# Patient Record
Sex: Female | Born: 1950 | Race: Black or African American | Hispanic: No | Smoking: Former smoker
Health system: Southern US, Community
[De-identification: ages and names within clinical notes are randomized; demographics above are authoritative.]

## PROBLEM LIST (undated history)

## (undated) DIAGNOSIS — Z972 Presence of dental prosthetic device (complete) (partial): Secondary | ICD-10-CM

## (undated) DIAGNOSIS — Z973 Presence of spectacles and contact lenses: Secondary | ICD-10-CM

## (undated) DIAGNOSIS — R911 Solitary pulmonary nodule: Secondary | ICD-10-CM

## (undated) DIAGNOSIS — E785 Hyperlipidemia, unspecified: Secondary | ICD-10-CM

## (undated) DIAGNOSIS — I2510 Atherosclerotic heart disease of native coronary artery without angina pectoris: Secondary | ICD-10-CM

## (undated) DIAGNOSIS — L814 Other melanin hyperpigmentation: Secondary | ICD-10-CM

## (undated) DIAGNOSIS — D649 Anemia, unspecified: Secondary | ICD-10-CM

## (undated) DIAGNOSIS — I10 Essential (primary) hypertension: Secondary | ICD-10-CM

## (undated) HISTORY — DX: Hyperlipidemia, unspecified: E78.5

## (undated) HISTORY — PX: FOOT SURGERY: SHX648

## (undated) HISTORY — PX: ABDOMINAL HYSTERECTOMY: SHX81

## (undated) HISTORY — PX: MULTIPLE TOOTH EXTRACTIONS: SHX2053

## (undated) HISTORY — DX: Essential (primary) hypertension: I10

## (undated) HISTORY — PX: COLONOSCOPY W/ BIOPSIES AND POLYPECTOMY: SHX1376

## (undated) HISTORY — PX: DILATION AND CURETTAGE OF UTERUS: SHX78

## (undated) HISTORY — DX: Atherosclerotic heart disease of native coronary artery without angina pectoris: I25.10

---

## 1972-04-15 HISTORY — PX: ABDOMINAL HYSTERECTOMY: SHX81

## 1999-12-31 ENCOUNTER — Other Ambulatory Visit: Admission: RE | Admit: 1999-12-31 | Discharge: 1999-12-31 | Payer: Self-pay | Admitting: Internal Medicine

## 2000-01-26 ENCOUNTER — Emergency Department (HOSPITAL_COMMUNITY): Admission: EM | Admit: 2000-01-26 | Discharge: 2000-01-27 | Payer: Self-pay | Admitting: Emergency Medicine

## 2000-11-03 ENCOUNTER — Other Ambulatory Visit: Admission: RE | Admit: 2000-11-03 | Discharge: 2000-11-03 | Payer: Self-pay | Admitting: Internal Medicine

## 2004-02-07 ENCOUNTER — Emergency Department (HOSPITAL_COMMUNITY): Admission: EM | Admit: 2004-02-07 | Discharge: 2004-02-07 | Payer: Self-pay | Admitting: Emergency Medicine

## 2005-02-16 ENCOUNTER — Emergency Department (HOSPITAL_COMMUNITY): Admission: EM | Admit: 2005-02-16 | Discharge: 2005-02-16 | Payer: Self-pay | Admitting: Emergency Medicine

## 2007-11-21 ENCOUNTER — Emergency Department (HOSPITAL_COMMUNITY): Admission: EM | Admit: 2007-11-21 | Discharge: 2007-11-21 | Payer: Self-pay | Admitting: Emergency Medicine

## 2008-01-11 ENCOUNTER — Encounter: Admission: RE | Admit: 2008-01-11 | Discharge: 2008-01-11 | Payer: Self-pay | Admitting: Internal Medicine

## 2009-07-14 ENCOUNTER — Ambulatory Visit (HOSPITAL_COMMUNITY): Admission: RE | Admit: 2009-07-14 | Discharge: 2009-07-14 | Payer: Self-pay | Admitting: Internal Medicine

## 2010-06-14 ENCOUNTER — Inpatient Hospital Stay (INDEPENDENT_AMBULATORY_CARE_PROVIDER_SITE_OTHER)
Admission: RE | Admit: 2010-06-14 | Discharge: 2010-06-14 | Disposition: A | Discharge status: Home / Self Care | Payer: Self-pay | Source: Ambulatory Visit | Attending: Emergency Medicine | Admitting: Emergency Medicine

## 2010-06-14 DIAGNOSIS — N3000 Acute cystitis without hematuria: Secondary | ICD-10-CM

## 2010-06-14 LAB — POCT URINALYSIS DIPSTICK
Ketones, ur: NEGATIVE mg/dL
Nitrite: NEGATIVE
Protein, ur: NEGATIVE mg/dL
Specific Gravity, Urine: <=1.005 (ref 1.005–1.030)
Urine Glucose, Fasting: NEGATIVE mg/dL
Urobilinogen, UA: 0.2 mg/dL (ref 0.0–1.0)
pH: 5.5 (ref 5.0–8.0)

## 2011-09-11 ENCOUNTER — Ambulatory Visit (INDEPENDENT_AMBULATORY_CARE_PROVIDER_SITE_OTHER): Payer: Self-pay | Admitting: Family Medicine

## 2011-09-11 ENCOUNTER — Encounter: Payer: Self-pay | Admitting: Family Medicine

## 2011-09-11 VITALS — BP 159/87 | HR 67 | Temp 98.1°F | Ht 65.75 in | Wt 173.9 lb

## 2011-09-11 DIAGNOSIS — I10 Essential (primary) hypertension: Secondary | ICD-10-CM | POA: Insufficient documentation

## 2011-09-11 DIAGNOSIS — H9190 Unspecified hearing loss, unspecified ear: Secondary | ICD-10-CM | POA: Insufficient documentation

## 2011-09-11 DIAGNOSIS — F17200 Nicotine dependence, unspecified, uncomplicated: Secondary | ICD-10-CM

## 2011-09-11 DIAGNOSIS — Z5111 Encounter for antineoplastic chemotherapy: Secondary | ICD-10-CM | POA: Insufficient documentation

## 2011-09-11 DIAGNOSIS — Z720 Tobacco use: Secondary | ICD-10-CM

## 2011-09-11 DIAGNOSIS — Z0000 Encounter for general adult medical examination without abnormal findings: Secondary | ICD-10-CM | POA: Insufficient documentation

## 2011-09-11 MED ORDER — LISINOPRIL 10 MG PO TABS: 5.0000 mg | ORAL_TABLET | Freq: Every day | ORAL | Status: DC

## 2011-09-11 NOTE — Assessment & Plan Note (Signed)
Advised ent/audiology referral.  No evidence for intracranial etiology.  Patient declines as is uninsured.

## 2011-09-11 NOTE — Assessment & Plan Note (Signed)
Not interested in quitting at this time

## 2011-09-11 NOTE — Assessment & Plan Note (Signed)
Manual recheck at goal.  Will  Continue lisinopril 5 mg as she has been taking. Will check labs as soon as debra hill approved.

## 2011-09-11 NOTE — Patient Instructions (Addendum)
See info for getting mammogram  Consider no more than 2 alcoholic drinks in a sitting  [phone] QUIT-NOW or schedule appt with Dr. Raymondo Band for smoking cessation  Make appointment for fasting bloodwork so we can check your cholesterol, test for diabetes  Follow-up every 6 months

## 2011-09-11 NOTE — Progress Notes (Signed)
  Subjective:    Patient ID: Brandi Holmes, female    DOB: October 27, 1950, 61 y.o.   MRN: [id number]  HPInew patient, here to establish care of hypertension.  Appt with Jaynee Eagles pending  Hearing loss:  Several months of decreased hearing unilaterally.  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  Sounds muffled- no tinnitus.  No ear drainage. No history of ear surgery.  No loud noise jobs or exposure.  HYPERTENSION  BP Readings from Last 3 Encounters:  09/11/11 159/87    Hypertension ROS: taking medications as instructed, no medication side effects noted, no TIA's, no chest pain on exertion, no dyspnea on exertion, no swelling of ankles and no intermittent claudication symptoms.     Review of Systems See HPI   Patient Information Form: Screening and ROS  AUDIT-C Score: 3, counseled on healthy levels of alcohol use. Do you feel safe in relationships? yes PHQ-2:negative  Review of Symptoms  General:  Negative for nexplained weight loss, fever Skin: Negative for new or changing mole, sore that won't heal HEENT: Negative for trouble hearing, trouble seeing, ringing in ears, mouth sores, hoarseness, change in voice, dysphagia. CV:  Negative for chest pain, dyspnea, edema, palpitations Resp: Negative for cough, dyspnea, hemoptysis GI: Negative for nausea, vomiting, diarrhea, constipation, abdominal pain, melena, hematochezia. GU: Negative for dysuria, incontinence, urinary hesitance, hematuria, vaginal or penile discharge, polyuria, sexual difficulty, lumps in testicle or breasts MSK: Negative for muscle cramps or aches, joint pain or swelling Neuro: Negative for headaches, weakness, numbness, dizziness, passing out/fainting Psych: Negative for depression, anxiety, memory problems  Positive for polyuria, decreased hearing Objective:   Physical Exam GEN: Alert & Oriented, No acute distress HEENT: [place]/AT.  EOMI, PERRLA, no conjunctival injection or scleral icterus.  Bilateral tympanic membranes intact without erythema or effusion.  .  Nares without edema or rhinorrhea.  Oropharynx is without erythema or exudates.  No anterior or posterior cervical lymphadenopathy. CV:  Regular Rate & Rhythm, no murmur Respiratory:  Normal work of breathing, CTAB Abd:  + BS, soft, no tenderness to palpation Ext: no pre-tibial edema  unabel to hear rubbing fingers behind her head.  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       Assessment & Plan:

## 2011-09-16 ENCOUNTER — Other Ambulatory Visit: Payer: Self-pay

## 2011-09-16 DIAGNOSIS — I10 Essential (primary) hypertension: Secondary | ICD-10-CM

## 2011-09-16 DIAGNOSIS — D649 Anemia, unspecified: Secondary | ICD-10-CM

## 2011-09-16 LAB — COMPREHENSIVE METABOLIC PANEL
ALT: 9 U/L (ref 0–35)
AST: 13 U/L (ref 0–37)
Albumin: 4 g/dL (ref 3.5–5.2)
Alkaline Phosphatase: 50 U/L (ref 39–117)
BUN: 11 mg/dL (ref 6–23)
CO2: 27 mEq/L (ref 19–32)
Calcium: 9.3 mg/dL (ref 8.4–10.5)
Creat: 0.91 mg/dL (ref 0.50–1.10)
Glucose, Bld: 95 mg/dL (ref 70–99)
Potassium: 4.4 mEq/L (ref 3.5–5.3)
Sodium: 142 mEq/L (ref 135–145)
Total Bilirubin: 0.8 mg/dL (ref 0.3–1.2)

## 2011-09-16 LAB — CBC
HCT: 39.2 % (ref 36.0–46.0)
Hemoglobin: 13.1 g/dL (ref 12.0–15.0)
MCH: 28.9 pg (ref 26.0–34.0)
MCHC: 33.4 g/dL (ref 30.0–36.0)
MCV: 86.3 fL (ref 78.0–100.0)
Platelets: 309 K/uL (ref 150–400)
RBC: 4.54 MIL/uL (ref 3.87–5.11)
RDW: 13.5 % (ref 11.5–15.5)
WBC: 11 K/uL — ABNORMAL HIGH (ref 4.0–10.5)

## 2011-09-16 LAB — LIPID PANEL
Cholesterol: 165 mg/dL (ref 0–200)
HDL: 45 mg/dL
LDL Cholesterol: 103 mg/dL — ABNORMAL HIGH (ref 0–99)
Total CHOL/HDL Ratio: 3.7 ratio
Triglycerides: 84 mg/dL
VLDL: 17 mg/dL (ref 0–40)

## 2011-09-16 NOTE — Progress Notes (Signed)
CBC,CMP AND FLP DONE TODAY Brandi Holmes 

## 2011-09-23 ENCOUNTER — Encounter: Payer: Self-pay | Admitting: Family Medicine

## 2011-09-23 DIAGNOSIS — D649 Anemia, unspecified: Secondary | ICD-10-CM | POA: Insufficient documentation

## 2011-09-23 NOTE — Assessment & Plan Note (Signed)
Per patient recent normal colonoscopy,  Mammogram pending.  Will continue to follow

## 2013-06-30 ENCOUNTER — Other Ambulatory Visit (HOSPITAL_COMMUNITY): Payer: Self-pay | Admitting: Internal Medicine

## 2013-06-30 DIAGNOSIS — Z1231 Encounter for screening mammogram for malignant neoplasm of breast: Secondary | ICD-10-CM

## 2013-07-02 ENCOUNTER — Ambulatory Visit (HOSPITAL_COMMUNITY): Payer: Medicare HMO

## 2013-08-12 ENCOUNTER — Encounter (INDEPENDENT_AMBULATORY_CARE_PROVIDER_SITE_OTHER): Payer: Self-pay

## 2013-08-12 ENCOUNTER — Ambulatory Visit (HOSPITAL_COMMUNITY)
Admission: RE | Admit: 2013-08-12 | Discharge: 2013-08-12 | Disposition: A | Discharge status: Home / Self Care | Payer: Medicare HMO | Source: Ambulatory Visit | Attending: Internal Medicine | Admitting: Internal Medicine

## 2013-08-12 DIAGNOSIS — Z1231 Encounter for screening mammogram for malignant neoplasm of breast: Secondary | ICD-10-CM

## 2014-06-27 ENCOUNTER — Emergency Department (INDEPENDENT_AMBULATORY_CARE_PROVIDER_SITE_OTHER)
Admission: EM | Admit: 2014-06-27 | Discharge: 2014-06-27 | Disposition: A | Discharge status: Home / Self Care | Payer: Commercial Managed Care - HMO | Source: Home / Self Care | Attending: Family Medicine | Admitting: Family Medicine

## 2014-06-27 ENCOUNTER — Encounter (HOSPITAL_COMMUNITY): Payer: Self-pay | Admitting: Emergency Medicine

## 2014-06-27 DIAGNOSIS — W57XXXA Bitten or stung by nonvenomous insect and other nonvenomous arthropods, initial encounter: Secondary | ICD-10-CM

## 2014-06-27 DIAGNOSIS — T148 Other injury of unspecified body region: Secondary | ICD-10-CM

## 2014-06-27 MED ORDER — DOXYCYCLINE HYCLATE 100 MG PO CAPS: 100.0000 mg | ORAL_CAPSULE | Freq: Two times a day (BID) | ORAL | Status: DC

## 2014-06-27 NOTE — Discharge Instructions (Signed)
Thank you for coming in today.  Tick Bite Information Ticks are insects that attach themselves to the skin and draw blood for food. There are various types of ticks. Common types include wood ticks and deer ticks. Most ticks live in shrubs and grassy areas. Ticks can climb onto your body when you make contact with leaves or grass where the tick is waiting. The most common places on the body for ticks to attach themselves are the scalp, neck, armpits, waist, and groin. Most tick bites are harmless, but sometimes ticks carry germs that cause diseases. These germs can be spread to a person during the tick's feeding process. The chance of a disease spreading through a tick bite depends on:   The type of tick.  Time of year.   How long the tick is attached.   Geographic location.  HOW CAN YOU PREVENT TICK BITES? Take these steps to help prevent tick bites when you are outdoors:  Wear protective clothing. Long sleeves and long pants are best.   Wear white clothes so you can see ticks more easily.  Tuck your pant legs into your socks.   If walking on a trail, stay in the middle of the trail to avoid brushing against bushes.  Avoid walking through areas with long grass.  Put insect repellent on all exposed skin and along boot tops, pant legs, and sleeve cuffs.   Check clothing, hair, and skin repeatedly and before going inside.   Brush off any ticks that are not attached.  Take a shower or bath as soon as possible after being outdoors.  WHAT IS THE PROPER WAY TO REMOVE A TICK? Ticks should be removed as soon as possible to help prevent diseases caused by tick bites. 1. If latex gloves are available, put them on before trying to remove a tick.  2. Using fine-point tweezers, grasp the tick as close to the skin as possible. You may also use curved forceps or a tick removal tool. Grasp the tick as close to its head as possible. Avoid grasping the tick on its body. 3. Pull gently  with steady upward pressure until the tick lets go. Do not twist the tick or jerk it suddenly. This may break off the tick's head or mouth parts. 4. Do not squeeze or crush the tick's body. This could force disease-carrying fluids from the tick into your body.  5. After the tick is removed, wash the bite area and your hands with soap and water or other disinfectant such as alcohol. 6. Apply a small amount of antiseptic cream or ointment to the bite site.  7. Wash and disinfect any instruments that were used.  Do not try to remove a tick by applying a hot match, petroleum jelly, or fingernail polish to the tick. These methods do not work and may increase the chances of disease being spread from the tick bite.  WHEN SHOULD YOU SEEK MEDICAL CARE? Contact your health care provider if you are unable to remove a tick from your skin or if a part of the tick breaks off and is stuck in the skin.  After a tick bite, you need to be aware of signs and symptoms that could be related to diseases spread by ticks. Contact your health care provider if you develop any of the following in the days or weeks after the tick bite:  Unexplained fever.  Rash. A circular rash that appears days or weeks after the tick bite may indicate the possibility  of Lyme disease. The rash may resemble a target with a bull's-eye and may occur at a different part of your body than the tick bite.  Redness and swelling in the area of the tick bite.   Tender, swollen lymph glands.   Diarrhea.   Weight loss.   Cough.   Fatigue.   Muscle, joint, or bone pain.   Abdominal pain.   Headache.   Lethargy or a change in your level of consciousness.  Difficulty walking or moving your legs.   Numbness in the legs.   Paralysis.  Shortness of breath.   Confusion.   Repeated vomiting.  Document Released: 03/29/2000 Document Revised: 01/20/2013 Document Reviewed: 09/09/2012 [facility] Patient Information 2015  [place], [place]. This information is not intended to replace advice given to you by your health care provider. Make sure you discuss any questions you have with your health care provider.

## 2014-06-27 NOTE — ED Notes (Signed)
Pt has a tick on her mid back that needs to be removed.  Pt denies any concerning symptoms.

## 2014-06-27 NOTE — ED Provider Notes (Signed)
Brandi Holmes is a 64 y.o. female who presents to Urgent Care today for tick bite. Patient discovered a tick on her back today. She thinks she was exposed Friday when she was tending to some [place]. She denies any fevers or chills vomiting or diarrhea. She's tried putting rubbing alcohol on the tach which did not help.   Past Medical History  Diagnosis Date  . Hypertension    Past Surgical History  Procedure Laterality Date  . Abdominal hysterectomy     History  Substance Use Topics  . Smoking status: Light Tobacco Smoker  . Smokeless tobacco: Never Used  . Alcohol Use: Yes     Comment: occasional   ROS as above Medications: No current facility-administered medications for this encounter.   Current Outpatient Prescriptions  Medication Sig Dispense Refill  . lisinopril (PRINIVIL,ZESTRIL) 10 MG tablet Take 10 mg by mouth daily.    Marland Kitchen doxycycline (VIBRAMYCIN) 100 MG capsule Take 1 capsule (100 mg total) by mouth 2 (two) times daily. 14 capsule 0   No Known Allergies   Exam:  BP 156/91 mmHg  Pulse 62  Temp(Src) 98.4 F (36.9 C) (Oral)  Resp 16  SpO2 100% Gen: Well NAD Skin: Firmly attached Dermacentor tick.   The tick was removed however a small amount of the mouthpieces remained embedded in the skin and were unable to be removed.  No results found for this or any previous visit (from the past 24 hour(s)). No results found.  Assessment and Plan: 64 y.o. female with tic status post removal. Treat with doxycycline. Return as needed.  Discussed warning signs or symptoms. Please see discharge instructions. Patient expresses understanding.     Gregor Hams, MD 06/27/14 [phone]

## 2014-09-20 DIAGNOSIS — E2839 Other primary ovarian failure: Secondary | ICD-10-CM | POA: Diagnosis not present

## 2014-09-20 DIAGNOSIS — I10 Essential (primary) hypertension: Secondary | ICD-10-CM | POA: Diagnosis not present

## 2014-09-20 DIAGNOSIS — Z0000 Encounter for general adult medical examination without abnormal findings: Secondary | ICD-10-CM | POA: Diagnosis not present

## 2014-09-27 ENCOUNTER — Other Ambulatory Visit (HOSPITAL_COMMUNITY): Payer: Self-pay | Admitting: Family Medicine

## 2014-09-27 DIAGNOSIS — Z1231 Encounter for screening mammogram for malignant neoplasm of breast: Secondary | ICD-10-CM

## 2014-10-03 ENCOUNTER — Other Ambulatory Visit (HOSPITAL_COMMUNITY): Payer: Self-pay | Admitting: Family Medicine

## 2014-10-05 ENCOUNTER — Ambulatory Visit (HOSPITAL_COMMUNITY)
Admission: RE | Admit: 2014-10-05 | Discharge: 2014-10-05 | Disposition: A | Discharge status: Home / Self Care | Payer: Commercial Managed Care - HMO | Source: Ambulatory Visit | Attending: Family Medicine | Admitting: Family Medicine

## 2014-10-05 DIAGNOSIS — M810 Age-related osteoporosis without current pathological fracture: Secondary | ICD-10-CM | POA: Diagnosis not present

## 2014-10-05 DIAGNOSIS — Z1231 Encounter for screening mammogram for malignant neoplasm of breast: Secondary | ICD-10-CM | POA: Diagnosis not present

## 2014-11-02 ENCOUNTER — Encounter: Payer: Self-pay | Admitting: Family Medicine

## 2015-01-31 ENCOUNTER — Other Ambulatory Visit: Payer: Self-pay | Admitting: Family Medicine

## 2015-01-31 ENCOUNTER — Other Ambulatory Visit (HOSPITAL_COMMUNITY)
Admission: RE | Admit: 2015-01-31 | Discharge: 2015-01-31 | Disposition: A | Discharge status: Home / Self Care | Payer: Commercial Managed Care - HMO | Source: Ambulatory Visit | Attending: Family Medicine | Admitting: Family Medicine

## 2015-01-31 DIAGNOSIS — Z124 Encounter for screening for malignant neoplasm of cervix: Secondary | ICD-10-CM | POA: Insufficient documentation

## 2015-01-31 DIAGNOSIS — N898 Other specified noninflammatory disorders of vagina: Secondary | ICD-10-CM | POA: Diagnosis not present

## 2015-02-02 LAB — CYTOLOGY - PAP

## 2015-03-20 ENCOUNTER — Ambulatory Visit (INDEPENDENT_AMBULATORY_CARE_PROVIDER_SITE_OTHER): Payer: Commercial Managed Care - HMO

## 2015-03-20 ENCOUNTER — Ambulatory Visit (INDEPENDENT_AMBULATORY_CARE_PROVIDER_SITE_OTHER): Payer: Commercial Managed Care - HMO | Admitting: Podiatry

## 2015-03-20 DIAGNOSIS — R6889 Other general symptoms and signs: Secondary | ICD-10-CM | POA: Diagnosis not present

## 2015-03-20 DIAGNOSIS — M21619 Bunion of unspecified foot: Secondary | ICD-10-CM | POA: Diagnosis not present

## 2015-03-20 DIAGNOSIS — L03031 Cellulitis of right toe: Secondary | ICD-10-CM | POA: Diagnosis not present

## 2015-03-20 DIAGNOSIS — L600 Ingrowing nail: Secondary | ICD-10-CM

## 2015-03-20 DIAGNOSIS — L03011 Cellulitis of right finger: Secondary | ICD-10-CM

## 2015-03-20 NOTE — Patient Instructions (Signed)

## 2015-03-20 NOTE — Progress Notes (Signed)
   Subjective:    Patient ID: Brandi Holmes, female    DOB: Aug 24, 1950, 64 y.o.   MRN: [id number]  HPI Pt presents with painful right hallux nail medial border. Nail was damaged/injured when pallate fell on it, severe pain since   Review of Systems  All other systems reviewed and are negative.      Objective:   Physical Exam        Assessment & Plan:

## 2015-03-21 NOTE — Progress Notes (Signed)
Subjective:     Patient ID: Brandi Holmes, female   DOB: 1950-06-05, 64 y.o.   MRN: [id number]  HPI patient presents with painful nail right hallux on the inside stating that she dropped something on it and it's been sore with mild drainage. Also complains of bunion deformity right and had had the left one fixed   Review of Systems  All other systems reviewed and are negative.      Objective:   Physical Exam  Constitutional: She is oriented to person, place, and time.  Cardiovascular: Intact distal pulses.   Musculoskeletal: Normal range of motion.  Neurological: She is oriented to person, place, and time.  Skin: Skin is warm.  Nursing note and vitals reviewed.  neurovascular status found to be intact with muscle strength adequate range of motion within normal limits with patient noted to have incurvated right hallux medial border that's painful with distal redness and is found to have hyperostosis medial aspect first metatarsal head right that's very painful when pressed with redness and states that she's tried wider shoes she's tried padding this and soaking it without relief. Have the left one fixed which is doing well     Assessment:     Paronychia infection right and structural bunion deformity right    Plan:     H&P conditions reviewed with patient and we will work on the nail today. I infiltrated 60 Milligan times like Marcaine mixture removed the medial border proud flesh abscess tissue and allowed channel for drainage and instructed on soaks. Discussed bunion it would be best corrected but she needs to wait until May when she returns from her trip  X-ray report indicated elevation of the intermetatarsal angle between the first and second metatarsals of 16 with deviation the hallux against second toe and tibial sesamoidal shift.

## 2015-03-27 ENCOUNTER — Telehealth: Payer: Self-pay | Admitting: *Deleted

## 2015-03-27 NOTE — Telephone Encounter (Signed)
Left message for patient at [phone] (Cell #) to check to see how they were doing from their ingrown toenail procedure that was performed on Monday, March 20, 2015. Waiting for a response.

## 2015-05-09 DIAGNOSIS — H6690 Otitis media, unspecified, unspecified ear: Secondary | ICD-10-CM | POA: Diagnosis not present

## 2015-05-09 DIAGNOSIS — J029 Acute pharyngitis, unspecified: Secondary | ICD-10-CM | POA: Diagnosis not present

## 2015-05-09 DIAGNOSIS — E559 Vitamin D deficiency, unspecified: Secondary | ICD-10-CM | POA: Diagnosis not present

## 2015-05-09 DIAGNOSIS — Z1159 Encounter for screening for other viral diseases: Secondary | ICD-10-CM | POA: Diagnosis not present

## 2015-08-18 ENCOUNTER — Telehealth: Payer: Self-pay | Admitting: *Deleted

## 2015-08-18 NOTE — Telephone Encounter (Signed)
"  I'm scheduled for surgery on Monday with Dr. Paulla Dolly for a bunionectomy.  I am going to need to cancel that I'm headed out of town again.  I would like to get my cost for surgery.  I have [employer] and I don't see it on my list that Bunionectomy is covered.  I need to know how much I will have to pay."  You're not scheduled for surgery on Monday.  It's for a consultation with Dr. Paulla Dolly.  Bunionectomy surgery is done at [facility].  I will see if Jocelyn Lamer in insurance can give you a call back regarding this with an estimate.

## 2015-08-21 ENCOUNTER — Ambulatory Visit: Payer: Commercial Managed Care - HMO | Admitting: Podiatry

## 2016-07-29 ENCOUNTER — Other Ambulatory Visit: Payer: Self-pay | Admitting: Family Medicine

## 2016-07-29 ENCOUNTER — Ambulatory Visit
Admission: RE | Admit: 2016-07-29 | Discharge: 2016-07-29 | Disposition: A | Discharge status: Home / Self Care | Payer: Medicare HMO | Source: Ambulatory Visit | Attending: Family Medicine | Admitting: Family Medicine

## 2016-07-29 DIAGNOSIS — F17200 Nicotine dependence, unspecified, uncomplicated: Secondary | ICD-10-CM

## 2016-07-29 DIAGNOSIS — R0602 Shortness of breath: Secondary | ICD-10-CM

## 2018-11-10 ENCOUNTER — Other Ambulatory Visit: Payer: Self-pay | Admitting: Family Medicine

## 2018-11-10 DIAGNOSIS — R102 Pelvic and perineal pain: Secondary | ICD-10-CM

## 2018-11-16 ENCOUNTER — Ambulatory Visit
Admission: RE | Admit: 2018-11-16 | Discharge: 2018-11-16 | Disposition: A | Discharge status: Home / Self Care | Payer: Medicare HMO | Source: Ambulatory Visit | Attending: Family Medicine | Admitting: Family Medicine

## 2018-11-16 DIAGNOSIS — R102 Pelvic and perineal pain: Secondary | ICD-10-CM

## 2019-06-23 ENCOUNTER — Other Ambulatory Visit: Payer: Self-pay | Admitting: Family Medicine

## 2019-06-23 ENCOUNTER — Ambulatory Visit
Admission: RE | Admit: 2019-06-23 | Discharge: 2019-06-23 | Disposition: A | Discharge status: Home / Self Care | Payer: Medicare HMO | Source: Ambulatory Visit | Attending: Family Medicine | Admitting: Family Medicine

## 2019-06-23 DIAGNOSIS — M25512 Pain in left shoulder: Secondary | ICD-10-CM

## 2019-06-23 DIAGNOSIS — M25531 Pain in right wrist: Secondary | ICD-10-CM

## 2019-06-25 ENCOUNTER — Other Ambulatory Visit: Payer: Self-pay | Admitting: Specialist

## 2019-06-25 ENCOUNTER — Encounter: Payer: Self-pay | Admitting: Family Medicine

## 2019-06-25 DIAGNOSIS — M542 Cervicalgia: Secondary | ICD-10-CM

## 2019-07-05 ENCOUNTER — Other Ambulatory Visit: Payer: Self-pay | Admitting: Specialist

## 2019-07-05 DIAGNOSIS — M25512 Pain in left shoulder: Secondary | ICD-10-CM

## 2019-07-07 NOTE — Progress Notes (Signed)
Erroneous

## 2019-07-08 ENCOUNTER — Encounter: Payer: Medicare HMO | Admitting: Cardiology

## 2019-07-09 ENCOUNTER — Other Ambulatory Visit: Payer: Medicare HMO

## 2019-07-13 ENCOUNTER — Encounter: Payer: Self-pay | Admitting: Cardiology

## 2019-07-15 ENCOUNTER — Encounter: Payer: Self-pay | Admitting: Cardiology

## 2019-07-15 ENCOUNTER — Other Ambulatory Visit: Payer: Self-pay

## 2019-07-15 ENCOUNTER — Ambulatory Visit: Payer: Medicare HMO | Admitting: Cardiology

## 2019-07-15 VITALS — BP 178/69 | HR 74 | Temp 97.7°F | Ht 65.75 in | Wt 198.0 lb

## 2019-07-15 DIAGNOSIS — Z87891 Personal history of nicotine dependence: Secondary | ICD-10-CM

## 2019-07-15 DIAGNOSIS — E6609 Other obesity due to excess calories: Secondary | ICD-10-CM

## 2019-07-15 DIAGNOSIS — I10 Essential (primary) hypertension: Secondary | ICD-10-CM

## 2019-07-15 MED ORDER — AMLODIPINE BESYLATE 5 MG PO TABS: 5.0000 mg | ORAL_TABLET | Freq: Every evening | ORAL | 0 refills | Status: DC

## 2019-07-15 NOTE — Patient Instructions (Addendum)
Please keep a blood pressure log and bring that in at  your next office visit. Call the office if the top number is consistently greater than 119mmHg.   Please remember to bring in your medication bottles in at the next visit.   Increase lisinopril to 20 mg in the morning.  Add Norvasc 5 mg po every p.m.  Office will call you to have the following tests scheduled:  Echo  Please get labs done in about 1 week after increasing lisinopril at the nearest [place].  Recommend follow up with your PCP as scheduled.

## 2019-07-15 NOTE — Progress Notes (Addendum)
REASON FOR CONSULT: Hypertension  Chief Complaint  Patient presents with  . Hypertension    New Patient     REQUESTING PHYSICIAN:  Antony Blackbird, MD [place],  [place] [address]  HPI  Brandi Holmes is a 69 y.o. female who presents to the office with a chief complaint of " high blood pressure." Patient's past medical history and cardiac risk factors include: Hypertension, former smoker, postmenopausal female, advanced age, obesity.  Patient is referred to the office at the request of her primary care provider for management of hypertension.  Hypertension: Patient states that she was diagnosed with high blood pressure approximately 15 years ago.  She is currently on lisinopril.  Patient does check her blood pressures at home.  Patient states that she tries to follow a low-salt diet.  She does not participate in any structured exercise program or daily routine.   Currently patient denies chest pain, shortness of breath at rest or effort related symptoms, lightheadedness, dizziness, palpitations, orthopnea, paroxysmal nocturnal dyspnea, lower extremity swelling, near syncope, syncopal events, hematochezia, hemoptysis, hematemesis, melanotic stools, no symptoms of amaurosis fugax, motor or sensory symptoms or dysphasia in the last 6 months.   No premature coronary artery disease in the family.  Mom had a myocardial infarction at the age of 38 and subsequently underwent CABG.  Her daughter passed away at the age of 28 after undergoing gastric bypass surgery and on autopsy it noted the reason of death to be acute arrhythmia.  She does not know any additional details in regards to what type of arrhythmia or she had any predisposing cardiac conditions.  No recent hospitalization.   No prior cardiac work-up in the recent past.  Denies prior history of coronary artery disease, myocardial infarction, congestive heart failure, deep venous thrombosis, pulmonary embolism,  stroke, transient ischemic attack.  FUNCTIONAL STATUS: No structured exercise program or daily routine.    ALLERGIES: No Known Allergies   MEDICATION LIST PRIOR TO VISIT: Current Outpatient Medications on File Prior to Visit  Medication Sig Dispense Refill  . acetaminophen (TYLENOL) 500 MG tablet Take 500 mg by mouth every 6 (six) hours as needed.    Marland Kitchen lisinopril (PRINIVIL,ZESTRIL) 10 MG tablet Take 20 mg by mouth in the morning.    . magnesium 30 MG tablet Take 30 mg by mouth 2 (two) times daily.    . Multiple Vitamins-Minerals (MULTIVITAMIN WITH MINERALS) tablet Take 1 tablet by mouth daily.    . pseudoephedrine-acetaminophen (TYLENOL SINUS) 30-500 MG TABS tablet Take 1 tablet by mouth every 6 (six) hours as needed.     No current facility-administered medications on file prior to visit.    PAST MEDICAL HISTORY: Past Medical History:  Diagnosis Date  . Hypertension     PAST SURGICAL HISTORY: Past Surgical History:  Procedure Laterality Date  . ABDOMINAL HYSTERECTOMY  1974   heavy bleeding  . ABDOMINAL HYSTERECTOMY    . FOOT SURGERY     bilateral bunions and hammer toes    FAMILY HISTORY: The patient family history includes Cancer in her maternal aunt and paternal aunt; Heart disease (age of onset: 74) in her mother; Hypertension in her mother.   SOCIAL HISTORY:  The patient  reports that she has quit smoking. She has never used smokeless tobacco. She reports current alcohol use. She reports that she does not use drugs.  14 ORGAN REVIEW OF SYSTEMS: CONSTITUTIONAL: No fever or significant weight loss EYES: No recent significant visual change EARS, NOSE,  MOUTH, THROAT: No recent significant change in hearing CARDIOVASCULAR: See discussion in subjective/HPI RESPIRATORY: See discussion in subjective/HPI GASTROINTESTINAL: No recent complaints of abdominal pain GENITOURINARY: No recent significant change in genitourinary status MUSCULOSKELETAL: No recent significant  change in musculoskeletal status INTEGUMENTARY: No recent rash NEUROLOGIC: No recent significant change in motor function PSYCHIATRIC: No recent significant change in mood ENDOCRINOLOGIC: No recent significant change in endocrine status HEMATOLOGIC/LYMPHATIC: No recent significant unexpected bruising ALLERGIC/IMMUNOLOGIC: No recent unexplained allergic reaction  PHYSICAL EXAM: Vitals with BMI 07/15/2019 06/27/2014 09/11/2011  Height 5' 5.75" - 5' 5.75"  Weight 198 lbs - 173 lbs 14 oz  BMI 16.1 - 09.6  Systolic 045 409 811  Diastolic 69 91 87  Pulse 74 62 67   CONSTITUTIONAL: Well-developed and well-nourished. No acute distress.  SKIN: Skin is warm and dry. No rash noted. No cyanosis. No pallor. No jaundice HEAD: Normocephalic and atraumatic.  EYES: No scleral icterus MOUTH/THROAT: Moist oral membranes.  NECK: No JVD present. No thyromegaly noted. No carotid bruits  LYMPHATIC: No visible cervical adenopathy.  CHEST Normal respiratory effort. No intercostal retractions  LUNGS: Clear to auscultation bilaterally.  No stridor. No wheezes. No rales.  CARDIOVASCULAR: Regular rate and rhythm, positive S1-S2, no murmurs rubs or gallops appreciated ABDOMINAL: No apparent ascites.  EXTREMITIES: No peripheral edema  HEMATOLOGIC: No significant bruising NEUROLOGIC: Oriented to person, place, and time. Nonfocal. Normal muscle tone.  PSYCHIATRIC: Normal mood and affect. Normal behavior. Cooperative  CARDIAC DATABASE: EKG: 07/15/2019: Normal sinus rhythm with a ventricular rate of 64 bpm, normal axis deviation, no underlying ischemia or injury pattern.  Echocardiogram: None  Stress Testing:  None  Heart Catheterization: None  LABORATORY DATA: CBC Latest Ref Rng & Units 09/16/2011  WBC 4.0 - 10.5 K/uL 11.0(H)  Hemoglobin 12.0 - 15.0 g/dL 13.1  Hematocrit 36.0 - 46.0 % 39.2  Platelets 150 - 400 K/uL 309    CMP Latest Ref Rng & Units 09/16/2011  Glucose 70 - 99 mg/dL 95  BUN 6 - 23 mg/dL  11  Creatinine 0.50 - 1.10 mg/dL 0.91  Sodium 135 - 145 mEq/L 142  Potassium 3.5 - 5.3 mEq/L 4.4  Chloride 96 - 112 mEq/L 108  CO2 19 - 32 mEq/L 27  Calcium 8.4 - 10.5 mg/dL 9.3  Total Protein 6.0 - 8.3 g/dL 6.7  Total Bilirubin 0.3 - 1.2 mg/dL 0.8  Alkaline Phos 39 - 117 U/L 50  AST 0 - 37 U/L 13  ALT 0 - 35 U/L 9    Lipid Panel     Component Value Date/Time   CHOL 165 09/16/2011 1000   TRIG 84 09/16/2011 1000   HDL 45 09/16/2011 1000   CHOLHDL 3.7 09/16/2011 1000   VLDL 17 09/16/2011 1000   LDLCALC 103 (H) 09/16/2011 1000    No results found for: HGBA1C No components found for: NTPROBNP No results found for: TSH  FINAL MEDICATION LIST END OF ENCOUNTER: Meds ordered this encounter  Medications  . amLODipine (NORVASC) 5 MG tablet    Sig: Take 1 tablet (5 mg total) by mouth every evening.    Dispense:  90 tablet    Refill:  0    Medications Discontinued During This Encounter  Medication Reason  . lisinopril (PRINIVIL,ZESTRIL) 10 MG tablet Duplicate     Current Outpatient Medications:  .  acetaminophen (TYLENOL) 500 MG tablet, Take 500 mg by mouth every 6 (six) hours as needed., Disp: , Rfl:  .  lisinopril (PRINIVIL,ZESTRIL) 10 MG tablet, Take  20 mg by mouth in the morning., Disp: , Rfl:  .  magnesium 30 MG tablet, Take 30 mg by mouth 2 (two) times daily., Disp: , Rfl:  .  Multiple Vitamins-Minerals (MULTIVITAMIN WITH MINERALS) tablet, Take 1 tablet by mouth daily., Disp: , Rfl:  .  pseudoephedrine-acetaminophen (TYLENOL SINUS) 30-500 MG TABS tablet, Take 1 tablet by mouth every 6 (six) hours as needed., Disp: , Rfl:  .  amLODipine (NORVASC) 5 MG tablet, Take 1 tablet (5 mg total) by mouth every evening., Disp: 90 tablet, Rfl: 0  IMPRESSION:    ICD-10-CM   1. Essential hypertension  I10 EKG 12-Lead    amLODipine (NORVASC) 5 MG tablet    Basic metabolic panel    PCV ECHOCARDIOGRAM COMPLETE  2. Former smoker  Z87.891   3. Class 1 obesity due to excess calories  without serious comorbidity with body mass index (BMI) of 32.0 to 32.9 in adult  E66.09    Z68.32      RECOMMENDATIONS: Akiya Morr is a 69 y.o. female whose past medical history and cardiac risk factors include: Hypertension, former smoker, postmenopausal female, advanced age, obesity.  Benign essential hypertension:  EKG illustrates normal sinus rhythm without underlying ischemia or injury pattern.  Patient is on one antihypertensive medication.  I have asked her to take her blood pressures at home and to bring the log in at the next office visit.  However, in the interim if she notices that her systolic blood pressures at home are consistently greater than 140 mmHg I have asked her to call the office sooner for further medication titration.  Echocardiogram will be ordered to evaluate for structural heart disease and left ventricular systolic function.  Add amlodipine 5 mg p.o. every afternoon  Low-salt diet.  Check BMP in 1 week to evaluate kidney function.  Former smoker: Educated on importance of continued smoking cessation.  Obesity, due to excess calories: Body mass index is 32.2 kg/m. . I reviewed with the patient the importance of diet, regular physical activity/exercise, weight loss.   . Patient is educated on increasing physical activity gradually as tolerated.  With the goal of moderate intensity exercise for 30 minutes a day 5 days a week.  Orders Placed This Encounter  Procedures  . Basic metabolic panel  . EKG 12-Lead  . PCV ECHOCARDIOGRAM COMPLETE   --Continue cardiac medications as reconciled in final medication list. --Return in about 4 weeks (around 08/12/2019) for echo, BP follow up . Or sooner if needed. --Continue follow-up with your primary care physician regarding the management of your other chronic comorbid conditions.  Patient's questions and concerns were addressed to her satisfaction. She voices understanding of the instructions provided  during this encounter.   This note was created using a voice recognition software as a result there may be grammatical errors inadvertently enclosed that do not reflect the nature of this encounter. Every attempt is made to correct such errors.  Rex Kras, DO, [facility]. [place] Office: [phone]

## 2019-07-20 ENCOUNTER — Ambulatory Visit
Admission: RE | Admit: 2019-07-20 | Discharge: 2019-07-20 | Disposition: A | Discharge status: Home / Self Care | Payer: Medicare HMO | Source: Ambulatory Visit | Attending: Specialist | Admitting: Specialist

## 2019-07-20 ENCOUNTER — Other Ambulatory Visit: Payer: Self-pay

## 2019-07-20 ENCOUNTER — Other Ambulatory Visit: Payer: Medicare HMO

## 2019-07-20 ENCOUNTER — Inpatient Hospital Stay: Admission: RE | Admit: 2019-07-20 | Payer: Medicare HMO | Source: Ambulatory Visit

## 2019-07-20 DIAGNOSIS — M542 Cervicalgia: Secondary | ICD-10-CM

## 2019-07-20 DIAGNOSIS — M25512 Pain in left shoulder: Secondary | ICD-10-CM

## 2019-07-20 MED ORDER — IOPAMIDOL (ISOVUE-300) INJECTION 61%
80.0000 mL | Freq: Once | INTRAVENOUS | Status: AC | PRN
  Administered 2019-07-20: 80 mL via INTRAVENOUS

## 2019-07-22 ENCOUNTER — Other Ambulatory Visit: Payer: Self-pay | Admitting: Cardiology

## 2019-07-23 LAB — BASIC METABOLIC PANEL
BUN/Creatinine Ratio: 15 (ref 12–28)
BUN: 13 mg/dL (ref 8–27)
CO2: 22 mmol/L (ref 20–29)
Calcium: 10.2 mg/dL (ref 8.7–10.3)
Chloride: 104 mmol/L (ref 96–106)
Creatinine, Ser: 0.87 mg/dL (ref 0.57–1.00)
GFR calc Af Amer: 78 mL/min/{1.73_m2} (ref >59)
GFR calc non Af Amer: 67 mL/min/{1.73_m2} (ref >59)
Glucose: 96 mg/dL (ref 65–99)
Potassium: 4.9 mmol/L (ref 3.5–5.2)
Sodium: 142 mmol/L (ref 134–144)

## 2019-07-25 ENCOUNTER — Emergency Department (HOSPITAL_COMMUNITY)
Admission: EM | Admit: 2019-07-25 | Discharge: 2019-07-26 | Disposition: A | Discharge status: Home / Self Care | Payer: Medicare HMO | Attending: Emergency Medicine | Admitting: Emergency Medicine

## 2019-07-25 ENCOUNTER — Encounter (HOSPITAL_COMMUNITY): Payer: Self-pay | Admitting: Emergency Medicine

## 2019-07-25 ENCOUNTER — Other Ambulatory Visit: Payer: Self-pay

## 2019-07-25 DIAGNOSIS — Z79899 Other long term (current) drug therapy: Secondary | ICD-10-CM | POA: Insufficient documentation

## 2019-07-25 DIAGNOSIS — M542 Cervicalgia: Secondary | ICD-10-CM | POA: Diagnosis not present

## 2019-07-25 DIAGNOSIS — I10 Essential (primary) hypertension: Secondary | ICD-10-CM | POA: Diagnosis not present

## 2019-07-25 DIAGNOSIS — R110 Nausea: Secondary | ICD-10-CM | POA: Diagnosis not present

## 2019-07-25 DIAGNOSIS — R1031 Right lower quadrant pain: Secondary | ICD-10-CM | POA: Diagnosis present

## 2019-07-25 LAB — URINALYSIS, ROUTINE W REFLEX MICROSCOPIC
Bacteria, UA: NONE SEEN
Bilirubin Urine: NEGATIVE
Glucose, UA: NEGATIVE mg/dL
Hgb urine dipstick: NEGATIVE
Ketones, ur: 5 mg/dL — AB
Leukocytes,Ua: NEGATIVE
Nitrite: NEGATIVE
Protein, ur: 30 mg/dL — AB
Specific Gravity, Urine: 1.018 (ref 1.005–1.030)
pH: 5 (ref 5.0–8.0)

## 2019-07-25 LAB — COMPREHENSIVE METABOLIC PANEL
ALT: 14 U/L (ref 0–44)
AST: 21 U/L (ref 15–41)
Albumin: 4.1 g/dL (ref 3.5–5.0)
Alkaline Phosphatase: 53 U/L (ref 38–126)
Anion gap: 12 (ref 5–15)
BUN: 10 mg/dL (ref 8–23)
CO2: 20 mmol/L — ABNORMAL LOW (ref 22–32)
Calcium: 9.6 mg/dL (ref 8.9–10.3)
Chloride: 102 mmol/L (ref 98–111)
Creatinine, Ser: 0.88 mg/dL (ref 0.44–1.00)
GFR calc Af Amer: >60 mL/min (ref >60)
GFR calc non Af Amer: >60 mL/min (ref >60)
Glucose, Bld: 118 mg/dL — ABNORMAL HIGH (ref 70–99)
Potassium: 4.3 mmol/L (ref 3.5–5.1)
Sodium: 134 mmol/L — ABNORMAL LOW (ref 135–145)
Total Bilirubin: 0.7 mg/dL (ref 0.3–1.2)
Total Protein: 8 g/dL (ref 6.5–8.1)

## 2019-07-25 LAB — CBC
HCT: 47.8 % — ABNORMAL HIGH (ref 36.0–46.0)
Hemoglobin: 14.9 g/dL (ref 12.0–15.0)
MCH: 28.8 pg (ref 26.0–34.0)
MCHC: 31.2 g/dL (ref 30.0–36.0)
MCV: 92.3 fL (ref 80.0–100.0)
Platelets: 352 10*3/uL (ref 150–400)
RBC: 5.18 MIL/uL — ABNORMAL HIGH (ref 3.87–5.11)
RDW: 12.4 % (ref 11.5–15.5)
WBC: 13.5 10*3/uL — ABNORMAL HIGH (ref 4.0–10.5)
nRBC: 0 % (ref 0.0–0.2)

## 2019-07-25 LAB — LIPASE, BLOOD: Lipase: 56 U/L — ABNORMAL HIGH (ref 11–51)

## 2019-07-25 MED ORDER — SODIUM CHLORIDE 0.9% FLUSH: 3.0000 mL | Freq: Once | INTRAVENOUS | Status: DC

## 2019-07-25 NOTE — ED Triage Notes (Signed)
Pt reports a "knot" that has been enlarging on the left side of her neck for the past 3 months - had a CT done but unsure diagnosis with primary doctor. Pt reports if she moves her left arm it causes pain. Pt concerned that this pain/knot is causing her high blood pressure. Pt went to cardiologist and her Lisinopril was doubled from 10 to 20mg  plus added Amlodipine before bed and is still having high BP. Pt reports she is feeling worse. Visible knot the size of a tangerine noted to pt's left lower neck. Pt also reports intermittent RLQ abd pain that started about 5 days ago when she was walking, went to the restroom and felt like something ruptured - the pain has gotten worse since with nausea.

## 2019-07-25 NOTE — ED Notes (Signed)
[phone] Call visitor when roomed.

## 2019-07-26 ENCOUNTER — Emergency Department (HOSPITAL_COMMUNITY): Payer: Medicare HMO

## 2019-07-26 DIAGNOSIS — R1031 Right lower quadrant pain: Secondary | ICD-10-CM | POA: Diagnosis not present

## 2019-07-26 MED ORDER — ONDANSETRON 4 MG PO TBDP
8.0000 mg | ORAL_TABLET | Freq: Once | ORAL | Status: AC
  Administered 2019-07-26: 03:00:00 8 mg via ORAL
  Filled 2019-07-26: qty 2

## 2019-07-26 MED ORDER — HYDROCODONE-ACETAMINOPHEN 5-325 MG PO TABS
1.0000 | ORAL_TABLET | Freq: Once | ORAL | Status: AC
  Administered 2019-07-26: 1 via ORAL
  Filled 2019-07-26: qty 1

## 2019-07-26 NOTE — Discharge Instructions (Addendum)

## 2019-07-26 NOTE — ED Provider Notes (Signed)
[facility] EMERGENCY DEPARTMENT Provider Note   CSN: [id number] Arrival date & time: 07/25/19  2013     History Chief Complaint  Patient presents with  . Multiple Medical Complaints  . Neck Pain  . Hypertension  . Abdominal Pain    Brandi Holmes is a 69 y.o. female.  The history is provided by the patient.  Hypertension Associated symptoms include abdominal pain. Pertinent negatives include no chest pain and no shortness of breath.  Abdominal Pain Pain location:  RLQ Pain quality: not aching   Pain radiates to:  Back Pain severity:  Moderate Onset quality:  Gradual Duration:  4 days Timing:  Constant Progression:  Worsening Relieved by:  Nothing Worsened by:  Palpation and movement Associated symptoms: nausea   Associated symptoms: no chest pain, no dysuria, no fever, no hematochezia and no shortness of breath   Patient with history of hypertension presents with multiple complaints.  She reports right lower quadrant abdominal pain for the past several days.  She reports nausea.  No dysuria.  She reports the pain will radiate into her back.  Patient also reports recent issues with her blood pressure has been elevated more than normal She denies any active chest pain or shortness of breath  Patient also reports a knot on the left side of her neck that is been ongoing for quite some time.  She reports recent CT imaging of chest and neck without a formal diagnosis.     Past Medical History:  Diagnosis Date  . Hypertension     Patient Active Problem List   Diagnosis Date Noted  . Anemia 09/23/2011  . Hypertension 09/11/2011  . Hearing loss 09/11/2011  . Preventative health care 09/11/2011  . Tobacco use 09/11/2011    Past Surgical History:  Procedure Laterality Date  . ABDOMINAL HYSTERECTOMY  1974   heavy bleeding  . ABDOMINAL HYSTERECTOMY    . FOOT SURGERY     bilateral bunions and hammer toes     OB History    Gravida  0   Para  0   Term  0   Preterm  0   AB  0   Living        SAB  0   TAB  0   Ectopic  0   Multiple      Live Births              Family History  Problem Relation Age of Onset  . Heart disease Mother 95       bypass at age   . Hypertension Mother   . Cancer Maternal Aunt        breast  . Cancer Paternal Aunt   . Diabetes Neg Hx   . Stroke Neg Hx     Social History   Tobacco Use  . Smoking status: Former Smoker    Types: Cigarettes    Quit date: 2006    Years since quitting: 15.2  . Smokeless tobacco: Never Used  Substance Use Topics  . Alcohol use: Yes    Comment: occasional  . Drug use: No    Home Medications Prior to Admission medications   Medication Sig Start Date End Date Taking? Authorizing Provider  acetaminophen (TYLENOL) 500 MG tablet Take 500 mg by mouth every 6 (six) hours as needed.    [provider]  amLODipine (NORVASC) 5 MG tablet Take 1 tablet (5 mg total) by mouth every evening. 07/15/19 10/13/19  Terri Skains,  Sunit, DO  ascorbic acid (VITAMIN C) 500 MG tablet Take 500 mg by mouth 3 (three) times a week.    [provider]  aspirin EC 81 MG tablet Take 81 mg by mouth 3 (three) times a week.    [provider]  calcium carbonate (OSCAL) 1500 (600 Ca) MG TABS tablet Take 600 mg of elemental calcium by mouth 3 (three) times a week.    [provider]  lisinopril (PRINIVIL,ZESTRIL) 10 MG tablet Take 20 mg by mouth in the morning.    [provider]  magnesium 30 MG tablet Take 30 mg by mouth 2 (two) times daily.    [provider]  Multiple Vitamins-Minerals (MULTIVITAMIN WITH MINERALS) tablet Take 1 tablet by mouth daily.    [provider]    Allergies    Patient has no known allergies.  Review of Systems   Review of Systems  Constitutional: Negative for fever.  Respiratory: Negative for shortness of breath.   Cardiovascular: Negative for chest pain.  Gastrointestinal: Positive  for abdominal pain and nausea. Negative for hematochezia.  Genitourinary: Negative for dysuria.  All other systems reviewed and are negative.   Physical Exam Updated Vital Signs BP (!) 181/88   Pulse 79   Temp 98.7 F (37.1 C) (Oral)   Resp 14   SpO2 97%   Physical Exam CONSTITUTIONAL: Well developed/well nourished, appears younger than her stated age HEAD: Normocephalic/atraumatic EYES: EOMI/PERRL ENMT: Mucous membranes moist NECK: supple no meningeal signs, no lymphadenopathy, there is no anterior neck mass noted, no erythema, no abscess SPINE/BACK:entire spine nontender CV: S1/S2 noted, no murmurs/rubs/gallops noted LUNGS: Lungs are clear to auscultation bilaterally, no apparent distress ABDOMEN: soft, moderate LUQ tenderness, no rebound or guarding, bowel sounds noted throughout abdomen, lower abdominal surgical scar from previous hysterectomy GU:no cva tenderness NEURO: Pt is awake/alert/appropriate, moves all extremitiesx4.  No facial droop.  EXTREMITIES: pulses normal/equal, full ROM SKIN: warm, color normal PSYCH: no abnormalities of mood noted, alert and oriented to situation ED Results / Procedures / Treatments   Labs (all labs ordered are listed, but only abnormal results are displayed) Labs Reviewed  LIPASE, BLOOD - Abnormal; Notable for the following components:      Result Value   Lipase 56 (*)    All other components within normal limits  COMPREHENSIVE METABOLIC PANEL - Abnormal; Notable for the following components:   Sodium 134 (*)    CO2 20 (*)    Glucose, Bld 118 (*)    All other components within normal limits  CBC - Abnormal; Notable for the following components:   WBC 13.5 (*)    RBC 5.18 (*)    HCT 47.8 (*)    All other components within normal limits  URINALYSIS, ROUTINE W REFLEX MICROSCOPIC - Abnormal; Notable for the following components:   Ketones, ur 5 (*)    Protein, ur 30 (*)    All other components within normal limits    EKG EKG  Interpretation  Date/Time:  Sunday July 25 2019 21:03:39 EDT Ventricular Rate:  87 PR Interval:  124 QRS Duration: 84 QT Interval:  366 QTC Calculation: 440 R Axis:   82 Text Interpretation: Normal sinus rhythm Nonspecific ST abnormality Abnormal ECG No previous ECGs available Confirmed by Ripley Fraise ([id number]) on 07/26/2019 2:33:05 AM   Radiology CT Renal Stone Study  Result Date: 07/26/2019 CLINICAL DATA:  Right flank pain. EXAM: CT ABDOMEN AND PELVIS WITHOUT CONTRAST TECHNIQUE: Multidetector CT imaging of the abdomen  and pelvis was performed following the standard protocol without IV contrast. COMPARISON:  None. FINDINGS: Lower chest: No acute abnormality. Hepatobiliary: Several ill-defined subcentimeter foci of parenchymal low attenuation are seen scattered throughout the liver. No gallstones, gallbladder wall thickening, or biliary dilatation. Pancreas: Unremarkable. No pancreatic ductal dilatation or surrounding inflammatory changes. Spleen: Normal in size without focal abnormality. Adrenals/Urinary Tract: Adrenal glands are unremarkable. Kidneys are normal, without renal calculi, focal lesion, or hydronephrosis. Bladder is unremarkable. Stomach/Bowel: Stomach is within normal limits. Appendix appears normal. No evidence of bowel wall thickening, distention, or inflammatory changes. Vascular/Lymphatic: There is moderate severity aortic calcification. No enlarged abdominal or pelvic lymph nodes. Reproductive: Status post hysterectomy. No adnexal masses. Other: No abdominal wall hernia or abnormality. No abdominopelvic ascites. Musculoskeletal: No acute or significant osseous findings. IMPRESSION: 1. Several ill-defined subcentimeter foci of parenchymal low attenuation scattered throughout the liver. These may represent small hepatic cysts or hemangiomas. Correlation with hepatic ultrasound is recommended. 2. Moderate severity aortic calcification. Aortic Atherosclerosis (ICD10-I70.0).  Electronically Signed   By: Virgina Norfolk M.D.   On: 07/26/2019 03:54    Procedures Procedures (including critical care time)  Medications Ordered in ED Medications  sodium chloride flush (NS) 0.9 % injection 3 mL (0 mLs Intravenous Hold 07/26/19 0311)  ondansetron (ZOFRAN-ODT) disintegrating tablet 8 mg (8 mg Oral Given 07/26/19 0314)  HYDROcodone-acetaminophen (NORCO/VICODIN) 5-325 MG per tablet 1 tablet (1 tablet Oral Given 07/26/19 4967)    ED Course  I have reviewed the triage vital signs and the nursing notes.  Pertinent labs & imaging results that were available during my care of the patient were reviewed by me and considered in my medical decision making (see chart for details).    MDM Rules/Calculators/A&P                      3:43 AM Patient presents for multiple complaints.  She reports a knot on left side of her neck for quite some time with outpatient work-up.  She has had recent CT imaging and was found to have no obvious findings in her neck.  However CT chest did reveal lung mass and liver nodules.  Patient reports she is aware of these findings and her outpatient providers are referring her to pulmonologist. CT abdomen pelvis ordered for abdominal pain 4:46 AM CT abdomen pelvis did not reveal any acute findings, hepatic cysts noted that were noted before.  Patient was informed of these findings and has already had this worked up as an outpatient Patient is resting comfortably and blood pressure appears improved BP 139/70   Pulse 63   Temp 98.7 F (37.1 C) (Oral)   Resp 18   SpO2 98%  At this point I feel she is appropriate for discharge home.  She has good follow-up arrangement for her blood pressure management as well as the abnormal CT findings Patient agreeable with plan Patient declines further pain meds    This patient presents to the ED for concern of abdominal pain, this involves an extensive number of treatment options, and is a complaint that carries  with it a high risk of complications and morbidity.  The differential diagnosis includes kidney stone, appendicitis, perforation   Lab Tests:   I Ordered, reviewed, and interpreted labs, which included urinalysis, electrolytes, CBC  Medicines ordered:   I ordered medication Vicodin for pain  Imaging Studies ordered:   I ordered imaging studies which included CT renal   I independently visualized and interpreted imaging  which showed no acute findings  Additional history obtained:     Previous records obtained and reviewed     Reevaluation:  After the interventions stated above, I reevaluated the patient and found patient is improved    Final Clinical Impression(s) / ED Diagnoses Final diagnoses:  Neck pain  Essential hypertension  Right lower quadrant abdominal pain    Rx / DC Orders ED Discharge Orders    None       Ripley Fraise, MD 07/26/19 0448

## 2019-07-26 NOTE — ED Notes (Signed)
Patient verbalizes understanding of discharge instructions. Opportunity for questioning and answers were provided. Armband removed by staff, pt discharged from ED. Pt. ambulatory and discharged home.  

## 2019-07-27 ENCOUNTER — Ambulatory Visit: Payer: Medicare HMO

## 2019-07-27 ENCOUNTER — Other Ambulatory Visit: Payer: Self-pay

## 2019-07-27 DIAGNOSIS — I10 Essential (primary) hypertension: Secondary | ICD-10-CM

## 2019-07-29 ENCOUNTER — Telehealth: Payer: Self-pay

## 2019-07-29 ENCOUNTER — Other Ambulatory Visit: Payer: Self-pay | Admitting: *Deleted

## 2019-07-29 NOTE — Progress Notes (Signed)
The proposed treatment discussed in cancer conference 07/29/19 is for discussion purpose only an dis not a binding recommendation.  The patietn was not physically examined nor present for their treatment options.  Therefore, final treatment plans cannot be decided.

## 2019-07-29 NOTE — Telephone Encounter (Signed)
Patient called and is anxious to receive ECHO results. Please advise.

## 2019-07-31 ENCOUNTER — Emergency Department (HOSPITAL_COMMUNITY): Payer: Medicare HMO

## 2019-07-31 ENCOUNTER — Encounter (HOSPITAL_COMMUNITY): Payer: Self-pay

## 2019-07-31 ENCOUNTER — Emergency Department (HOSPITAL_COMMUNITY)
Admission: EM | Admit: 2019-07-31 | Discharge: 2019-07-31 | Disposition: A | Discharge status: Home / Self Care | Payer: Medicare HMO | Attending: Emergency Medicine | Admitting: Emergency Medicine

## 2019-07-31 ENCOUNTER — Other Ambulatory Visit: Payer: Self-pay

## 2019-07-31 DIAGNOSIS — Z79899 Other long term (current) drug therapy: Secondary | ICD-10-CM | POA: Diagnosis not present

## 2019-07-31 DIAGNOSIS — R1031 Right lower quadrant pain: Secondary | ICD-10-CM

## 2019-07-31 DIAGNOSIS — I10 Essential (primary) hypertension: Secondary | ICD-10-CM | POA: Insufficient documentation

## 2019-07-31 DIAGNOSIS — Z7982 Long term (current) use of aspirin: Secondary | ICD-10-CM | POA: Insufficient documentation

## 2019-07-31 DIAGNOSIS — Z87891 Personal history of nicotine dependence: Secondary | ICD-10-CM | POA: Insufficient documentation

## 2019-07-31 LAB — URINALYSIS, ROUTINE W REFLEX MICROSCOPIC
Bilirubin Urine: NEGATIVE
Glucose, UA: NEGATIVE mg/dL
Hgb urine dipstick: NEGATIVE
Ketones, ur: 20 mg/dL — AB
Leukocytes,Ua: NEGATIVE
Nitrite: NEGATIVE
Protein, ur: NEGATIVE mg/dL
Specific Gravity, Urine: 1.019 (ref 1.005–1.030)
pH: 8 (ref 5.0–8.0)

## 2019-07-31 LAB — CBC WITH DIFFERENTIAL/PLATELET
Abs Immature Granulocytes: 0.07 10*3/uL (ref 0.00–0.07)
Basophils Absolute: 0.1 10*3/uL (ref 0.0–0.1)
Basophils Relative: 0 %
Eosinophils Absolute: 0 10*3/uL (ref 0.0–0.5)
Eosinophils Relative: 0 %
HCT: 44.5 % (ref 36.0–46.0)
Hemoglobin: 14.1 g/dL (ref 12.0–15.0)
Immature Granulocytes: 0 %
Lymphocytes Relative: 11 %
Lymphs Abs: 1.7 10*3/uL (ref 0.7–4.0)
MCH: 29 pg (ref 26.0–34.0)
MCHC: 31.7 g/dL (ref 30.0–36.0)
MCV: 91.4 fL (ref 80.0–100.0)
Monocytes Absolute: 0.6 10*3/uL (ref 0.1–1.0)
Monocytes Relative: 4 %
Neutro Abs: 13.3 10*3/uL — ABNORMAL HIGH (ref 1.7–7.7)
Neutrophils Relative %: 85 %
Platelets: 341 10*3/uL (ref 150–400)
RBC: 4.87 MIL/uL (ref 3.87–5.11)
RDW: 12.2 % (ref 11.5–15.5)
WBC: 15.8 10*3/uL — ABNORMAL HIGH (ref 4.0–10.5)
nRBC: 0 % (ref 0.0–0.2)

## 2019-07-31 LAB — COMPREHENSIVE METABOLIC PANEL
ALT: 14 U/L (ref 0–44)
AST: 20 U/L (ref 15–41)
Albumin: 4.4 g/dL (ref 3.5–5.0)
Alkaline Phosphatase: 48 U/L (ref 38–126)
Anion gap: 10 (ref 5–15)
BUN: 13 mg/dL (ref 8–23)
CO2: 22 mmol/L (ref 22–32)
Calcium: 9.3 mg/dL (ref 8.9–10.3)
Chloride: 104 mmol/L (ref 98–111)
Creatinine, Ser: 0.97 mg/dL (ref 0.44–1.00)
GFR calc Af Amer: >60 mL/min (ref >60)
GFR calc non Af Amer: 59 mL/min — ABNORMAL LOW (ref >60)
Glucose, Bld: 153 mg/dL — ABNORMAL HIGH (ref 70–99)
Potassium: 3.6 mmol/L (ref 3.5–5.1)
Sodium: 136 mmol/L (ref 135–145)
Total Bilirubin: 1 mg/dL (ref 0.3–1.2)
Total Protein: 8 g/dL (ref 6.5–8.1)

## 2019-07-31 LAB — LIPASE, BLOOD: Lipase: 27 U/L (ref 11–51)

## 2019-07-31 MED ORDER — IOHEXOL 300 MG/ML  SOLN
100.0000 mL | Freq: Once | INTRAMUSCULAR | Status: AC | PRN
  Administered 2019-07-31: 100 mL via INTRAVENOUS

## 2019-07-31 MED ORDER — MORPHINE SULFATE (PF) 4 MG/ML IV SOLN
4.0000 mg | Freq: Once | INTRAVENOUS | Status: AC
  Administered 2019-07-31: 4 mg via INTRAVENOUS
  Filled 2019-07-31: qty 1

## 2019-07-31 MED ORDER — METOCLOPRAMIDE HCL 5 MG/ML IJ SOLN
10.0000 mg | Freq: Once | INTRAMUSCULAR | Status: AC
  Administered 2019-07-31: 10 mg via INTRAVENOUS
  Filled 2019-07-31: qty 2

## 2019-07-31 MED ORDER — ONDANSETRON HCL 4 MG/2ML IJ SOLN
4.0000 mg | Freq: Once | INTRAMUSCULAR | Status: AC
  Administered 2019-07-31: 4 mg via INTRAVENOUS
  Filled 2019-07-31: qty 2

## 2019-07-31 MED ORDER — ONDANSETRON HCL 4 MG PO TABS: 4.0000 mg | ORAL_TABLET | Freq: Three times a day (TID) | ORAL | 0 refills | Status: DC | PRN

## 2019-07-31 MED ORDER — SODIUM CHLORIDE (PF) 0.9 % IJ SOLN
INTRAMUSCULAR | Status: AC
  Filled 2019-07-31: qty 50

## 2019-07-31 MED ORDER — HYDROCODONE-ACETAMINOPHEN 5-325 MG PO TABS: 2.0000 | ORAL_TABLET | ORAL | 0 refills | Status: DC | PRN

## 2019-07-31 MED ORDER — METOCLOPRAMIDE HCL 10 MG PO TABS: 10.0000 mg | ORAL_TABLET | Freq: Three times a day (TID) | ORAL | 0 refills | Status: DC | PRN

## 2019-07-31 MED ORDER — DIBUCAINE (PERIANAL) 1 % EX OINT: 1.0000 "application " | TOPICAL_OINTMENT | CUTANEOUS | 0 refills | Status: DC | PRN

## 2019-07-31 MED ORDER — FENTANYL CITRATE (PF) 100 MCG/2ML IJ SOLN
100.0000 ug | Freq: Once | INTRAMUSCULAR | Status: DC
  Filled 2019-07-31: qty 2

## 2019-07-31 NOTE — Discharge Instructions (Signed)

## 2019-07-31 NOTE — ED Triage Notes (Addendum)
Patient reports right (catching) groin pain x1 week. Patient states she was seen last Saturday for groin pain and a "knot or bubble that pops up on left side of neck". Patient also reports nausea from large air bubbles and pain, and belching. Patient also reports she lost 6 pounds since last Saturday.

## 2019-07-31 NOTE — ED Provider Notes (Signed)
[facility] DEPT Provider Note   CSN: [id number] Arrival date & time: 07/31/19  1525     History Chief Complaint  Patient presents with  . Groin Pain    right groin    Brandi Holmes is a 69 y.o. female who presents emergency department chief complaint of abdominal pain.  The patient is a difficult historian due to her Animated behavior and her obvious discomfort.  Essentially she is complaining of pain in her right lower quadrant.  She states that this is been going on and brought her to the emergency department 5 days ago.  She had a CT renal stone study that was negative.  Patient states that today while she was sitting in the bath and had sudden onset of pain right lower quadrant abdominal pain.  She attributes this to a surgical scar she had from a hysterectomy 20 years ago.  She is complaining of palpable "gristle" in the right side of her wound.  She rates her pain as severe, constantly achy and at times severe and sharp.  She denies urinary symptoms, flank pain, vaginal pain.  She denies chest pain or shortness of breath but has been vomiting which she describes as "frothy pink slime."  She feels like there is swelling and heaviness in her right lower quadrant.  HPI     Past Medical History:  Diagnosis Date  . Hypertension     Patient Active Problem List   Diagnosis Date Noted  . Anemia 09/23/2011  . Hypertension 09/11/2011  . Hearing loss 09/11/2011  . Preventative health care 09/11/2011  . Tobacco use 09/11/2011    Past Surgical History:  Procedure Laterality Date  . ABDOMINAL HYSTERECTOMY  1974   heavy bleeding  . ABDOMINAL HYSTERECTOMY    . FOOT SURGERY     bilateral bunions and hammer toes     OB History    Gravida  0   Para  0   Term  0   Preterm  0   AB  0   Living        SAB  0   TAB  0   Ectopic  0   Multiple      Live Births              Family History  Problem Relation Age of Onset  .  Heart disease Mother 47       bypass at age   . Hypertension Mother   . Cancer Maternal Aunt        breast  . Cancer Paternal Aunt   . Diabetes Neg Hx   . Stroke Neg Hx     Social History   Tobacco Use  . Smoking status: Former Smoker    Types: Cigarettes    Quit date: 2006    Years since quitting: 15.3  . Smokeless tobacco: Never Used  Substance Use Topics  . Alcohol use: Yes    Comment: occasional  . Drug use: No    Home Medications Prior to Admission medications   Medication Sig Start Date End Date Taking? Authorizing Provider  acetaminophen (TYLENOL) 500 MG tablet Take 500 mg by mouth every 6 (six) hours as needed.    [provider]  amLODipine (NORVASC) 5 MG tablet Take 1 tablet (5 mg total) by mouth every evening. 07/15/19 10/13/19  Tolia, Sunit, DO  ascorbic acid (VITAMIN C) 500 MG tablet Take 500 mg by mouth 3 (three) times a week.  [provider]  aspirin EC 81 MG tablet Take 81 mg by mouth 3 (three) times a week.    [provider]  calcium carbonate (OSCAL) 1500 (600 Ca) MG TABS tablet Take 600 mg of elemental calcium by mouth 3 (three) times a week.    [provider]  lisinopril (PRINIVIL,ZESTRIL) 10 MG tablet Take 20 mg by mouth in the morning.    [provider]  magnesium 30 MG tablet Take 30 mg by mouth 2 (two) times daily.    [provider]  Multiple Vitamins-Minerals (MULTIVITAMIN WITH MINERALS) tablet Take 1 tablet by mouth daily.    [provider]    Allergies    Patient has no known allergies.  Review of Systems   Review of Systems Ten systems reviewed and are negative for acute change, except as noted in the HPI.   Physical Exam Updated Vital Signs BP (!) 165/97 (BP Location: Right Arm)   Pulse 85   Temp 98.4 F (36.9 C) (Oral)   Resp 18   Ht 5\' 5"  (1.651 m)   Wt 86.6 kg   SpO2 100%   BMI 31.78 kg/m   Physical Exam Vitals and nursing note reviewed.  Constitutional:       General: She is not in acute distress.    Appearance: She is well-developed. She is not diaphoretic.  HENT:     Head: Normocephalic and atraumatic.  Eyes:     General: No scleral icterus.    Conjunctiva/sclera: Conjunctivae normal.  Cardiovascular:     Rate and Rhythm: Normal rate and regular rhythm.     Heart sounds: Normal heart sounds. No murmur. No friction rub. No gallop.   Pulmonary:     Effort: Pulmonary effort is normal. No respiratory distress.     Breath sounds: Normal breath sounds.  Abdominal:     General: Bowel sounds are normal. There is no distension.     Palpations: Abdomen is soft. There is no mass.     Tenderness: There is abdominal tenderness. There is guarding.     Comments: Well-healed surgical scar under the pannus of her abdomen.  There is mild erythema in the middle part of the scar and laterally without any evidence evidence of infection, ulceration or tissue breakdown.  No surrounding erythema. Patient is guarding making abdominal examination difficult.  Musculoskeletal:     Cervical back: Normal range of motion.  Skin:    General: Skin is warm and dry.  Neurological:     Mental Status: She is alert and oriented to person, place, and time.  Psychiatric:        Behavior: Behavior normal.     ED Results / Procedures / Treatments   Labs (all labs ordered are listed, but only abnormal results are displayed) Labs Reviewed - No data to display  EKG None  Radiology No results found.  Procedures Procedures (including critical care time)  Medications Ordered in ED Medications - No data to display  ED Course  I have reviewed the triage vital signs and the nursing notes.  Pertinent labs & imaging results that were available during my care of the patient were reviewed by me and considered in my medical decision making (see chart for details).    MDM Rules/Calculators/A&P                      This patient complains of RLQ abdominal pain, this  involves an extensive number of treatment options,  and is a complaint that carries with it a high risk of complications and morbidity.  The differential diagnosis includes Differential diagnosis of her lower abdominal considerations include pelvic inflammatory disease, , appendicitis, urinary calculi, ruptured ovarian cyst or tumor,  tubo-ovarian abscess,  diverticulitis, cystitis, hernia, abdominal wall abscess.   I Ordered, reviewed, and interpreted labs, which included urine which is without infection, white blood cell count elevated.  CMP shows shows elevated blood glucose.  All of this may be secondary to acute phase reaction. I ordered medication morphine, Zofran and Reglan for pain and nausea I ordered imaging studies which included CT abdomen pelvis with contrast and I independently visualized and interpreted imaging which showed no acute abnormalities.  The patient does have liver cyst seen on previous imaging and area of hypodensity within the kidney which will need outpatient renal ultrasound. Previous records obtained and reviewed  I consulted Dr. Tomi Bamberger and discussed lab and imaging findings  Critical interventions: Pain meds and antiemetic  After the interventions stated above, I reevaluated the patient and found improved  Patient with recurrent abdominal pain, multiple complaints, behavior is somewhat histrionic although she did appear quite uncomfortable.  Patient has pain out of proportion in her very old surgical scar.  It does appear she has a little bit of candidal infection in the creases.  Have ordered dibucaine.  She is also on nystatin.  I reviewed the PDMP and she will go home with Vicodin and Reglan.  She needs to follow closely with her primary care physician for further follow-up.  There is no evidence of acute cause of her infection including abdominal wall infection.  Discussed return precautions  Final Clinical Impression(s) / ED Diagnoses Final diagnoses:  None     Rx / DC Orders ED Discharge Orders    None       Margarita Mail, PA-C 07/31/19 2349    Dorie Rank, MD 08/01/19 [phone]

## 2019-08-02 NOTE — Telephone Encounter (Signed)
Pls call her back I sent her a mychart message

## 2019-08-04 NOTE — Telephone Encounter (Signed)
Spoke with patient. Patient voiced understanding.

## 2019-08-05 ENCOUNTER — Encounter: Payer: Self-pay | Admitting: Pulmonary Disease

## 2019-08-05 ENCOUNTER — Other Ambulatory Visit: Payer: Self-pay

## 2019-08-05 ENCOUNTER — Ambulatory Visit (INDEPENDENT_AMBULATORY_CARE_PROVIDER_SITE_OTHER): Payer: Medicare HMO | Admitting: Pulmonary Disease

## 2019-08-05 VITALS — BP 128/82 | HR 81 | Ht 65.0 in | Wt 184.4 lb

## 2019-08-05 DIAGNOSIS — R590 Localized enlarged lymph nodes: Secondary | ICD-10-CM | POA: Diagnosis not present

## 2019-08-05 DIAGNOSIS — R911 Solitary pulmonary nodule: Secondary | ICD-10-CM

## 2019-08-05 DIAGNOSIS — Z87891 Personal history of nicotine dependence: Secondary | ICD-10-CM | POA: Diagnosis not present

## 2019-08-05 DIAGNOSIS — J432 Centrilobular emphysema: Secondary | ICD-10-CM | POA: Diagnosis not present

## 2019-08-05 NOTE — Progress Notes (Signed)
Synopsis: Referred in April 2021 for abnormal CT, lung nodule by Lin Landsman, MD  Subjective:   PATIENT ID: Brandi Holmes GENDER: female DOB: 1950-07-28, MRN: [id number]  Chief Complaint  Patient presents with  . Consult    Pt being referred due to nodules seen on CT.  Pt denies any current complaints of cough or SOB.  Pt states she has been having a pain on right side in kidney area. Due to this, pt states she has been having problems with nausea and vomiting.    This is a 69 year old female past medical history of hypertension, former smoker quit in 2006.  Patient initially presented to the emergency department on 07/31/2019 with right lower quadrant abdominal pain during the patient's evaluation she had CT imaging of the abdomen pelvis.  Prior to the ED visit with neck pain had a CT soft tissue of the neck and chest.  On 07/20/2019 patient CT chest revealed a 2.1 cm spiculated right upper lobe mass concerning for a primary lung cancer.  Patient was referred here for evaluation of lung mass.  OV for 08/05/2019: Patient seen today in the office.  Has lots of questions regarding her CT scan.  A little bit anxious today.  Denies hemoptysis.  Denies weight loss she does complain of a leg pain that has been going on for some time radiates into her groin.  Denies fevers chills.    Past Medical History:  Diagnosis Date  . Hypertension      Family History  Problem Relation Age of Onset  . Heart disease Mother 63       bypass at age   . Hypertension Mother   . Cancer Maternal Aunt        breast  . Cancer Paternal Aunt   . Diabetes Neg Hx   . Stroke Neg Hx      Past Surgical History:  Procedure Laterality Date  . ABDOMINAL HYSTERECTOMY  1974   heavy bleeding  . ABDOMINAL HYSTERECTOMY    . FOOT SURGERY     bilateral bunions and hammer toes    Social History   Socioeconomic History  . Marital status: Divorced    Spouse name: Not on file  . Number of children: 2  .  Years of education: Not on file  . Highest education level: Not on file  Occupational History  . Not on file  Tobacco Use  . Smoking status: Former Smoker    Packs/day: 0.30    Years: 50.00    Pack years: 15.00    Types: Cigarettes    Quit date: 2016    Years since quitting: 5.3  . Smokeless tobacco: Never Used  Substance and Sexual Activity  . Alcohol use: Yes    Comment: occasional  . Drug use: No  . Sexual activity: Not Currently  Other Topics Concern  . Not on file  Social History Narrative   ** Merged History Encounter **       Lives alone.  Divoced.  Adult children- 1 living, 1 deceased.   Social Determinants of Health   Financial Resource Strain:   . Difficulty of Paying Living Expenses:   Food Insecurity:   . Worried About Charity fundraiser in the Last Year:   . Arboriculturist in the Last Year:   Transportation Needs:   . Film/video editor (Medical):   Marland Kitchen Lack of Transportation (Non-Medical):   Physical Activity:   . Days of Exercise per  Week:   . Minutes of Exercise per Session:   Stress:   . Feeling of Stress :   Social Connections:   . Frequency of Communication with Friends and Family:   . Frequency of Social Gatherings with Friends and Family:   . Attends Religious Services:   . Active Member of Clubs or Organizations:   . Attends Archivist Meetings:   Marland Kitchen Marital Status:   Intimate Partner Violence:   . Fear of Current or Ex-Partner:   . Emotionally Abused:   Marland Kitchen Physically Abused:   . Sexually Abused:      Allergies  Allergen Reactions  . Crab [Shellfish Allergy] Swelling    Swelling of feet     Outpatient Medications Prior to Visit  Medication Sig Dispense Refill  . acetaminophen (TYLENOL) 500 MG tablet Take 500 mg by mouth every 6 (six) hours as needed for moderate pain or headache.     Marland Kitchen amLODipine (NORVASC) 5 MG tablet Take 1 tablet (5 mg total) by mouth every evening. 90 tablet 0  . ascorbic acid (VITAMIN C) 500 MG  tablet Take 500 mg by mouth 3 (three) times a week.    Marland Kitchen aspirin 325 MG tablet Take 325 mg by mouth 3 (three) times a week.    . calcium carbonate (OSCAL) 1500 (600 Ca) MG TABS tablet Take 600 mg of elemental calcium by mouth 3 (three) times a week.    Marland Kitchen lisinopril (PRINIVIL,ZESTRIL) 10 MG tablet Take 20 mg by mouth in the morning.    . magnesium 30 MG tablet Take 30 mg by mouth 2 (two) times daily.    . metoCLOPramide (REGLAN) 10 MG tablet Take 1 tablet (10 mg total) by mouth 3 (three) times daily as needed for nausea (headache / nausea). 20 tablet 0  . Multiple Vitamins-Minerals (MULTIVITAMIN WITH MINERALS) tablet Take 1 tablet by mouth daily.    Marland Kitchen nystatin-triamcinolone ointment (MYCOLOG) Apply 1 application topically in the morning and at bedtime.    Marland Kitchen oxyCODONE-acetaminophen (PERCOCET) 7.5-325 MG tablet Take 1 tablet by mouth in the morning, at noon, and at bedtime.    Marland Kitchen POTASSIUM PO Take 1 tablet by mouth daily.    Marland Kitchen aspirin EC 81 MG tablet Take 81 mg by mouth 3 (three) times a week.    . dibucaine (NUPERCAINAL) 1 % OINT Place 1 application rectally as needed (apply to the scar for pain relief). 28 g 0  . HYDROcodone-acetaminophen (NORCO/VICODIN) 5-325 MG tablet Take 2 tablets by mouth every 4 (four) hours as needed. 10 tablet 0  . ondansetron (ZOFRAN) 4 MG tablet Take 1 tablet (4 mg total) by mouth every 8 (eight) hours as needed for nausea or vomiting. 10 tablet 0   No facility-administered medications prior to visit.    Review of Systems  Constitutional: Negative for chills, fever, malaise/fatigue and weight loss.  HENT: Negative for hearing loss, sore throat and tinnitus.   Eyes: Negative for blurred vision and double vision.  Respiratory: Positive for cough and shortness of breath. Negative for hemoptysis, sputum production, wheezing and stridor.   Cardiovascular: Negative for chest pain, palpitations, orthopnea, leg swelling and PND.  Gastrointestinal: Negative for abdominal pain,  constipation, diarrhea, heartburn, nausea and vomiting.  Genitourinary: Negative for dysuria, hematuria and urgency.  Musculoskeletal: Negative for joint pain and myalgias.  Skin: Negative for itching and rash.  Neurological: Negative for dizziness, tingling, weakness and headaches.  Endo/Heme/Allergies: Negative for environmental allergies. Does not bruise/bleed easily.  Psychiatric/Behavioral: Negative  for depression. The patient is not nervous/anxious and does not have insomnia.   All other systems reviewed and are negative.    Objective:  Physical Exam Vitals reviewed.  Constitutional:      General: She is not in acute distress.    Appearance: She is well-developed.  HENT:     Head: Normocephalic and atraumatic.  Eyes:     General: No scleral icterus.    Conjunctiva/sclera: Conjunctivae normal.     Pupils: Pupils are equal, round, and reactive to light.  Neck:     Vascular: No JVD.     Trachea: No tracheal deviation.  Cardiovascular:     Rate and Rhythm: Normal rate and regular rhythm.     Heart sounds: Normal heart sounds. No murmur.  Pulmonary:     Effort: Pulmonary effort is normal. No tachypnea, accessory muscle usage or respiratory distress.     Breath sounds: Normal breath sounds. No stridor. No wheezing, rhonchi or rales.  Musculoskeletal:        General: No tenderness.     Cervical back: Neck supple.  Lymphadenopathy:     Cervical: No cervical adenopathy.  Skin:    General: Skin is warm and dry.     Capillary Refill: Capillary refill takes less than 2 seconds.     Findings: No rash.  Neurological:     Mental Status: She is alert and oriented to person, place, and time.  Psychiatric:        Behavior: Behavior normal.      Vitals:   08/05/19 1030  BP: 128/82  Pulse: 81  SpO2: 96%  Weight: 184 lb 6.4 oz (83.6 kg)  Height: 5\' 5"  (1.651 m)   96% on RA BMI Readings from Last 3 Encounters:  08/05/19 30.69 kg/m  07/31/19 31.78 kg/m  07/15/19 32.20  kg/m   Wt Readings from Last 3 Encounters:  08/05/19 184 lb 6.4 oz (83.6 kg)  07/31/19 191 lb (86.6 kg)  07/15/19 198 lb (89.8 kg)     CBC    Component Value Date/Time   WBC 15.8 (H) 07/31/2019 1721   RBC 4.87 07/31/2019 1721   HGB 14.1 07/31/2019 1721   HCT 44.5 07/31/2019 1721   PLT 341 07/31/2019 1721   MCV 91.4 07/31/2019 1721   MCH 29.0 07/31/2019 1721   MCHC 31.7 07/31/2019 1721   RDW 12.2 07/31/2019 1721   LYMPHSABS 1.7 07/31/2019 1721   MONOABS 0.6 07/31/2019 1721   EOSABS 0.0 07/31/2019 1721   BASOSABS 0.1 07/31/2019 1721    Chest Imaging: 07/20/2019 CT chest 2.1 cm spiculated right upper lobe mass. The patient's images have been independently reviewed by me.    Pulmonary Functions Testing Results: No flowsheet data found.  FeNO: none  Pathology: none  Echocardiogram: none  Heart Catheterization: none     Assessment & Plan:     ICD-10-CM   1. Right upper lobe pulmonary nodule  R91.1 NM PET Image Initial (PI) Skull Base To Thigh    Pulmonary Function Test  2. Former smoker  Z87.891   3. Centrilobular emphysema ([place])  J43.2   4. Mediastinal adenopathy  R59.0 NM PET Image Initial (PI) Skull Base To Thigh    Pulmonary Function Test    Discussion:  Assessment:   This is a new diagnosis of a 2.1 cm spiculated right upper lobe pulmonary nodule concerning for a primary bronchogenic carcinoma.  CT imaging is likely consistent with a new lung cancer in a former smoker with evidence  of centrilobular emphysema.  She does not have pet imaging at this time.  There is no obvious mediastinal nodal structures involved on CT imaging.  It does appear that there is early blockage or occlusion of right upper lobe posterior segment of the airway.  Plan Following Extensive Data Review & Interpretation:  . I reviewed prior external note(s) from 07/25/2019 ED visit, 07/31/2019 ED visit Dr. Tomi Bamberger . I reviewed the result(s) of 07/26/2019 CT renal stone study, 07/31/2019 CT  abdomen pelvis, 07/20/2019 CT soft tissue neck small adenopathy noted . I have ordered nuclear medicine PET scan  Independent interpretation of tests . Review of patient's CT chest 07/20/2019 images revealed 2.1 cm right upper lobe spiculated lung mass. The patient's images have been independently reviewed by me.    Case discussed with Dr. Earlie Server and cardiothoracic surgery during [facility] conference.   We will plan for video bronchoscopy with endobronchial ultrasound of the mediastinal adenopathy.  We will also plan for electromagnetic navigational bronchoscopy to the upper lobe mass.  There is a good chance they could be a small endobronchial component to this and we may be able to see this without navigation.  If not we need to go ahead and prepared for the utilization of navigation.  We may end of using a radial endobronchial ultrasound to be able to see exact distances from the most proximal airway. We discussed all of the risk benefits and alternatives of proceeding with invasive tissue diagnosis with the patient today in the office to include bleeding, pneumothorax and damage to the mediastinal structures.  Patient is agreeable to proceed.  She has stopped taking her daily aspirin already as she has been taking more pain medications as the issue in her leg.  We will plan for the patient's procedure on 08/13/2019.  We appreciate the consultation.   Current Outpatient Medications:  .  acetaminophen (TYLENOL) 500 MG tablet, Take 500 mg by mouth every 6 (six) hours as needed for moderate pain or headache. , Disp: , Rfl:  .  amLODipine (NORVASC) 5 MG tablet, Take 1 tablet (5 mg total) by mouth every evening., Disp: 90 tablet, Rfl: 0 .  ascorbic acid (VITAMIN C) 500 MG tablet, Take 500 mg by mouth 3 (three) times a week., Disp: , Rfl:  .  aspirin 325 MG tablet, Take 325 mg by mouth 3 (three) times a week., Disp: , Rfl:  .  calcium carbonate (OSCAL) 1500 (600 Ca) MG TABS tablet, Take 600 mg of  elemental calcium by mouth 3 (three) times a week., Disp: , Rfl:  .  lisinopril (PRINIVIL,ZESTRIL) 10 MG tablet, Take 20 mg by mouth in the morning., Disp: , Rfl:  .  magnesium 30 MG tablet, Take 30 mg by mouth 2 (two) times daily., Disp: , Rfl:  .  metoCLOPramide (REGLAN) 10 MG tablet, Take 1 tablet (10 mg total) by mouth 3 (three) times daily as needed for nausea (headache / nausea)., Disp: 20 tablet, Rfl: 0 .  Multiple Vitamins-Minerals (MULTIVITAMIN WITH MINERALS) tablet, Take 1 tablet by mouth daily., Disp: , Rfl:  .  nystatin-triamcinolone ointment (MYCOLOG), Apply 1 application topically in the morning and at bedtime., Disp: , Rfl:  .  oxyCODONE-acetaminophen (PERCOCET) 7.5-325 MG tablet, Take 1 tablet by mouth in the morning, at noon, and at bedtime., Disp: , Rfl:  .  POTASSIUM PO, Take 1 tablet by mouth daily., Disp: , Rfl:    Garner Nash, DO [facility] 08/05/2019 10:53 AM

## 2019-08-05 NOTE — H&P (View-Only) (Signed)
Synopsis: Referred in April 2021 for abnormal CT, lung nodule by Lin Landsman, MD  Subjective:   PATIENT ID: Brandi Holmes GENDER: female DOB: 05-Jun-1950, MRN: [id number]  Chief Complaint  Patient presents with  . Consult    Pt being referred due to nodules seen on CT.  Pt denies any current complaints of cough or SOB.  Pt states she has been having a pain on right side in kidney area. Due to this, pt states she has been having problems with nausea and vomiting.    This is a 69 year old female past medical history of hypertension, former smoker quit in 2006.  Patient initially presented to the emergency department on 07/31/2019 with right lower quadrant abdominal pain during the patient's evaluation she had CT imaging of the abdomen pelvis.  Prior to the ED visit with neck pain had a CT soft tissue of the neck and chest.  On 07/20/2019 patient CT chest revealed a 2.1 cm spiculated right upper lobe mass concerning for a primary lung cancer.  Patient was referred here for evaluation of lung mass.  OV for 08/05/2019: Patient seen today in the office.  Has lots of questions regarding her CT scan.  A little bit anxious today.  Denies hemoptysis.  Denies weight loss she does complain of a leg pain that has been going on for some time radiates into her groin.  Denies fevers chills.    Past Medical History:  Diagnosis Date  . Hypertension      Family History  Problem Relation Age of Onset  . Heart disease Mother 85       bypass at age   . Hypertension Mother   . Cancer Maternal Aunt        breast  . Cancer Paternal Aunt   . Diabetes Neg Hx   . Stroke Neg Hx      Past Surgical History:  Procedure Laterality Date  . ABDOMINAL HYSTERECTOMY  1974   heavy bleeding  . ABDOMINAL HYSTERECTOMY    . FOOT SURGERY     bilateral bunions and hammer toes    Social History   Socioeconomic History  . Marital status: Divorced    Spouse name: Not on file  . Number of children: 2  .  Years of education: Not on file  . Highest education level: Not on file  Occupational History  . Not on file  Tobacco Use  . Smoking status: Former Smoker    Packs/day: 0.30    Years: 50.00    Pack years: 15.00    Types: Cigarettes    Quit date: 2016    Years since quitting: 5.3  . Smokeless tobacco: Never Used  Substance and Sexual Activity  . Alcohol use: Yes    Comment: occasional  . Drug use: No  . Sexual activity: Not Currently  Other Topics Concern  . Not on file  Social History Narrative   ** Merged History Encounter **       Lives alone.  Divoced.  Adult children- 1 living, 1 deceased.   Social Determinants of Health   Financial Resource Strain:   . Difficulty of Paying Living Expenses:   Food Insecurity:   . Worried About Charity fundraiser in the Last Year:   . Arboriculturist in the Last Year:   Transportation Needs:   . Film/video editor (Medical):   Marland Kitchen Lack of Transportation (Non-Medical):   Physical Activity:   . Days of Exercise per  Week:   . Minutes of Exercise per Session:   Stress:   . Feeling of Stress :   Social Connections:   . Frequency of Communication with Friends and Family:   . Frequency of Social Gatherings with Friends and Family:   . Attends Religious Services:   . Active Member of Clubs or Organizations:   . Attends Archivist Meetings:   Marland Kitchen Marital Status:   Intimate Partner Violence:   . Fear of Current or Ex-Partner:   . Emotionally Abused:   Marland Kitchen Physically Abused:   . Sexually Abused:      Allergies  Allergen Reactions  . Crab [Shellfish Allergy] Swelling    Swelling of feet     Outpatient Medications Prior to Visit  Medication Sig Dispense Refill  . acetaminophen (TYLENOL) 500 MG tablet Take 500 mg by mouth every 6 (six) hours as needed for moderate pain or headache.     Marland Kitchen amLODipine (NORVASC) 5 MG tablet Take 1 tablet (5 mg total) by mouth every evening. 90 tablet 0  . ascorbic acid (VITAMIN C) 500 MG  tablet Take 500 mg by mouth 3 (three) times a week.    Marland Kitchen aspirin 325 MG tablet Take 325 mg by mouth 3 (three) times a week.    . calcium carbonate (OSCAL) 1500 (600 Ca) MG TABS tablet Take 600 mg of elemental calcium by mouth 3 (three) times a week.    Marland Kitchen lisinopril (PRINIVIL,ZESTRIL) 10 MG tablet Take 20 mg by mouth in the morning.    . magnesium 30 MG tablet Take 30 mg by mouth 2 (two) times daily.    . metoCLOPramide (REGLAN) 10 MG tablet Take 1 tablet (10 mg total) by mouth 3 (three) times daily as needed for nausea (headache / nausea). 20 tablet 0  . Multiple Vitamins-Minerals (MULTIVITAMIN WITH MINERALS) tablet Take 1 tablet by mouth daily.    Marland Kitchen nystatin-triamcinolone ointment (MYCOLOG) Apply 1 application topically in the morning and at bedtime.    Marland Kitchen oxyCODONE-acetaminophen (PERCOCET) 7.5-325 MG tablet Take 1 tablet by mouth in the morning, at noon, and at bedtime.    Marland Kitchen POTASSIUM PO Take 1 tablet by mouth daily.    Marland Kitchen aspirin EC 81 MG tablet Take 81 mg by mouth 3 (three) times a week.    . dibucaine (NUPERCAINAL) 1 % OINT Place 1 application rectally as needed (apply to the scar for pain relief). 28 g 0  . HYDROcodone-acetaminophen (NORCO/VICODIN) 5-325 MG tablet Take 2 tablets by mouth every 4 (four) hours as needed. 10 tablet 0  . ondansetron (ZOFRAN) 4 MG tablet Take 1 tablet (4 mg total) by mouth every 8 (eight) hours as needed for nausea or vomiting. 10 tablet 0   No facility-administered medications prior to visit.    Review of Systems  Constitutional: Negative for chills, fever, malaise/fatigue and weight loss.  HENT: Negative for hearing loss, sore throat and tinnitus.   Eyes: Negative for blurred vision and double vision.  Respiratory: Positive for cough and shortness of breath. Negative for hemoptysis, sputum production, wheezing and stridor.   Cardiovascular: Negative for chest pain, palpitations, orthopnea, leg swelling and PND.  Gastrointestinal: Negative for abdominal pain,  constipation, diarrhea, heartburn, nausea and vomiting.  Genitourinary: Negative for dysuria, hematuria and urgency.  Musculoskeletal: Negative for joint pain and myalgias.  Skin: Negative for itching and rash.  Neurological: Negative for dizziness, tingling, weakness and headaches.  Endo/Heme/Allergies: Negative for environmental allergies. Does not bruise/bleed easily.  Psychiatric/Behavioral: Negative  for depression. The patient is not nervous/anxious and does not have insomnia.   All other systems reviewed and are negative.    Objective:  Physical Exam Vitals reviewed.  Constitutional:      General: She is not in acute distress.    Appearance: She is well-developed.  HENT:     Head: Normocephalic and atraumatic.  Eyes:     General: No scleral icterus.    Conjunctiva/sclera: Conjunctivae normal.     Pupils: Pupils are equal, round, and reactive to light.  Neck:     Vascular: No JVD.     Trachea: No tracheal deviation.  Cardiovascular:     Rate and Rhythm: Normal rate and regular rhythm.     Heart sounds: Normal heart sounds. No murmur.  Pulmonary:     Effort: Pulmonary effort is normal. No tachypnea, accessory muscle usage or respiratory distress.     Breath sounds: Normal breath sounds. No stridor. No wheezing, rhonchi or rales.  Musculoskeletal:        General: No tenderness.     Cervical back: Neck supple.  Lymphadenopathy:     Cervical: No cervical adenopathy.  Skin:    General: Skin is warm and dry.     Capillary Refill: Capillary refill takes less than 2 seconds.     Findings: No rash.  Neurological:     Mental Status: She is alert and oriented to person, place, and time.  Psychiatric:        Behavior: Behavior normal.      Vitals:   08/05/19 1030  BP: 128/82  Pulse: 81  SpO2: 96%  Weight: 184 lb 6.4 oz (83.6 kg)  Height: 5\' 5"  (1.651 m)   96% on RA BMI Readings from Last 3 Encounters:  08/05/19 30.69 kg/m  07/31/19 31.78 kg/m  07/15/19 32.20  kg/m   Wt Readings from Last 3 Encounters:  08/05/19 184 lb 6.4 oz (83.6 kg)  07/31/19 191 lb (86.6 kg)  07/15/19 198 lb (89.8 kg)     CBC    Component Value Date/Time   WBC 15.8 (H) 07/31/2019 1721   RBC 4.87 07/31/2019 1721   HGB 14.1 07/31/2019 1721   HCT 44.5 07/31/2019 1721   PLT 341 07/31/2019 1721   MCV 91.4 07/31/2019 1721   MCH 29.0 07/31/2019 1721   MCHC 31.7 07/31/2019 1721   RDW 12.2 07/31/2019 1721   LYMPHSABS 1.7 07/31/2019 1721   MONOABS 0.6 07/31/2019 1721   EOSABS 0.0 07/31/2019 1721   BASOSABS 0.1 07/31/2019 1721    Chest Imaging: 07/20/2019 CT chest 2.1 cm spiculated right upper lobe mass. The patient's images have been independently reviewed by me.    Pulmonary Functions Testing Results: No flowsheet data found.  FeNO: none  Pathology: none  Echocardiogram: none  Heart Catheterization: none     Assessment & Plan:     ICD-10-CM   1. Right upper lobe pulmonary nodule  R91.1 NM PET Image Initial (PI) Skull Base To Thigh    Pulmonary Function Test  2. Former smoker  Z87.891   3. Centrilobular emphysema ([place])  J43.2   4. Mediastinal adenopathy  R59.0 NM PET Image Initial (PI) Skull Base To Thigh    Pulmonary Function Test    Discussion:  Assessment:   This is a new diagnosis of a 2.1 cm spiculated right upper lobe pulmonary nodule concerning for a primary bronchogenic carcinoma.  CT imaging is likely consistent with a new lung cancer in a former smoker with evidence  of centrilobular emphysema.  She does not have pet imaging at this time.  There is no obvious mediastinal nodal structures involved on CT imaging.  It does appear that there is early blockage or occlusion of right upper lobe posterior segment of the airway.  Plan Following Extensive Data Review & Interpretation:  . I reviewed prior external note(s) from 07/25/2019 ED visit, 07/31/2019 ED visit Dr. Tomi Bamberger . I reviewed the result(s) of 07/26/2019 CT renal stone study, 07/31/2019 CT  abdomen pelvis, 07/20/2019 CT soft tissue neck small adenopathy noted . I have ordered nuclear medicine PET scan  Independent interpretation of tests . Review of patient's CT chest 07/20/2019 images revealed 2.1 cm right upper lobe spiculated lung mass. The patient's images have been independently reviewed by me.    Case discussed with Dr. Earlie Server and cardiothoracic surgery during [facility] conference.   We will plan for video bronchoscopy with endobronchial ultrasound of the mediastinal adenopathy.  We will also plan for electromagnetic navigational bronchoscopy to the upper lobe mass.  There is a good chance they could be a small endobronchial component to this and we may be able to see this without navigation.  If not we need to go ahead and prepared for the utilization of navigation.  We may end of using a radial endobronchial ultrasound to be able to see exact distances from the most proximal airway. We discussed all of the risk benefits and alternatives of proceeding with invasive tissue diagnosis with the patient today in the office to include bleeding, pneumothorax and damage to the mediastinal structures.  Patient is agreeable to proceed.  She has stopped taking her daily aspirin already as she has been taking more pain medications as the issue in her leg.  We will plan for the patient's procedure on 08/13/2019.  We appreciate the consultation.   Current Outpatient Medications:  .  acetaminophen (TYLENOL) 500 MG tablet, Take 500 mg by mouth every 6 (six) hours as needed for moderate pain or headache. , Disp: , Rfl:  .  amLODipine (NORVASC) 5 MG tablet, Take 1 tablet (5 mg total) by mouth every evening., Disp: 90 tablet, Rfl: 0 .  ascorbic acid (VITAMIN C) 500 MG tablet, Take 500 mg by mouth 3 (three) times a week., Disp: , Rfl:  .  aspirin 325 MG tablet, Take 325 mg by mouth 3 (three) times a week., Disp: , Rfl:  .  calcium carbonate (OSCAL) 1500 (600 Ca) MG TABS tablet, Take 600 mg of  elemental calcium by mouth 3 (three) times a week., Disp: , Rfl:  .  lisinopril (PRINIVIL,ZESTRIL) 10 MG tablet, Take 20 mg by mouth in the morning., Disp: , Rfl:  .  magnesium 30 MG tablet, Take 30 mg by mouth 2 (two) times daily., Disp: , Rfl:  .  metoCLOPramide (REGLAN) 10 MG tablet, Take 1 tablet (10 mg total) by mouth 3 (three) times daily as needed for nausea (headache / nausea)., Disp: 20 tablet, Rfl: 0 .  Multiple Vitamins-Minerals (MULTIVITAMIN WITH MINERALS) tablet, Take 1 tablet by mouth daily., Disp: , Rfl:  .  nystatin-triamcinolone ointment (MYCOLOG), Apply 1 application topically in the morning and at bedtime., Disp: , Rfl:  .  oxyCODONE-acetaminophen (PERCOCET) 7.5-325 MG tablet, Take 1 tablet by mouth in the morning, at noon, and at bedtime., Disp: , Rfl:  .  POTASSIUM PO, Take 1 tablet by mouth daily., Disp: , Rfl:    Garner Nash, DO [facility] 08/05/2019 10:53 AM

## 2019-08-05 NOTE — Patient Instructions (Addendum)
Thank you for visiting Dr. Valeta Harms at [facility]. Today we recommend the following:  Orders Placed This Encounter  Procedures  . NM PET Image Initial (PI) Skull Base To Thigh  . Ambulatory referral to Pulmonology  . Pulmonary Function Test   We will be scheduling your case on 4/30 You will be receiving phone calls from pre-op. We will set this up for you.   Return in about 4 weeks (around 09/02/2019) for with APP or Dr. Valeta Harms.    Please do your part to reduce the spread of COVID-19.

## 2019-08-06 ENCOUNTER — Other Ambulatory Visit: Payer: Self-pay

## 2019-08-06 ENCOUNTER — Ambulatory Visit: Payer: Medicare HMO | Admitting: Cardiology

## 2019-08-06 ENCOUNTER — Encounter: Payer: Self-pay | Admitting: Cardiology

## 2019-08-06 VITALS — BP 139/74 | HR 77 | Temp 98.2°F | Resp 15 | Ht 65.0 in | Wt 184.0 lb

## 2019-08-06 DIAGNOSIS — I10 Essential (primary) hypertension: Secondary | ICD-10-CM

## 2019-08-06 DIAGNOSIS — Z6830 Body mass index (BMI) 30.0-30.9, adult: Secondary | ICD-10-CM

## 2019-08-06 DIAGNOSIS — E6609 Other obesity due to excess calories: Secondary | ICD-10-CM

## 2019-08-06 DIAGNOSIS — Z712 Person consulting for explanation of examination or test findings: Secondary | ICD-10-CM

## 2019-08-06 DIAGNOSIS — Z87891 Personal history of nicotine dependence: Secondary | ICD-10-CM

## 2019-08-06 NOTE — Progress Notes (Signed)
Brandi Holmes Date of Birth: February 07, 1951 MRN: [id number] Primary Care Provider:Reese, Zadie Cleverly, MD Primary Cardiologist: Rex Kras, DO (established care 07/15/2019)  Date: 08/06/19 Last Office Visit: 07/15/2019  Chief Complaint  Patient presents with  . Follow-up  . Results  . Hypertension    HPI  Brandi Holmes is a 69 y.o. female who presents to the office with a chief complaint of " follow-up on blood pressures and review test results." Patient's past medical history and cardiac risk factors include: Hypertension, former smoker, postmenopausal female, advanced age, obesity.  Patient was recently seen in April 2021 at request of her primary care physician for management of hypertension.    Hypertension: Patient states that she was diagnosed with high blood pressure approximately 15 years ago.  At the last visit amlodipine was added to her medical regiment and her blood pressures have improved since last office visit.  She brings her blood pressure log for review and her systolic blood pressures range between 283-151 mmHg and diastolic blood pressures range between 67-77 mmHg.  And her heart rate is consistently greater than 60 bpm.    Since last office visit patient also had echocardiogram and she was informed of the preserved left ventricular systolic function, grade 1 diastolic impairment, and mild valvular heart disease.  Patient states that she is currently following up with pulmonary given her abnormal CT findings and plans to undergo biopsy and PET scan in the near future.  No premature coronary artery disease in the family.    Denies prior history of coronary artery disease, myocardial infarction, congestive heart failure, deep venous thrombosis, pulmonary embolism, stroke, transient ischemic attack.  FUNCTIONAL STATUS: No structured exercise program or daily routine.    ALLERGIES: Allergies  Allergen Reactions  . Crab [Shellfish Allergy] Swelling     Swelling of feet     MEDICATION LIST PRIOR TO VISIT: Current Outpatient Medications on File Prior to Visit  Medication Sig Dispense Refill  . acetaminophen (TYLENOL) 500 MG tablet Take 500 mg by mouth every 6 (six) hours as needed for moderate pain or headache.     Marland Kitchen amLODipine (NORVASC) 5 MG tablet Take 1 tablet (5 mg total) by mouth every evening. 90 tablet 0  . ascorbic acid (VITAMIN C) 500 MG tablet Take 500 mg by mouth 3 (three) times a week.    Marland Kitchen aspirin 325 MG tablet Take 325 mg by mouth 3 (three) times a week.    . calcium carbonate (OSCAL) 1500 (600 Ca) MG TABS tablet Take 600 mg of elemental calcium by mouth 3 (three) times a week.    Marland Kitchen lisinopril (PRINIVIL,ZESTRIL) 10 MG tablet Take 20 mg by mouth in the morning.    . magnesium 30 MG tablet Take 30 mg by mouth 2 (two) times daily.    . metoCLOPramide (REGLAN) 10 MG tablet Take 1 tablet (10 mg total) by mouth 3 (three) times daily as needed for nausea (headache / nausea). 20 tablet 0  . Multiple Vitamins-Minerals (MULTIVITAMIN WITH MINERALS) tablet Take 1 tablet by mouth daily.    Marland Kitchen nystatin-triamcinolone ointment (MYCOLOG) Apply 1 application topically in the morning and at bedtime.    Marland Kitchen oxyCODONE-acetaminophen (PERCOCET) 7.5-325 MG tablet Take 1 tablet by mouth in the morning, at noon, and at bedtime.    Marland Kitchen POTASSIUM PO Take 1 tablet by mouth daily.     No current facility-administered medications on file prior to visit.    PAST MEDICAL HISTORY: Past Medical History:  Diagnosis Date  .  Hypertension     PAST SURGICAL HISTORY: Past Surgical History:  Procedure Laterality Date  . ABDOMINAL HYSTERECTOMY  1974   heavy bleeding  . ABDOMINAL HYSTERECTOMY    . FOOT SURGERY     bilateral bunions and hammer toes    FAMILY HISTORY: The patient family history includes Cancer in her maternal aunt and paternal aunt; Heart disease (age of onset: 9) in her mother; Hypertension in her mother.   SOCIAL HISTORY:  The patient   reports that she quit smoking about 5 years ago. Her smoking use included cigarettes. She has a 15.00 pack-year smoking history. She has never used smokeless tobacco. She reports current alcohol use. She reports that she does not use drugs.  14 ORGAN REVIEW OF SYSTEMS: CONSTITUTIONAL: No fever or significant weight loss EYES: No recent significant visual change EARS, NOSE, MOUTH, THROAT: No recent significant change in hearing CARDIOVASCULAR: See discussion in subjective/HPI RESPIRATORY: See discussion in subjective/HPI GASTROINTESTINAL: No recent complaints of abdominal pain GENITOURINARY: No recent significant change in genitourinary status MUSCULOSKELETAL: No recent significant change in musculoskeletal status INTEGUMENTARY: No recent rash NEUROLOGIC: No recent significant change in motor function PSYCHIATRIC: No recent significant change in mood ENDOCRINOLOGIC: No recent significant change in endocrine status HEMATOLOGIC/LYMPHATIC: No recent significant unexpected bruising ALLERGIC/IMMUNOLOGIC: No recent unexplained allergic reaction  PHYSICAL EXAM: Vitals with BMI 08/06/2019 08/05/2019 07/31/2019  Height - 5\' 5"  -  Weight 184 lbs 184 lbs 6 oz -  BMI 78.46 96.29 -  Systolic 528 413 244  Diastolic 74 82 89  Pulse 77 81 86   CONSTITUTIONAL: Well-developed and well-nourished. No acute distress.  SKIN: Skin is warm and dry. No rash noted. No cyanosis. No pallor. No jaundice HEAD: Normocephalic and atraumatic.  EYES: No scleral icterus MOUTH/THROAT: Moist oral membranes.  NECK: No JVD present. No thyromegaly noted. No carotid bruits  LYMPHATIC: No visible cervical adenopathy.  CHEST Normal respiratory effort. No intercostal retractions  LUNGS: Clear to auscultation bilaterally.  No stridor. No wheezes. No rales.  CARDIOVASCULAR: Regular rate and rhythm, positive S1-S2, no murmurs rubs or gallops appreciated ABDOMINAL: No apparent ascites.  EXTREMITIES: No peripheral edema    HEMATOLOGIC: No significant bruising NEUROLOGIC: Oriented to person, place, and time. Nonfocal. Normal muscle tone.  PSYCHIATRIC: Normal mood and affect. Normal behavior. Cooperative  CARDIAC DATABASE: EKG: 07/15/2019: Normal sinus rhythm with a ventricular rate of 64 bpm, normal axis deviation, no underlying ischemia or injury pattern.  Echocardiogram: 07/27/2019: LVEF 01%, grade 1 diastolic impairment, mild LVH, mild TR, RVSP 26 mmHg.  Stress Testing:  None  Heart Catheterization: None  LABORATORY DATA: CBC Latest Ref Rng & Units 07/31/2019 07/25/2019 09/16/2011  WBC 4.0 - 10.5 K/uL 15.8(H) 13.5(H) 11.0(H)  Hemoglobin 12.0 - 15.0 g/dL 14.1 14.9 13.1  Hematocrit 36.0 - 46.0 % 44.5 47.8(H) 39.2  Platelets 150 - 400 K/uL 341 352 309    CMP Latest Ref Rng & Units 07/31/2019 07/25/2019 07/22/2019  Glucose 70 - 99 mg/dL 153(H) 118(H) 96  BUN 8 - 23 mg/dL 13 10 13   Creatinine 0.44 - 1.00 mg/dL 0.97 0.88 0.87  Sodium 135 - 145 mmol/L 136 134(L) 142  Potassium 3.5 - 5.1 mmol/L 3.6 4.3 4.9  Chloride 98 - 111 mmol/L 104 102 104  CO2 22 - 32 mmol/L 22 20(L) 22  Calcium 8.9 - 10.3 mg/dL 9.3 9.6 10.2  Total Protein 6.5 - 8.1 g/dL 8.0 8.0 -  Total Bilirubin 0.3 - 1.2 mg/dL 1.0 0.7 -  Alkaline Phos 38 -  126 U/L 48 53 -  AST 15 - 41 U/L 20 21 -  ALT 0 - 44 U/L 14 14 -    Lipid Panel     Component Value Date/Time   CHOL 165 09/16/2011 1000   TRIG 84 09/16/2011 1000   HDL 45 09/16/2011 1000   CHOLHDL 3.7 09/16/2011 1000   VLDL 17 09/16/2011 1000   LDLCALC 103 (H) 09/16/2011 1000    No results found for: HGBA1C No components found for: NTPROBNP No results found for: TSH  FINAL MEDICATION LIST END OF ENCOUNTER: No orders of the defined types were placed in this encounter.   There are no discontinued medications.   Current Outpatient Medications:  .  acetaminophen (TYLENOL) 500 MG tablet, Take 500 mg by mouth every 6 (six) hours as needed for moderate pain or headache. , Disp: , Rfl:   .  amLODipine (NORVASC) 5 MG tablet, Take 1 tablet (5 mg total) by mouth every evening., Disp: 90 tablet, Rfl: 0 .  ascorbic acid (VITAMIN C) 500 MG tablet, Take 500 mg by mouth 3 (three) times a week., Disp: , Rfl:  .  aspirin 325 MG tablet, Take 325 mg by mouth 3 (three) times a week., Disp: , Rfl:  .  calcium carbonate (OSCAL) 1500 (600 Ca) MG TABS tablet, Take 600 mg of elemental calcium by mouth 3 (three) times a week., Disp: , Rfl:  .  lisinopril (PRINIVIL,ZESTRIL) 10 MG tablet, Take 20 mg by mouth in the morning., Disp: , Rfl:  .  magnesium 30 MG tablet, Take 30 mg by mouth 2 (two) times daily., Disp: , Rfl:  .  metoCLOPramide (REGLAN) 10 MG tablet, Take 1 tablet (10 mg total) by mouth 3 (three) times daily as needed for nausea (headache / nausea)., Disp: 20 tablet, Rfl: 0 .  Multiple Vitamins-Minerals (MULTIVITAMIN WITH MINERALS) tablet, Take 1 tablet by mouth daily., Disp: , Rfl:  .  nystatin-triamcinolone ointment (MYCOLOG), Apply 1 application topically in the morning and at bedtime., Disp: , Rfl:  .  oxyCODONE-acetaminophen (PERCOCET) 7.5-325 MG tablet, Take 1 tablet by mouth in the morning, at noon, and at bedtime., Disp: , Rfl:  .  POTASSIUM PO, Take 1 tablet by mouth daily., Disp: , Rfl:   IMPRESSION:    ICD-10-CM   1. Encounter to discuss test results  Z71.2   2. Essential hypertension  I10   3. Former smoker  Z87.891   4. Class 1 obesity due to excess calories without serious comorbidity with body mass index (BMI) of 30.0 to 30.9 in adult  E66.09    Z68.30      RECOMMENDATIONS: Alanea Woolridge is a 69 y.o. female whose past medical history and cardiac risk factors include: Hypertension, former smoker, postmenopausal female, advanced age, obesity.  Benign essential hypertension:  EKG illustrates normal sinus rhythm without underlying ischemia or injury pattern.  Continue current antihypertensive medications.  Echocardiogram results were discussed with the  patient and reviewed.  Low-salt diet.  Patient advised to call the office if her systolic blood pressure are consistently greater than 140 mmHg.  Former smoker: Educated on importance of continued smoking cessation.  Obesity, due to excess calories: Body mass index is 30.62 kg/m. . I reviewed with the patient the importance of diet, regular physical activity/exercise, weight loss.   . Patient is educated on increasing physical activity gradually as tolerated.  With the goal of moderate intensity exercise for 30 minutes a day 5 days a week.  No  orders of the defined types were placed in this encounter.  --Continue cardiac medications as reconciled in final medication list. --Return in about 6 months (around 02/05/2020). Or sooner if needed. --Continue follow-up with your primary care physician regarding the management of your other chronic comorbid conditions.  Patient's questions and concerns were addressed to her satisfaction. She voices understanding of the instructions provided during this encounter.   This note was created using a voice recognition software as a result there may be grammatical errors inadvertently enclosed that do not reflect the nature of this encounter. Every attempt is made to correct such errors.  Rex Kras, DO, [facility]. [place] Office: [phone]

## 2019-08-10 ENCOUNTER — Other Ambulatory Visit (HOSPITAL_COMMUNITY)
Admission: RE | Admit: 2019-08-10 | Discharge: 2019-08-10 | Disposition: A | Discharge status: Home / Self Care | Payer: Medicare HMO | Source: Ambulatory Visit | Attending: Pulmonary Disease | Admitting: Pulmonary Disease

## 2019-08-10 DIAGNOSIS — Z20822 Contact with and (suspected) exposure to covid-19: Secondary | ICD-10-CM | POA: Insufficient documentation

## 2019-08-10 DIAGNOSIS — Z01812 Encounter for preprocedural laboratory examination: Secondary | ICD-10-CM | POA: Diagnosis present

## 2019-08-10 LAB — SARS CORONAVIRUS 2 (TAT 6-24 HRS): SARS Coronavirus 2: NEGATIVE

## 2019-08-11 ENCOUNTER — Other Ambulatory Visit: Payer: Self-pay

## 2019-08-11 ENCOUNTER — Encounter (HOSPITAL_COMMUNITY): Payer: Self-pay | Admitting: Pulmonary Disease

## 2019-08-11 NOTE — Progress Notes (Signed)
Pt denies SOB and chest pain. Pt stated that she is under the care of Dr. Rex Kras, Cardiology and  Phineas Inches, PCP. Pt denies having a stress test and cardiac cath. Pt made aware to stop taking  vitamins, fish oil and herbal medications. Do not take any NSAIDs ie: Ibuprofen, Advil, Naproxen (Aleve), Motrin, BC and Goody Powder. Pt reminded to quarantine. Pt verbalized understanding of all pre-op instructions.

## 2019-08-12 ENCOUNTER — Encounter (HOSPITAL_COMMUNITY)
Admission: RE | Admit: 2019-08-12 | Discharge: 2019-08-12 | Disposition: A | Discharge status: Home / Self Care | Payer: Medicare HMO | Source: Ambulatory Visit | Attending: Pulmonary Disease | Admitting: Pulmonary Disease

## 2019-08-12 ENCOUNTER — Institutional Professional Consult (permissible substitution): Payer: Medicare HMO | Admitting: Pulmonary Disease

## 2019-08-12 DIAGNOSIS — Z87891 Personal history of nicotine dependence: Secondary | ICD-10-CM | POA: Diagnosis not present

## 2019-08-12 DIAGNOSIS — R590 Localized enlarged lymph nodes: Secondary | ICD-10-CM

## 2019-08-12 DIAGNOSIS — Z7982 Long term (current) use of aspirin: Secondary | ICD-10-CM | POA: Insufficient documentation

## 2019-08-12 DIAGNOSIS — Z79899 Other long term (current) drug therapy: Secondary | ICD-10-CM | POA: Diagnosis not present

## 2019-08-12 DIAGNOSIS — R911 Solitary pulmonary nodule: Secondary | ICD-10-CM | POA: Diagnosis present

## 2019-08-12 DIAGNOSIS — I10 Essential (primary) hypertension: Secondary | ICD-10-CM | POA: Diagnosis not present

## 2019-08-12 LAB — GLUCOSE, CAPILLARY: Glucose-Capillary: 103 mg/dL — ABNORMAL HIGH (ref 70–99)

## 2019-08-12 MED ORDER — FLUDEOXYGLUCOSE F - 18 (FDG) INJECTION
9.3000 | Freq: Once | INTRAVENOUS | Status: AC | PRN
  Administered 2019-08-12: 14:00:00 9.3 via INTRAVENOUS

## 2019-08-13 ENCOUNTER — Ambulatory Visit (HOSPITAL_COMMUNITY): Payer: Medicare HMO | Admitting: Anesthesiology

## 2019-08-13 ENCOUNTER — Encounter (HOSPITAL_COMMUNITY)
Admission: RE | Disposition: A | Discharge status: Home / Self Care | Payer: Self-pay | Source: Home / Self Care | Attending: Pulmonary Disease

## 2019-08-13 ENCOUNTER — Ambulatory Visit (HOSPITAL_COMMUNITY): Payer: Medicare HMO

## 2019-08-13 ENCOUNTER — Encounter (HOSPITAL_COMMUNITY): Payer: Self-pay | Admitting: Pulmonary Disease

## 2019-08-13 ENCOUNTER — Other Ambulatory Visit: Payer: Self-pay

## 2019-08-13 ENCOUNTER — Ambulatory Visit (HOSPITAL_COMMUNITY)
Admission: RE | Admit: 2019-08-13 | Discharge: 2019-08-13 | Disposition: A | Discharge status: Home / Self Care | Payer: Medicare HMO | Attending: Pulmonary Disease | Admitting: Pulmonary Disease

## 2019-08-13 DIAGNOSIS — R918 Other nonspecific abnormal finding of lung field: Secondary | ICD-10-CM | POA: Diagnosis not present

## 2019-08-13 DIAGNOSIS — E669 Obesity, unspecified: Secondary | ICD-10-CM | POA: Diagnosis not present

## 2019-08-13 DIAGNOSIS — Z6830 Body mass index (BMI) 30.0-30.9, adult: Secondary | ICD-10-CM | POA: Diagnosis not present

## 2019-08-13 DIAGNOSIS — Z87891 Personal history of nicotine dependence: Secondary | ICD-10-CM | POA: Diagnosis not present

## 2019-08-13 DIAGNOSIS — Z7982 Long term (current) use of aspirin: Secondary | ICD-10-CM | POA: Diagnosis not present

## 2019-08-13 DIAGNOSIS — R911 Solitary pulmonary nodule: Secondary | ICD-10-CM | POA: Diagnosis present

## 2019-08-13 DIAGNOSIS — I10 Essential (primary) hypertension: Secondary | ICD-10-CM | POA: Insufficient documentation

## 2019-08-13 DIAGNOSIS — R112 Nausea with vomiting, unspecified: Secondary | ICD-10-CM | POA: Insufficient documentation

## 2019-08-13 DIAGNOSIS — R590 Localized enlarged lymph nodes: Secondary | ICD-10-CM

## 2019-08-13 DIAGNOSIS — J432 Centrilobular emphysema: Secondary | ICD-10-CM | POA: Diagnosis not present

## 2019-08-13 DIAGNOSIS — Z79899 Other long term (current) drug therapy: Secondary | ICD-10-CM | POA: Insufficient documentation

## 2019-08-13 DIAGNOSIS — R1031 Right lower quadrant pain: Secondary | ICD-10-CM | POA: Insufficient documentation

## 2019-08-13 DIAGNOSIS — Z9889 Other specified postprocedural states: Secondary | ICD-10-CM

## 2019-08-13 HISTORY — DX: Other melanin hyperpigmentation: L81.4

## 2019-08-13 HISTORY — PX: BRONCHIAL BRUSHINGS: SHX5108

## 2019-08-13 HISTORY — PX: LUNG BIOPSY: SHX5088

## 2019-08-13 HISTORY — PX: VIDEO BRONCHOSCOPY WITH ENDOBRONCHIAL NAVIGATION: SHX6175

## 2019-08-13 HISTORY — PX: FINE NEEDLE ASPIRATION: SHX5430

## 2019-08-13 HISTORY — DX: Anemia, unspecified: D64.9

## 2019-08-13 HISTORY — DX: Presence of spectacles and contact lenses: Z97.3

## 2019-08-13 HISTORY — PX: VIDEO BRONCHOSCOPY WITH ENDOBRONCHIAL ULTRASOUND: SHX6177

## 2019-08-13 HISTORY — PX: BRONCHIAL WASHINGS: SHX5105

## 2019-08-13 HISTORY — DX: Presence of dental prosthetic device (complete) (partial): Z97.2

## 2019-08-13 HISTORY — DX: Solitary pulmonary nodule: R91.1

## 2019-08-13 LAB — CBC
HCT: 41.2 % (ref 36.0–46.0)
Hemoglobin: 12.7 g/dL (ref 12.0–15.0)
MCH: 28.8 pg (ref 26.0–34.0)
MCHC: 30.8 g/dL (ref 30.0–36.0)
MCV: 93.4 fL (ref 80.0–100.0)
Platelets: 309 10*3/uL (ref 150–400)
RBC: 4.41 MIL/uL (ref 3.87–5.11)
RDW: 12.2 % (ref 11.5–15.5)
WBC: 6.9 10*3/uL (ref 4.0–10.5)
nRBC: 0 % (ref 0.0–0.2)

## 2019-08-13 LAB — COMPREHENSIVE METABOLIC PANEL
ALT: 14 U/L (ref 0–44)
AST: 18 U/L (ref 15–41)
Albumin: 3.6 g/dL (ref 3.5–5.0)
Alkaline Phosphatase: 43 U/L (ref 38–126)
Anion gap: 7 (ref 5–15)
BUN: 9 mg/dL (ref 8–23)
CO2: 24 mmol/L (ref 22–32)
Calcium: 9.6 mg/dL (ref 8.9–10.3)
Chloride: 110 mmol/L (ref 98–111)
Creatinine, Ser: 1.03 mg/dL — ABNORMAL HIGH (ref 0.44–1.00)
GFR calc Af Amer: >60 mL/min (ref >60)
GFR calc non Af Amer: 55 mL/min — ABNORMAL LOW (ref >60)
Glucose, Bld: 121 mg/dL — ABNORMAL HIGH (ref 70–99)
Potassium: 4.1 mmol/L (ref 3.5–5.1)
Sodium: 141 mmol/L (ref 135–145)
Total Bilirubin: 0.6 mg/dL (ref 0.3–1.2)
Total Protein: 6.9 g/dL (ref 6.5–8.1)

## 2019-08-13 LAB — PROTIME-INR
INR: 0.9 (ref 0.8–1.2)
Prothrombin Time: 11.6 seconds (ref 11.4–15.2)

## 2019-08-13 LAB — APTT: aPTT: 33 seconds (ref 24–36)

## 2019-08-13 SURGERY — BRONCHOSCOPY, WITH EBUS
Anesthesia: General | Laterality: Right

## 2019-08-13 MED ORDER — ROCURONIUM BROMIDE 10 MG/ML (PF) SYRINGE
PREFILLED_SYRINGE | INTRAVENOUS | Status: DC | PRN
Start: 2019-08-13 — End: 2019-08-13
  Administered 2019-08-13: 50 mg via INTRAVENOUS
  Administered 2019-08-13 (×2): 20 mg via INTRAVENOUS

## 2019-08-13 MED ORDER — FENTANYL CITRATE (PF) 100 MCG/2ML IJ SOLN: 25.0000 ug | INTRAMUSCULAR | Status: DC | PRN

## 2019-08-13 MED ORDER — GLYCOPYRROLATE 0.2 MG/ML IJ SOLN
INTRAMUSCULAR | Status: DC | PRN
  Administered 2019-08-13 (×2): .1 mg via INTRAVENOUS
  Administered 2019-08-13: .2 mg via INTRAVENOUS

## 2019-08-13 MED ORDER — PHENYLEPHRINE 40 MCG/ML (10ML) SYRINGE FOR IV PUSH (FOR BLOOD PRESSURE SUPPORT)
PREFILLED_SYRINGE | INTRAVENOUS | Status: DC | PRN
  Administered 2019-08-13: 80 ug via INTRAVENOUS

## 2019-08-13 MED ORDER — PHENYLEPHRINE HCL-NACL 10-0.9 MG/250ML-% IV SOLN
INTRAVENOUS | Status: DC | PRN
  Administered 2019-08-13: 50 ug/min via INTRAVENOUS

## 2019-08-13 MED ORDER — SUGAMMADEX SODIUM 200 MG/2ML IV SOLN
INTRAVENOUS | Status: DC | PRN
Start: 2019-08-13 — End: 2019-08-13
  Administered 2019-08-13: 200 mg via INTRAVENOUS

## 2019-08-13 MED ORDER — OXYCODONE HCL 5 MG/5ML PO SOLN: 5.0000 mg | Freq: Once | ORAL | Status: DC | PRN

## 2019-08-13 MED ORDER — PROPOFOL 500 MG/50ML IV EMUL
INTRAVENOUS | Status: DC | PRN
Start: 2019-08-13 — End: 2019-08-13
  Administered 2019-08-13: 125 ug/kg/min via INTRAVENOUS
  Administered 2019-08-13: 100 ug/kg/min via INTRAVENOUS

## 2019-08-13 MED ORDER — PROPOFOL 10 MG/ML IV BOLUS
INTRAVENOUS | Status: DC | PRN
  Administered 2019-08-13: 150 mg via INTRAVENOUS

## 2019-08-13 MED ORDER — ONDANSETRON HCL 4 MG/2ML IJ SOLN
INTRAMUSCULAR | Status: DC | PRN
  Administered 2019-08-13: 4 mg via INTRAVENOUS

## 2019-08-13 MED ORDER — DEXAMETHASONE SODIUM PHOSPHATE 4 MG/ML IJ SOLN
INTRAMUSCULAR | Status: DC | PRN
  Administered 2019-08-13: 10 mg via INTRAVENOUS

## 2019-08-13 MED ORDER — OXYCODONE HCL 5 MG PO TABS: 5.0000 mg | ORAL_TABLET | Freq: Once | ORAL | Status: DC | PRN

## 2019-08-13 MED ORDER — LACTATED RINGERS IV SOLN
INTRAVENOUS | Status: DC | PRN
Start: 2019-08-13 — End: 2019-08-13

## 2019-08-13 MED ORDER — LIDOCAINE 2% (20 MG/ML) 5 ML SYRINGE
INTRAMUSCULAR | Status: DC | PRN
  Administered 2019-08-13: 60 mg via INTRAVENOUS

## 2019-08-13 MED ORDER — FENTANYL CITRATE (PF) 100 MCG/2ML IJ SOLN
INTRAMUSCULAR | Status: DC | PRN
  Administered 2019-08-13 (×2): 50 ug via INTRAVENOUS

## 2019-08-13 MED ORDER — ONDANSETRON HCL 4 MG/2ML IJ SOLN: 4.0000 mg | Freq: Once | INTRAMUSCULAR | Status: DC | PRN

## 2019-08-13 SURGICAL SUPPLY — 50 items
ADAPTER BRONCH F/PENTAX (ADAPTER) ×3 IMPLANT
ADAPTER VALVE BIOPSY EBUS (MISCELLANEOUS) IMPLANT
ADPTR VALVE BIOPSY EBUS (MISCELLANEOUS)
BRUSH CYTOL CELLEBRITY 1.5X140 (MISCELLANEOUS) ×3 IMPLANT
BRUSH SUPERTRAX BIOPSY (INSTRUMENTS) IMPLANT
BRUSH SUPERTRAX NDL-TIP CYTO (INSTRUMENTS) ×3 IMPLANT
CANISTER SUCT 3000ML PPV (MISCELLANEOUS) ×3 IMPLANT
CHANNEL WORK EXTEND EDGE 180 (KITS) IMPLANT
CHANNEL WORK EXTEND EDGE 45 (KITS) IMPLANT
CHANNEL WORK EXTEND EDGE 90 (KITS) IMPLANT
CONT SPEC 4OZ CLIKSEAL STRL BL (MISCELLANEOUS) ×3 IMPLANT
COVER BACK TABLE 60X90IN (DRAPES) ×3 IMPLANT
COVER DOME SNAP 22 D (MISCELLANEOUS) ×3 IMPLANT
FILTER STRAW FLUID ASPIR (MISCELLANEOUS) IMPLANT
FORCEPS BIOP RJ4 1.8 (CUTTING FORCEPS) IMPLANT
FORCEPS BIOP SUPERTRX PREMAR (INSTRUMENTS) ×3 IMPLANT
GAUZE SPONGE 4X4 12PLY STRL (GAUZE/BANDAGES/DRESSINGS) ×3 IMPLANT
GLOVE BIO SURGEON STRL SZ7.5 (GLOVE) ×3 IMPLANT
GLOVE SURG SS PI 7.5 STRL IVOR (GLOVE) ×6 IMPLANT
GOWN STRL REUS W/ TWL LRG LVL3 (GOWN DISPOSABLE) ×4 IMPLANT
GOWN STRL REUS W/TWL LRG LVL3 (GOWN DISPOSABLE) ×6
KIT CLEAN ENDO COMPLIANCE (KITS) ×6 IMPLANT
KIT LOCATABLE GUIDE (CANNULA) IMPLANT
KIT MARKER FIDUCIAL DELIVERY (KITS) IMPLANT
KIT PROCEDURE EDGE 180 (KITS) IMPLANT
KIT PROCEDURE EDGE 45 (KITS) IMPLANT
KIT PROCEDURE EDGE 90 (KITS) IMPLANT
KIT TURNOVER KIT B (KITS) ×3 IMPLANT
MARKER SKIN DUAL TIP RULER LAB (MISCELLANEOUS) ×3 IMPLANT
NEEDLE EBUS SONO TIP PENTAX (NEEDLE) ×3 IMPLANT
NEEDLE SUPERTRX PREMARK BIOPSY (NEEDLE) ×3 IMPLANT
NS IRRIG 1000ML POUR BTL (IV SOLUTION) ×3 IMPLANT
OIL SILICONE PENTAX (PARTS (SERVICE/REPAIRS)) ×3 IMPLANT
PAD ARMBOARD 7.5X6 YLW CONV (MISCELLANEOUS) ×6 IMPLANT
PATCHES PATIENT (LABEL) ×9 IMPLANT
SOL ANTI FOG 6CC (MISCELLANEOUS) ×2 IMPLANT
SOLUTION ANTI FOG 6CC (MISCELLANEOUS) ×1
SYR 20CC LL (SYRINGE) ×6 IMPLANT
SYR 20ML ECCENTRIC (SYRINGE) ×6 IMPLANT
SYR 50ML SLIP (SYRINGE) ×3 IMPLANT
SYR 5ML LUER SLIP (SYRINGE) ×3 IMPLANT
TOWEL OR 17X24 6PK STRL BLUE (TOWEL DISPOSABLE) ×3 IMPLANT
TRAP SPECIMEN MUCOUS 40CC (MISCELLANEOUS) IMPLANT
TUBE CONNECTING 20X1/4 (TUBING) ×6 IMPLANT
UNDERPAD 30X30 (UNDERPADS AND DIAPERS) ×3 IMPLANT
VALVE BIOPSY  SINGLE USE (MISCELLANEOUS) ×3
VALVE BIOPSY SINGLE USE (MISCELLANEOUS) ×2 IMPLANT
VALVE DISPOSABLE (MISCELLANEOUS) ×3 IMPLANT
VALVE SUCTION BRONCHIO DISP (MISCELLANEOUS) ×3 IMPLANT
WATER STERILE IRR 1000ML POUR (IV SOLUTION) ×3 IMPLANT

## 2019-08-13 NOTE — Anesthesia Preprocedure Evaluation (Addendum)
Anesthesia Evaluation  Patient identified by MRN, date of birth, ID band Patient awake    Reviewed: Allergy & Precautions, NPO status , Patient's Chart, lab work & pertinent test results  History of Anesthesia Complications Negative for: history of anesthetic complications  Airway Mallampati: II  TM Distance: >3 FB Neck ROM: Full    Dental  (+) Dental Advisory Given, Partial Upper, Partial Lower   Pulmonary former smoker,   Lung mass    Pulmonary exam normal        Cardiovascular hypertension, Pt. on medications Normal cardiovascular exam   '21 TTE - Mild concentric hypertrophy of the left ventricle.  EF 55%. Doppler evidence of grade I (impaired) diastolic dysfunction, normal LAP. Mild tricuspid regurgitation. Estimated pulmonary artery systolic pressure is 56YBWL.    Neuro/Psych negative neurological ROS  negative psych ROS   GI/Hepatic negative GI ROS, Neg liver ROS,   Endo/Other   Obesity   Renal/GU negative Renal ROS     Musculoskeletal negative musculoskeletal ROS (+)   Abdominal   Peds  Hematology negative hematology ROS (+)   Anesthesia Other Findings Covid neg 4/27   Reproductive/Obstetrics                           Anesthesia Physical Anesthesia Plan  ASA: III  Anesthesia Plan: General   Post-op Pain Management:    Induction: Intravenous  PONV Risk Score and Plan: 3 and Treatment may vary due to age or medical condition, Ondansetron and Dexamethasone  Airway Management Planned: LMA  Additional Equipment: None  Intra-op Plan:   Post-operative Plan: Extubation in OR  Informed Consent: I have reviewed the patients History and Physical, chart, labs and discussed the procedure including the risks, benefits and alternatives for the proposed anesthesia with the patient or authorized representative who has indicated his/her understanding and acceptance.     Dental  advisory given  Plan Discussed with: CRNA and Anesthesiologist  Anesthesia Plan Comments:        Anesthesia Quick Evaluation

## 2019-08-13 NOTE — Interval H&P Note (Signed)
History and Physical Interval Note:  08/13/2019 7:26 AM  Brandi Holmes  has presented today for surgery, with the diagnosis of LUNG MASS.  The various methods of treatment have been discussed with the patient and family. After consideration of risks, benefits and other options for treatment, the patient has consented to  Procedure(s): VIDEO BRONCHOSCOPY WITH ENDOBRONCHIAL ULTRASOUND (Right) VIDEO BRONCHOSCOPY WITH ENDOBRONCHIAL NAVIGATION (Right) as a surgical intervention.  The patient's history has been reviewed, patient examined, no change in status, stable for surgery.  I have reviewed the patient's chart and labs.  Questions were answered to the patient's satisfaction.    Patient seen in pre-op. We discussed risks, benefits, alternatives to including bleeding and pneumothorax. The patient is agreeable to proceed.   [place]

## 2019-08-13 NOTE — Anesthesia Postprocedure Evaluation (Signed)
Anesthesia Post Note  Patient: Brandi Holmes  Procedure(s) Performed: VIDEO BRONCHOSCOPY WITH ENDOBRONCHIAL ULTRASOUND (Right ) VIDEO BRONCHOSCOPY WITH ENDOBRONCHIAL NAVIGATION (Right ) BRONCHIAL BRUSHINGS FINE NEEDLE ASPIRATION (FNA) LINEAR BRONCHIAL WASHINGS LUNG BIOPSY     Patient location during evaluation: PACU Anesthesia Type: General Level of consciousness: awake and alert Pain management: pain level controlled Vital Signs Assessment: post-procedure vital signs reviewed and stable Respiratory status: spontaneous breathing, nonlabored ventilation and respiratory function stable Cardiovascular status: blood pressure returned to baseline and stable Postop Assessment: no apparent nausea or vomiting Anesthetic complications: no    Last Vitals:  Vitals:   08/13/19 1115 08/13/19 1130  BP: (!) 116/58 108/66  Pulse: 62 63  Resp: 20 15  Temp:  (!) 36.2 C  SpO2: 96% 94%    Last Pain:  Vitals:   08/13/19 1130  TempSrc:   PainSc: 0-No pain                 Audry Pili

## 2019-08-13 NOTE — Discharge Instructions (Signed)
Flexible Bronchoscopy, Care After This sheet gives you information about how to care for yourself after your test. Your doctor may also give you more specific instructions. If you have problems or questions, contact your doctor. Follow these instructions at home: Eating and drinking  The day after the test, go back to your normal diet. Driving  Do not drive for 24 hours if you were given a medicine to help you relax (sedative).  Do not drive or use heavy machinery while taking prescription pain medicine. General instructions   Take over-the-counter and prescription medicines only as told by your doctor.  Return to your normal activities as told. Ask what activities are safe for you.  Do not use any products that have nicotine or tobacco in them. This includes cigarettes and e-cigarettes. If you need help quitting, ask your doctor.  Keep all follow-up visits as told by your doctor. This is important. It is very important if you had a tissue sample (biopsy) taken. Get help right away if:  You have shortness of breath that gets worse.  You get light-headed.  You feel like you are going to pass out (faint).  You have chest pain.  You cough up: ? More than a little blood. ? More blood than before. Summary  Do not eat or drink anything (not even water) for 2 hours after your test, or until your numbing medicine wears off.  Do not use cigarettes. Do not use e-cigarettes.  Get help right away if you have chest pain. This information is not intended to replace advice given to you by your health care provider. Make sure you discuss any questions you have with your health care provider. Document Revised: 03/14/2017 Document Reviewed: 04/19/2016 Elsevier Patient Education  2020 [employer].

## 2019-08-13 NOTE — Anesthesia Procedure Notes (Signed)
Procedure Name: Intubation Date/Time: 08/13/2019 7:38 AM Performed by: Lieutenant Diego, CRNA Pre-anesthesia Checklist: Patient identified, Emergency Drugs available, Suction available and Patient being monitored Patient Re-evaluated:Patient Re-evaluated prior to induction Oxygen Delivery Method: Circle system utilized Preoxygenation: Pre-oxygenation with 100% oxygen Induction Type: IV induction Ventilation: Mask ventilation without difficulty Laryngoscope Size: Miller and 2 Grade View: Grade I Tube type: Oral Tube size: 8.5 mm Number of attempts: 1 Airway Equipment and Method: Stylet Placement Confirmation: ETT inserted through vocal cords under direct vision,  positive ETCO2 and breath sounds checked- equal and bilateral Secured at: 24 cm Tube secured with: Tape Dental Injury: Teeth and Oropharynx as per pre-operative assessment

## 2019-08-13 NOTE — Progress Notes (Signed)
Patient is doing well in recovery.  Vital signs are stable She is dressed and waiting for her ride.

## 2019-08-13 NOTE — Transfer of Care (Signed)
Immediate Anesthesia Transfer of Care Note  Patient: Brandi Holmes  Procedure(s) Performed: VIDEO BRONCHOSCOPY WITH ENDOBRONCHIAL ULTRASOUND (Right ) VIDEO BRONCHOSCOPY WITH ENDOBRONCHIAL NAVIGATION (Right ) BRONCHIAL BRUSHINGS FINE NEEDLE ASPIRATION (FNA) LINEAR  Patient Location: PACU  Anesthesia Type:General  Level of Consciousness: awake  Airway & Oxygen Therapy: Patient Spontanous Breathing and Patient connected to face mask oxygen  Post-op Assessment: Report given to RN and Post -op Vital signs reviewed and stable  Post vital signs: Reviewed and stable  Last Vitals:  Vitals Value Taken Time  BP 83/35 08/13/19 1031  Temp    Pulse 67 08/13/19 1031  Resp 20 08/13/19 1031  SpO2 100 % 08/13/19 1031  Vitals shown include unvalidated device data.  Last Pain:  Vitals:   08/13/19 0649  TempSrc:   PainSc: 0-No pain      Patients Stated Pain Goal: 1 (07/57/32 2567)  Complications: No apparent anesthesia complications

## 2019-08-13 NOTE — Op Note (Signed)
Video Bronchoscopy with Electromagnetic Navigation Procedure Note Video Bronchoscopy with Endobronchial Ultrasound Procedure Note  Date of Operation: 08/13/2019  Pre-op Diagnosis: Lung nodule, mediastinal adenopathy  Post-op Diagnosis: Lung nodule, mediastinal adenopathy  Surgeon: Garner Nash, DO   Assistants: None  Anesthesia: General endotracheal anesthesia  Operation: Flexible video fiberoptic bronchoscopy with electromagnetic navigation and biopsies.  Estimated Blood Loss: Minimal, <1IW  Complications: None Immediate   Indications and History: Brandi Holmes is a 69 y.o. female with right upper lobe lung nodule with mediastinal adenopathy.  The risks, benefits, complications, treatment options and expected outcomes were discussed with the patient.  The possibilities of pneumothorax, pneumonia, reaction to medication, pulmonary aspiration, perforation of a viscus, bleeding, failure to diagnose a condition and creating a complication requiring transfusion or operation were discussed with the patient who freely signed the consent.    Description of Procedure: The patient was seen in the Preoperative Area, was examined and was deemed appropriate to proceed.  The patient was taken to [facility] endoscopy room 2, identified as [name] and the procedure verified as Flexible Video Fiberoptic Bronchoscopy.  A Time Out was held and the above information confirmed.   Prior to the date of the procedure a high-resolution CT scan of the chest was performed. Utilizing [place] a virtual tracheobronchial tree was generated to allow the creation of distinct navigation pathways to the patient's parenchymal abnormalities. After being taken to the operating room general anesthesia was initiated and the patient  was orally intubated. The video fiberoptic bronchoscope was introduced via the endotracheal tube and a general inspection was performed which showed normal  right and left lung anatomy no evidence of endobronchial lesion.. The extendable working channel and locator guide were introduced into the bronchoscope. The distinct navigation pathways prepared prior to this procedure were then utilized to navigate to within 1.5 cm of patient's lesion(s) identified on CT scan.  A full fluoroscopic sweep was used for local registration. The extendable working channel was secured into place and the locator guide was withdrawn. Under fluoroscopic guidance transbronchial needle brushings, transbronchial Wang needle biopsies, and transbronchial forceps biopsies were performed to be sent for cytology and pathology.  There was difficulty navigating within the posterior right upper lobe subsegment.  We can get within the vicinity of the target however was likely missing the center of the target laterally.  We switched from a standard bronchoscope to a ultraslim Olympus bronchoscope.  We were able to pass the ultraslim scope into the posterior segment and angle towards the lesion identified in approximate planes on fluoroscopy.  Using the ultraslim scope we utilized Olympus tools for additional minimally invasive sampling of the posterior segment under fluoroscopy.  We obtained an additional set of needles, brushings and transbronchial biopsies.  After each of these were still considered an adequate on [place] the decision was to switch to 180 degree navigational catheter.  We completed an additional registration and fluoroscopic sweep with an inspiratory breath-hold APL 25.  We then again completed sampling with brushings and needles from the posterior segment of the right upper lobe with an attempt at a better angle using 180 degree catheter..  A bronchioalveolar lavage was performed in the right upper lobe posterior segment and sent for cytology   We then moved on to the portion of the endobronchial ultrasound procedure. The standard scope was then withdrawn and the endobronchial  ultrasound was used to identify and characterize the peritracheal, hilar and bronchial lymph nodes. Inspection showed a  small 8 mm cross-section paratracheal node identified under ultrasound within the 4R position.  We were also able to identify what appears to be the primary 2 cm lung mass within the posterior segment of the right upper lobe.  We were able to enter the posterior segment retroflexed and then rotated with the ultrasound facing posteriorly into the right upper lobe.  There was no obvious vasculature in the way.  We looked at the lesion under Doppler ultrasound to ensure clearance of proximal pulmonary artery distribution.  Using real-time ultrasound guided transbronchial needle aspirations we biopsied both the 4R position first followed by biopsies of the right upper lobe posterior segment mass.  All samples sent for cytologic evaluation.   At the end of the procedure a general airway inspection was performed and there was no evidence of active bleeding.  We switched back from the endobronchial ultrasound scope to a standard therapeutic bronchoscope.  The scope was used for therapeutic suctioning and clearance of the bilateral mainstem's of blood clots.  There was 2 large blood clots at this point occluding the right lower lobe that had to be extracted en bloc with suctioning.  At the termination of the procedure all airways were patent with no evidence of active bleeding.  There was a small adherent clot within the posterior segment of the right upper lobe.  This was left in place.  The bronchoscope was removed.  The patient tolerated the procedure well. There was no significant blood loss and there were no obvious complications. A post-procedural chest x-ray is pending.  Samples: 1. Transbronchial needle brushings from right upper lobe posterior segment 2. Transbronchial Wang needle biopsies from right upper lobe posterior segment 3. Transbronchial forceps biopsies from right upper lobe  posterior segment 4. Bronchoalveolar lavage from right upper lobe posterior segment 5. Wang needle biopsies from station 4R node 6. Wang needle biopsies from right upper lobe mass node  Plans:  The patient will be discharged from the PACU to home when recovered from anesthesia and after chest x-ray is reviewed. We will review the cytology, pathology results with the patient when they become available. Outpatient followup will be with Garner Nash, DO.   Garner Nash, DO [facility] 08/13/2019 10:21 AM

## 2019-08-16 LAB — CYTOLOGY - NON PAP

## 2019-08-16 LAB — SURGICAL PATHOLOGY

## 2019-08-21 ENCOUNTER — Telehealth: Payer: Self-pay | Admitting: Pulmonary Disease

## 2019-08-21 NOTE — Telephone Encounter (Signed)
PCCM:  Attempted to call the patient regarding bronchoscopy results last week and again today. I left a VM for her to return call to our office.   Her biopsy was positive for malignancy. I did explain this to her in person immediately after the bronchoscopy. She did state at that time she was planning to travel to [place] to see family and that she would address the lung abnormalities once she returned from [place].   We will try again next week.   CC: triage pool to attempt to reach to patient again next week.   Garner Nash, DO [facility] 08/21/2019 3:38 PM

## 2019-08-24 NOTE — Telephone Encounter (Signed)
She is in [place] until she has her covid and pft test at end of June. Let her know the results she verbalized understanding. Nothing further needed at this time.

## 2019-08-24 NOTE — Telephone Encounter (Signed)
Called and spoke with patient She is in [place] until her

## 2019-08-25 ENCOUNTER — Telehealth: Payer: Self-pay | Admitting: Pulmonary Disease

## 2019-08-25 DIAGNOSIS — C3492 Malignant neoplasm of unspecified part of left bronchus or lung: Secondary | ICD-10-CM

## 2019-08-25 NOTE — Telephone Encounter (Signed)
PCCM:  I called spoke with the patient regarding her pathology results.  She is out in [place] visiting family.  She returns to [facility] on 16th of June.  She did agree for referral for evaluation to see cardiothoracic surgery.  I have placed this in the system hopefully she could see Dr. Kipp Brood for RATS evaluation.  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 4R lymph node negative on cytology.  Garner Nash, DO [facility] 08/25/2019 12:32 PM

## 2019-08-26 ENCOUNTER — Other Ambulatory Visit: Payer: Self-pay | Admitting: *Deleted

## 2019-08-26 LAB — CYTOLOGY - NON PAP

## 2019-08-26 NOTE — Progress Notes (Signed)
The proposed treatment discussed in cancer conference 08/27/19 is for discussion purpose only and is not a binding recommendation.  The patient was not physically examined nor present for their treatment options.  Therefore, final treatment plans cannot be decided.

## 2019-09-21 ENCOUNTER — Telehealth: Payer: Self-pay | Admitting: *Deleted

## 2019-09-21 NOTE — Telephone Encounter (Signed)
6/8 in [place] will stay at daughters and have surgery there, will not be back until after surgery.

## 2019-10-01 ENCOUNTER — Other Ambulatory Visit (HOSPITAL_COMMUNITY): Payer: Medicare HMO

## 2019-10-07 ENCOUNTER — Encounter: Payer: Medicare HMO | Admitting: Thoracic Surgery (Cardiothoracic Vascular Surgery)

## 2019-10-08 ENCOUNTER — Encounter: Payer: Medicare HMO | Admitting: Thoracic Surgery (Cardiothoracic Vascular Surgery)

## 2020-02-09 ENCOUNTER — Ambulatory Visit: Payer: Medicare HMO | Admitting: Cardiology

## 2020-02-14 ENCOUNTER — Encounter: Payer: Self-pay | Admitting: Pulmonary Disease

## 2020-02-14 ENCOUNTER — Ambulatory Visit (INDEPENDENT_AMBULATORY_CARE_PROVIDER_SITE_OTHER): Payer: Medicare HMO | Admitting: Pulmonary Disease

## 2020-02-14 ENCOUNTER — Other Ambulatory Visit: Payer: Self-pay

## 2020-02-14 VITALS — BP 128/62 | HR 82 | Temp 98.0°F | Ht 66.0 in | Wt 175.4 lb

## 2020-02-14 DIAGNOSIS — C3491 Malignant neoplasm of unspecified part of right bronchus or lung: Secondary | ICD-10-CM | POA: Diagnosis not present

## 2020-02-14 DIAGNOSIS — Z902 Acquired absence of lung [part of]: Secondary | ICD-10-CM | POA: Diagnosis not present

## 2020-02-14 NOTE — Progress Notes (Signed)
Synopsis: Referred in April 2021 for abnormal CT, lung nodule by Lin Landsman, MD  Subjective:   PATIENT ID: Brandi Holmes GENDER: female DOB: 1950/07/27, MRN: [id number]  Chief Complaint  Patient presents with  . Follow-up    Patient had surgery on June 17 and they removed top lobe of right lung, rib fractures in the process. Decided not to do chemo and was told she needs CT scan every 3 months. Was in hospital for a month     This is a 69 year old female past medical history of hypertension, former smoker quit in 2006.  Patient initially presented to the emergency department on 07/31/2019 with right lower quadrant abdominal pain during the patient's evaluation she had CT imaging of the abdomen pelvis.  Prior to the ED visit with neck pain had a CT soft tissue of the neck and chest.  On 07/20/2019 patient CT chest revealed a 2.1 cm spiculated right upper lobe mass concerning for a primary lung cancer.  Patient was referred here for evaluation of lung mass.  OV for 08/05/2019: Patient seen today in the office.  Has lots of questions regarding her CT scan.  A little bit anxious today.  Denies hemoptysis.  Denies weight loss she does complain of a leg pain that has been going on for some time radiates into her groin.  Denies fevers chills.  OV 02/14/2020: She was initially taken for navigational bronchoscopy and tissue diagnosis of the upper lobe mass.  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  She traveled to [facility] to visit with family and establish care with a thoracic surgeon there.  She was subsequently taken to the operating room for robotic right upper lobe lobectomy at [facility] by Dr. Jearld Lesch.  Notes and documentation regarding hospitalization and surgery were reviewed in epic care everywhere.  She was diagnosed with a T1b, N1M0 malignancy with 3+ nodes.  She was referred to medical oncology met with Dr. Cephus Shelling which recommended adjuvant chemotherapy.  She  declined as she was making plans for return to [place].  She is here now in [place].  It took some time for her to recover.  She does have postop pain at the incision sites.  Her breathing is much improved compared to previous.  Denies hemoptysis.    Past Medical History:  Diagnosis Date  . Anemia    as a child only  . Hypertension   . Liver spots    " per pt"  . Lung nodule   . Wears glasses   . Wears partial dentures      Family History  Problem Relation Age of Onset  . Heart disease Mother 25       bypass at age   . Hypertension Mother   . Cancer Maternal Aunt        breast  . Cancer Paternal Aunt   . Diabetes Neg Hx   . Stroke Neg Hx      Past Surgical History:  Procedure Laterality Date  . ABDOMINAL HYSTERECTOMY  1974   heavy bleeding  . ABDOMINAL HYSTERECTOMY    . BRONCHIAL BRUSHINGS  08/13/2019   Procedure: BRONCHIAL BRUSHINGS;  Surgeon: Garner Nash, DO;  Location: [facility];  Service: Pulmonary;;  . BRONCHIAL WASHINGS  08/13/2019   Procedure: BRONCHIAL WASHINGS;  Surgeon: Garner Nash, DO;  Location: [facility];  Service: Pulmonary;;  . COLONOSCOPY W/ BIOPSIES AND POLYPECTOMY    . DILATION AND CURETTAGE OF UTERUS    .  FINE NEEDLE ASPIRATION  08/13/2019   Procedure: FINE NEEDLE ASPIRATION (FNA) LINEAR;  Surgeon: Garner Nash, DO;  Location: [facility];  Service: Pulmonary;;  . FOOT SURGERY     bilateral bunions and hammer toes  . LUNG BIOPSY  08/13/2019   Procedure: LUNG BIOPSY;  Surgeon: Garner Nash, DO;  Location: [facility];  Service: Pulmonary;;  . MULTIPLE TOOTH EXTRACTIONS    . VIDEO BRONCHOSCOPY WITH ENDOBRONCHIAL NAVIGATION Right 08/13/2019   Procedure: VIDEO BRONCHOSCOPY WITH ENDOBRONCHIAL NAVIGATION;  Surgeon: Garner Nash, DO;  Location: [facility];  Service: Pulmonary;  Laterality: Right;  Marland Kitchen VIDEO BRONCHOSCOPY WITH ENDOBRONCHIAL ULTRASOUND Right 08/13/2019   Procedure: VIDEO BRONCHOSCOPY WITH ENDOBRONCHIAL  ULTRASOUND;  Surgeon: Garner Nash, DO;  Location: [facility];  Service: Pulmonary;  Laterality: Right;    Social History   Socioeconomic History  . Marital status: Divorced    Spouse name: Not on file  . Number of children: 2  . Years of education: Not on file  . Highest education level: Not on file  Occupational History  . Not on file  Tobacco Use  . Smoking status: Former Smoker    Packs/day: 0.30    Years: 50.00    Pack years: 15.00    Types: Cigarettes    Quit date: 2016    Years since quitting: 5.8  . Smokeless tobacco: Never Used  Vaping Use  . Vaping Use: Never used  Substance and Sexual Activity  . Alcohol use: Not Currently    Comment: occasional  . Drug use: Yes    Types: Oxycodone  . Sexual activity: Not Currently  Other Topics Concern  . Not on file  Social History Narrative   ** Merged History Encounter **       Lives alone.  Divoced.  Adult children- 1 living, 1 deceased.   Social Determinants of Health   Financial Resource Strain:   . Difficulty of Paying Living Expenses: Not on file  Food Insecurity:   . Worried About Charity fundraiser in the Last Year: Not on file  . Ran Out of Food in the Last Year: Not on file  Transportation Needs:   . Lack of Transportation (Medical): Not on file  . Lack of Transportation (Non-Medical): Not on file  Physical Activity:   . Days of Exercise per Week: Not on file  . Minutes of Exercise per Session: Not on file  Stress:   . Feeling of Stress : Not on file  Social Connections:   . Frequency of Communication with Friends and Family: Not on file  . Frequency of Social Gatherings with Friends and Family: Not on file  . Attends Religious Services: Not on file  . Active Member of Clubs or Organizations: Not on file  . Attends Archivist Meetings: Not on file  . Marital Status: Not on file  Intimate Partner Violence:   . Fear of Current or Ex-Partner: Not on file  . Emotionally Abused: Not on  file  . Physically Abused: Not on file  . Sexually Abused: Not on file     Allergies  Allergen Reactions  . Crab [Shellfish Allergy] Swelling    Swelling of feet  . Morphine And Related     Bad dreams     Outpatient Medications Prior to Visit  Medication Sig Dispense Refill  . acetaminophen (TYLENOL) 500 MG tablet Take 500 mg by mouth every 6 (six) hours as needed for moderate pain or headache.     Marland Kitchen  amLODipine (NORVASC) 5 MG tablet Take 1 tablet (5 mg total) by mouth every evening. 90 tablet 0  . ascorbic acid (VITAMIN C) 500 MG tablet Take 500 mg by mouth 3 (three) times a week.    Marland Kitchen aspirin 325 MG tablet Take 325 mg by mouth 3 (three) times a week.    . calcium carbonate (OSCAL) 1500 (600 Ca) MG TABS tablet Take 600 mg of elemental calcium by mouth 3 (three) times a week.    . gabapentin (NEURONTIN) 300 MG capsule Take 1 capsule by mouth as needed. Every 8 hours    . lisinopril (PRINIVIL,ZESTRIL) 10 MG tablet Take 20 mg by mouth in the morning.    . magnesium 30 MG tablet Take 30 mg by mouth 2 (two) times daily.    . Multiple Vitamins-Minerals (MULTIVITAMIN WITH MINERALS) tablet Take 1 tablet by mouth daily.    Marland Kitchen nystatin-triamcinolone ointment (MYCOLOG) Apply 1 application topically in the morning and at bedtime.    Marland Kitchen POTASSIUM PO Take 1 tablet by mouth daily.    . traMADol (ULTRAM) 50 MG tablet Take 1 tablet by mouth as needed. Every 8 hours    . metoCLOPramide (REGLAN) 10 MG tablet Take 1 tablet (10 mg total) by mouth 3 (three) times daily as needed for nausea (headache / nausea). 20 tablet 0  . oxyCODONE-acetaminophen (PERCOCET) 7.5-325 MG tablet Take 1 tablet by mouth in the morning, at noon, and at bedtime.     No facility-administered medications prior to visit.    Review of Systems  Constitutional: Negative for chills, fever, malaise/fatigue and weight loss.  HENT: Negative for hearing loss, sore throat and tinnitus.   Eyes: Negative for blurred vision and double  vision.  Respiratory: Positive for shortness of breath. Negative for cough, hemoptysis, sputum production, wheezing and stridor.   Cardiovascular: Negative for chest pain, palpitations, orthopnea, leg swelling and PND.  Gastrointestinal: Negative for abdominal pain, constipation, diarrhea, heartburn, nausea and vomiting.  Genitourinary: Negative for dysuria, hematuria and urgency.  Musculoskeletal: Negative for joint pain and myalgias.  Skin: Negative for itching and rash.  Neurological: Negative for dizziness, tingling, weakness and headaches.  Endo/Heme/Allergies: Negative for environmental allergies. Does not bruise/bleed easily.  Psychiatric/Behavioral: Negative for depression. The patient is not nervous/anxious and does not have insomnia.   All other systems reviewed and are negative.    Objective:  Physical Exam Vitals reviewed.  Constitutional:      General: She is not in acute distress.    Appearance: She is well-developed. She is obese.  HENT:     Head: Normocephalic and atraumatic.  Eyes:     General: No scleral icterus.    Conjunctiva/sclera: Conjunctivae normal.     Pupils: Pupils are equal, round, and reactive to light.  Neck:     Vascular: No JVD.     Trachea: No tracheal deviation.  Cardiovascular:     Rate and Rhythm: Normal rate and regular rhythm.     Heart sounds: Normal heart sounds. No murmur heard.   Pulmonary:     Effort: Pulmonary effort is normal. No tachypnea, accessory muscle usage or respiratory distress.     Breath sounds: No stridor. No wheezing, rhonchi or rales.  Abdominal:     General: Bowel sounds are normal. There is no distension.     Palpations: Abdomen is soft.     Tenderness: There is no abdominal tenderness.  Musculoskeletal:        General: No tenderness.  Cervical back: Neck supple.  Lymphadenopathy:     Cervical: No cervical adenopathy.  Skin:    General: Skin is warm and dry.     Capillary Refill: Capillary refill takes  less than 2 seconds.     Findings: No rash.  Neurological:     Mental Status: She is alert and oriented to person, place, and time.  Psychiatric:        Behavior: Behavior normal.      Vitals:   02/14/20 1428  BP: 128/62  Pulse: 82  Temp: 98 F (36.7 C)  TempSrc: Temporal  SpO2: 97%  Weight: 175 lb 6.4 oz (79.6 kg)  Height: _0  (1.676 m)   97% on RA BMI Readings from Last 3 Encounters:  02/14/20 28.31 kg/m  08/13/19 30.18 kg/m  08/06/19 30.62 kg/m   Wt Readings from Last 3 Encounters:  02/14/20 175 lb 6.4 oz (79.6 kg)  08/13/19 187 lb (84.8 kg)  08/06/19 184 lb (83.5 kg)     CBC    Component Value Date/Time   WBC 6.9 08/13/2019 0605   RBC 4.41 08/13/2019 0605   HGB 12.7 08/13/2019 0605   HCT 41.2 08/13/2019 0605   PLT 309 08/13/2019 0605   MCV 93.4 08/13/2019 0605   MCH 28.8 08/13/2019 0605   MCHC 30.8 08/13/2019 0605   RDW 12.2 08/13/2019 0605   LYMPHSABS 1.7 07/31/2019 1721   MONOABS 0.6 07/31/2019 1721   EOSABS 0.0 07/31/2019 1721   BASOSABS 0.1 07/31/2019 1721    Chest Imaging: 07/20/2019 CT chest 2.1 cm spiculated right upper lobe mass. The patient's images have been independently reviewed by me.    CT scan of the chest October 28, 2019 completed at [facility] results reviewed in epic care everywhere Postoperative changes documented.  Atherosclerotic coronary disease, small right effusion minimal clustered nodularity within the left upper lobe unchanged from previous.  Pulmonary Functions Testing Results: No flowsheet data found.  FeNO: none  Pathology:   June 17th Path report from Surgery Histologic Type: Invasive adenocarcinoma, acinar and solid patterns Grade (G1-G4): G2 Spread Through Air Spaces (STAS): Present Visceral Pleural Invasion: Not identified Lymphovascular Invasion: Present Direct Invasion of Adjacent Structures: No adjacent structures present Margin Status: Bronchial Margin: Uninvolved by adenocarcinoma Vascular  Margin: Uninvolved by adenocarcinoma Parenchymal Margin: Uninvolved by adenocarcinoma Other Attached Tissue Margin: Uninvolved by adenocarcinoma Closest resection margin (specify which, in cm):1.5 cm from parenchymal margin Treatment Effect: No known presurgical therapy Primary Tumor (pT): pT1b Regional Lymph Node (pN): pN1 # of LNs Examined: 9 Specify nodal station(s) examined: 4R, 7R, 9R, 10R, 11R # of Positive LNs: 3 Specify nodal station(s) involved: 10R, 11R, 12R   Echocardiogram: none  Heart Catheterization: none     Assessment & Plan:     ICD-10-CM   1. Adenocarcinoma of right lung [facility])  C34.91 CT Chest Wo Contrast    Ambulatory referral to Oncology  2. S/P RUL lobectomy of lung  Z90.2 CT Chest Wo Contrast    Ambulatory referral to Oncology   Dr. Satira Mccallum, [facility] June 17th 2021    Assessment:   This is a 69 year old female who underwent navigational bronchoscopy in the spring for a 2.1 cm spiculated right upper lobe pulmonary nodule consistent with a diagnosis of adenocarcinoma.  Ultimately the patient traveled to [facility] to visit family right after this procedure.  And she was directed to establish care with a thoracic surgeon.  Out there she met with Dr. Ivory Broad who took  the patient for the OR for a robotic lobectomy.  She was diagnosed with a T1BN1, M0, adenocarcinoma of the lung.  There was 3+ lymph nodes, 10 R 11 R 12 R.  She was referred for adjuvant chemotherapy and met with medical oncology there.  Records reviewed.  An extensive amount of time was spent today during this office visit reviewing records from [facility] [place] [facility].  Plan: We appreciate the excellent care the patient received at [facility]. Overall she is doing well recovering from her robotic lobectomy. Due to the positivity of nodes I believe she needs to meet and reevaluate the decision on adjuvant chemotherapy. We will refer her  here to medical oncology. Continue current inhaler regimen. I have ordered a repeat noncontrast CT of the chest as it has been 3 months since surgery.  Patient can return to see me in 3 months.  I spent 45 minutes dedicated to the care of this patient on the date of this encounter to include pre-visit review of records, face-to-face time with the patient discussing conditions above, post visit ordering of testing, clinical documentation with the electronic health record, making appropriate referrals as documented, and communicating necessary findings to members of the patients care team.     Current Outpatient Medications:  .  acetaminophen (TYLENOL) 500 MG tablet, Take 500 mg by mouth every 6 (six) hours as needed for moderate pain or headache. , Disp: , Rfl:  .  amLODipine (NORVASC) 5 MG tablet, Take 1 tablet (5 mg total) by mouth every evening., Disp: 90 tablet, Rfl: 0 .  ascorbic acid (VITAMIN C) 500 MG tablet, Take 500 mg by mouth 3 (three) times a week., Disp: , Rfl:  .  aspirin 325 MG tablet, Take 325 mg by mouth 3 (three) times a week., Disp: , Rfl:  .  calcium carbonate (OSCAL) 1500 (600 Ca) MG TABS tablet, Take 600 mg of elemental calcium by mouth 3 (three) times a week., Disp: , Rfl:  .  gabapentin (NEURONTIN) 300 MG capsule, Take 1 capsule by mouth as needed. Every 8 hours, Disp: , Rfl:  .  lisinopril (PRINIVIL,ZESTRIL) 10 MG tablet, Take 20 mg by mouth in the morning., Disp: , Rfl:  .  magnesium 30 MG tablet, Take 30 mg by mouth 2 (two) times daily., Disp: , Rfl:  .  Multiple Vitamins-Minerals (MULTIVITAMIN WITH MINERALS) tablet, Take 1 tablet by mouth daily., Disp: , Rfl:  .  nystatin-triamcinolone ointment (MYCOLOG), Apply 1 application topically in the morning and at bedtime., Disp: , Rfl:  .  POTASSIUM PO, Take 1 tablet by mouth daily., Disp: , Rfl:  .  traMADol (ULTRAM) 50 MG tablet, Take 1 tablet by mouth as needed. Every 8 hours, Disp: , Rfl:    Garner Nash,  DO [facility] 02/14/2020 2:35 PM

## 2020-02-14 NOTE — Patient Instructions (Signed)
Thank you for visiting Dr. Valeta Harms at [facility]. Today we recommend the following:  Orders Placed This Encounter  Procedures  . CT Chest Wo Contrast  . Ambulatory referral to Oncology   Ct scan pending, to be scheduled soon.   Return in about 6 months (around 08/13/2020) for Dr. Valeta Harms .    Please do your part to reduce the spread of COVID-19.

## 2020-02-15 ENCOUNTER — Ambulatory Visit: Payer: Medicare HMO | Admitting: Cardiology

## 2020-02-15 ENCOUNTER — Encounter: Payer: Self-pay | Admitting: *Deleted

## 2020-02-15 DIAGNOSIS — R590 Localized enlarged lymph nodes: Secondary | ICD-10-CM

## 2020-02-15 NOTE — Progress Notes (Signed)
I received referral on Brandi Holmes today.  I updated new patient coordinator to call her and schedule her to be seen on 02/28/20 with labs.

## 2020-02-16 ENCOUNTER — Telehealth: Payer: Self-pay | Admitting: Internal Medicine

## 2020-02-16 NOTE — Telephone Encounter (Signed)
Received a new pt referral from Dr. Valeta Harms for adenocarcinoma of the lung. Brandi Holmes has been cld and scheduled to see Dr. Julien Nordmann on 11/15 at 2:15pm w/labs at 1:45pm.

## 2020-02-22 ENCOUNTER — Ambulatory Visit: Payer: Medicare HMO | Admitting: Cardiology

## 2020-02-22 ENCOUNTER — Other Ambulatory Visit: Payer: Self-pay

## 2020-02-22 ENCOUNTER — Encounter: Payer: Self-pay | Admitting: Cardiology

## 2020-02-22 VITALS — BP 130/72 | HR 62 | Resp 16 | Ht 66.0 in | Wt 176.0 lb

## 2020-02-22 DIAGNOSIS — I10 Essential (primary) hypertension: Secondary | ICD-10-CM

## 2020-02-22 DIAGNOSIS — Z87891 Personal history of nicotine dependence: Secondary | ICD-10-CM

## 2020-02-22 NOTE — Progress Notes (Signed)
Brandi Holmes Date of Birth: [birth year] MRN: [id number] Primary Care Provider:Reese, Zadie Cleverly, MD Primary Cardiologist: Rex Kras, DO (established care 07/15/2019)  Date: 02/22/20 Last Office Visit: 08/06/2019  Chief Complaint  Patient presents with  . Hypertension  . Follow-up    6 month    HPI  Brandi Holmes is a 69 y.o. female who presents to the office with a chief complaint of " blood pressure management." Patient's past medical history and cardiac risk factors include: Hypertension, former smoker, postmenopausal female, advanced age, obesity.  Patient was referred to the office for management of blood pressure at the request of her PCP.  Hypertension: Patient states that she was diagnosed with high blood pressure approximately 15 years ago.  Since last office visit patient states her blood pressure is very well controlled.  She is currently on amlodipine and hydrochlorothiazide.patient states that she is adamant on following a low-salt diet.  No recent hospitalization for cardiovascular symptoms.  Since last office visit patient was diagnosed with right-sided lung cancer and underwent lobectomy with lymph node excision and surgery took place at [facility] in [place].  Now she follows up with her pulmonologist in town.    No premature coronary artery disease in the family.     FUNCTIONAL STATUS: No structured exercise program or daily routine.    ALLERGIES: Allergies  Allergen Reactions  . Crab [Shellfish Allergy] Swelling    Swelling of feet  . Morphine And Related     Bad dreams     MEDICATION LIST PRIOR TO VISIT: Current Outpatient Medications on File Prior to Visit  Medication Sig Dispense Refill  . acetaminophen (TYLENOL) 500 MG tablet Take 500 mg by mouth every 6 (six) hours as needed for moderate pain or headache.     Marland Kitchen amLODipine (NORVASC) 5 MG tablet Take 1 tablet (5 mg total) by mouth every evening. 90 tablet 0  . aspirin  325 MG tablet Take 325 mg by mouth as needed.     . calcium carbonate (OSCAL) 1500 (600 Ca) MG TABS tablet Take 600 mg of elemental calcium by mouth 3 (three) times a week.    . gabapentin (NEURONTIN) 300 MG capsule Take 1 capsule by mouth as needed. Every 8 hours    . lisinopril (PRINIVIL,ZESTRIL) 10 MG tablet Take 20 mg by mouth in the morning.    . Multiple Vitamins-Minerals (MULTIVITAMIN WITH MINERALS) tablet Take 1 tablet by mouth daily.    Marland Kitchen nystatin-triamcinolone ointment (MYCOLOG) Apply 1 application topically in the morning and at bedtime.    Marland Kitchen oxybutynin (DITROPAN) 5 MG tablet Take 5 mg by mouth as needed for bladder spasms.    . tizanidine (ZANAFLEX) 2 MG capsule Take 2 mg by mouth 3 (three) times daily.    . traMADol (ULTRAM) 50 MG tablet Take 1 tablet by mouth as needed. Every 8 hours     No current facility-administered medications on file prior to visit.    PAST MEDICAL HISTORY: Past Medical History:  Diagnosis Date  . Anemia    as a child only  . Hypertension   . Liver spots    " per pt"  . Lung nodule   . Wears glasses   . Wears partial dentures     PAST SURGICAL HISTORY: Past Surgical History:  Procedure Laterality Date  . ABDOMINAL HYSTERECTOMY  1974   heavy bleeding  . ABDOMINAL HYSTERECTOMY    . BRONCHIAL BRUSHINGS  08/13/2019   Procedure: BRONCHIAL BRUSHINGS;  Surgeon:  Garner Nash, DO;  Location: [place] [facility];  Service: Pulmonary;;  . BRONCHIAL WASHINGS  08/13/2019   Procedure: BRONCHIAL WASHINGS;  Surgeon: Garner Nash, DO;  Location: [place] [facility];  Service: Pulmonary;;  . COLONOSCOPY W/ BIOPSIES AND POLYPECTOMY    . DILATION AND CURETTAGE OF UTERUS    . FINE NEEDLE ASPIRATION  08/13/2019   Procedure: FINE NEEDLE ASPIRATION (FNA) LINEAR;  Surgeon: Garner Nash, DO;  Location: [place] [facility];  Service: Pulmonary;;  . FOOT SURGERY     bilateral bunions and hammer toes  . LUNG BIOPSY  08/13/2019   Procedure: LUNG BIOPSY;  Surgeon: Garner Nash, DO;  Location: [place] [facility];  Service: Pulmonary;;  . MULTIPLE TOOTH EXTRACTIONS    . VIDEO BRONCHOSCOPY WITH ENDOBRONCHIAL NAVIGATION Right 08/13/2019   Procedure: VIDEO BRONCHOSCOPY WITH ENDOBRONCHIAL NAVIGATION;  Surgeon: Garner Nash, DO;  Location: [place];  Service: Pulmonary;  Laterality: Right;  Marland Kitchen VIDEO BRONCHOSCOPY WITH ENDOBRONCHIAL ULTRASOUND Right 08/13/2019   Procedure: VIDEO BRONCHOSCOPY WITH ENDOBRONCHIAL ULTRASOUND;  Surgeon: Garner Nash, DO;  Location: [place];  Service: Pulmonary;  Laterality: Right;    FAMILY HISTORY: The patient family history includes Cancer in her maternal aunt and paternal aunt; Heart disease (age of onset: 46) in her mother; Hypertension in her mother.   SOCIAL HISTORY:  The patient  reports that she quit smoking about 5 years ago. Her smoking use included cigarettes. She has a 15.00 pack-year smoking history. She has never used smokeless tobacco. She reports previous alcohol use. She reports current drug use. Drug: Oxycodone.  Review of Systems  Constitutional: Negative for chills and fever.  HENT: Negative for hoarse voice and nosebleeds.   Eyes: Negative for discharge, double vision and pain.  Cardiovascular: Negative for chest pain, claudication, dyspnea on exertion, leg swelling, near-syncope, orthopnea, palpitations, paroxysmal nocturnal dyspnea and syncope.  Respiratory: Negative for hemoptysis and shortness of breath.   Musculoskeletal: Negative for muscle cramps and myalgias.  Gastrointestinal: Negative for abdominal pain, constipation, diarrhea, hematemesis, hematochezia, melena, nausea and vomiting.  Neurological: Negative for dizziness and light-headedness.   PHYSICAL EXAM: Vitals with BMI 02/22/2020 02/14/2020 08/13/2019  Height 5\' 6"  5\' 6"  -  Weight 176 lbs 175 lbs 6 oz -  BMI 84.13 24.40 -  Systolic 102 725 366  Diastolic 72 62 66  Pulse 62 82 63   CONSTITUTIONAL: Well-developed and well-nourished. No acute  distress.  SKIN: Skin is warm and dry. No rash noted. No cyanosis. No pallor. No jaundice HEAD: Normocephalic and atraumatic.  EYES: No scleral icterus MOUTH/THROAT: Moist oral membranes.  NECK: No JVD present. No thyromegaly noted. No carotid bruits  LYMPHATIC: No visible cervical adenopathy.  CHEST Normal respiratory effort. No intercostal retractions  LUNGS: Clear to auscultation bilaterally.  No stridor. No wheezes. No rales.  CARDIOVASCULAR: Regular rate and rhythm, positive S1-S2, no murmurs rubs or gallops appreciated ABDOMINAL: No apparent ascites.  EXTREMITIES: No peripheral edema  HEMATOLOGIC: No significant bruising NEUROLOGIC: Oriented to person, place, and time. Nonfocal. Normal muscle tone.  PSYCHIATRIC: Normal mood and affect. Normal behavior. Cooperative  CARDIAC DATABASE: EKG: 02/22/2020: Normal sinus rhythm, normal axis, without underlying ischemia or injury pattern.  Echocardiogram: 07/27/2019: LVEF 44%, grade 1 diastolic impairment, mild LVH, mild TR, RVSP 26 mmHg.  Stress Testing:  None  Heart Catheterization: None  LABORATORY DATA: CBC Latest Ref Rng & Units 08/13/2019 07/31/2019 07/25/2019  WBC 4.0 - 10.5 K/uL 6.9 15.8(H) 13.5(H)  Hemoglobin 12.0 - 15.0 g/dL 12.7 14.1 14.9  Hematocrit 36 - 46 % 41.2 44.5 47.8(H)  Platelets 150 - 400 K/uL 309 341 352    CMP Latest Ref Rng & Units 08/13/2019 07/31/2019 07/25/2019  Glucose 70 - 99 mg/dL 121(H) 153(H) 118(H)  BUN 8 - 23 mg/dL 9 13 10   Creatinine 0.44 - 1.00 mg/dL 1.03(H) 0.97 0.88  Sodium 135 - 145 mmol/L 141 136 134(L)  Potassium 3.5 - 5.1 mmol/L 4.1 3.6 4.3  Chloride 98 - 111 mmol/L 110 104 102  CO2 22 - 32 mmol/L 24 22 20(L)  Calcium 8.9 - 10.3 mg/dL 9.6 9.3 9.6  Total Protein 6.5 - 8.1 g/dL 6.9 8.0 8.0  Total Bilirubin 0.3 - 1.2 mg/dL 0.6 1.0 0.7  Alkaline Phos 38 - 126 U/L 43 48 53  AST 15 - 41 U/L 18 20 21   ALT 0 - 44 U/L 14 14 14     Lipid Panel     Component Value Date/Time   CHOL 165  09/16/2011 1000   TRIG 84 09/16/2011 1000   HDL 45 09/16/2011 1000   CHOLHDL 3.7 09/16/2011 1000   VLDL 17 09/16/2011 1000   LDLCALC 103 (H) 09/16/2011 1000    No results found for: HGBA1C No components found for: NTPROBNP No results found for: TSH  FINAL MEDICATION LIST END OF ENCOUNTER: No orders of the defined types were placed in this encounter.   Medications Discontinued During This Encounter  Medication Reason  . amLODipine-benazepril (LOTREL) 10-20 MG capsule Change in therapy  . magnesium 30 MG tablet Patient Preference  . POTASSIUM PO Discontinued by provider  . ascorbic acid (VITAMIN C) 500 MG tablet Patient Preference     Current Outpatient Medications:  .  acetaminophen (TYLENOL) 500 MG tablet, Take 500 mg by mouth every 6 (six) hours as needed for moderate pain or headache. , Disp: , Rfl:  .  amLODipine (NORVASC) 5 MG tablet, Take 1 tablet (5 mg total) by mouth every evening., Disp: 90 tablet, Rfl: 0 .  aspirin 325 MG tablet, Take 325 mg by mouth as needed. , Disp: , Rfl:  .  calcium carbonate (OSCAL) 1500 (600 Ca) MG TABS tablet, Take 600 mg of elemental calcium by mouth 3 (three) times a week., Disp: , Rfl:  .  gabapentin (NEURONTIN) 300 MG capsule, Take 1 capsule by mouth as needed. Every 8 hours, Disp: , Rfl:  .  lisinopril (PRINIVIL,ZESTRIL) 10 MG tablet, Take 20 mg by mouth in the morning., Disp: , Rfl:  .  Multiple Vitamins-Minerals (MULTIVITAMIN WITH MINERALS) tablet, Take 1 tablet by mouth daily., Disp: , Rfl:  .  nystatin-triamcinolone ointment (MYCOLOG), Apply 1 application topically in the morning and at bedtime., Disp: , Rfl:  .  oxybutynin (DITROPAN) 5 MG tablet, Take 5 mg by mouth as needed for bladder spasms., Disp: , Rfl:  .  tizanidine (ZANAFLEX) 2 MG capsule, Take 2 mg by mouth 3 (three) times daily., Disp: , Rfl:  .  traMADol (ULTRAM) 50 MG tablet, Take 1 tablet by mouth as needed. Every 8 hours, Disp: , Rfl:   IMPRESSION:    ICD-10-CM   1.  Essential hypertension  I10 EKG 12-Lead  2. Former smoker  Z87.891      RECOMMENDATIONS: Emoni Whitworth is a 69 y.o. female whose past medical history and cardiac risk factors include: Hypertension, former smoker, postmenopausal female, advanced age, obesity.  Benign essential hypertension:  EKG illustrates normal sinus rhythm without underlying ischemia or injury pattern.  Continue current antihypertensive medications.  Office  blood pressures within acceptable range.  Educated on the importance of low-salt diet.  She is asked to call the office if her blood pressures are consistently greater than 140 mmHg.  Former smoker: Educated on importance of continued smoking cessation.  Orders Placed This Encounter  Procedures  . EKG 12-Lead   --Continue cardiac medications as reconciled in final medication list. --Return in about 6 months (around 08/21/2020) for BP. Or sooner if needed. --Continue follow-up with your primary care physician regarding the management of your other chronic comorbid conditions.  Patient's questions and concerns were addressed to her satisfaction. She voices understanding of the instructions provided during this encounter.   This note was created using a voice recognition software as a result there may be grammatical errors inadvertently enclosed that do not reflect the nature of this encounter. Every attempt is made to correct such errors.  Rex Kras, [place], [facility]  Pager: [phone] Office: [phone]

## 2020-02-24 ENCOUNTER — Ambulatory Visit (HOSPITAL_COMMUNITY)
Admission: RE | Admit: 2020-02-24 | Discharge: 2020-02-24 | Disposition: A | Discharge status: Home / Self Care | Payer: Medicare HMO | Source: Ambulatory Visit | Attending: Pulmonary Disease | Admitting: Pulmonary Disease

## 2020-02-24 ENCOUNTER — Other Ambulatory Visit: Payer: Self-pay

## 2020-02-24 DIAGNOSIS — Z902 Acquired absence of lung [part of]: Secondary | ICD-10-CM | POA: Insufficient documentation

## 2020-02-24 DIAGNOSIS — C3491 Malignant neoplasm of unspecified part of right bronchus or lung: Secondary | ICD-10-CM | POA: Diagnosis present

## 2020-02-28 ENCOUNTER — Other Ambulatory Visit: Payer: Self-pay

## 2020-02-28 ENCOUNTER — Encounter: Payer: Self-pay | Admitting: Internal Medicine

## 2020-02-28 ENCOUNTER — Inpatient Hospital Stay (HOSPITAL_BASED_OUTPATIENT_CLINIC_OR_DEPARTMENT_OTHER): Payer: Medicare HMO | Admitting: Internal Medicine

## 2020-02-28 ENCOUNTER — Inpatient Hospital Stay: Payer: Medicare HMO | Attending: Internal Medicine

## 2020-02-28 DIAGNOSIS — Z87891 Personal history of nicotine dependence: Secondary | ICD-10-CM | POA: Insufficient documentation

## 2020-02-28 DIAGNOSIS — I712 Thoracic aortic aneurysm, without rupture: Secondary | ICD-10-CM | POA: Insufficient documentation

## 2020-02-28 DIAGNOSIS — I10 Essential (primary) hypertension: Secondary | ICD-10-CM | POA: Insufficient documentation

## 2020-02-28 DIAGNOSIS — Z803 Family history of malignant neoplasm of breast: Secondary | ICD-10-CM | POA: Diagnosis not present

## 2020-02-28 DIAGNOSIS — J439 Emphysema, unspecified: Secondary | ICD-10-CM | POA: Insufficient documentation

## 2020-02-28 DIAGNOSIS — C3490 Malignant neoplasm of unspecified part of unspecified bronchus or lung: Secondary | ICD-10-CM

## 2020-02-28 DIAGNOSIS — Z7982 Long term (current) use of aspirin: Secondary | ICD-10-CM | POA: Diagnosis not present

## 2020-02-28 DIAGNOSIS — C3411 Malignant neoplasm of upper lobe, right bronchus or lung: Secondary | ICD-10-CM | POA: Insufficient documentation

## 2020-02-28 DIAGNOSIS — Z90710 Acquired absence of both cervix and uterus: Secondary | ICD-10-CM | POA: Diagnosis not present

## 2020-02-28 DIAGNOSIS — Z79899 Other long term (current) drug therapy: Secondary | ICD-10-CM | POA: Diagnosis not present

## 2020-02-28 DIAGNOSIS — Z8249 Family history of ischemic heart disease and other diseases of the circulatory system: Secondary | ICD-10-CM | POA: Diagnosis not present

## 2020-02-28 DIAGNOSIS — C3491 Malignant neoplasm of unspecified part of right bronchus or lung: Secondary | ICD-10-CM | POA: Diagnosis not present

## 2020-02-28 DIAGNOSIS — R590 Localized enlarged lymph nodes: Secondary | ICD-10-CM

## 2020-02-28 LAB — CMP (CANCER CENTER ONLY)
ALT: 8 U/L (ref 0–44)
AST: 15 U/L (ref 15–41)
Albumin: 3.6 g/dL (ref 3.5–5.0)
Alkaline Phosphatase: 70 U/L (ref 38–126)
Anion gap: 7 (ref 5–15)
BUN: 10 mg/dL (ref 8–23)
CO2: 27 mmol/L (ref 22–32)
Calcium: 9.4 mg/dL (ref 8.9–10.3)
Chloride: 106 mmol/L (ref 98–111)
Creatinine: 0.96 mg/dL (ref 0.44–1.00)
GFR, Estimated: >60 mL/min (ref >60)
Glucose, Bld: 87 mg/dL (ref 70–99)
Potassium: 4.4 mmol/L (ref 3.5–5.1)
Sodium: 140 mmol/L (ref 135–145)
Total Bilirubin: 0.7 mg/dL (ref 0.3–1.2)
Total Protein: 7.4 g/dL (ref 6.5–8.1)

## 2020-02-28 LAB — CBC WITH DIFFERENTIAL (CANCER CENTER ONLY)
Abs Immature Granulocytes: 0.02 10*3/uL (ref 0.00–0.07)
Basophils Absolute: 0.1 10*3/uL (ref 0.0–0.1)
Basophils Relative: 1 %
Eosinophils Absolute: 0.3 10*3/uL (ref 0.0–0.5)
Eosinophils Relative: 3 %
HCT: 42.7 % (ref 36.0–46.0)
Hemoglobin: 12.9 g/dL (ref 12.0–15.0)
Immature Granulocytes: 0 %
Lymphocytes Relative: 33 %
Lymphs Abs: 2.7 10*3/uL (ref 0.7–4.0)
MCH: 27 pg (ref 26.0–34.0)
MCHC: 30.2 g/dL (ref 30.0–36.0)
MCV: 89.5 fL (ref 80.0–100.0)
Monocytes Absolute: 0.7 10*3/uL (ref 0.1–1.0)
Monocytes Relative: 9 %
Neutro Abs: 4.6 10*3/uL (ref 1.7–7.7)
Neutrophils Relative %: 54 %
Platelet Count: 193 10*3/uL (ref 150–400)
RBC: 4.77 MIL/uL (ref 3.87–5.11)
RDW: 13.7 % (ref 11.5–15.5)
WBC Count: 8.4 10*3/uL (ref 4.0–10.5)
nRBC: 0 % (ref 0.0–0.2)

## 2020-02-28 NOTE — Progress Notes (Signed)
[place] [phone]   Fax:(336) [phone]  CONSULT NOTE  REFERRING PHYSICIAN: Dr. Leory Plowman Icard  REASON FOR CONSULTATION:  69 years old African-American female recently diagnosed with lung cancer.  HPI Brandi Holmes is a 69 y.o. female with past medical history significant for hypertension, anemia as well as long history of smoking but quit in 2016.  The patient mentions that in March 2021 she felt a lump in the left side of the neck.  She was seen by her primary care physician who referred her to orthopedic surgery for evaluation.  CT scan of the neck was performed on 07/20/2019 and it was unremarkable.  The patient also had CT scan of the chest on the same day and it showed a 2.1 cm spiculated mass in the right upper lobe.  This is highly concerning for bronchogenic carcinoma.  There was indeterminate 0.9 cm left-sided breast nodule and follow-up with outpatient mammography was recommended.  There was mildly prominent right hilar and mediastinal lymph nodes.  A PET scan was performed on 08/12/2019 and it showed markedly hypermetabolic nodule in the right upper lobe just above the hilum measured 1.6 x 1.6 cm.  There was right paratracheal lymph node measuring 0.8 cm with SUV max of 3.2.  The left level 2 lymph node with moderate uptake are of uncertain significance but unlikely to be related to the pulmonary findings.  On March 14, 2020 the patient underwent video bronchoscopy with electromagnetic navigation procedure under the care of Dr. Valeta Harms and the final pathology [id number]) was consistent with adenocarcinoma. For some reason the patient traveled to [facility] to be close to her daughter and during that time she underwent robotic right upper lobectomy with lymph node dissection on September 30, 2019.  The final pathology revealed a (T1b, N1, M0) non-small cell lung cancer, adenocarcinoma.  Unfortunately I do not have any records of the procedure or  pathology report but these are from the history with the patient as well as Dr. Juline Patch notes. The patient was seen by a medical oncologist in [place] who recommended for her adjuvant systemic chemotherapy but she declined. She returned back home recently and she is feeling fine except for the pain on the right side of the chest from the site of the surgical scar. She denied having any other concerning complaints. She had repeat CT scan of the chest on February 25, 2020 that showed the surgical changes from a right upper lobe resection and no findings suspicious for recurrent disease.  There was right paramediastinal and subpleural atelectasis and mild bronchiectasis.  There was a slight interval enlargement of precarinal lymph nodes. Dr. Valeta Harms referred the patient to me today for evaluation and recommendation regarding her condition. When seen today she is feeling fine today with no concerning complaints except for the pain from the right side surgical scar.  She denied having any nausea, vomiting, diarrhea or constipation.  She continues to have mild shortness of breath with exertion but no cough or hemoptysis.  She has no weight loss or night sweats.  She has no headache or visual changes. Family history significant for mother with heart disease and hypertension.  2 paternal aunts and 1 maternal aunt with breast cancer. The patient is a widow after 2 marriages.  She has 2 children including her daughter in [place]. She is currently retired and used to work for [employer].  She has a history of smoking 1 pack/day for around 50 years and  quit April 02, 2015. She also has a history of alcohol drinking and quit in April 2021.  No history of drug abuse. HPI  Past Medical History:  Diagnosis Date  . Anemia    as a child only  . Hypertension   . Liver spots    " per pt"  . Lung nodule   . Wears glasses   . Wears partial dentures     Past Surgical History:  Procedure Laterality Date  .  ABDOMINAL HYSTERECTOMY  1974   heavy bleeding  . ABDOMINAL HYSTERECTOMY    . BRONCHIAL BRUSHINGS  08/13/2019   Procedure: BRONCHIAL BRUSHINGS;  Surgeon: Garner Nash, DO;  Location: [place] [facility];  Service: Pulmonary;;  . BRONCHIAL WASHINGS  08/13/2019   Procedure: BRONCHIAL WASHINGS;  Surgeon: Garner Nash, DO;  Location: [facility];  Service: Pulmonary;;  . COLONOSCOPY W/ BIOPSIES AND POLYPECTOMY    . DILATION AND CURETTAGE OF UTERUS    . FINE NEEDLE ASPIRATION  08/13/2019   Procedure: FINE NEEDLE ASPIRATION (FNA) LINEAR;  Surgeon: Garner Nash, DO;  Location: [facility];  Service: Pulmonary;;  . FOOT SURGERY     bilateral bunions and hammer toes  . LUNG BIOPSY  08/13/2019   Procedure: LUNG BIOPSY;  Surgeon: Garner Nash, DO;  Location: [facility];  Service: Pulmonary;;  . MULTIPLE TOOTH EXTRACTIONS    . VIDEO BRONCHOSCOPY WITH ENDOBRONCHIAL NAVIGATION Right 08/13/2019   Procedure: VIDEO BRONCHOSCOPY WITH ENDOBRONCHIAL NAVIGATION;  Surgeon: Garner Nash, DO;  Location: [place] [facility];  Service: Pulmonary;  Laterality: Right;  Marland Kitchen VIDEO BRONCHOSCOPY WITH ENDOBRONCHIAL ULTRASOUND Right 08/13/2019   Procedure: VIDEO BRONCHOSCOPY WITH ENDOBRONCHIAL ULTRASOUND;  Surgeon: Garner Nash, DO;  Location: [facility];  Service: Pulmonary;  Laterality: Right;    Family History  Problem Relation Age of Onset  . Heart disease Mother 43       bypass at age   . Hypertension Mother   . Cancer Maternal Aunt        breast  . Cancer Paternal Aunt   . Diabetes Neg Hx   . Stroke Neg Hx     Social History Social History   Tobacco Use  . Smoking status: Former Smoker    Packs/day: 0.30    Years: 50.00    Pack years: 15.00    Types: Cigarettes    Quit date: 2016    Years since quitting: 5.8  . Smokeless tobacco: Never Used  Vaping Use  . Vaping Use: Never used  Substance Use Topics  . Alcohol use: Not Currently    Comment: occasional  . Drug use: Yes    Types:  Oxycodone    Allergies  Allergen Reactions  . Crab [Shellfish Allergy] Swelling    Swelling of feet  . Morphine And Related     Bad dreams    Current Outpatient Medications  Medication Sig Dispense Refill  . acetaminophen (TYLENOL) 500 MG tablet Take 500 mg by mouth every 6 (six) hours as needed for moderate pain or headache.     Marland Kitchen amLODipine (NORVASC) 5 MG tablet Take 1 tablet (5 mg total) by mouth every evening. 90 tablet 0  . aspirin 325 MG tablet Take 325 mg by mouth as needed.     . calcium carbonate (OSCAL) 1500 (600 Ca) MG TABS tablet Take 600 mg of elemental calcium by mouth 3 (three) times a week.    . gabapentin (NEURONTIN) 300 MG capsule Take 1 capsule by mouth as needed.  Every 8 hours    . lisinopril (PRINIVIL,ZESTRIL) 10 MG tablet Take 20 mg by mouth in the morning.    . Multiple Vitamins-Minerals (MULTIVITAMIN WITH MINERALS) tablet Take 1 tablet by mouth daily.    Marland Kitchen nystatin-triamcinolone ointment (MYCOLOG) Apply 1 application topically in the morning and at bedtime.    Marland Kitchen oxybutynin (DITROPAN) 5 MG tablet Take 5 mg by mouth as needed for bladder spasms.    . tizanidine (ZANAFLEX) 2 MG capsule Take 2 mg by mouth 3 (three) times daily.    . traMADol (ULTRAM) 50 MG tablet Take 1 tablet by mouth as needed. Every 8 hours     No current facility-administered medications for this visit.    Review of Systems  Constitutional: positive for fatigue Eyes: negative Ears, nose, mouth, throat, and face: negative Respiratory: positive for dyspnea on exertion Cardiovascular: negative Gastrointestinal: negative Genitourinary:negative Integument/breast: negative Hematologic/lymphatic: negative Musculoskeletal:negative Neurological: negative Behavioral/Psych: negative Endocrine: negative Allergic/Immunologic: negative  Physical Exam  SAY:TKZSW, healthy, no distress, well nourished, well developed and anxious SKIN: skin color, texture, turgor are normal, no rashes or  significant lesions HEAD: Normocephalic, No masses, lesions, tenderness or abnormalities EYES: normal, PERRLA, Conjunctiva are pink and non-injected EARS: External ears normal, Canals clear OROPHARYNX:no exudate, no erythema and lips, buccal mucosa, and tongue normal  NECK: supple, no adenopathy, no JVD LYMPH:  no palpable lymphadenopathy, no hepatosplenomegaly BREAST:not examined LUNGS: clear to auscultation , and palpation HEART: regular rate & rhythm, no murmurs and no gallops ABDOMEN:abdomen soft, non-tender, normal bowel sounds and no masses or organomegaly BACK: Back symmetric, no curvature., No CVA tenderness EXTREMITIES:no joint deformities, effusion, or inflammation, no edema  NEURO: alert & oriented x 3 with fluent speech, no focal motor/sensory deficits  PERFORMANCE STATUS: ECOG 1  LABORATORY DATA: Lab Results  Component Value Date   WBC 8.4 02/28/2020   HGB 12.9 02/28/2020   HCT 42.7 02/28/2020   MCV 89.5 02/28/2020   PLT 193 02/28/2020      Chemistry      Component Value Date/Time   NA 140 02/28/2020 1401   NA 142 07/22/2019 1522   K 4.4 02/28/2020 1401   CL 106 02/28/2020 1401   CO2 27 02/28/2020 1401   BUN 10 02/28/2020 1401   BUN 13 07/22/2019 1522   CREATININE 0.96 02/28/2020 1401   CREATININE 0.91 09/16/2011 1000      Component Value Date/Time   CALCIUM 9.4 02/28/2020 1401   ALKPHOS 70 02/28/2020 1401   AST 15 02/28/2020 1401   ALT 8 02/28/2020 1401   BILITOT 0.7 02/28/2020 1401       RADIOGRAPHIC STUDIES: CT Chest Wo Contrast  Result Date: 02/25/2020 CLINICAL DATA:  Restaging non-small cell lung cancer. EXAM: CT CHEST WITHOUT CONTRAST TECHNIQUE: Multidetector CT imaging of the chest was performed following the standard protocol without IV contrast. COMPARISON:  Chest CT 07/20/2019 and PET-CT 08/12/2019 FINDINGS: Cardiovascular: The heart is normal in size. No pericardial effusion. Stable tortuosity, ectasia and calcification of the thoracic  aorta. Fusiform aneurysmal dilatation of the ascending thoracic aorta with maximum measurement of 3.8 cm. Stable calcifications around the aortic valve and stable three-vessel coronary artery calcifications. Mediastinum/Nodes: Slight interval enlargement precarinal lymph nodes. 10 mm node on image 57/2 previously measured 6 mm. The adjacent node on image 53 measures 10 mm and previously measured 9 mm. No obvious hilar adenopathy on this noncontrast examination. The esophagus is grossly normal. Lungs/Pleura: Surgical changes from a right upper lobe wedge resection. Right paramediastinal subpleural  atelectasis and mild bronchiectasis but I do not see any findings suspicious for recurrent tumor. This is a baseline postsurgical examination. Stable underlying emphysematous changes. No new pulmonary nodules to suggest pulmonary metastatic disease. Very small right pleural effusion. Upper Abdomen: No significant upper abdominal findings. Scattered small hepatic cysts are again noted. No adrenal gland lesions. Stable aortic and branch vessel calcifications. Musculoskeletal: No breast masses, supraclavicular or axillary adenopathy. Small scattered axillary nodes are noted bilaterally. No significant bony findings. IMPRESSION: 1. Surgical changes from a right upper lobe wedge resection. No findings suspicious for recurrent tumor. 2. Right paramediastinal subpleural atelectasis and mild bronchiectasis. 3. Slight interval enlargement of precarinal lymph nodes. Recommend continued surveillance. 4. Stable emphysematous changes. 5. 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 6. Stable three-vessel coronary artery calcifications. 7. Emphysema and aortic atherosclerosis. Aortic Atherosclerosis (ICD10-I70.0) and Emphysema (ICD10-J43.9). Electronically Signed   By: Marijo Sanes M.D.   On: 02/25/2020 10:10    ASSESSMENT: This is a very pleasant 69 years old  African-American female recently diagnosed with a stage IIb (T1b, N1, M0) non-small cell lung cancer, adenocarcinoma presented with right upper lobe lung nodule status post right upper lobectomy with lymph node dissection on September 30, 2019 in [place].   PLAN: I had a lengthy discussion with the patient today about her current condition and treatment options. Unfortunately the patient declined systemic chemotherapy that was offered to her after her surgical resection in [place].  She already passed the 12 weeks that would have been the reasonable time for her to receive adjuvant systemic chemotherapy. I recommended for the patient to continue on observation for now with repeat CT scan of the chest in around 5 months for restaging of her disease.  Her most recent CT scan of the chest was negative for disease recurrence. If the patient has any evidence for recurrence and will discuss with her other treatment options but for now adjuvant systemic chemotherapy is not an option because of the significant lag of time after her surgical resection. The patient was advised to call immediately if she has any other concerning symptoms in the interval. The patient voices understanding of current disease status and treatment options and is in agreement with the current care plan.  All questions were answered. The patient knows to call the clinic with any problems, questions or concerns. We can certainly see the patient much sooner if necessary.  Thank you so much for allowing me to participate in the care of [facility]. I will continue to follow up the patient with you and assist in her care.  The total time spent in the appointment was 70 minutes.  Disclaimer: This note was dictated with voice recognition software. Similar sounding words can inadvertently be transcribed and may not be corrected upon review.   Brandi Holmes February 28, 2020, 2:44 PM

## 2020-03-02 ENCOUNTER — Telehealth: Payer: Self-pay | Admitting: Internal Medicine

## 2020-03-02 NOTE — Telephone Encounter (Signed)
Scheduled per los. Called and left msg. Mailed printout  °

## 2020-06-12 ENCOUNTER — Other Ambulatory Visit: Payer: Self-pay | Admitting: Family Medicine

## 2020-06-12 ENCOUNTER — Ambulatory Visit
Admission: RE | Admit: 2020-06-12 | Discharge: 2020-06-12 | Disposition: A | Discharge status: Home / Self Care | Payer: Medicare HMO | Source: Ambulatory Visit | Attending: Family Medicine | Admitting: Family Medicine

## 2020-06-12 DIAGNOSIS — Z8781 Personal history of (healed) traumatic fracture: Secondary | ICD-10-CM

## 2020-07-04 ENCOUNTER — Encounter: Payer: Medicare HMO | Admitting: Thoracic Surgery (Cardiothoracic Vascular Surgery)

## 2020-07-18 ENCOUNTER — Telehealth: Payer: Self-pay | Admitting: Internal Medicine

## 2020-07-18 NOTE — Telephone Encounter (Signed)
Rescheduled upcoming appointments per 4/5 schedule message. Patient is aware of changes.

## 2020-07-28 ENCOUNTER — Inpatient Hospital Stay: Payer: Medicare HMO

## 2020-07-31 ENCOUNTER — Inpatient Hospital Stay: Payer: Medicare HMO | Admitting: Internal Medicine

## 2020-08-10 ENCOUNTER — Inpatient Hospital Stay: Payer: Medicare HMO | Attending: Internal Medicine

## 2020-08-10 ENCOUNTER — Other Ambulatory Visit: Payer: Self-pay

## 2020-08-10 ENCOUNTER — Ambulatory Visit (HOSPITAL_COMMUNITY)
Admission: RE | Admit: 2020-08-10 | Discharge: 2020-08-10 | Disposition: A | Discharge status: Home / Self Care | Payer: Medicare HMO | Source: Ambulatory Visit | Attending: Internal Medicine | Admitting: Internal Medicine

## 2020-08-10 DIAGNOSIS — C3491 Malignant neoplasm of unspecified part of right bronchus or lung: Secondary | ICD-10-CM | POA: Insufficient documentation

## 2020-08-10 DIAGNOSIS — C3490 Malignant neoplasm of unspecified part of unspecified bronchus or lung: Secondary | ICD-10-CM

## 2020-08-10 DIAGNOSIS — Z902 Acquired absence of lung [part of]: Secondary | ICD-10-CM | POA: Insufficient documentation

## 2020-08-10 LAB — CMP (CANCER CENTER ONLY)
ALT: 11 U/L (ref 0–44)
AST: 17 U/L (ref 15–41)
Albumin: 4 g/dL (ref 3.5–5.0)
Alkaline Phosphatase: 71 U/L (ref 38–126)
Anion gap: 9 (ref 5–15)
BUN: 21 mg/dL (ref 8–23)
CO2: 25 mmol/L (ref 22–32)
Calcium: 9.5 mg/dL (ref 8.9–10.3)
Chloride: 106 mmol/L (ref 98–111)
Creatinine: 0.97 mg/dL (ref 0.44–1.00)
GFR, Estimated: >60 mL/min (ref >60)
Glucose, Bld: 90 mg/dL (ref 70–99)
Potassium: 4.4 mmol/L (ref 3.5–5.1)
Sodium: 140 mmol/L (ref 135–145)
Total Bilirubin: 0.5 mg/dL (ref 0.3–1.2)
Total Protein: 7.9 g/dL (ref 6.5–8.1)

## 2020-08-10 LAB — CBC WITH DIFFERENTIAL (CANCER CENTER ONLY)
Abs Immature Granulocytes: 0.01 10*3/uL (ref 0.00–0.07)
Basophils Absolute: 0 10*3/uL (ref 0.0–0.1)
Basophils Relative: 1 %
Eosinophils Absolute: 0.3 10*3/uL (ref 0.0–0.5)
Eosinophils Relative: 3 %
HCT: 43.2 % (ref 36.0–46.0)
Hemoglobin: 13.7 g/dL (ref 12.0–15.0)
Immature Granulocytes: 0 %
Lymphocytes Relative: 33 %
Lymphs Abs: 2.8 10*3/uL (ref 0.7–4.0)
MCH: 28.5 pg (ref 26.0–34.0)
MCHC: 31.7 g/dL (ref 30.0–36.0)
MCV: 90 fL (ref 80.0–100.0)
Monocytes Absolute: 0.6 10*3/uL (ref 0.1–1.0)
Monocytes Relative: 7 %
Neutro Abs: 4.9 10*3/uL (ref 1.7–7.7)
Neutrophils Relative %: 56 %
Platelet Count: 323 10*3/uL (ref 150–400)
RBC: 4.8 MIL/uL (ref 3.87–5.11)
RDW: 12.4 % (ref 11.5–15.5)
WBC Count: 8.6 10*3/uL (ref 4.0–10.5)
nRBC: 0 % (ref 0.0–0.2)

## 2020-08-10 MED ORDER — IOHEXOL 300 MG/ML  SOLN
75.0000 mL | Freq: Once | INTRAMUSCULAR | Status: AC | PRN
  Administered 2020-08-10: 75 mL via INTRAVENOUS

## 2020-08-11 ENCOUNTER — Telehealth: Payer: Self-pay | Admitting: Pulmonary Disease

## 2020-08-11 NOTE — Telephone Encounter (Signed)
Ok thanks Garner Nash, DO [facility] 08/11/2020 4:28 PM

## 2020-08-11 NOTE — Telephone Encounter (Signed)
pt is calling because hosptial in [place] sent the wrong  disk of surgery and  ct scans.. pt states that the copy that has her name  printed on it is her CT scans  but the hand written copy is not her .  pt believes Dr. Valeta Harms might've received his copy around 07/30/20.. states the right one was sent & has pt name hand written on it.  pt recieved their copy today so Dr. Valeta Harms should receive the right one soon.. please advise once confirmed that it has arrived  - [phone]

## 2020-08-11 NOTE — Telephone Encounter (Signed)
Called and spoke with patient. She stated that the hospital in [place] has accidentally sent a disk with her information combined with another patient's information. She discovered this because she had requested a copy of the information as well and she viewed the CD at home and discovered the mistake. They have issued a new disk.   I found the new disk up front in Icard's inbox. I have placed it  in his cubby in [place] for review.

## 2020-08-15 ENCOUNTER — Encounter: Payer: Self-pay | Admitting: Internal Medicine

## 2020-08-15 ENCOUNTER — Inpatient Hospital Stay: Payer: Medicare HMO | Attending: Internal Medicine | Admitting: Internal Medicine

## 2020-08-15 ENCOUNTER — Other Ambulatory Visit: Payer: Self-pay

## 2020-08-15 ENCOUNTER — Encounter: Payer: Self-pay | Admitting: *Deleted

## 2020-08-15 DIAGNOSIS — Z79899 Other long term (current) drug therapy: Secondary | ICD-10-CM | POA: Diagnosis not present

## 2020-08-15 DIAGNOSIS — C3490 Malignant neoplasm of unspecified part of unspecified bronchus or lung: Secondary | ICD-10-CM | POA: Diagnosis not present

## 2020-08-15 DIAGNOSIS — I2510 Atherosclerotic heart disease of native coronary artery without angina pectoris: Secondary | ICD-10-CM | POA: Insufficient documentation

## 2020-08-15 DIAGNOSIS — J984 Other disorders of lung: Secondary | ICD-10-CM | POA: Diagnosis not present

## 2020-08-15 DIAGNOSIS — R079 Chest pain, unspecified: Secondary | ICD-10-CM | POA: Insufficient documentation

## 2020-08-15 DIAGNOSIS — I700 Atherosclerosis of aorta: Secondary | ICD-10-CM | POA: Insufficient documentation

## 2020-08-15 DIAGNOSIS — C3411 Malignant neoplasm of upper lobe, right bronchus or lung: Secondary | ICD-10-CM | POA: Insufficient documentation

## 2020-08-15 DIAGNOSIS — Z885 Allergy status to narcotic agent status: Secondary | ICD-10-CM | POA: Diagnosis not present

## 2020-08-15 NOTE — Progress Notes (Signed)
[place] Telephone:([phone]   Fax:(336) [place], [place], [place] [address]  DIAGNOSIS: Stage IIb (T1b, N1, M0) non-small cell lung cancer, adenocarcinoma presented with right upper lobe lung nodule  PRIOR THERAPY:  Status post right upper lobectomy with lymph node dissection on September 30, 2019 in [place].  She declines adjuvant systemic chemotherapy.  CURRENT THERAPY: Observation.  INTERVAL HISTORY: Brandi Holmes 70 y.o. female returns to the clinic today for follow-up visit.  The patient is feeling fine today with no concerning complaints except for the intermittent pain on the right side of the chest from the previous surgical scar.  She denied having any shortness of breath, cough or hemoptysis.  She denied having any fever or chills.  She has no nausea, vomiting, diarrhea or constipation.  She denied having any headache or visual changes.  She is here today for evaluation with repeat CT scan of the chest for restaging of her disease.  MEDICAL HISTORY: Past Medical History:  Diagnosis Date  . Anemia    as a child only  . Hypertension   . Liver spots    " per pt"  . Lung nodule   . Wears glasses   . Wears partial dentures     ALLERGIES:  is allergic to crab extract allergy skin test, crab [shellfish allergy], and morphine and related.  MEDICATIONS:  Current Outpatient Medications  Medication Sig Dispense Refill  . acetaminophen (TYLENOL) 500 MG tablet Take 500 mg by mouth every 6 (six) hours as needed for moderate pain or headache.     Marland Kitchen amLODipine (NORVASC) 5 MG tablet Take 1 tablet (5 mg total) by mouth every evening. 90 tablet 0  . aspirin 325 MG tablet Take 325 mg by mouth as needed.     . calcium carbonate (OSCAL) 1500 (600 Ca) MG TABS tablet Take 600 mg of elemental calcium by mouth 3 (three) times a week.    . gabapentin (NEURONTIN) 300 MG capsule Take 1 capsule by mouth as  needed. Every 8 hours    . lisinopril (PRINIVIL,ZESTRIL) 10 MG tablet Take 20 mg by mouth in the morning.    . Multiple Vitamins-Minerals (MULTIVITAMIN WITH MINERALS) tablet Take 1 tablet by mouth daily.    Marland Kitchen nystatin-triamcinolone ointment (MYCOLOG) Apply 1 application topically in the morning and at bedtime.    Marland Kitchen oxybutynin (DITROPAN) 5 MG tablet Take 5 mg by mouth as needed for bladder spasms.    . tizanidine (ZANAFLEX) 2 MG capsule Take 2 mg by mouth at bedtime.     . traMADol (ULTRAM) 50 MG tablet Take 1 tablet by mouth as needed. Every 8 hours     No current facility-administered medications for this visit.    SURGICAL HISTORY:  Past Surgical History:  Procedure Laterality Date  . ABDOMINAL HYSTERECTOMY  1974   heavy bleeding  . ABDOMINAL HYSTERECTOMY    . BRONCHIAL BRUSHINGS  08/13/2019   Procedure: BRONCHIAL BRUSHINGS;  Surgeon: Garner Nash, DO;  Location: [facility];  Service: Pulmonary;;  . BRONCHIAL WASHINGS  08/13/2019   Procedure: BRONCHIAL WASHINGS;  Surgeon: Garner Nash, DO;  Location: [facility];  Service: Pulmonary;;  . COLONOSCOPY W/ BIOPSIES AND POLYPECTOMY    . DILATION AND CURETTAGE OF UTERUS    . FINE NEEDLE ASPIRATION  08/13/2019   Procedure: FINE NEEDLE ASPIRATION (FNA) LINEAR;  Surgeon: Garner Nash, DO;  Location: [facility];  Service: Pulmonary;;  . FOOT SURGERY     bilateral bunions and hammer toes  . LUNG BIOPSY  08/13/2019   Procedure: LUNG BIOPSY;  Surgeon: Garner Nash, DO;  Location: [facility];  Service: Pulmonary;;  . MULTIPLE TOOTH EXTRACTIONS    . VIDEO BRONCHOSCOPY WITH ENDOBRONCHIAL NAVIGATION Right 08/13/2019   Procedure: VIDEO BRONCHOSCOPY WITH ENDOBRONCHIAL NAVIGATION;  Surgeon: Garner Nash, DO;  Location: [facility];  Service: Pulmonary;  Laterality: Right;  Marland Kitchen VIDEO BRONCHOSCOPY WITH ENDOBRONCHIAL ULTRASOUND Right 08/13/2019   Procedure: VIDEO BRONCHOSCOPY WITH ENDOBRONCHIAL ULTRASOUND;  Surgeon: Garner Nash,  DO;  Location: [facility];  Service: Pulmonary;  Laterality: Right;    REVIEW OF SYSTEMS:  A comprehensive review of systems was negative except for: Respiratory: positive for pleurisy/chest pain   PHYSICAL EXAMINATION: General appearance: alert, cooperative and no distress Head: Normocephalic, without obvious abnormality, atraumatic Neck: no adenopathy, no JVD, supple, symmetrical, trachea midline and thyroid not enlarged, symmetric, no tenderness/mass/nodules Lymph nodes: Cervical, supraclavicular, and axillary nodes normal. Resp: clear to auscultation bilaterally Back: symmetric, no curvature. ROM normal. No CVA tenderness. Cardio: regular rate and rhythm, S1, S2 normal, no murmur, click, rub or gallop GI: soft, non-tender; bowel sounds normal; no masses,  no organomegaly Extremities: extremities normal, atraumatic, no cyanosis or edema  ECOG PERFORMANCE STATUS: 1 - Symptomatic but completely ambulatory  Blood pressure (!) 144/60, pulse 65, temperature 98.5 F (36.9 C), temperature source Tympanic, resp. rate 20, height 5\' 6"  (1.676 m), weight 179 lb 6.4 oz (81.4 kg), SpO2 100 %.  LABORATORY DATA: Lab Results  Component Value Date   WBC 8.6 08/10/2020   HGB 13.7 08/10/2020   HCT 43.2 08/10/2020   MCV 90.0 08/10/2020   PLT 323 08/10/2020      Chemistry      Component Value Date/Time   NA 140 08/10/2020 1045   NA 142 07/22/2019 1522   K 4.4 08/10/2020 1045   CL 106 08/10/2020 1045   CO2 25 08/10/2020 1045   BUN 21 08/10/2020 1045   BUN 13 07/22/2019 1522   CREATININE 0.97 08/10/2020 1045   CREATININE 0.91 09/16/2011 1000      Component Value Date/Time   CALCIUM 9.5 08/10/2020 1045   ALKPHOS 71 08/10/2020 1045   AST 17 08/10/2020 1045   ALT 11 08/10/2020 1045   BILITOT 0.5 08/10/2020 1045       RADIOGRAPHIC STUDIES: CT Chest W Contrast  Result Date: 08/11/2020 CLINICAL DATA:  RIGHT lung cancer diagnosed in April of 2021. EXAM: CT CHEST WITH CONTRAST  TECHNIQUE: Multidetector CT imaging of the chest was performed during intravenous contrast administration. CONTRAST:  65mL OMNIPAQUE IOHEXOL 300 MG/ML  SOLN COMPARISON:  February 24, 2020 FINDINGS: Cardiovascular: Calcified and noncalcified atheromatous plaque of the thoracic aorta. No aneurysmal dilation. Normal heart size without pericardial effusion. Three-vessel coronary artery disease. Central pulmonary vasculature remarkable for distortion at the RIGHT hilum secondary to partial lung resection. Undo that Mediastinum/Nodes: 1 cm precarinal lymph node unchanged image 59/2 Mild fullness of tissue about the RIGHT hilum grossly stable, not as well seen on previous studies. Largest lymph node image 68/2, 6 mm. Paramediastinal post treatment changes. No axillary lymphadenopathy. No thoracic inlet lymphadenopathy. Esophagus mildly patulous otherwise unremarkable. Lungs/Pleura: Pleural and parenchymal scarring along resection changes tracking along the RIGHT mediastinal border. Mild septal thickening at the RIGHT lung base. No new or suspicious pulmonary nodule. Upper Abdomen: Scattered low-density foci in the liver unchanged, dominant area 14 mm in the  posterior RIGHT hepatic lobe, compatible with cysts in the liver. Liver incompletely imaged. No acute upper abdominal process to the extent evaluated. Musculoskeletal: Spinal degenerative changes. Rib defects and healed fractures related to prior thoracotomy in the posterior RIGHT chest with similar appearance. IMPRESSION: 1. Stable post treatment changes about the RIGHT hilum following RIGHT upper lobectomy. 2. No findings to suggest recurrent or metastatic disease with stable top-normal size of lymph nodes in the mediastinum. 3. Three-vessel coronary artery disease. 4. Aortic atherosclerosis. Electronically Signed   By: Zetta Bills M.D.   On: 08/11/2020 08:06    ASSESSMENT AND PLAN: This is a pleasant 70 years old African-American female diagnosed with a stage  IIb (T1b, N1, M0) non-small cell lung cancer, adenocarcinoma status post a right upper lobectomy with lymph node dissection on September 30, 2019 in [place].  She declined adjuvant systemic chemotherapy at that time. The patient is currently on observation and she is feeling fine today with no concerning complaints except for the persistent pain on the right side of the chest from the surgical scar. She had repeat CT scan of the chest performed recently.  I personally and independently reviewed the scans and discussed the results with the patient today. Her scan showed no concerning findings for disease recurrence or metastasis. I recommended for her to continue on observation with repeat CT scan of the chest in 6 months. The patient was advised to call immediately if she has any concerning symptoms in the interval. The patient voices understanding of current disease status and treatment options and is in agreement with the current care plan.  All questions were answered. The patient knows to call the clinic with any problems, questions or concerns. We can certainly see the patient much sooner if necessary.  The total time spent in the appointment was 20 minutes.  Disclaimer: This note was dictated with voice recognition software. Similar sounding words can inadvertently be transcribed and may not be corrected upon review.

## 2020-08-15 NOTE — Progress Notes (Signed)
Per Dr. Julien Nordmann, I took Brandi Holmes's CD to radiology to upload into PACS system.

## 2020-08-16 ENCOUNTER — Ambulatory Visit
Admission: RE | Admit: 2020-08-16 | Discharge: 2020-08-16 | Disposition: A | Discharge status: Home / Self Care | Payer: Self-pay | Source: Ambulatory Visit | Attending: Internal Medicine | Admitting: Internal Medicine

## 2020-08-16 ENCOUNTER — Inpatient Hospital Stay
Admission: RE | Admit: 2020-08-16 | Discharge: 2020-08-16 | Disposition: A | Discharge status: Home / Self Care | Payer: Self-pay | Source: Ambulatory Visit | Attending: Internal Medicine | Admitting: Internal Medicine

## 2020-08-16 ENCOUNTER — Other Ambulatory Visit (HOSPITAL_COMMUNITY): Payer: Self-pay | Admitting: Internal Medicine

## 2020-08-16 DIAGNOSIS — C801 Malignant (primary) neoplasm, unspecified: Secondary | ICD-10-CM

## 2020-08-22 ENCOUNTER — Encounter: Payer: Self-pay | Admitting: Thoracic Surgery (Cardiothoracic Vascular Surgery)

## 2020-08-22 ENCOUNTER — Institutional Professional Consult (permissible substitution) (INDEPENDENT_AMBULATORY_CARE_PROVIDER_SITE_OTHER): Payer: Medicare HMO | Admitting: Thoracic Surgery (Cardiothoracic Vascular Surgery)

## 2020-08-22 ENCOUNTER — Ambulatory Visit: Payer: Medicare HMO | Admitting: Cardiology

## 2020-08-22 ENCOUNTER — Encounter: Payer: Self-pay | Admitting: Cardiology

## 2020-08-22 ENCOUNTER — Other Ambulatory Visit: Payer: Self-pay

## 2020-08-22 VITALS — BP 143/76 | HR 62 | Temp 97.9°F | Resp 16 | Ht 66.0 in | Wt 178.2 lb

## 2020-08-22 DIAGNOSIS — R0781 Pleurodynia: Secondary | ICD-10-CM | POA: Diagnosis not present

## 2020-08-22 DIAGNOSIS — Z85118 Personal history of other malignant neoplasm of bronchus and lung: Secondary | ICD-10-CM

## 2020-08-22 DIAGNOSIS — I2584 Coronary atherosclerosis due to calcified coronary lesion: Secondary | ICD-10-CM | POA: Insufficient documentation

## 2020-08-22 DIAGNOSIS — Z87891 Personal history of nicotine dependence: Secondary | ICD-10-CM

## 2020-08-22 DIAGNOSIS — I2510 Atherosclerotic heart disease of native coronary artery without angina pectoris: Secondary | ICD-10-CM | POA: Insufficient documentation

## 2020-08-22 DIAGNOSIS — I10 Essential (primary) hypertension: Secondary | ICD-10-CM

## 2020-08-22 MED ORDER — AMLODIPINE BESYLATE 10 MG PO TABS: 10.0000 mg | ORAL_TABLET | Freq: Every day | ORAL | 0 refills | Status: DC

## 2020-08-22 MED ORDER — ATORVASTATIN CALCIUM 20 MG PO TABS: 20.0000 mg | ORAL_TABLET | Freq: Every day | ORAL | 0 refills | Status: DC

## 2020-08-22 NOTE — Progress Notes (Signed)
Brandi Holmes Date of Birth: [birth year] MRN: [id number] Primary Care Provider:Reese, Zadie Cleverly, MD Primary Cardiologist: Rex Kras, DO (established care 07/15/2019)  Date: 08/22/20 Last Office Visit: 02/22/2020  Chief Complaint  Patient presents with  . Hypertension  . Follow-up    Coronary calcification    HPI  Brandi Holmes is a 70 y.o. female who presents to the office with a chief complaint of " blood pressure management and coronary artery calcification." Patient's past medical history and cardiac risk factors include: Hypertension, former smoker, postmenopausal female, advanced age, obesity.  Patient was referred to the office for management of blood pressure at the request of her PCP.  Hypertension: Patient states that she was diagnosed with high blood pressure approximately 15 years ago.  Since last office visit patient states that her home blood pressures are ranging between 130-140 mmHg.  She is currently on pharmacological therapy.  Medications reconciled.  She tries to follow a low-salt diet.  Since last office visit she had a CT of the chest which noted multivessel coronary artery calcification.  She denies any chest pain at rest or with effort related activities.  She was currently taking aspirin 325 mg p.o. daily.  Is currently not on statin therapy.  Last lipid profile was checked during her stay at [facility] last year results reviewed from care everywhere and noted below for further reference.    History of right-sided lung cancer and underwent lobectomy with lymph node excision and surgery took place at [facility] in [place].  Now she follows up with her pulmonologist in town.    No premature coronary artery disease in the family.    FUNCTIONAL STATUS: No structured exercise program or daily routine.    ALLERGIES: Allergies  Allergen Reactions  . Crab Extract Allergy Skin Test Swelling    Swelling of feet  . Crab  [Shellfish Allergy] Swelling    Swelling of feet  . Morphine And Related     Bad dreams     MEDICATION LIST PRIOR TO VISIT: Current Outpatient Medications on File Prior to Visit  Medication Sig Dispense Refill  . acetaminophen (TYLENOL) 500 MG tablet Take 500 mg by mouth every 6 (six) hours as needed for moderate pain or headache.     Marland Kitchen aspirin EC 81 MG tablet Take 81 mg by mouth daily. Swallow whole.    . calcium carbonate (OSCAL) 1500 (600 Ca) MG TABS tablet Take 600 mg of elemental calcium by mouth 3 (three) times a week.    . gabapentin (NEURONTIN) 300 MG capsule Take 1 capsule by mouth as needed. Every 8 hours    . lisinopril (ZESTRIL) 20 MG tablet Take 20 mg by mouth in the morning.    . Multiple Vitamins-Minerals (MULTIVITAMIN WITH MINERALS) tablet Take 1 tablet by mouth daily.    Marland Kitchen nystatin-triamcinolone ointment (MYCOLOG) Apply 1 application topically in the morning and at bedtime.    Marland Kitchen oxybutynin (DITROPAN) 5 MG tablet Take 5 mg by mouth as needed for bladder spasms.    . tizanidine (ZANAFLEX) 2 MG capsule Take 2 mg by mouth at bedtime.     . traMADol (ULTRAM) 50 MG tablet Take 1 tablet by mouth as needed. Every 8 hours     No current facility-administered medications on file prior to visit.    PAST MEDICAL HISTORY: Past Medical History:  Diagnosis Date  . Anemia    as a child only  . Coronary artery calcification of native artery   .  Hypertension   . Liver spots    " per pt"  . Lung nodule   . Wears glasses   . Wears partial dentures     PAST SURGICAL HISTORY: Past Surgical History:  Procedure Laterality Date  . ABDOMINAL HYSTERECTOMY  1974   heavy bleeding  . ABDOMINAL HYSTERECTOMY    . BRONCHIAL BRUSHINGS  08/13/2019   Procedure: BRONCHIAL BRUSHINGS;  Surgeon: Garner Nash, DO;  Location: [place] [facility];  Service: Pulmonary;;  . BRONCHIAL WASHINGS  08/13/2019   Procedure: BRONCHIAL WASHINGS;  Surgeon: Garner Nash, DO;  Location: [place] [facility];  Service:  Pulmonary;;  . COLONOSCOPY W/ BIOPSIES AND POLYPECTOMY    . DILATION AND CURETTAGE OF UTERUS    . FINE NEEDLE ASPIRATION  08/13/2019   Procedure: FINE NEEDLE ASPIRATION (FNA) LINEAR;  Surgeon: Garner Nash, DO;  Location: [place] [facility];  Service: Pulmonary;;  . FOOT SURGERY     bilateral bunions and hammer toes  . LUNG BIOPSY  08/13/2019   Procedure: LUNG BIOPSY;  Surgeon: Garner Nash, DO;  Location: [place] [facility];  Service: Pulmonary;;  . MULTIPLE TOOTH EXTRACTIONS    . VIDEO BRONCHOSCOPY WITH ENDOBRONCHIAL NAVIGATION Right 08/13/2019   Procedure: VIDEO BRONCHOSCOPY WITH ENDOBRONCHIAL NAVIGATION;  Surgeon: Garner Nash, DO;  Location: [place];  Service: Pulmonary;  Laterality: Right;  Marland Kitchen VIDEO BRONCHOSCOPY WITH ENDOBRONCHIAL ULTRASOUND Right 08/13/2019   Procedure: VIDEO BRONCHOSCOPY WITH ENDOBRONCHIAL ULTRASOUND;  Surgeon: Garner Nash, DO;  Location: [place];  Service: Pulmonary;  Laterality: Right;    FAMILY HISTORY: The patient family history includes Cancer in her maternal aunt and paternal aunt; Heart disease (age of onset: 40) in her mother; Hypertension in her mother.   SOCIAL HISTORY:  The patient  reports that she quit smoking about 6 years ago. Her smoking use included cigarettes. She has a 15.00 pack-year smoking history. She has never used smokeless tobacco. She reports previous alcohol use. She reports current drug use. Drug: Oxycodone.  Review of Systems  Constitutional: Negative for chills and fever.  HENT: Negative for hoarse voice and nosebleeds.   Eyes: Negative for discharge, double vision and pain.  Cardiovascular: Negative for chest pain, claudication, dyspnea on exertion, leg swelling, near-syncope, orthopnea, palpitations, paroxysmal nocturnal dyspnea and syncope.  Respiratory: Negative for hemoptysis and shortness of breath.   Musculoskeletal: Negative for muscle cramps and myalgias.  Gastrointestinal: Negative for abdominal pain,  constipation, diarrhea, hematemesis, hematochezia, melena, nausea and vomiting.  Neurological: Negative for dizziness and light-headedness.   PHYSICAL EXAM: Vitals with BMI 08/22/2020 08/22/2020 08/15/2020  Height 5\' 6"  5\' 6"  5\' 6"   Weight 178 lbs 3 oz 177 lbs 10 oz 179 lbs 6 oz  BMI 28.78 10.62 69.48  Systolic 546 270 350  Diastolic 76 77 60  Pulse 62 72 65   CONSTITUTIONAL: Well-developed and well-nourished. No acute distress.  SKIN: Skin is warm and dry. No rash noted. No cyanosis. No pallor. No jaundice HEAD: Normocephalic and atraumatic.  EYES: No scleral icterus MOUTH/THROAT: Moist oral membranes.  NECK: No JVD present. No thyromegaly noted. No carotid bruits  LYMPHATIC: No visible cervical adenopathy.  CHEST Normal respiratory effort. No intercostal retractions  LUNGS: Clear to auscultation bilaterally.  No stridor. No wheezes. No rales.  CARDIOVASCULAR: Regular rate and rhythm, positive S1-S2, no murmurs rubs or gallops appreciated ABDOMINAL: No apparent ascites.  EXTREMITIES: No peripheral edema  HEMATOLOGIC: No significant bruising NEUROLOGIC: Oriented to person, place, and time. Nonfocal. Normal muscle tone.  PSYCHIATRIC: Normal mood  and affect. Normal behavior. Cooperative  RADIOLOGY: CT chest without contrast 02/24/2020: 1. Surgical changes from a right upper lobe wedge resection. No findings suspicious for recurrent tumor. 2. Right paramediastinal subpleural atelectasis and mild bronchiectasis. 3. Slight interval enlargement of precarinal lymph nodes. Recommend continued surveillance. 4. Stable emphysematous changes. 5. 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 6. Stable three-vessel coronary artery calcifications. 7. Emphysema and aortic atherosclerosis. Aortic Atherosclerosis (ICD10-I70.0) and Emphysema (ICD10-J43.9).  CARDIAC DATABASE: EKG: 08/22/2020: Sinus bradycardia, 55 bpm, normal axis, without  underlying injury pattern.  Echocardiogram: 07/27/2019: LVEF 67%, grade 1 diastolic impairment, mild LVH, mild TR, RVSP 26 mmHg.  Stress Testing:  None  Heart Catheterization: None  LABORATORY DATA: CBC Latest Ref Rng & Units 08/10/2020 02/28/2020 08/13/2019  WBC 4.0 - 10.5 K/uL 8.6 8.4 6.9  Hemoglobin 12.0 - 15.0 g/dL 13.7 12.9 12.7  Hematocrit 36.0 - 46.0 % 43.2 42.7 41.2  Platelets 150 - 400 K/uL 323 193 309    CMP Latest Ref Rng & Units 08/10/2020 02/28/2020 08/13/2019  Glucose 70 - 99 mg/dL 90 87 121(H)  BUN 8 - 23 mg/dL 21 10 9   Creatinine 0.44 - 1.00 mg/dL 0.97 0.96 1.03(H)  Sodium 135 - 145 mmol/L 140 140 141  Potassium 3.5 - 5.1 mmol/L 4.4 4.4 4.1  Chloride 98 - 111 mmol/L 106 106 110  CO2 22 - 32 mmol/L 25 27 24   Calcium 8.9 - 10.3 mg/dL 9.5 9.4 9.6  Total Protein 6.5 - 8.1 g/dL 7.9 7.4 6.9  Total Bilirubin 0.3 - 1.2 mg/dL 0.5 0.7 0.6  Alkaline Phos 38 - 126 U/L 71 70 43  AST 15 - 41 U/L 17 15 18   ALT 0 - 44 U/L 11 8 14     Lipid Panel     Component Value Date/Time   CHOL 165 09/16/2011 1000   TRIG 84 09/16/2011 1000   HDL 45 09/16/2011 1000   CHOLHDL 3.7 09/16/2011 1000   VLDL 17 09/16/2011 1000   LDLCALC 103 (H) 09/16/2011 1000    No results found for: HGBA1C No components found for: NTPROBNP No results found for: TSH   Lipid profile: Collected: 10/02/2019 at [facility] Total cholesterol 135, triglycerides 178, HDL 41, LDL 70.   FINAL MEDICATION LIST END OF ENCOUNTER: Meds ordered this encounter  Medications  . amLODipine (NORVASC) 10 MG tablet    Sig: Take 1 tablet (10 mg total) by mouth daily.    Dispense:  90 tablet    Refill:  0  . atorvastatin (LIPITOR) 20 MG tablet    Sig: Take 1 tablet (20 mg total) by mouth at bedtime.    Dispense:  90 tablet    Refill:  0    Medications Discontinued During This Encounter  Medication Reason  . aspirin 325 MG tablet Change in therapy  . amLODipine (NORVASC) 5 MG tablet Change in therapy      Current Outpatient Medications:  .  acetaminophen (TYLENOL) 500 MG tablet, Take 500 mg by mouth every 6 (six) hours as needed for moderate pain or headache. , Disp: , Rfl:  .  amLODipine (NORVASC) 10 MG tablet, Take 1 tablet (10 mg total) by mouth daily., Disp: 90 tablet, Rfl: 0 .  aspirin EC 81 MG tablet, Take 81 mg by mouth daily. Swallow whole., Disp: , Rfl:  .  atorvastatin (LIPITOR) 20 MG tablet, Take 1 tablet (20 mg total) by mouth at bedtime., Disp: 90 tablet, Rfl: 0 .  calcium carbonate (OSCAL) 1500 (600 Ca) MG TABS tablet, Take 600 mg of elemental calcium by mouth 3 (three) times a week., Disp: , Rfl:  .  gabapentin (NEURONTIN) 300 MG capsule, Take 1 capsule by mouth as needed. Every 8 hours, Disp: , Rfl:  .  lisinopril (ZESTRIL) 20 MG tablet, Take 20 mg by mouth in the morning., Disp: , Rfl:  .  Multiple Vitamins-Minerals (MULTIVITAMIN WITH MINERALS) tablet, Take 1 tablet by mouth daily., Disp: , Rfl:  .  nystatin-triamcinolone ointment (MYCOLOG), Apply 1 application topically in the morning and at bedtime., Disp: , Rfl:  .  oxybutynin (DITROPAN) 5 MG tablet, Take 5 mg by mouth as needed for bladder spasms., Disp: , Rfl:  .  tizanidine (ZANAFLEX) 2 MG capsule, Take 2 mg by mouth at bedtime. , Disp: , Rfl:  .  traMADol (ULTRAM) 50 MG tablet, Take 1 tablet by mouth as needed. Every 8 hours, Disp: , Rfl:   IMPRESSION:    ICD-10-CM   1. Essential hypertension  I10 EKG 12-Lead    amLODipine (NORVASC) 10 MG tablet  2. Coronary artery calcification of native artery  I25.10 PCV MYOCARDIAL PERFUSION WO LEXISCAN   I25.84 atorvastatin (LIPITOR) 20 MG tablet    Lipid Panel With LDL/HDL Ratio    LDL cholesterol, direct    CANCELED: CT CARDIAC SCORING (SELF PAY ONLY)  3. Former smoker  Z87.891   4. Right sided lung cancer s/p lymph node excision and RUL lobectomy.   Z85.118      RECOMMENDATIONS: Brandi Holmes is a 70 y.o. female whose past medical history and cardiac risk  factors include: Hypertension, coronary artery calcification, former smoker, postmenopausal female, advanced age, obesity.  Benign essential hypertension:  Home blood pressures within acceptable range but not at goal.  Will increase her amlodipine to 10 mg p.o. every afternoon.  Patient is asked to keep a log of her blood pressures and to bring it in at the next office visit.  Low-salt diet recommended.  Educated on the importance of increasing physical activity as tolerated with a goal of 30 minutes a day 5 days a week.  Coronary artery calcification:  Patient is noted to have multivessel coronary artery calcification on a nongated CT study.  Initially patient was inclined to have a coronary artery calcium scoring test performed.  However, when she noted it is a self-pay test that she refuses.  Check fasting lipid profile  Discontinue aspirin 325 mg p.o. daily transition to 81 mg p.o. daily  Start Lipitor 20 mg p.o. nightly after the fasting lipid profile is complete  Plan nuclear stress test to evaluate for exercise-induced ischemia and functional status.  COVID screen prior to her exercise nuclear stress test.  Former smoker: Educated on importance of continued smoking cessation.  Orders Placed This Encounter  Procedures  . Lipid Panel With LDL/HDL Ratio  . LDL cholesterol, direct  . PCV MYOCARDIAL PERFUSION WO LEXISCAN  . EKG 12-Lead   --Continue cardiac medications as reconciled in final medication list. --Return in about 6 weeks (around 10/03/2020) for Follow up, Coronary artery calcification, Lipid. Or sooner if needed. --Continue follow-up with your primary care physician regarding the management of your other chronic comorbid conditions.  Patient's questions and concerns were addressed to her satisfaction. She voices understanding of the instructions provided during this encounter.   This note was created using a voice recognition software as a result there may be  grammatical errors inadvertently enclosed that do not reflect the  nature of this encounter. Every attempt is made to correct such errors.  Rex Kras, [place], [facility]  Pager: [phone] Office: [phone]

## 2020-08-22 NOTE — Progress Notes (Signed)
PCP is Lin Landsman, MD Referring Provider is Icard, Octavio Graves, DO  Chief Complaint  Patient presents with  . Chest Pain    New patient consultation, Rib Pain, Xray 06/12/20, Chest CT 02/24/20    HPI: 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  She had a right thoracotomy for right upper lobectomy in [place] in June 2021.  Her surgery started as a robotic procedure but was converted to a thoracotomy due to adhesions and adenopathy around the takeoff of the upper lobe bronchus.  Since surgery she has had problems with right-sided chest pain.  She complains of numbness and bulging along the right costal margin and in the right upper quadrant and also pain posteriorly around the incisions.  The areas that are numb at baseline are hypersensitive to touch.  She is currently on gabapentin 400 mg 3 times daily and tramadol 50 mg 3 times daily  She recently saw Dr. Julien Nordmann.  There was no evidence of recurrent disease.  Past Medical History:  Diagnosis Date  . Anemia    as a child only  . Hypertension   . Liver spots    " per pt"  . Lung nodule   . Wears glasses   . Wears partial dentures     Past Surgical History:  Procedure Laterality Date  . ABDOMINAL HYSTERECTOMY  1974   heavy bleeding  . ABDOMINAL HYSTERECTOMY    . BRONCHIAL BRUSHINGS  08/13/2019   Procedure: BRONCHIAL BRUSHINGS;  Surgeon: Garner Nash, DO;  Location: [place] [facility];  Service: Pulmonary;;  . BRONCHIAL WASHINGS  08/13/2019   Procedure: BRONCHIAL WASHINGS;  Surgeon: Garner Nash, DO;  Location: [place] [facility];  Service: Pulmonary;;  . COLONOSCOPY W/ BIOPSIES AND POLYPECTOMY    . DILATION AND CURETTAGE OF UTERUS    . FINE NEEDLE ASPIRATION  08/13/2019   Procedure: FINE NEEDLE ASPIRATION (FNA) LINEAR;  Surgeon: Garner Nash, DO;  Location: [place]  [facility];  Service: Pulmonary;;  . FOOT SURGERY     bilateral bunions and hammer toes  . LUNG BIOPSY  08/13/2019   Procedure: LUNG BIOPSY;  Surgeon: Garner Nash, DO;  Location: [place] [facility];  Service: Pulmonary;;  . MULTIPLE TOOTH EXTRACTIONS    . VIDEO BRONCHOSCOPY WITH ENDOBRONCHIAL NAVIGATION Right 08/13/2019   Procedure: VIDEO BRONCHOSCOPY WITH ENDOBRONCHIAL NAVIGATION;  Surgeon: Garner Nash, DO;  Location: [place];  Service: Pulmonary;  Laterality: Right;  Marland Kitchen VIDEO BRONCHOSCOPY WITH ENDOBRONCHIAL ULTRASOUND Right 08/13/2019   Procedure: VIDEO BRONCHOSCOPY WITH ENDOBRONCHIAL ULTRASOUND;  Surgeon: Garner Nash, DO;  Location: [place];  Service: Pulmonary;  Laterality: Right;    Family History  Problem Relation Age of Onset  . Heart disease Mother 18       bypass at age   . Hypertension Mother   . Cancer Maternal Aunt        breast  . Cancer Paternal Aunt   . Diabetes Neg Hx   . Stroke Neg Hx     Social History Social History   Tobacco Use  . Smoking status: Former Smoker    Packs/day: 0.30    Years: 50.00    Pack years: 15.00    Types: Cigarettes    Quit date: 2016    Years since quitting: 6.3  . Smokeless tobacco: Never Used  Vaping Use  . Vaping  Use: Never used  Substance Use Topics  . Alcohol use: Not Currently    Comment: occasional  . Drug use: Yes    Types: Oxycodone    Current Outpatient Medications  Medication Sig Dispense Refill  . acetaminophen (TYLENOL) 500 MG tablet Take 500 mg by mouth every 6 (six) hours as needed for moderate pain or headache.     Marland Kitchen aspirin 325 MG tablet Take 325 mg by mouth as needed.     . calcium carbonate (OSCAL) 1500 (600 Ca) MG TABS tablet Take 600 mg of elemental calcium by mouth 3 (three) times a week.    . gabapentin (NEURONTIN) 300 MG capsule Take 1 capsule by mouth as needed. Every 8 hours    . lisinopril (PRINIVIL,ZESTRIL) 10 MG tablet Take 20 mg by mouth in the morning.    . Multiple  Vitamins-Minerals (MULTIVITAMIN WITH MINERALS) tablet Take 1 tablet by mouth daily.    Marland Kitchen nystatin-triamcinolone ointment (MYCOLOG) Apply 1 application topically in the morning and at bedtime.    Marland Kitchen oxybutynin (DITROPAN) 5 MG tablet Take 5 mg by mouth as needed for bladder spasms.    . tizanidine (ZANAFLEX) 2 MG capsule Take 2 mg by mouth at bedtime.     . traMADol (ULTRAM) 50 MG tablet Take 1 tablet by mouth as needed. Every 8 hours    . amLODipine (NORVASC) 5 MG tablet Take 1 tablet (5 mg total) by mouth every evening. 90 tablet 0   No current facility-administered medications for this visit.    Allergies  Allergen Reactions  . Crab Extract Allergy Skin Test Swelling    Swelling of feet  . Crab [Shellfish Allergy] Swelling    Swelling of feet  . Morphine And Related     Bad dreams    Review of Systems  Constitutional: Positive for activity change. Negative for fever and unexpected weight change.  Respiratory: Negative for shortness of breath.        Chest wall pain and numbness  Cardiovascular: Negative for chest pain and leg swelling.    BP (!) 161/77 (BP Location: Left Arm, Patient Position: Sitting, Cuff Size: Normal)   Pulse 72   Temp 99 F (37.2 C) (Skin)   Resp 20   Ht 5\' 6"  (1.676 m)   Wt 177 lb 9.6 oz (80.6 kg)   SpO2 97% Comment: RA  BMI 28.67 kg/m  Physical Exam Constitutional:      General: She is not in acute distress.    Appearance: She is well-developed.  HENT:     Head: Normocephalic and atraumatic.  Eyes:     General: No scleral icterus.    Extraocular Movements: Extraocular movements intact.  Cardiovascular:     Rate and Rhythm: Normal rate and regular rhythm.     Heart sounds: Normal heart sounds. No murmur heard.   Pulmonary:     Effort: Pulmonary effort is normal. No respiratory distress.     Breath sounds: No wheezing.     Comments: Well-healed surgical scars right chest wall.  Tenderness to palpation along right chest wall and costal  margin. Diminished breath sounds right base Abdominal:     General: There is no distension.     Palpations: Abdomen is soft.     Comments: Muscle laxity right upper quadrant  Neurological:     General: No focal deficit present.     Mental Status: She is alert and oriented to person, place, and time.     Cranial Nerves: No  cranial nerve deficit.    Diagnostic Tests: I reviewed her CT images from April 2022.  Rib fractures (8 and 9) mostly healed with callus formation, possible small area of nonunion.  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  She had a right thoracotomy for right upper lobectomy in [place] in June 2021.  She is now almost a year out from surgery.  She still has some postthoracotomy pain.  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  It is not at all uncommon to have postthoracotomy pain.  Rib fractures appear healed on CT.  There is some callus formation which may be contributing to her intercostal neuralgia.  I do not see any role for rib plating or other surgical intervention.  She is on a good medical regimen with gabapentin and tramadol.  She does say that she can live with the pain at this point.  She is on 400 mg of gabapentin 3 times daily, so that dose could be pushed further, but I will defer that to Dr. Ayesha Rumpf, her primary.  Consultation with a pain specialist might be a good option for her.  Plan: Continue gabapentin and tramadol. Follow-up with Dr. Ayesha Rumpf.  Melrose Nakayama, MD [facility] [phone]

## 2020-08-23 LAB — LDL CHOLESTEROL, DIRECT: LDL Direct: 99 mg/dL (ref 0–99)

## 2020-08-23 LAB — LIPID PANEL WITH LDL/HDL RATIO
Cholesterol, Total: 175 mg/dL (ref 100–199)
HDL: 55 mg/dL (ref >39)
LDL Chol Calc (NIH): 106 mg/dL — ABNORMAL HIGH (ref 0–99)
LDL/HDL Ratio: 1.9 ratio (ref 0.0–3.2)
Triglycerides: 74 mg/dL (ref 0–149)
VLDL Cholesterol Cal: 14 mg/dL (ref 5–40)

## 2020-10-02 ENCOUNTER — Ambulatory Visit: Payer: Medicare HMO

## 2020-10-02 ENCOUNTER — Other Ambulatory Visit: Payer: Self-pay

## 2020-10-02 DIAGNOSIS — I2510 Atherosclerotic heart disease of native coronary artery without angina pectoris: Secondary | ICD-10-CM

## 2020-10-09 ENCOUNTER — Ambulatory Visit: Payer: Medicare HMO | Admitting: Cardiology

## 2020-10-09 ENCOUNTER — Other Ambulatory Visit: Payer: Self-pay

## 2020-10-09 ENCOUNTER — Encounter: Payer: Self-pay | Admitting: Cardiology

## 2020-10-09 VITALS — BP 136/73 | HR 73 | Temp 97.7°F | Resp 16 | Ht 66.0 in | Wt 179.0 lb

## 2020-10-09 DIAGNOSIS — I2510 Atherosclerotic heart disease of native coronary artery without angina pectoris: Secondary | ICD-10-CM

## 2020-10-09 DIAGNOSIS — Z87891 Personal history of nicotine dependence: Secondary | ICD-10-CM

## 2020-10-09 DIAGNOSIS — Z85118 Personal history of other malignant neoplasm of bronchus and lung: Secondary | ICD-10-CM

## 2020-10-09 DIAGNOSIS — I10 Essential (primary) hypertension: Secondary | ICD-10-CM

## 2020-10-09 DIAGNOSIS — Z712 Person consulting for explanation of examination or test findings: Secondary | ICD-10-CM

## 2020-10-09 MED ORDER — AMLODIPINE BESYLATE 5 MG PO TABS
5.0000 mg | ORAL_TABLET | Freq: Every evening | ORAL | 0 refills | Status: DC
Start: 2020-10-09 — End: 2022-06-19

## 2020-10-09 NOTE — Progress Notes (Signed)
Brandi Holmes Date of Birth: January 03, 1951 MRN: [id number] Primary Care Provider:Reese, Zadie Cleverly, MD Primary Cardiologist: Rex Kras, DO (established care 07/15/2019)  Date: 10/09/20 Last Office Visit: 08/22/2020  Chief Complaint  Patient presents with   Coronary artery calcification   Follow-up   Results    HPI  Brandi Holmes is a 70 y.o. female who presents to the office with a chief complaint of " follow-up for management of coronary artery calcification and review test results." Patient's past medical history and cardiac risk factors include: Hypertension, former smoker, postmenopausal female, advanced age, obesity.  Patient was referred to the office for management of blood pressure at the request of her PCP.  Hypertension: Initially patient's blood pressures at home are ranging between 130-140 mmHg and at the last office visit we uptitrated amlodipine to 10 mg p.o. evening.  Patient states that since up titration of amlodipine she been experiencing lower extremity swelling and would like to down titrate her antihypertensive medications.  Patient states that her home systolic blood pressures usually range between 110-125 mmHg on the current medical therapy.  She denies any lightheadedness, dizziness, near-syncope or syncope.  She tries to consume a low-salt diet.  Coronary artery calcification: In the recent past patient had a not gated CT study which noted multivessel coronary calcification.  Clinically does not illustrate symptoms of angina pectoris or heart failure.  She was taking aspirin 325 mg p.o. daily was recommended to the down transition to 81 mg p.o. daily and is tolerating it well.  And he has tolerated statin therapy well without any side effects or intolerances.  At the last office visit we discussed undergoing a formal coronary calcium scoring to evaluate disease burden.  However, it was cost prohibitive.  Patient did undergo exercise nuclear stress test  which was reported to be overall a low risk study.  Results reviewed and noted below for further reference.  History of right-sided lung cancer and underwent lobectomy with lymph node excision and surgery took place at [facility] in [place].  Now she follows up with her pulmonologist in town.    No premature coronary artery disease in the family.    FUNCTIONAL STATUS: No structured exercise program or daily routine.    ALLERGIES: Allergies  Allergen Reactions   Crab Extract Allergy Skin Test Swelling    Swelling of feet   Crab [Shellfish Allergy] Swelling    Swelling of feet   Morphine And Related     Bad dreams     MEDICATION LIST PRIOR TO VISIT: Current Outpatient Medications on File Prior to Visit  Medication Sig Dispense Refill   acetaminophen (TYLENOL) 500 MG tablet Take 500 mg by mouth every 6 (six) hours as needed for moderate pain or headache.      aspirin EC 81 MG tablet Take 81 mg by mouth daily. Swallow whole.     atorvastatin (LIPITOR) 20 MG tablet Take 1 tablet (20 mg total) by mouth at bedtime. 90 tablet 0   calcium carbonate (OSCAL) 1500 (600 Ca) MG TABS tablet Take 600 mg of elemental calcium by mouth 3 (three) times a week.     gabapentin (NEURONTIN) 300 MG capsule Take 1 capsule by mouth as needed. Every 8 hours     lisinopril (ZESTRIL) 20 MG tablet Take 20 mg by mouth in the morning.     Multiple Vitamins-Minerals (MULTIVITAMIN WITH MINERALS) tablet Take 1 tablet by mouth daily.     nystatin-triamcinolone ointment (MYCOLOG) Apply 1 application  topically in the morning and at bedtime.     oxybutynin (DITROPAN) 5 MG tablet Take 5 mg by mouth as needed for bladder spasms.     tizanidine (ZANAFLEX) 2 MG capsule Take 2 mg by mouth at bedtime.      traMADol (ULTRAM) 50 MG tablet Take 1 tablet by mouth as needed. Every 8 hours     No current facility-administered medications on file prior to visit.    PAST MEDICAL HISTORY: Past Medical History:   Diagnosis Date   Anemia    as a child only   Coronary artery calcification of native artery    Hypertension    Liver spots    " per pt"   Lung nodule    Wears glasses    Wears partial dentures     PAST SURGICAL HISTORY: Past Surgical History:  Procedure Laterality Date   ABDOMINAL HYSTERECTOMY  1974   heavy bleeding   ABDOMINAL HYSTERECTOMY     BRONCHIAL BRUSHINGS  08/13/2019   Procedure: BRONCHIAL BRUSHINGS;  Surgeon: Garner Nash, DO;  Location: [facility];  Service: Pulmonary;;   BRONCHIAL WASHINGS  08/13/2019   Procedure: BRONCHIAL WASHINGS;  Surgeon: Garner Nash, DO;  Location: [facility];  Service: Pulmonary;;   COLONOSCOPY W/ BIOPSIES AND POLYPECTOMY     DILATION AND CURETTAGE OF UTERUS     FINE NEEDLE ASPIRATION  08/13/2019   Procedure: FINE NEEDLE ASPIRATION (FNA) LINEAR;  Surgeon: Garner Nash, DO;  Location: [facility];  Service: Pulmonary;;   FOOT SURGERY     bilateral bunions and hammer toes   LUNG BIOPSY  08/13/2019   Procedure: LUNG BIOPSY;  Surgeon: Garner Nash, DO;  Location: [facility];  Service: Pulmonary;;   MULTIPLE TOOTH EXTRACTIONS     VIDEO BRONCHOSCOPY WITH ENDOBRONCHIAL NAVIGATION Right 08/13/2019   Procedure: VIDEO BRONCHOSCOPY WITH ENDOBRONCHIAL NAVIGATION;  Surgeon: Garner Nash, DO;  Location: [facility];  Service: Pulmonary;  Laterality: Right;   VIDEO BRONCHOSCOPY WITH ENDOBRONCHIAL ULTRASOUND Right 08/13/2019   Procedure: VIDEO BRONCHOSCOPY WITH ENDOBRONCHIAL ULTRASOUND;  Surgeon: Garner Nash, DO;  Location: [facility];  Service: Pulmonary;  Laterality: Right;    FAMILY HISTORY: The patient family history includes Cancer in her maternal aunt and paternal aunt; Heart disease (age of onset: 53) in her mother; Hypertension in her mother.   SOCIAL HISTORY:  The patient  reports that she quit smoking about 6 years ago. Her smoking use included cigarettes. She has a 15.00 pack-year smoking history. She has never  used smokeless tobacco. She reports previous alcohol use. She reports current drug use. Drug: Oxycodone.  Review of Systems  Constitutional: Negative for chills and fever.  HENT:  Negative for hoarse voice and nosebleeds.   Eyes:  Negative for discharge, double vision and pain.  Cardiovascular:  Negative for chest pain, claudication, dyspnea on exertion, leg swelling, near-syncope, orthopnea, palpitations, paroxysmal nocturnal dyspnea and syncope.  Respiratory:  Negative for hemoptysis and shortness of breath.   Musculoskeletal:  Negative for muscle cramps and myalgias.  Gastrointestinal:  Negative for abdominal pain, constipation, diarrhea, hematemesis, hematochezia, melena, nausea and vomiting.  Neurological:  Negative for dizziness and light-headedness.  PHYSICAL EXAM: Vitals with BMI 10/09/2020 08/22/2020 08/22/2020  Height 5\' 6"  5\' 6"  5\' 6"   Weight 179 lbs 178 lbs 3 oz 177 lbs 10 oz  BMI 28.91 55.73 22.02  Systolic 542 706 237  Diastolic 73 76 77  Pulse 73 62 72   CONSTITUTIONAL: Well-developed and well-nourished. No  acute distress.  SKIN: Skin is warm and dry. No rash noted. No cyanosis. No pallor. No jaundice HEAD: Normocephalic and atraumatic.  EYES: No scleral icterus MOUTH/THROAT: Moist oral membranes.  NECK: No JVD present. No thyromegaly noted. No carotid bruits  LYMPHATIC: No visible cervical adenopathy.  CHEST Normal respiratory effort. No intercostal retractions  LUNGS: Clear to auscultation bilaterally.  No stridor. No wheezes. No rales.  CARDIOVASCULAR: Regular rate and rhythm, positive S1-S2, no murmurs rubs or gallops appreciated ABDOMINAL: No apparent ascites.  EXTREMITIES: No peripheral edema  HEMATOLOGIC: No significant bruising NEUROLOGIC: Oriented to person, place, and time. Nonfocal. Normal muscle tone.  PSYCHIATRIC: Normal mood and affect. Normal behavior. Cooperative  RADIOLOGY: CT chest without contrast 02/24/2020: 1. Surgical changes from a right upper  lobe wedge resection. No findings suspicious for recurrent tumor. 2. Right paramediastinal subpleural atelectasis and mild bronchiectasis. 3. Slight interval enlargement of precarinal lymph nodes. Recommend continued surveillance. 4. Stable emphysematous changes. 5. 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 6. Stable three-vessel coronary artery calcifications. 7. Emphysema and aortic atherosclerosis. Aortic Atherosclerosis (ICD10-I70.0) and Emphysema (ICD10-J43.9).  CARDIAC DATABASE: EKG: 08/22/2020: Sinus bradycardia, 55 bpm, normal axis, without underlying injury pattern.  Echocardiogram: 07/27/2019: LVEF 70%, grade 1 diastolic impairment, mild LVH, mild TR, RVSP 26 mmHg.  Stress Testing:  Lexiscan Tetrofosmin stress test 10/02/2020: Exercise nuclear stress test was performed using Bruce protocol. Patient reached 6.2 METS, and 82% of age predicted maximum heart rate. Exercise capacity was low. No chest pain reported. Heart rate and hemodynamic response were normal. Peak EKG demonstrated sinus tachycardia, <80mm upsloping ST depressions in leads II, III, aVF, which is negative for ischemia. Normal wall motion and thickening. Stress LVEF 78%. SPECT images showed small sized, mild intensity, fixed defect in apical anterolateral myocardium. In absence of significant wall motion abnormality, this likely represents tissue attenuation artifact. Low risk study.  Heart Catheterization: None  LABORATORY DATA: CBC Latest Ref Rng & Units 08/10/2020 02/28/2020 08/13/2019  WBC 4.0 - 10.5 K/uL 8.6 8.4 6.9  Hemoglobin 12.0 - 15.0 g/dL 13.7 12.9 12.7  Hematocrit 36.0 - 46.0 % 43.2 42.7 41.2  Platelets 150 - 400 K/uL 323 193 309    CMP Latest Ref Rng & Units 08/10/2020 02/28/2020 08/13/2019  Glucose 70 - 99 mg/dL 90 87 121(H)  BUN 8 - 23 mg/dL 21 10 9   Creatinine 0.44 - 1.00 mg/dL 0.97 0.96 1.03(H)  Sodium 135 - 145 mmol/L 140 140 141   Potassium 3.5 - 5.1 mmol/L 4.4 4.4 4.1  Chloride 98 - 111 mmol/L 106 106 110  CO2 22 - 32 mmol/L 25 27 24   Calcium 8.9 - 10.3 mg/dL 9.5 9.4 9.6  Total Protein 6.5 - 8.1 g/dL 7.9 7.4 6.9  Total Bilirubin 0.3 - 1.2 mg/dL 0.5 0.7 0.6  Alkaline Phos 38 - 126 U/L 71 70 43  AST 15 - 41 U/L 17 15 18   ALT 0 - 44 U/L 11 8 14     Lipid Panel     Component Value Date/Time   CHOL 175 08/22/2020 1557   TRIG 74 08/22/2020 1557   HDL 55 08/22/2020 1557   CHOLHDL 3.7 09/16/2011 1000   VLDL 17 09/16/2011 1000   LDLCALC 106 (H) 08/22/2020 1557   LDLDIRECT 99 08/22/2020 1557   LABVLDL 14 08/22/2020 1557    No results found for: HGBA1C No components found for: NTPROBNP No results found for: TSH   Lipid profile: Collected: 10/02/2019 at  [facility] Total cholesterol 135, triglycerides 178, HDL 41, LDL 70.   FINAL MEDICATION LIST END OF ENCOUNTER: Meds ordered this encounter  Medications   amLODipine (NORVASC) 5 MG tablet    Sig: Take 1 tablet (5 mg total) by mouth every evening.    Dispense:  90 tablet    Refill:  0    Medications Discontinued During This Encounter  Medication Reason   amLODipine (NORVASC) 10 MG tablet      Current Outpatient Medications:    acetaminophen (TYLENOL) 500 MG tablet, Take 500 mg by mouth every 6 (six) hours as needed for moderate pain or headache. , Disp: , Rfl:    aspirin EC 81 MG tablet, Take 81 mg by mouth daily. Swallow whole., Disp: , Rfl:    atorvastatin (LIPITOR) 20 MG tablet, Take 1 tablet (20 mg total) by mouth at bedtime., Disp: 90 tablet, Rfl: 0   calcium carbonate (OSCAL) 1500 (600 Ca) MG TABS tablet, Take 600 mg of elemental calcium by mouth 3 (three) times a week., Disp: , Rfl:    gabapentin (NEURONTIN) 300 MG capsule, Take 1 capsule by mouth as needed. Every 8 hours, Disp: , Rfl:    lisinopril (ZESTRIL) 20 MG tablet, Take 20 mg by mouth in the morning., Disp: , Rfl:    Multiple Vitamins-Minerals (MULTIVITAMIN WITH MINERALS) tablet,  Take 1 tablet by mouth daily., Disp: , Rfl:    nystatin-triamcinolone ointment (MYCOLOG), Apply 1 application topically in the morning and at bedtime., Disp: , Rfl:    oxybutynin (DITROPAN) 5 MG tablet, Take 5 mg by mouth as needed for bladder spasms., Disp: , Rfl:    tizanidine (ZANAFLEX) 2 MG capsule, Take 2 mg by mouth at bedtime. , Disp: , Rfl:    traMADol (ULTRAM) 50 MG tablet, Take 1 tablet by mouth as needed. Every 8 hours, Disp: , Rfl:    amLODipine (NORVASC) 5 MG tablet, Take 1 tablet (5 mg total) by mouth every evening., Disp: 90 tablet, Rfl: 0  IMPRESSION:    ICD-10-CM   1. Coronary artery calcification of native artery  I25.10    I25.84     2. Essential hypertension  I10 amLODipine (NORVASC) 5 MG tablet    3. Former smoker  Z87.891     4. Right sided lung cancer s/p lymph node excision and RUL lobectomy.   Z85.118     5. Encounter to discuss test results  Z71.2        RECOMMENDATIONS: Brandi Holmes is a 70 y.o. female whose past medical history and cardiac risk factors include: Hypertension, coronary artery calcification, former smoker, postmenopausal female, advanced age, obesity.  Benign essential hypertension: Home blood pressures within acceptable range. Will down titrate amlodipine to 5 mg p.o. q. evening due to lower extremity swelling. Encouraged going up on lisinopril to compensate for this change.  However, patient would like to continue the current dose and will follow-up if additional medication is warranted. Low-salt diet recommended. Encouraged 30 minutes of moderate intensity exercise 5 days a week.  Coronary artery calcification: Noted on not gated CT study. Initially planned for coronary calcium score; however, it was cost prohibitive to the patient. Continue aspirin and statin therapy.   Nuclear stress test results reviewed with the patient at today's visit and noted above for further reference. Patient remains asymptomatic. No additional  diagnostic testing warranted at this time. Educated on the importance of secondary prevention.    Former smoker: Educated on importance of continued smoking cessation.  No orders of the defined types were placed in this encounter.  --Continue cardiac medications as reconciled in final medication list. --Return in about 1 year (around 10/09/2021) for Follow up, Coronary artery calcification, secondary prevention . Or sooner if needed. --Continue follow-up with your primary care physician regarding the management of your other chronic comorbid conditions.  Patient's questions and concerns were addressed to her satisfaction. She voices understanding of the instructions provided during this encounter.   This note was created using a voice recognition software as a result there may be grammatical errors inadvertently enclosed that do not reflect the nature of this encounter. Every attempt is made to correct such errors.  Rex Kras, [place], [facility]  Pager: [phone] Office: [phone]

## 2020-10-12 ENCOUNTER — Ambulatory Visit: Payer: Medicare HMO | Admitting: Cardiology

## 2020-10-17 ENCOUNTER — Ambulatory Visit: Payer: Medicare HMO | Admitting: Cardiology

## 2021-02-13 ENCOUNTER — Inpatient Hospital Stay: Payer: Medicare HMO

## 2021-02-14 ENCOUNTER — Other Ambulatory Visit: Payer: Self-pay

## 2021-02-14 ENCOUNTER — Inpatient Hospital Stay: Payer: Medicare HMO | Attending: Internal Medicine

## 2021-02-14 ENCOUNTER — Ambulatory Visit (HOSPITAL_COMMUNITY)
Admission: RE | Admit: 2021-02-14 | Discharge: 2021-02-14 | Disposition: A | Discharge status: Home / Self Care | Payer: Medicare HMO | Source: Ambulatory Visit | Attending: Internal Medicine | Admitting: Internal Medicine

## 2021-02-14 DIAGNOSIS — C3490 Malignant neoplasm of unspecified part of unspecified bronchus or lung: Secondary | ICD-10-CM

## 2021-02-14 DIAGNOSIS — I10 Essential (primary) hypertension: Secondary | ICD-10-CM | POA: Insufficient documentation

## 2021-02-14 DIAGNOSIS — C3411 Malignant neoplasm of upper lobe, right bronchus or lung: Secondary | ICD-10-CM | POA: Insufficient documentation

## 2021-02-14 DIAGNOSIS — Z90710 Acquired absence of both cervix and uterus: Secondary | ICD-10-CM | POA: Insufficient documentation

## 2021-02-14 LAB — CMP (CANCER CENTER ONLY)
ALT: 16 U/L (ref 0–44)
AST: 21 U/L (ref 15–41)
Albumin: 3.9 g/dL (ref 3.5–5.0)
Alkaline Phosphatase: 81 U/L (ref 38–126)
Anion gap: 9 (ref 5–15)
BUN: 16 mg/dL (ref 8–23)
CO2: 23 mmol/L (ref 22–32)
Calcium: 9.4 mg/dL (ref 8.9–10.3)
Chloride: 109 mmol/L (ref 98–111)
Creatinine: 0.96 mg/dL (ref 0.44–1.00)
GFR, Estimated: >60 mL/min (ref >60)
Glucose, Bld: 91 mg/dL (ref 70–99)
Potassium: 4.2 mmol/L (ref 3.5–5.1)
Sodium: 141 mmol/L (ref 135–145)
Total Bilirubin: 0.7 mg/dL (ref 0.3–1.2)
Total Protein: 7.7 g/dL (ref 6.5–8.1)

## 2021-02-14 LAB — CBC WITH DIFFERENTIAL (CANCER CENTER ONLY)
Abs Immature Granulocytes: 0.01 10*3/uL (ref 0.00–0.07)
Basophils Absolute: 0.1 10*3/uL (ref 0.0–0.1)
Basophils Relative: 1 %
Eosinophils Absolute: 0.3 10*3/uL (ref 0.0–0.5)
Eosinophils Relative: 3 %
HCT: 42 % (ref 36.0–46.0)
Hemoglobin: 13.4 g/dL (ref 12.0–15.0)
Immature Granulocytes: 0 %
Lymphocytes Relative: 38 %
Lymphs Abs: 3.4 10*3/uL (ref 0.7–4.0)
MCH: 28.8 pg (ref 26.0–34.0)
MCHC: 31.9 g/dL (ref 30.0–36.0)
MCV: 90.1 fL (ref 80.0–100.0)
Monocytes Absolute: 0.7 10*3/uL (ref 0.1–1.0)
Monocytes Relative: 8 %
Neutro Abs: 4.6 10*3/uL (ref 1.7–7.7)
Neutrophils Relative %: 50 %
Platelet Count: 310 10*3/uL (ref 150–400)
RBC: 4.66 MIL/uL (ref 3.87–5.11)
RDW: 12.1 % (ref 11.5–15.5)
WBC Count: 9 10*3/uL (ref 4.0–10.5)
nRBC: 0 % (ref 0.0–0.2)

## 2021-02-14 MED ORDER — IOHEXOL 350 MG/ML SOLN
60.0000 mL | Freq: Once | INTRAVENOUS | Status: AC | PRN
  Administered 2021-02-14: 60 mL via INTRAVENOUS

## 2021-02-15 ENCOUNTER — Inpatient Hospital Stay (HOSPITAL_BASED_OUTPATIENT_CLINIC_OR_DEPARTMENT_OTHER): Payer: Medicare HMO | Admitting: Internal Medicine

## 2021-02-15 VITALS — BP 122/85 | HR 61 | Temp 97.5°F | Resp 20 | Ht 66.0 in | Wt 179.9 lb

## 2021-02-15 DIAGNOSIS — C3490 Malignant neoplasm of unspecified part of unspecified bronchus or lung: Secondary | ICD-10-CM

## 2021-02-15 DIAGNOSIS — I10 Essential (primary) hypertension: Secondary | ICD-10-CM | POA: Diagnosis not present

## 2021-02-15 DIAGNOSIS — C3411 Malignant neoplasm of upper lobe, right bronchus or lung: Secondary | ICD-10-CM | POA: Diagnosis present

## 2021-02-15 DIAGNOSIS — Z90710 Acquired absence of both cervix and uterus: Secondary | ICD-10-CM | POA: Diagnosis not present

## 2021-02-15 NOTE — Progress Notes (Signed)
[place] [phone]   Fax:(336) [place], [place], [place] [address]  DIAGNOSIS: Stage IIb (T1b, N1, M0) non-small cell lung cancer, adenocarcinoma presented with right upper lobe lung nodule  PRIOR THERAPY:  Status post right upper lobectomy with lymph node dissection on September 30, 2019 in [place].  She declines adjuvant systemic chemotherapy.  CURRENT THERAPY: Observation.  INTERVAL HISTORY: Brandi Holmes 70 y.o. female returns to the clinic today for follow-up visit.  The patient continues to have pain on the right side of the chest when she was trying to grab few things from her closet.  She denied having any current shortness of breath, cough or hemoptysis.  She has no nausea, vomiting, diarrhea or constipation.  She has no headache or visual changes.  She has no recent weight loss or night sweats.  She had repeat CT scan of the chest performed yesterday and she is here for evaluation and discussion of her risk her results.  MEDICAL HISTORY: Past Medical History:  Diagnosis Date   Anemia    as a child only   Coronary artery calcification of native artery    Hypertension    Liver spots    " per pt"   Lung nodule    Wears glasses    Wears partial dentures     ALLERGIES:  is allergic to crab extract allergy skin test, crab [shellfish allergy], and morphine and related.  MEDICATIONS:  Current Outpatient Medications  Medication Sig Dispense Refill   acetaminophen (TYLENOL) 500 MG tablet Take 500 mg by mouth every 6 (six) hours as needed for moderate pain or headache.      amLODipine (NORVASC) 5 MG tablet Take 1 tablet (5 mg total) by mouth every evening. 90 tablet 0   aspirin EC 81 MG tablet Take 81 mg by mouth daily. Swallow whole.     atorvastatin (LIPITOR) 20 MG tablet Take 1 tablet (20 mg total) by mouth at bedtime. 90 tablet 0   calcium carbonate (OSCAL) 1500 (600 Ca) MG  TABS tablet Take 600 mg of elemental calcium by mouth 3 (three) times a week.     gabapentin (NEURONTIN) 300 MG capsule Take 1 capsule by mouth as needed. Every 8 hours     lisinopril (ZESTRIL) 20 MG tablet Take 20 mg by mouth in the morning.     Multiple Vitamins-Minerals (MULTIVITAMIN WITH MINERALS) tablet Take 1 tablet by mouth daily.     nystatin-triamcinolone ointment (MYCOLOG) Apply 1 application topically in the morning and at bedtime.     oxybutynin (DITROPAN) 5 MG tablet Take 5 mg by mouth as needed for bladder spasms.     tizanidine (ZANAFLEX) 2 MG capsule Take 2 mg by mouth at bedtime.      traMADol (ULTRAM) 50 MG tablet Take 1 tablet by mouth as needed. Every 8 hours     No current facility-administered medications for this visit.    SURGICAL HISTORY:  Past Surgical History:  Procedure Laterality Date   ABDOMINAL HYSTERECTOMY  1974   heavy bleeding   ABDOMINAL HYSTERECTOMY     BRONCHIAL BRUSHINGS  08/13/2019   Procedure: BRONCHIAL BRUSHINGS;  Surgeon: Garner Nash, DO;  Location: [facility];  Service: Pulmonary;;   BRONCHIAL WASHINGS  08/13/2019   Procedure: BRONCHIAL WASHINGS;  Surgeon: Garner Nash, DO;  Location: [facility];  Service: Pulmonary;;   COLONOSCOPY W/ BIOPSIES AND POLYPECTOMY  DILATION AND CURETTAGE OF UTERUS     FINE NEEDLE ASPIRATION  08/13/2019   Procedure: FINE NEEDLE ASPIRATION (FNA) LINEAR;  Surgeon: Garner Nash, DO;  Location: [facility];  Service: Pulmonary;;   FOOT SURGERY     bilateral bunions and hammer toes   LUNG BIOPSY  08/13/2019   Procedure: LUNG BIOPSY;  Surgeon: Garner Nash, DO;  Location: [facility];  Service: Pulmonary;;   MULTIPLE TOOTH EXTRACTIONS     VIDEO BRONCHOSCOPY WITH ENDOBRONCHIAL NAVIGATION Right 08/13/2019   Procedure: VIDEO BRONCHOSCOPY WITH ENDOBRONCHIAL NAVIGATION;  Surgeon: Garner Nash, DO;  Location: [facility];  Service: Pulmonary;  Laterality: Right;   VIDEO BRONCHOSCOPY WITH  ENDOBRONCHIAL ULTRASOUND Right 08/13/2019   Procedure: VIDEO BRONCHOSCOPY WITH ENDOBRONCHIAL ULTRASOUND;  Surgeon: Garner Nash, DO;  Location: [facility];  Service: Pulmonary;  Laterality: Right;    REVIEW OF SYSTEMS:  A comprehensive review of systems was negative except for: Respiratory: positive for pleurisy/chest pain   PHYSICAL EXAMINATION: General appearance: alert, cooperative, and no distress Head: Normocephalic, without obvious abnormality, atraumatic Neck: no adenopathy, no JVD, supple, symmetrical, trachea midline, and thyroid not enlarged, symmetric, no tenderness/mass/nodules Lymph nodes: Cervical, supraclavicular, and axillary nodes normal. Resp: clear to auscultation bilaterally Back: symmetric, no curvature. ROM normal. No CVA tenderness. Cardio: regular rate and rhythm, S1, S2 normal, no murmur, click, rub or gallop GI: soft, non-tender; bowel sounds normal; no masses,  no organomegaly Extremities: extremities normal, atraumatic, no cyanosis or edema  ECOG PERFORMANCE STATUS: 1 - Symptomatic but completely ambulatory  Blood pressure 122/85, pulse 61, temperature (!) 97.5 F (36.4 C), temperature source Temporal, resp. rate 20, height 5\' 6"  (1.676 m), weight 179 lb 14.4 oz (81.6 kg), SpO2 100 %.  LABORATORY DATA: Lab Results  Component Value Date   WBC 9.0 02/14/2021   HGB 13.4 02/14/2021   HCT 42.0 02/14/2021   MCV 90.1 02/14/2021   PLT 310 02/14/2021      Chemistry      Component Value Date/Time   NA 141 02/14/2021 1528   NA 142 07/22/2019 1522   K 4.2 02/14/2021 1528   CL 109 02/14/2021 1528   CO2 23 02/14/2021 1528   BUN 16 02/14/2021 1528   BUN 13 07/22/2019 1522   CREATININE 0.96 02/14/2021 1528   CREATININE 0.91 09/16/2011 1000      Component Value Date/Time   CALCIUM 9.4 02/14/2021 1528   ALKPHOS 81 02/14/2021 1528   AST 21 02/14/2021 1528   ALT 16 02/14/2021 1528   BILITOT 0.7 02/14/2021 1528       RADIOGRAPHIC STUDIES: CT Chest W  Contrast  Result Date: 02/14/2021 CLINICAL DATA:  Small-cell lung cancer, restaging. Surgery last year. Right-sided pain after twisting injury. Mild centrilobular and paraseptal emphysema. EXAM: CT CHEST WITH CONTRAST TECHNIQUE: Multidetector CT imaging of the chest was performed during intravenous contrast administration. CONTRAST:  63mL OMNIPAQUE IOHEXOL 350 MG/ML SOLN COMPARISON:  08/10/2020 FINDINGS: Cardiovascular: Aortic atherosclerosis. Normal heart size, without pericardial effusion. Three vessel coronary artery calcification. No central pulmonary embolism, on this non-dedicated study. Mediastinum/Nodes: No supraclavicular adenopathy. No axillary adenopathy. 9 mm precarinal node measures 11 mm on the prior. Small right hilar nodes are unchanged, without adenopathy. Lungs/Pleura: No pleural fluid.  Right upper lobectomy. Upper Abdomen: Posterior right hepatic lobe 1.5 cm hepatic cyst. Other well-circumscribed smaller lesions are similar and likely cysts and bile duct hamartomas. No suspicious liver lesion. Normal imaged portions of the spleen, stomach, pancreas, gallbladder, adrenal glands, kidneys. Musculoskeletal:  5 mm left breast nodule is nonspecific, similar medially on 69/2. Osteopenia. Remote posterior right rib surgical defects. No evidence of acute or subacute fracture. Lower cervical spondylosis. Mild right hemidiaphragm elevation. IMPRESSION: 1. Status post right lower lobectomy, without recurrent or metastatic disease. 2. Posterior right rib remote surgical defects, without evidence of acute fracture or other explanation for acute pain. 3. Aortic atherosclerosis (ICD10-I70.0), coronary artery atherosclerosis and emphysema (ICD10-J43.9). Electronically Signed   By: Abigail Miyamoto M.D.   On: 02/14/2021 18:24     ASSESSMENT AND PLAN: This is a pleasant 70 years old African-American female diagnosed with a stage IIb (T1b, N1, M0) non-small cell lung cancer, adenocarcinoma status post a right upper  lobectomy with lymph node dissection on September 30, 2019 in [place].  She declined adjuvant systemic chemotherapy at that time. The patient is currently on observation and she is feeling fine today with no concerning complaints except for the persistent pain on the right side of the chest from the surgical scar. The patient continues to complain of pain in the right side of the chest.  She is currently on tramadol and gabapentin. She had repeat CT scan of the chest performed yesterday.  I personally and independently reviewed the scans and discussed the results with the patient today. Her scan showed no concerning findings for disease progression. High recommended for her to continue on observation with repeat CT scan of the chest in 6 months. Regarding the right-sided chest pain, she will continue with her current pain medication and the patient was advised to apply over-the-counter lidocaine patch if needed. She was advised to call immediately if she has any other concerning symptoms in the interval. The patient voices understanding of current disease status and treatment options and is in agreement with the current care plan.  All questions were answered. The patient knows to call the clinic with any problems, questions or concerns. We can certainly see the patient much sooner if necessary.  Disclaimer: This note was dictated with voice recognition software. Similar sounding words can inadvertently be transcribed and may not be corrected upon review.

## 2021-02-21 ENCOUNTER — Other Ambulatory Visit: Payer: Self-pay

## 2021-02-21 ENCOUNTER — Encounter: Payer: Self-pay | Admitting: Pulmonary Disease

## 2021-02-21 ENCOUNTER — Ambulatory Visit (INDEPENDENT_AMBULATORY_CARE_PROVIDER_SITE_OTHER): Payer: Medicare HMO | Admitting: Pulmonary Disease

## 2021-02-21 VITALS — BP 110/70 | HR 66 | Temp 98.6°F | Ht 66.0 in | Wt 182.6 lb

## 2021-02-21 DIAGNOSIS — J432 Centrilobular emphysema: Secondary | ICD-10-CM

## 2021-02-21 DIAGNOSIS — Z902 Acquired absence of lung [part of]: Secondary | ICD-10-CM | POA: Diagnosis not present

## 2021-02-21 DIAGNOSIS — C3491 Malignant neoplasm of unspecified part of right bronchus or lung: Secondary | ICD-10-CM | POA: Diagnosis not present

## 2021-02-21 DIAGNOSIS — Z87891 Personal history of nicotine dependence: Secondary | ICD-10-CM | POA: Diagnosis not present

## 2021-02-21 NOTE — Patient Instructions (Signed)
Thank you for visiting Dr. Valeta Harms at [facility]. Today we recommend the following:  Follow up with [place] in 1 year or as needed   Return in about 1 year (around 02/21/2022) for with Eric Form, NP, or Dr. Valeta Harms.    Please do your part to reduce the spread of COVID-19.

## 2021-02-21 NOTE — Progress Notes (Signed)
Synopsis: Referred in April 2021 for abnormal CT, lung nodule by Lin Landsman, MD  Subjective:   PATIENT ID: Brandi Holmes GENDER: female DOB: 1951-01-20, MRN: [id number]  Chief Complaint  Patient presents with   Follow-up    This is a 70 year old female past medical history of hypertension, former smoker quit in 2006.  Patient initially presented to the emergency department on 07/31/2019 with right lower quadrant abdominal pain during the patient's evaluation she had CT imaging of the abdomen pelvis.  Prior to the ED visit with neck pain had a CT soft tissue of the neck and chest.  On 07/20/2019 patient CT chest revealed a 2.1 cm spiculated right upper lobe mass concerning for a primary lung cancer.  Patient was referred here for evaluation of lung mass.  OV for 08/05/2019: Patient seen today in the office.  Has lots of questions regarding her CT scan.  A little bit anxious today.  Denies hemoptysis.  Denies weight loss she does complain of a leg pain that has been going on for some time radiates into her groin.  Denies fevers chills.  OV 02/14/2020: She was initially taken for navigational bronchoscopy and tissue diagnosis of the upper lobe mass.  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  She traveled to [facility] to visit with family and establish care with a thoracic surgeon there.  She was subsequently taken to the operating room for robotic right upper lobe lobectomy at [facility] by Dr. Jearld Lesch.  Notes and documentation regarding hospitalization and surgery were reviewed in epic care everywhere.  She was diagnosed with a T1b, N1M0 malignancy with 3+ nodes.  She was referred to medical oncology met with Dr. Cephus Shelling which recommended adjuvant chemotherapy.  She declined as she was making plans for return to [place].  She is here now in [place].  It took some time for her to recover.  She does have postop pain at the incision sites.  Her breathing is much  improved compared to previous.  Denies hemoptysis.  OV 02/21/2021: Here today for follow-up.  Overall doing well for the past year.  She did see medical oncology and made the decision not to receive adjuvant chemotherapy.  She has been traveling some of this year with her [employer] ladies group.     Past Medical History:  Diagnosis Date   Anemia    as a child only   Coronary artery calcification of native artery    Hypertension    Liver spots    " per pt"   Lung nodule    Wears glasses    Wears partial dentures      Family History  Problem Relation Age of Onset   Heart disease Mother 45       bypass at age    Hypertension Mother    Cancer Maternal Aunt        breast   Cancer Paternal Aunt    Diabetes Neg Hx    Stroke Neg Hx      Past Surgical History:  Procedure Laterality Date   ABDOMINAL HYSTERECTOMY  1974   heavy bleeding   ABDOMINAL HYSTERECTOMY     BRONCHIAL BRUSHINGS  08/13/2019   Procedure: BRONCHIAL BRUSHINGS;  Surgeon: Garner Nash, DO;  Location: [place] [facility];  Service: Pulmonary;;   BRONCHIAL WASHINGS  08/13/2019   Procedure: BRONCHIAL WASHINGS;  Surgeon: Garner Nash, DO;  Location: [facility];  Service: Pulmonary;;   COLONOSCOPY W/ BIOPSIES AND POLYPECTOMY  DILATION AND CURETTAGE OF UTERUS     FINE NEEDLE ASPIRATION  08/13/2019   Procedure: FINE NEEDLE ASPIRATION (FNA) LINEAR;  Surgeon: Garner Nash, DO;  Location: [place] [facility];  Service: Pulmonary;;   FOOT SURGERY     bilateral bunions and hammer toes   LUNG BIOPSY  08/13/2019   Procedure: LUNG BIOPSY;  Surgeon: Garner Nash, DO;  Location: [place] [facility];  Service: Pulmonary;;   MULTIPLE TOOTH EXTRACTIONS     VIDEO BRONCHOSCOPY WITH ENDOBRONCHIAL NAVIGATION Right 08/13/2019   Procedure: VIDEO BRONCHOSCOPY WITH ENDOBRONCHIAL NAVIGATION;  Surgeon: Garner Nash, DO;  Location: [place];  Service: Pulmonary;  Laterality: Right;   VIDEO BRONCHOSCOPY WITH ENDOBRONCHIAL ULTRASOUND Right  08/13/2019   Procedure: VIDEO BRONCHOSCOPY WITH ENDOBRONCHIAL ULTRASOUND;  Surgeon: Garner Nash, DO;  Location: [place];  Service: Pulmonary;  Laterality: Right;    Social History   Socioeconomic History   Marital status: Divorced    Spouse name: Not on file   Number of children: 2   Years of education: Not on file   Highest education level: Not on file  Occupational History   Not on file  Tobacco Use   Smoking status: Former    Packs/day: 0.30    Years: 50.00    Pack years: 15.00    Types: Cigarettes    Quit date: 2016    Years since quitting: 6.8   Smokeless tobacco: Never  Vaping Use   Vaping Use: Never used  Substance and Sexual Activity   Alcohol use: Not Currently    Comment: occasional   Drug use: Yes    Types: Oxycodone   Sexual activity: Not Currently  Other Topics Concern   Not on file  Social History Narrative   ** Merged History Encounter **       Lives alone.  Divoced.  Adult children- 1 living, 1 deceased.   Social Determinants of Health   Financial Resource Strain: Not on file  Food Insecurity: Not on file  Transportation Needs: Not on file  Physical Activity: Not on file  Stress: Not on file  Social Connections: Not on file  Intimate Partner Violence: Not on file     Allergies  Allergen Reactions   Crab Extract Allergy Skin Test Swelling    Swelling of feet   Crab [Shellfish Allergy] Swelling    Swelling of feet   Morphine And Related     Bad dreams     Outpatient Medications Prior to Visit  Medication Sig Dispense Refill   acetaminophen (TYLENOL) 500 MG tablet Take 500 mg by mouth every 6 (six) hours as needed for moderate pain or headache.      aspirin EC 81 MG tablet Take 81 mg by mouth at bedtime. Swallow whole.     calcium carbonate (OSCAL) 1500 (600 Ca) MG TABS tablet Take 600 mg of elemental calcium by mouth 3 (three) times a week.     gabapentin (NEURONTIN) 400 MG capsule Take 400 mg by mouth 3 (three) times daily as  needed.     lisinopril (ZESTRIL) 20 MG tablet Take 20 mg by mouth in the morning.     Multiple Vitamins-Minerals (MULTIVITAMIN WITH MINERALS) tablet Take 1 tablet by mouth daily.     nystatin-triamcinolone ointment (MYCOLOG) Apply 1 application topically in the morning and at bedtime.     oxybutynin (DITROPAN) 5 MG tablet Take 5 mg by mouth as needed for bladder spasms.     tizanidine (ZANAFLEX) 2 MG capsule Take  2 mg by mouth at bedtime.      traMADol (ULTRAM) 50 MG tablet Take 1 tablet by mouth as needed. Every 8 hours     amLODipine (NORVASC) 5 MG tablet Take 1 tablet (5 mg total) by mouth every evening. (Patient taking differently: Take 5 mg by mouth at bedtime.) 90 tablet 0   atorvastatin (LIPITOR) 20 MG tablet Take 1 tablet (20 mg total) by mouth at bedtime. 90 tablet 0   No facility-administered medications prior to visit.    Review of Systems  Constitutional:  Negative for chills, fever, malaise/fatigue and weight loss.  HENT:  Negative for hearing loss, sore throat and tinnitus.   Eyes:  Negative for blurred vision and double vision.  Respiratory:  Negative for cough, hemoptysis, sputum production, shortness of breath, wheezing and stridor.   Cardiovascular:  Negative for chest pain, palpitations, orthopnea, leg swelling and PND.  Gastrointestinal:  Negative for abdominal pain, constipation, diarrhea, heartburn, nausea and vomiting.  Genitourinary:  Negative for dysuria, hematuria and urgency.  Musculoskeletal:  Negative for joint pain and myalgias.  Skin:  Negative for itching and rash.  Neurological:  Negative for dizziness, tingling, weakness and headaches.  Endo/Heme/Allergies:  Negative for environmental allergies. Does not bruise/bleed easily.  Psychiatric/Behavioral:  Negative for depression. The patient is not nervous/anxious and does not have insomnia.   All other systems reviewed and are negative.   Objective:  Physical Exam Vitals reviewed.  Constitutional:       General: She is not in acute distress.    Appearance: She is well-developed.  HENT:     Head: Normocephalic and atraumatic.  Eyes:     General: No scleral icterus.    Conjunctiva/sclera: Conjunctivae normal.     Pupils: Pupils are equal, round, and reactive to light.  Neck:     Vascular: No JVD.     Trachea: No tracheal deviation.  Cardiovascular:     Rate and Rhythm: Normal rate and regular rhythm.     Heart sounds: Normal heart sounds. No murmur heard. Pulmonary:     Effort: Pulmonary effort is normal. No tachypnea, accessory muscle usage or respiratory distress.     Breath sounds: No stridor. No wheezing, rhonchi or rales.  Abdominal:     General: Bowel sounds are normal. There is no distension.     Palpations: Abdomen is soft.     Tenderness: There is no abdominal tenderness.  Musculoskeletal:        General: No tenderness.     Cervical back: Neck supple.  Lymphadenopathy:     Cervical: No cervical adenopathy.  Skin:    General: Skin is warm and dry.     Capillary Refill: Capillary refill takes less than 2 seconds.     Findings: No rash.  Neurological:     Mental Status: She is alert and oriented to person, place, and time.  Psychiatric:        Behavior: Behavior normal.     Vitals:   02/21/21 1051  BP: 110/70  Pulse: 66  Temp: 98.6 F (37 C)  TempSrc: Oral  SpO2: 100%  Weight: 182 lb 9.6 oz (82.8 kg)  Height: 5' 6"  (1.676 m)   100% on RA BMI Readings from Last 3 Encounters:  02/21/21 29.47 kg/m  02/15/21 29.04 kg/m  10/09/20 28.89 kg/m   Wt Readings from Last 3 Encounters:  02/21/21 182 lb 9.6 oz (82.8 kg)  02/15/21 179 lb 14.4 oz (81.6 kg)  10/09/20 179 lb (81.2 kg)  CBC    Component Value Date/Time   WBC 9.0 02/14/2021 1528   WBC 6.9 08/13/2019 0605   RBC 4.66 02/14/2021 1528   HGB 13.4 02/14/2021 1528   HCT 42.0 02/14/2021 1528   PLT 310 02/14/2021 1528   MCV 90.1 02/14/2021 1528   MCH 28.8 02/14/2021 1528   MCHC 31.9 02/14/2021  1528   RDW 12.1 02/14/2021 1528   LYMPHSABS 3.4 02/14/2021 1528   MONOABS 0.7 02/14/2021 1528   EOSABS 0.3 02/14/2021 1528   BASOSABS 0.1 02/14/2021 1528    Chest Imaging: 07/20/2019 CT chest 2.1 cm spiculated right upper lobe mass. The patient's images have been independently reviewed by me.    CT scan of the chest October 28, 2019 completed at [facility] results reviewed in epic care everywhere Postoperative changes documented.  Atherosclerotic coronary disease, small right effusion minimal clustered nodularity within the left upper lobe unchanged from previous.  Pulmonary Functions Testing Results: No flowsheet data found.  FeNO: none  Pathology:   June 17th Path report from Surgery Histologic Type: Invasive adenocarcinoma, acinar and solid patterns Grade (G1-G4): G2 Spread Through Air Spaces (STAS): Present Visceral Pleural Invasion: Not identified Lymphovascular Invasion: Present Direct Invasion of Adjacent Structures: No adjacent structures present Margin Status: Bronchial Margin: Uninvolved by adenocarcinoma Vascular Margin: Uninvolved by adenocarcinoma Parenchymal Margin: Uninvolved by adenocarcinoma Other Attached Tissue Margin: Uninvolved by adenocarcinoma Closest resection margin (specify which, in cm):1.5 cm from parenchymal margin Treatment Effect: No known presurgical therapy Primary Tumor (pT): pT1b Regional Lymph Node (pN): pN1 # of LNs Examined: 9 Specify nodal station(s) examined: 4R, 7R, 9R, 10R, 11R # of Positive LNs: 3 Specify nodal station(s) involved: 10R, 11R, 12R   Echocardiogram: none  Heart Catheterization: none     Assessment & Plan:     ICD-10-CM   1. Adenocarcinoma of right lung (HCC)  C34.91     2. S/P RUL lobectomy of lung  Z90.2     3. Former smoker  Z87.891     4. Centrilobular emphysema (HCC)  J43.2       Assessment:   This is a 70 year old female, underwent navigational bronchoscopy with a right upper lobe pulmonary  nodule consistent with adenocarcinoma.  Martin Majestic out to [place] to have this resected so she could be with family postoperatively.  She did well with a diagnosis of a T1b, N1, M0 adenocarcinoma with 3 positive lymph nodes.  She was referred for adjuvant chemotherapy but she declined.  She has met with Dr. Earlie Server here at [facility] health return reason for  Plan: Continue current inhaler regimen and Continue follow-up with medical oncology She will need continued CT surveillance imaging. She can see [place] in 1 year or as needed.    Current Outpatient Medications:    acetaminophen (TYLENOL) 500 MG tablet, Take 500 mg by mouth every 6 (six) hours as needed for moderate pain or headache. , Disp: , Rfl:    aspirin EC 81 MG tablet, Take 81 mg by mouth at bedtime. Swallow whole., Disp: , Rfl:    calcium carbonate (OSCAL) 1500 (600 Ca) MG TABS tablet, Take 600 mg of elemental calcium by mouth 3 (three) times a week., Disp: , Rfl:    gabapentin (NEURONTIN) 400 MG capsule, Take 400 mg by mouth 3 (three) times daily as needed., Disp: , Rfl:    lisinopril (ZESTRIL) 20 MG tablet, Take 20 mg by mouth in the morning., Disp: , Rfl:    Multiple Vitamins-Minerals (MULTIVITAMIN WITH MINERALS) tablet, Take 1  tablet by mouth daily., Disp: , Rfl:    nystatin-triamcinolone ointment (MYCOLOG), Apply 1 application topically in the morning and at bedtime., Disp: , Rfl:    oxybutynin (DITROPAN) 5 MG tablet, Take 5 mg by mouth as needed for bladder spasms., Disp: , Rfl:    tizanidine (ZANAFLEX) 2 MG capsule, Take 2 mg by mouth at bedtime. , Disp: , Rfl:    traMADol (ULTRAM) 50 MG tablet, Take 1 tablet by mouth as needed. Every 8 hours, Disp: , Rfl:    amLODipine (NORVASC) 5 MG tablet, Take 1 tablet (5 mg total) by mouth every evening. (Patient taking differently: Take 5 mg by mouth at bedtime.), Disp: 90 tablet, Rfl: 0   atorvastatin (LIPITOR) 20 MG tablet, Take 1 tablet (20 mg total) by mouth at bedtime., Disp: 90 tablet, Rfl:  0   Garner Nash, DO [facility] 02/21/2021 10:58 AM

## 2021-05-25 IMAGING — CT CT ABD-PELV W/ CM
2 of 5 series · 16 of 46 positions shown, 18 images · IV contrast (OMNIPAQUE 300)
Comparison: 07/26/2019

CLINICAL DATA: Nausea and vomiting, right inguinal pain for 1 week,
belching

EXAM:
CT ABDOMEN AND PELVIS WITH CONTRAST
TECHNIQUE: Multidetector CT imaging of the abdomen and pelvis was performed
using the standard protocol following bolus administration of
intravenous contrast.
CONTRAST:  100mL OMNIPAQUE IOHEXOL 300 MG/ML  SOLN

[Series 2: axial st · axial · 0.74mm/px · z∈[-888,-518]mm · 13 of 86 slices shown, 15 images]
[im 6/86  soft-tissue]
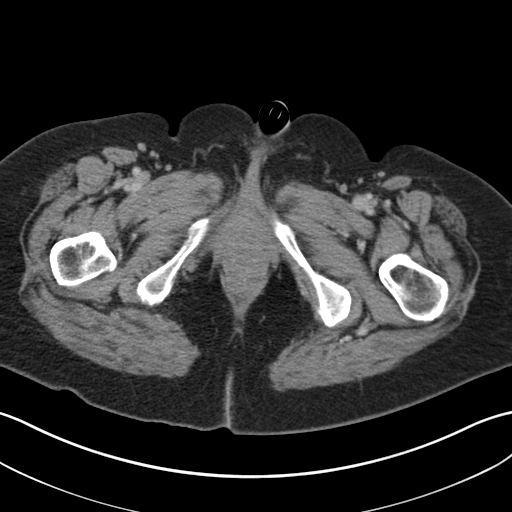
[im 6/86  bone]
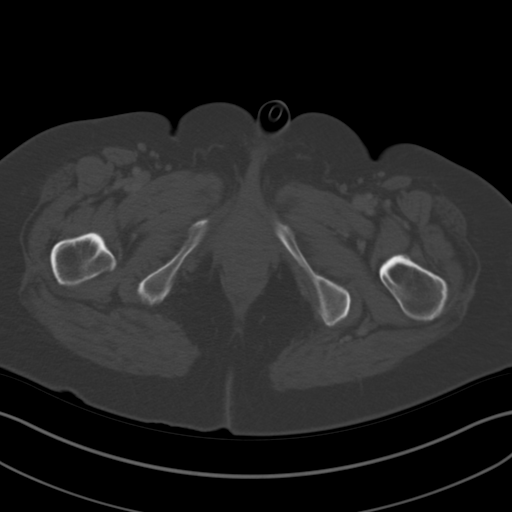
[im 12/86  soft-tissue]
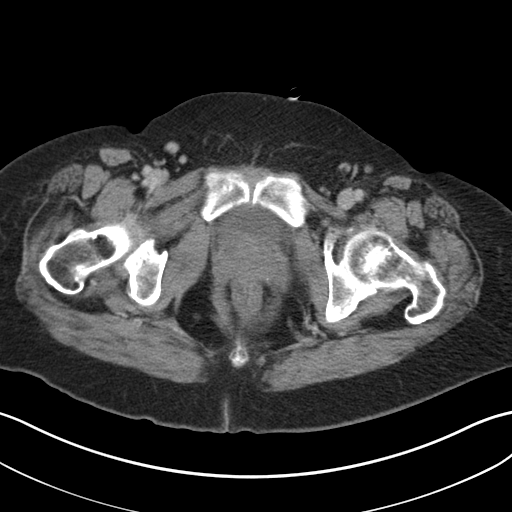
[im 18/86  soft-tissue]
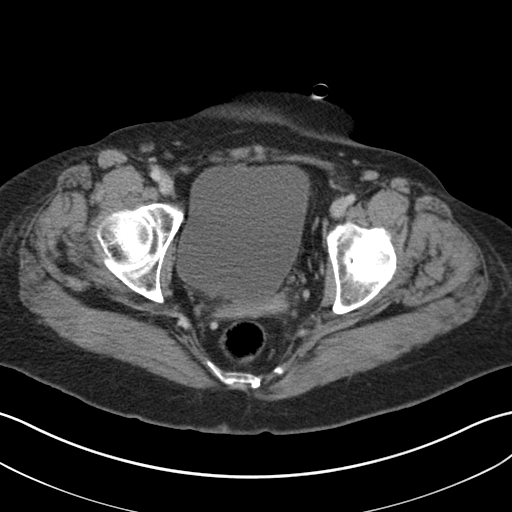
[im 23/86  soft-tissue]
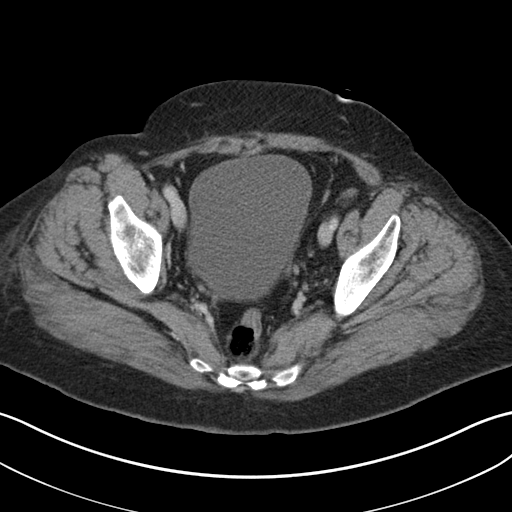
[im 29/86  soft-tissue]
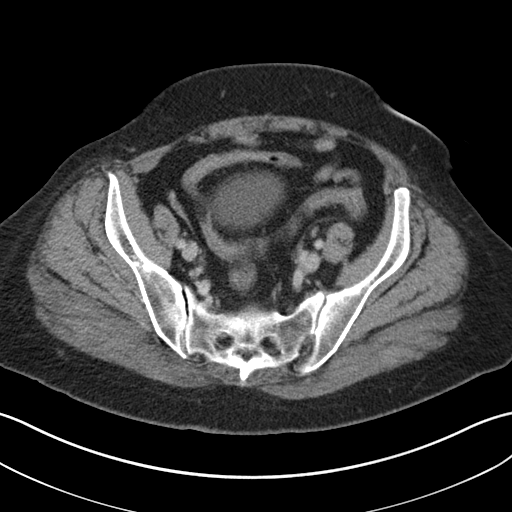
[im 35/86  soft-tissue]
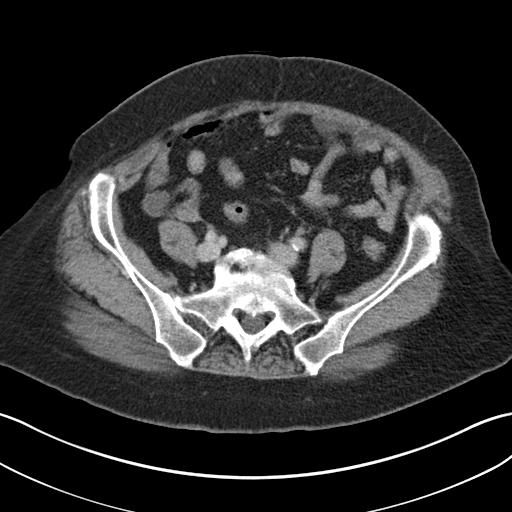
[im 46/86  soft-tissue]
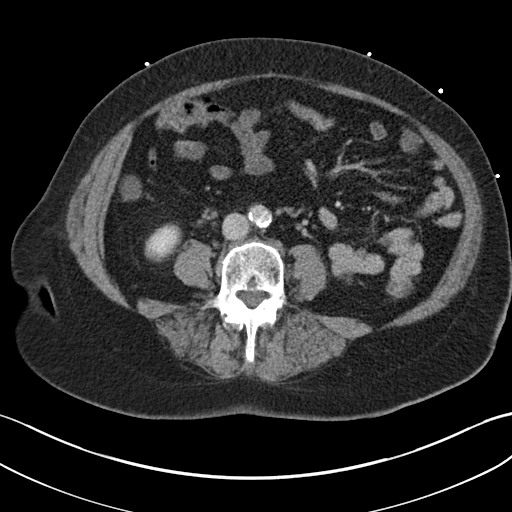
[im 52/86  soft-tissue]
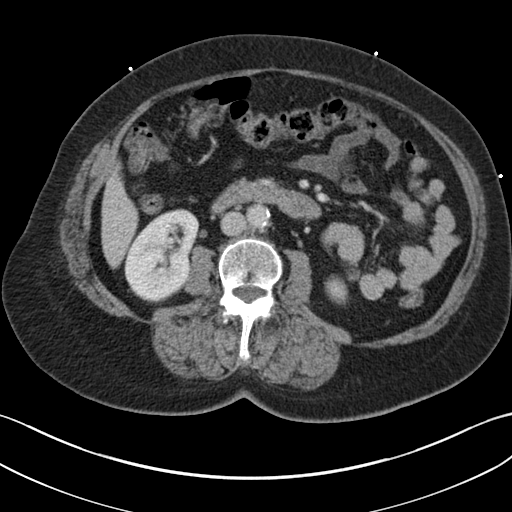
[im 57/86  soft-tissue]
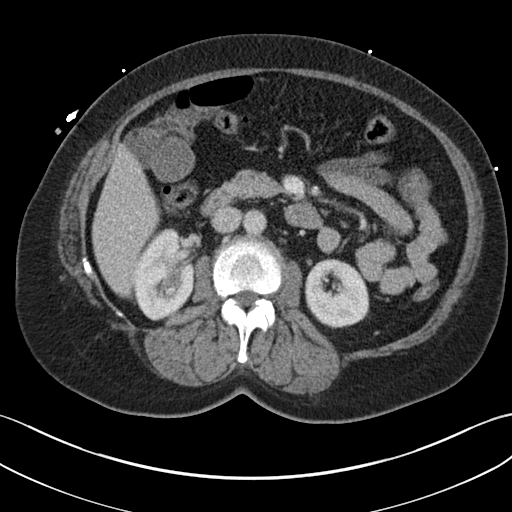
[im 57/86  bone]
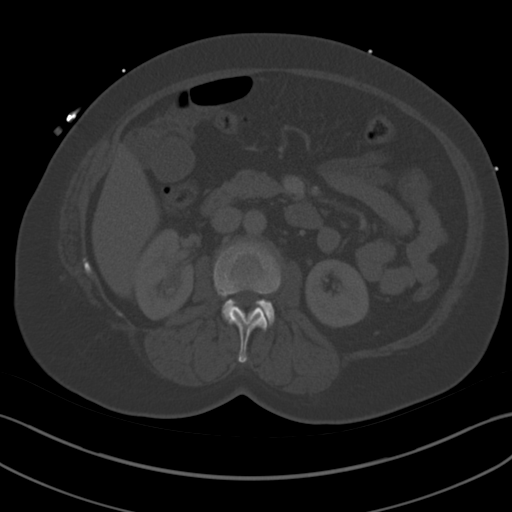
[im 63/86  soft-tissue]
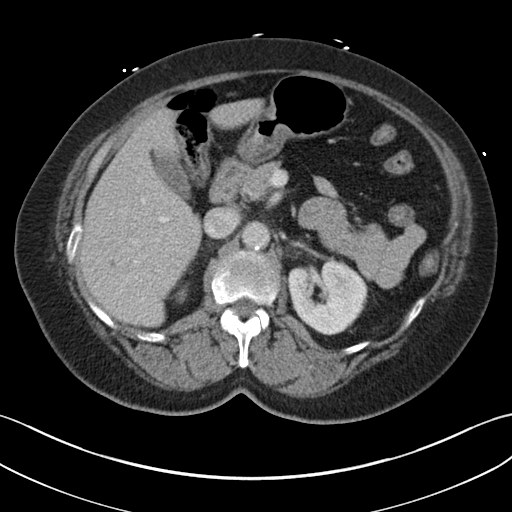
[im 69/86  soft-tissue]
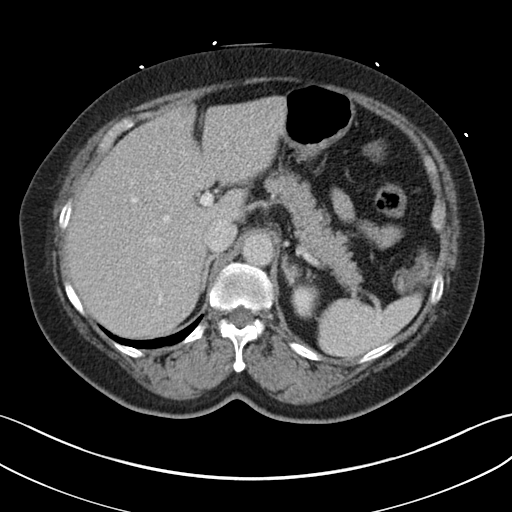
[im 74/86  soft-tissue]
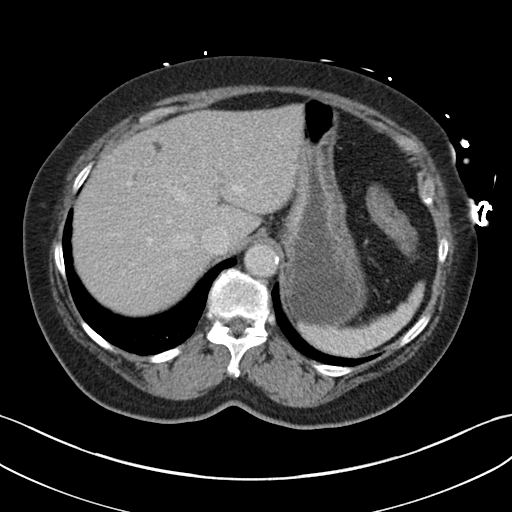
[im 80/86  soft-tissue]
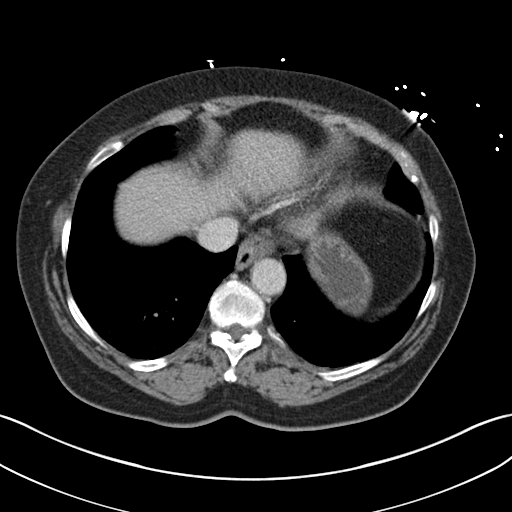

[Series 5: coronal st · coronal · 0.68mm/px · 3 of 132 slices shown]
[im 44/132  soft-tissue]
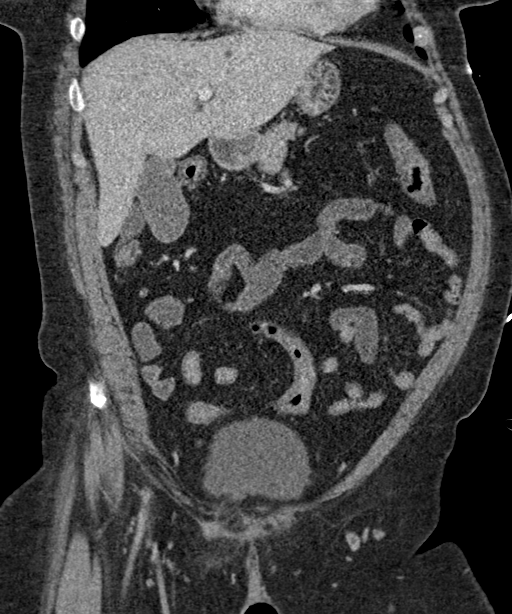
[im 59/132  soft-tissue]
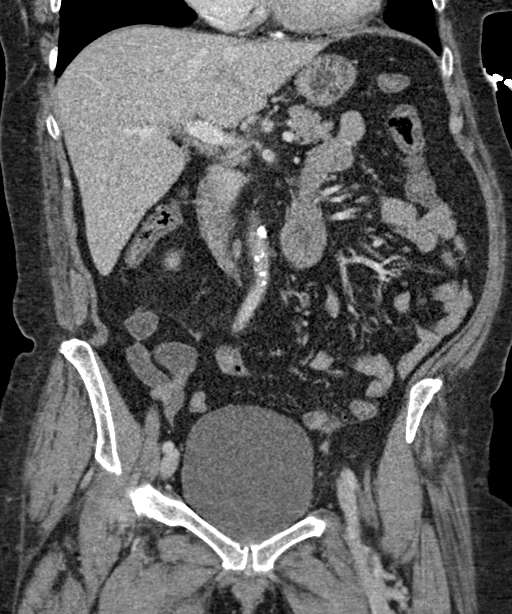
[im 73/132  soft-tissue]
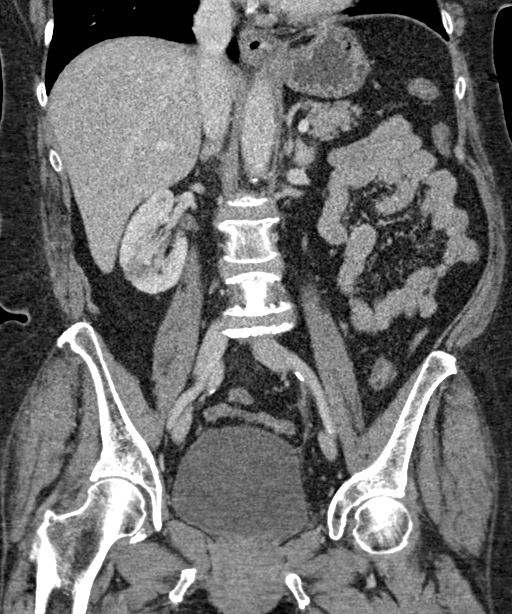

[16 of 46 positions shown; findings below may reference images not displayed]

FINDINGS: Lower chest: No acute pleural or parenchymal lung disease.

Hepatobiliary: Stable scattered subcentimeter hypodensities
throughout the liver, too small to characterize but likely
reflecting small cysts. No biliary duct dilation. The gallbladder is
unremarkable.

Pancreas: Unremarkable. No pancreatic ductal dilatation or
surrounding inflammatory changes.

Spleen: Normal in size without focal abnormality.

Adrenals/Urinary Tract: There is an indeterminate hypodensity within
the lower pole right kidney, measuring approximately 1.8 x 1.5 cm
reference image 38 of series 2. Otherwise the kidneys enhance
normally. No urinary tract calculi or obstructive uropathy. Bladder
is unremarkable. Adrenals are normal.

Stomach/Bowel: Normal gas-filled appendix right lower quadrant. No
bowel obstruction or ileus. No bowel wall thickening or inflammatory
change.

Vascular/Lymphatic: Aortic atherosclerosis. No enlarged abdominal or
pelvic lymph nodes.

Reproductive: Status post hysterectomy. No adnexal masses.

Other: No abdominal wall hernia or abnormality. No abdominopelvic
ascites.

Musculoskeletal: No acute or destructive bony lesions. Reconstructed
images demonstrate no additional findings.
IMPRESSION: 1. No acute intra-abdominal or intrapelvic process.
2. Indeterminate 1.8 cm hypodensity within the lower pole right
kidney. Further evaluation with renal ultrasound is recommended to
exclude a solid renal mass.
3. Stable scattered subcentimeter hypodensities throughout the
liver, too small to characterize but likely reflecting small cysts.
4. Aortic Atherosclerosis ([id number]).

## 2021-06-21 ENCOUNTER — Other Ambulatory Visit: Payer: Self-pay | Admitting: Family Medicine

## 2021-06-26 ENCOUNTER — Other Ambulatory Visit: Payer: Self-pay | Admitting: Family Medicine

## 2021-06-26 DIAGNOSIS — Z1231 Encounter for screening mammogram for malignant neoplasm of breast: Secondary | ICD-10-CM

## 2021-06-27 ENCOUNTER — Ambulatory Visit
Admission: RE | Admit: 2021-06-27 | Discharge: 2021-06-27 | Disposition: A | Discharge status: Home / Self Care | Payer: Medicare HMO | Source: Ambulatory Visit | Attending: Family Medicine | Admitting: Family Medicine

## 2021-06-27 ENCOUNTER — Other Ambulatory Visit: Payer: Self-pay

## 2021-06-27 DIAGNOSIS — Z1231 Encounter for screening mammogram for malignant neoplasm of breast: Secondary | ICD-10-CM

## 2021-06-28 ENCOUNTER — Other Ambulatory Visit: Payer: Self-pay | Admitting: Family Medicine

## 2021-08-13 ENCOUNTER — Ambulatory Visit (HOSPITAL_COMMUNITY)
Admission: RE | Admit: 2021-08-13 | Discharge: 2021-08-13 | Disposition: A | Discharge status: Home / Self Care | Payer: Medicare HMO | Source: Ambulatory Visit | Attending: Internal Medicine | Admitting: Internal Medicine

## 2021-08-13 DIAGNOSIS — C3490 Malignant neoplasm of unspecified part of unspecified bronchus or lung: Secondary | ICD-10-CM | POA: Diagnosis not present

## 2021-08-13 LAB — POCT I-STAT CREATININE: Creatinine, Ser: 1.1 mg/dL — ABNORMAL HIGH (ref 0.44–1.00)

## 2021-08-13 MED ORDER — IOHEXOL 300 MG/ML  SOLN
75.0000 mL | Freq: Once | INTRAMUSCULAR | Status: AC | PRN
  Administered 2021-08-13: 75 mL via INTRAVENOUS

## 2021-08-30 ENCOUNTER — Other Ambulatory Visit: Payer: Self-pay | Admitting: Family Medicine

## 2021-08-30 DIAGNOSIS — R1031 Right lower quadrant pain: Secondary | ICD-10-CM

## 2021-08-31 ENCOUNTER — Telehealth: Payer: Self-pay | Admitting: Internal Medicine

## 2021-08-31 NOTE — Telephone Encounter (Signed)
.  Called patient to schedule appointment per 5/19 inbasket, patient is aware of date and time.

## 2021-09-13 ENCOUNTER — Other Ambulatory Visit: Payer: Self-pay

## 2021-09-13 ENCOUNTER — Inpatient Hospital Stay: Payer: Medicare HMO | Attending: Internal Medicine | Admitting: Internal Medicine

## 2021-09-13 VITALS — BP 118/67 | HR 63 | Temp 98.0°F | Resp 18 | Wt 185.6 lb

## 2021-09-13 DIAGNOSIS — C3411 Malignant neoplasm of upper lobe, right bronchus or lung: Secondary | ICD-10-CM | POA: Diagnosis present

## 2021-09-13 DIAGNOSIS — R0789 Other chest pain: Secondary | ICD-10-CM | POA: Diagnosis not present

## 2021-09-13 DIAGNOSIS — C3491 Malignant neoplasm of unspecified part of right bronchus or lung: Secondary | ICD-10-CM | POA: Diagnosis not present

## 2021-09-13 DIAGNOSIS — Z90710 Acquired absence of both cervix and uterus: Secondary | ICD-10-CM | POA: Insufficient documentation

## 2021-09-13 DIAGNOSIS — C3490 Malignant neoplasm of unspecified part of unspecified bronchus or lung: Secondary | ICD-10-CM | POA: Diagnosis not present

## 2021-09-13 DIAGNOSIS — Z79899 Other long term (current) drug therapy: Secondary | ICD-10-CM | POA: Insufficient documentation

## 2021-09-13 DIAGNOSIS — I10 Essential (primary) hypertension: Secondary | ICD-10-CM | POA: Insufficient documentation

## 2021-09-13 NOTE — Progress Notes (Signed)
[place] Telephone:(336) [phone]   Fax:(336) [address], [place], [place] [address]  DIAGNOSIS: Stage IIb (T1b, N1, M0) non-small cell lung cancer, adenocarcinoma presented with right upper lobe lung nodule  PRIOR THERAPY:  Status post right upper lobectomy with lymph node dissection on September 30, 2019 in [place].  She declines adjuvant systemic chemotherapy.  CURRENT THERAPY: Observation.  INTERVAL HISTORY: Brandi Holmes 71 y.o. female returns to the clinic today for 6 months follow-up visit.  The patient is feeling fine today with no concerning complaints except for the intermittent pain on the right side of the chest from the surgical scar.  She denied having any current shortness of breath, cough or hemoptysis.  She denied having any fever or chills.  She has no nausea, vomiting, diarrhea or constipation.  She has no headache or visual changes.  She had repeat CT scan of the chest performed recently and she is here for evaluation and discussion of her scan results.  MEDICAL HISTORY: Past Medical History:  Diagnosis Date   Anemia    as a child only   Coronary artery calcification of native artery    Hypertension    Liver spots    " per pt"   Lung nodule    Wears glasses    Wears partial dentures     ALLERGIES:  is allergic to crab extract allergy skin test, crab [shellfish allergy], and morphine and related.  MEDICATIONS:  Current Outpatient Medications  Medication Sig Dispense Refill   acetaminophen (TYLENOL) 500 MG tablet Take 500 mg by mouth every 6 (six) hours as needed for moderate pain or headache.      amLODipine (NORVASC) 5 MG tablet Take 1 tablet (5 mg total) by mouth every evening. (Patient taking differently: Take 5 mg by mouth at bedtime.) 90 tablet 0   aspirin EC 81 MG tablet Take 81 mg by mouth at bedtime. Swallow whole.     atorvastatin (LIPITOR) 20 MG tablet Take 1 tablet  (20 mg total) by mouth at bedtime. 90 tablet 0   calcium carbonate (OSCAL) 1500 (600 Ca) MG TABS tablet Take 600 mg of elemental calcium by mouth 3 (three) times a week.     gabapentin (NEURONTIN) 400 MG capsule Take 400 mg by mouth 3 (three) times daily as needed.     lisinopril (ZESTRIL) 20 MG tablet Take 20 mg by mouth in the morning.     Multiple Vitamins-Minerals (MULTIVITAMIN WITH MINERALS) tablet Take 1 tablet by mouth daily.     nystatin-triamcinolone ointment (MYCOLOG) Apply 1 application topically in the morning and at bedtime.     oxybutynin (DITROPAN) 5 MG tablet Take 5 mg by mouth as needed for bladder spasms.     tizanidine (ZANAFLEX) 2 MG capsule Take 2 mg by mouth at bedtime.      traMADol (ULTRAM) 50 MG tablet Take 1 tablet by mouth as needed. Every 8 hours     No current facility-administered medications for this visit.    SURGICAL HISTORY:  Past Surgical History:  Procedure Laterality Date   ABDOMINAL HYSTERECTOMY  1974   heavy bleeding   ABDOMINAL HYSTERECTOMY     BRONCHIAL BRUSHINGS  08/13/2019   Procedure: BRONCHIAL BRUSHINGS;  Surgeon: Garner Nash, DO;  Location: [place] [facility];  Service: Pulmonary;;   BRONCHIAL WASHINGS  08/13/2019   Procedure: BRONCHIAL WASHINGS;  Surgeon: Garner Nash, DO;  Location:  [place] [facility];  Service: Pulmonary;;   COLONOSCOPY W/ BIOPSIES AND POLYPECTOMY     DILATION AND CURETTAGE OF UTERUS     FINE NEEDLE ASPIRATION  08/13/2019   Procedure: FINE NEEDLE ASPIRATION (FNA) LINEAR;  Surgeon: Garner Nash, DO;  Location: [place] [facility];  Service: Pulmonary;;   FOOT SURGERY     bilateral bunions and hammer toes   LUNG BIOPSY  08/13/2019   Procedure: LUNG BIOPSY;  Surgeon: Garner Nash, DO;  Location: [place] [facility];  Service: Pulmonary;;   MULTIPLE TOOTH EXTRACTIONS     VIDEO BRONCHOSCOPY WITH ENDOBRONCHIAL NAVIGATION Right 08/13/2019   Procedure: VIDEO BRONCHOSCOPY WITH ENDOBRONCHIAL NAVIGATION;  Surgeon: Garner Nash, DO;   Location: [place];  Service: Pulmonary;  Laterality: Right;   VIDEO BRONCHOSCOPY WITH ENDOBRONCHIAL ULTRASOUND Right 08/13/2019   Procedure: VIDEO BRONCHOSCOPY WITH ENDOBRONCHIAL ULTRASOUND;  Surgeon: Garner Nash, DO;  Location: [place];  Service: Pulmonary;  Laterality: Right;    REVIEW OF SYSTEMS:  A comprehensive review of systems was negative except for: Respiratory: positive for pleurisy/chest pain   PHYSICAL EXAMINATION: General appearance: alert, cooperative, and no distress Head: Normocephalic, without obvious abnormality, atraumatic Neck: no adenopathy, no JVD, supple, symmetrical, trachea midline, and thyroid not enlarged, symmetric, no tenderness/mass/nodules Lymph nodes: Cervical, supraclavicular, and axillary nodes normal. Resp: clear to auscultation bilaterally Back: symmetric, no curvature. ROM normal. No CVA tenderness. Cardio: regular rate and rhythm, S1, S2 normal, no murmur, click, rub or gallop GI: soft, non-tender; bowel sounds normal; no masses,  no organomegaly Extremities: extremities normal, atraumatic, no cyanosis or edema  ECOG PERFORMANCE STATUS: 1 - Symptomatic but completely ambulatory  Blood pressure 118/67, pulse 63, temperature 98 F (36.7 C), temperature source Oral, resp. rate 18, weight 185 lb 9 oz (84.2 kg), SpO2 100 %.  LABORATORY DATA: Lab Results  Component Value Date   WBC 9.0 02/14/2021   HGB 13.4 02/14/2021   HCT 42.0 02/14/2021   MCV 90.1 02/14/2021   PLT 310 02/14/2021      Chemistry      Component Value Date/Time   NA 141 02/14/2021 1528   NA 142 07/22/2019 1522   K 4.2 02/14/2021 1528   CL 109 02/14/2021 1528   CO2 23 02/14/2021 1528   BUN 16 02/14/2021 1528   BUN 13 07/22/2019 1522   CREATININE 1.10 (H) 08/13/2021 1600   CREATININE 0.96 02/14/2021 1528   CREATININE 0.91 09/16/2011 1000      Component Value Date/Time   CALCIUM 9.4 02/14/2021 1528   ALKPHOS 81 02/14/2021 1528   AST 21 02/14/2021 1528   ALT  16 02/14/2021 1528   BILITOT 0.7 02/14/2021 1528       RADIOGRAPHIC STUDIES: No results found.   ASSESSMENT AND PLAN: This is a pleasant 71 years old African-American female diagnosed with a stage IIb (T1b, N1, M0) non-small cell lung cancer, adenocarcinoma status post a right upper lobectomy with lymph node dissection on September 30, 2019 in [place].  She declined adjuvant systemic chemotherapy at that time. She is currently on observation and feeling fine with no concerning complaints except for the intermittent pain on the right side of the chest. She had repeat CT scan of the chest performed recently.  I personally and independently reviewed the scan and discussed the results with the patient today. Her scan showed no concerning findings for disease recurrence or metastasis. I recommended for the patient to continue on observation with repeat CT scan of the chest in 1 year. For  the right-sided intermittent chest pain, she will continue her current treatment with tramadol and gabapentin. The patient was advised to call immediately if she has any other concerning symptoms in the interval. The patient voices understanding of current disease status and treatment options and is in agreement with the current care plan.  All questions were answered. The patient knows to call the clinic with any problems, questions or concerns. We can certainly see the patient much sooner if necessary.  Disclaimer: This note was dictated with voice recognition software. Similar sounding words can inadvertently be transcribed and may not be corrected upon review.

## 2021-09-25 ENCOUNTER — Inpatient Hospital Stay: Admission: RE | Admit: 2021-09-25 | Payer: Medicare HMO | Source: Ambulatory Visit

## 2021-10-04 ENCOUNTER — Ambulatory Visit
Admission: RE | Admit: 2021-10-04 | Discharge: 2021-10-04 | Disposition: A | Discharge status: Home / Self Care | Payer: Medicare HMO | Source: Ambulatory Visit | Attending: Family Medicine | Admitting: Family Medicine

## 2021-10-04 DIAGNOSIS — R1031 Right lower quadrant pain: Secondary | ICD-10-CM

## 2021-10-04 MED ORDER — IOPAMIDOL (ISOVUE-300) INJECTION 61%
100.0000 mL | Freq: Once | INTRAVENOUS | Status: AC | PRN
  Administered 2021-10-04: 100 mL via INTRAVENOUS

## 2021-10-09 ENCOUNTER — Ambulatory Visit: Payer: Medicare HMO | Admitting: Student

## 2021-10-09 ENCOUNTER — Encounter: Payer: Self-pay | Admitting: Student

## 2021-10-09 VITALS — BP 144/59 | HR 76 | Temp 97.5°F | Resp 14 | Ht 66.0 in | Wt 189.2 lb

## 2021-10-09 DIAGNOSIS — I2510 Atherosclerotic heart disease of native coronary artery without angina pectoris: Secondary | ICD-10-CM

## 2021-10-09 DIAGNOSIS — I10 Essential (primary) hypertension: Secondary | ICD-10-CM

## 2021-10-09 NOTE — Progress Notes (Signed)
Brandi Holmes Date of Birth: [birth year] MRN: [id number] Primary Care Provider:Reese, Jocelyn Lamer, MD Primary Cardiologist: Rayford Halsted, PA-C (established care 07/15/2019)  Date: 10/09/21 Last Office Visit: 08/22/2020  Chief Complaint  Patient presents with   Follow-up    1 year   coronary artery calcification    HPI  Brandi Holmes is a 71 y.o. female who presents to the office with a chief complaint of " follow-up for management of coronary artery calcification and review test results." Patient's past medical history and cardiac risk factors include: Hypertension, former smoker, postmenopausal female, advanced age, obesity.  Patient was referred to the office for management of blood pressure at the request of her PCP.  Patient was last seen in the office 10/09/20 at which time blood pressure was well controlled and patient complained of lower extremity swelling, therefore reduced amlodipine from 10 mg to 5 mg. At last visit also encouraged patient to continue aspirin and statin therapy given coronary artery calcification.   Patient now presents for 1 year follow up.  Patient's bilateral lower extremity swelling has completely resolved since reducing amlodipine.  Blood pressure is mildly elevated in the office, however on home readings and at other recent provider visits blood pressure is under excellent control. Patient is without cardiovascular complaints at this time, primary concern is abdominal pain for which she is being evaluated by PCP.  She denies any lightheadedness, dizziness, near-syncope or syncope, chest pain, leg edema.  History of right-sided lung cancer and underwent lobectomy with lymph node excision, follows closely with oncology and pulmonology locally.   No premature coronary artery disease in the family.    FUNCTIONAL STATUS: No structured exercise program or daily routine.    ALLERGIES: Allergies  Allergen Reactions   Crab Extract Allergy  Skin Test Swelling    Swelling of feet   Crab [Shellfish Allergy] Swelling    Swelling of feet   Morphine And Related     Bad dreams     MEDICATION LIST PRIOR TO VISIT: Current Outpatient Medications on File Prior to Visit  Medication Sig Dispense Refill   acetaminophen (TYLENOL) 500 MG tablet Take 500 mg by mouth every 6 (six) hours as needed for moderate pain or headache.      amLODipine (NORVASC) 5 MG tablet Take 1 tablet (5 mg total) by mouth every evening. (Patient taking differently: Take 5 mg by mouth at bedtime.) 90 tablet 0   aspirin EC 81 MG tablet Take 81 mg by mouth at bedtime. Swallow whole.     atorvastatin (LIPITOR) 20 MG tablet Take 1 tablet (20 mg total) by mouth at bedtime. 90 tablet 0   calcium carbonate (OSCAL) 1500 (600 Ca) MG TABS tablet Take 600 mg of elemental calcium by mouth 3 (three) times a week.     clobetasol ointment (TEMOVATE) 0.05 % Apply 1 application. topically 2 (two) times daily.     gabapentin (NEURONTIN) 600 MG tablet Take 600 mg by mouth 3 (three) times daily.     lisinopril (ZESTRIL) 20 MG tablet Take 20 mg by mouth in the morning.     Multiple Vitamins-Minerals (MULTIVITAMIN WITH MINERALS) tablet Take 1 tablet by mouth daily.     nystatin-triamcinolone ointment (MYCOLOG) Apply 1 application topically in the morning and at bedtime.     oxybutynin (DITROPAN) 5 MG tablet Take 5 mg by mouth as needed for bladder spasms.     tiZANidine (ZANAFLEX) 2 MG tablet Take 2 mg by mouth 2 (two) times  daily.     traMADol (ULTRAM) 50 MG tablet Take 1 tablet by mouth as needed. Every 8 hours     No current facility-administered medications on file prior to visit.    PAST MEDICAL HISTORY: Past Medical History:  Diagnosis Date   Anemia    as a child only   Coronary artery calcification of native artery    Hyperlipidemia    Hypertension    Liver spots    " per pt"   Lung nodule    Wears glasses    Wears partial dentures     PAST SURGICAL HISTORY: Past  Surgical History:  Procedure Laterality Date   ABDOMINAL HYSTERECTOMY  1974   heavy bleeding   ABDOMINAL HYSTERECTOMY     BRONCHIAL BRUSHINGS  08/13/2019   Procedure: BRONCHIAL BRUSHINGS;  Surgeon: Josephine Igo, DO;  Location: [facility];  Service: Pulmonary;;   BRONCHIAL WASHINGS  08/13/2019   Procedure: BRONCHIAL WASHINGS;  Surgeon: Josephine Igo, DO;  Location: [facility];  Service: Pulmonary;;   COLONOSCOPY W/ BIOPSIES AND POLYPECTOMY     DILATION AND CURETTAGE OF UTERUS     FINE NEEDLE ASPIRATION  08/13/2019   Procedure: FINE NEEDLE ASPIRATION (FNA) LINEAR;  Surgeon: Josephine Igo, DO;  Location: [facility];  Service: Pulmonary;;   FOOT SURGERY     bilateral bunions and hammer toes   LUNG BIOPSY  08/13/2019   Procedure: LUNG BIOPSY;  Surgeon: Josephine Igo, DO;  Location: [facility];  Service: Pulmonary;;   MULTIPLE TOOTH EXTRACTIONS     VIDEO BRONCHOSCOPY WITH ENDOBRONCHIAL NAVIGATION Right 08/13/2019   Procedure: VIDEO BRONCHOSCOPY WITH ENDOBRONCHIAL NAVIGATION;  Surgeon: Josephine Igo, DO;  Location: [facility];  Service: Pulmonary;  Laterality: Right;   VIDEO BRONCHOSCOPY WITH ENDOBRONCHIAL ULTRASOUND Right 08/13/2019   Procedure: VIDEO BRONCHOSCOPY WITH ENDOBRONCHIAL ULTRASOUND;  Surgeon: Josephine Igo, DO;  Location: [facility];  Service: Pulmonary;  Laterality: Right;    FAMILY HISTORY: The patient family history includes Cancer in her paternal aunt; Heart disease (age of onset: 53) in her mother; Hypertension in her mother; Pneumonia in her brother.   SOCIAL HISTORY:  The patient  reports that she quit smoking about 7 years ago. Her smoking use included cigarettes. She has a 15.00 pack-year smoking history. She has never used smokeless tobacco. She reports that she does not currently use alcohol. She reports that she does not currently use drugs.  Review of Systems  Constitutional: Negative for chills and fever.  HENT:  Negative for hoarse voice  and nosebleeds.   Eyes:  Negative for discharge, double vision and pain.  Cardiovascular:  Negative for chest pain, claudication, dyspnea on exertion, leg swelling, near-syncope, orthopnea, palpitations, paroxysmal nocturnal dyspnea and syncope.  Respiratory:  Negative for hemoptysis and shortness of breath.   Musculoskeletal:  Negative for muscle cramps and myalgias.  Gastrointestinal:  Negative for abdominal pain, constipation, diarrhea, hematemesis, hematochezia, melena, nausea and vomiting.  Neurological:  Negative for dizziness and light-headedness.   PHYSICAL EXAM:    10/09/2021    2:19 PM 10/09/2021    2:03 PM 09/13/2021   12:58 PM  Vitals with BMI  Height  5\' 6"    Weight  189 lbs 3 oz 185 lbs 9 oz  BMI  30.55   Systolic 144 151 161  Diastolic 59 77 67  Pulse 76 86 63   CONSTITUTIONAL: Well-developed and well-nourished. No acute distress.  SKIN: Skin is warm and dry. No rash noted. No cyanosis. No  pallor. No jaundice HEAD: Normocephalic and atraumatic.  EYES: No scleral icterus MOUTH/THROAT: Moist oral membranes.  NECK: No JVD present. No thyromegaly noted. No carotid bruits  LYMPHATIC: No visible cervical adenopathy.  CHEST Normal respiratory effort. No intercostal retractions  LUNGS: Clear to auscultation bilaterally.  No stridor. No wheezes. No rales.  CARDIOVASCULAR: Regular rate and rhythm, positive S1-S2, no murmurs rubs or gallops appreciated ABDOMINAL: No apparent ascites.  EXTREMITIES: No peripheral edema  HEMATOLOGIC: No significant bruising NEUROLOGIC: Oriented to person, place, and time. Nonfocal. Normal muscle tone.  PSYCHIATRIC: Normal mood and affect. Normal behavior. Cooperative Physical exam unchanged compared to previous office visit.   RADIOLOGY: CT chest without contrast 02/24/2020: 1. Surgical changes from a right upper lobe wedge resection. No findings suspicious for recurrent tumor. 2. Right paramediastinal subpleural atelectasis and mild  bronchiectasis. 3. Slight interval enlargement of precarinal lymph nodes. Recommend continued surveillance. 4. Stable emphysematous changes. 5. 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 6. Stable three-vessel coronary artery calcifications. 7. Emphysema and aortic atherosclerosis. Aortic Atherosclerosis (ICD10-I70.0) and Emphysema (ICD10-J43.9).  CARDIAC DATABASE: EKG: 08/22/2020: Sinus bradycardia, 55 bpm, normal axis, without underlying injury pattern. 10/09/2021: Normal sinus rhythm, 67 bpm, without underlying ischemia or injury pattern.  Echocardiogram: 07/27/2019: LVEF 55%, grade 1 diastolic impairment, mild LVH, mild TR, RVSP 26 mmHg.  Stress Testing:  Lexiscan Tetrofosmin stress test 10/02/2020: Exercise nuclear stress test was performed using Bruce protocol. Patient reached 6.2 METS, and 82% of age predicted maximum heart rate. Exercise capacity was low. No chest pain reported. Heart rate and hemodynamic response were normal. Peak EKG demonstrated sinus tachycardia, <42mm upsloping ST depressions in leads II, III, aVF, which is negative for ischemia. Normal wall motion and thickening. Stress LVEF 78%. SPECT images showed small sized, mild intensity, fixed defect in apical anterolateral myocardium. In absence of significant wall motion abnormality, this likely represents tissue attenuation artifact. Low risk study.  Heart Catheterization: None  LABORATORY DATA:    Latest Ref Rng & Units 02/14/2021    3:28 PM 08/10/2020   10:45 AM 02/28/2020    2:01 PM  CBC  WBC 4.0 - 10.5 K/uL 9.0  8.6  8.4   Hemoglobin 12.0 - 15.0 g/dL 16.1  09.6  04.5   Hematocrit 36.0 - 46.0 % 42.0  43.2  42.7   Platelets 150 - 400 K/uL 310  323  193        Latest Ref Rng & Units 08/13/2021    4:00 PM 02/14/2021    3:28 PM 08/10/2020   10:45 AM  CMP  Glucose 70 - 99 mg/dL  91  90   BUN 8 - 23 mg/dL  16  21   Creatinine 4.09 - 1.00 mg/dL 8.11   9.14  7.82   Sodium 135 - 145 mmol/L  141  140   Potassium 3.5 - 5.1 mmol/L  4.2  4.4   Chloride 98 - 111 mmol/L  109  106   CO2 22 - 32 mmol/L  23  25   Calcium 8.9 - 10.3 mg/dL  9.4  9.5   Total Protein 6.5 - 8.1 g/dL  7.7  7.9   Total Bilirubin 0.3 - 1.2 mg/dL  0.7  0.5   Alkaline Phos 38 - 126 U/L  81  71   AST 15 - 41 U/L  21  17   ALT 0 - 44 U/L  16  11     Lipid Panel  Component Value Date/Time   CHOL 175 08/22/2020 1557   TRIG 74 08/22/2020 1557   HDL 55 08/22/2020 1557   CHOLHDL 3.7 09/16/2011 1000   VLDL 17 09/16/2011 1000   LDLCALC 106 (H) 08/22/2020 1557   LDLDIRECT 99 08/22/2020 1557   LABVLDL 14 08/22/2020 1557    No results found for: "HGBA1C" No components found for: "NTPROBNP" No results found for: "TSH"   Lipid profile: Collected: 10/02/2019 at [facility] Total cholesterol 135, triglycerides 178, HDL 41, LDL 70.   FINAL MEDICATION LIST END OF ENCOUNTER: No orders of the defined types were placed in this encounter.   Medications Discontinued During This Encounter  Medication Reason   tizanidine (ZANAFLEX) 2 MG capsule      Current Outpatient Medications:    acetaminophen (TYLENOL) 500 MG tablet, Take 500 mg by mouth every 6 (six) hours as needed for moderate pain or headache. , Disp: , Rfl:    amLODipine (NORVASC) 5 MG tablet, Take 1 tablet (5 mg total) by mouth every evening. (Patient taking differently: Take 5 mg by mouth at bedtime.), Disp: 90 tablet, Rfl: 0   aspirin EC 81 MG tablet, Take 81 mg by mouth at bedtime. Swallow whole., Disp: , Rfl:    atorvastatin (LIPITOR) 20 MG tablet, Take 1 tablet (20 mg total) by mouth at bedtime., Disp: 90 tablet, Rfl: 0   calcium carbonate (OSCAL) 1500 (600 Ca) MG TABS tablet, Take 600 mg of elemental calcium by mouth 3 (three) times a week., Disp: , Rfl:    clobetasol ointment (TEMOVATE) 0.05 %, Apply 1 application. topically 2 (two) times daily., Disp: , Rfl:    gabapentin (NEURONTIN) 600 MG tablet,  Take 600 mg by mouth 3 (three) times daily., Disp: , Rfl:    lisinopril (ZESTRIL) 20 MG tablet, Take 20 mg by mouth in the morning., Disp: , Rfl:    Multiple Vitamins-Minerals (MULTIVITAMIN WITH MINERALS) tablet, Take 1 tablet by mouth daily., Disp: , Rfl:    nystatin-triamcinolone ointment (MYCOLOG), Apply 1 application topically in the morning and at bedtime., Disp: , Rfl:    oxybutynin (DITROPAN) 5 MG tablet, Take 5 mg by mouth as needed for bladder spasms., Disp: , Rfl:    tiZANidine (ZANAFLEX) 2 MG tablet, Take 2 mg by mouth 2 (two) times daily., Disp: , Rfl:    traMADol (ULTRAM) 50 MG tablet, Take 1 tablet by mouth as needed. Every 8 hours, Disp: , Rfl:   IMPRESSION:    ICD-10-CM   1. Coronary artery calcification of native artery  I25.10 EKG 12-Lead   I25.84     2. Essential hypertension  I10        RECOMMENDATIONS: Brandi Holmes is a 71 y.o. female whose past medical history and cardiac risk factors include: Hypertension, coronary artery calcification, former smoker, postmenopausal female, advanced age, obesity.  Benign essential hypertension: Although blood pressure is mildly elevated in the office today home readings are well controlled.  I also reviewed records from external provider offices and blood pressure is under excellent control on these checks. Continue amlodipine 5 mg p.o. daily, continue lisinopril 20 mg daily Encouraged her to continue to follow DASH diet  Coronary artery calcification: Noted on not gated CT study. Initially planned for coronary calcium score; however, it was cost prohibitive to the patient.  Patient underwent overall low risk stress test. She remains asymptomatic, no additional diagnostic testing warranted at this time. Continue aspirin and statin therapy.   Will defer lipid profile testing  to PCP   Former smoker: Continues to refrain from tobacco use.  Orders Placed This Encounter  Procedures   EKG 12-Lead   --Continue cardiac  medications as reconciled in final medication list. --Return in about 1 year (around 10/10/2022) for HTN, HLD, coronary calcification . Or sooner if needed. --Continue follow-up with your primary care physician regarding the management of your other chronic comorbid conditions.  Patient's questions and concerns were addressed to her satisfaction. She voices understanding of the instructions provided during this encounter.   This note was created using a voice recognition software as a result there may be grammatical errors inadvertently enclosed that do not reflect the nature of this encounter. Every attempt is made to correct such errors.   Rayford Halsted, PA-C 10/09/2021, 3:41 PM Office: [phone]

## 2021-11-16 ENCOUNTER — Other Ambulatory Visit: Payer: Self-pay | Admitting: Family Medicine

## 2021-11-16 ENCOUNTER — Other Ambulatory Visit: Payer: Self-pay | Admitting: Cardiology

## 2021-11-16 DIAGNOSIS — D49511 Neoplasm of unspecified behavior of right kidney: Secondary | ICD-10-CM

## 2021-11-19 NOTE — Progress Notes (Signed)
External Labs: Collected: 06/04/2021 AST 15, ALT 11, alkaline phosphatase 71. Hemoglobin 12.7, hematocrit 37.9%. Sodium 141, potassium 4.6, chloride 105, bicarb 27. BUN 12, creatinine 0.86 eGFR 71. Total cholesterol 114, triglycerides 71, LDL a 41, HDL 58, non-HDL 56. TSH 0.58

## 2021-11-22 ENCOUNTER — Other Ambulatory Visit: Payer: Self-pay | Admitting: Family Medicine

## 2021-11-22 DIAGNOSIS — D49511 Neoplasm of unspecified behavior of right kidney: Secondary | ICD-10-CM

## 2021-11-26 ENCOUNTER — Encounter: Payer: Self-pay | Admitting: Internal Medicine

## 2021-12-02 ENCOUNTER — Ambulatory Visit
Admission: RE | Admit: 2021-12-02 | Discharge: 2021-12-02 | Disposition: A | Discharge status: Home / Self Care | Payer: Medicare HMO | Source: Ambulatory Visit | Attending: Family Medicine | Admitting: Family Medicine

## 2021-12-02 DIAGNOSIS — D49511 Neoplasm of unspecified behavior of right kidney: Secondary | ICD-10-CM

## 2021-12-02 MED ORDER — GADOBENATE DIMEGLUMINE 529 MG/ML IV SOLN
17.0000 mL | Freq: Once | INTRAVENOUS | Status: AC | PRN
  Administered 2021-12-02: 17 mL via INTRAVENOUS

## 2022-01-18 ENCOUNTER — Telehealth: Payer: Self-pay | Admitting: Pulmonary Disease

## 2022-01-18 NOTE — Telephone Encounter (Signed)
Pt states she found black water and what appeared to be black mold in [facility] unit. Pt states she has increased lung pain, increased fatigue and dry cough. Pt wants to be Covid and Flu tested. Pt advised to seek care at [facility] as they would also be able to treat her if needed. Pt stated understanding and that she would do so. Nothing further needed at this time.

## 2022-02-13 ENCOUNTER — Ambulatory Visit: Payer: Medicare HMO | Admitting: Pulmonary Disease

## 2022-02-20 ENCOUNTER — Telehealth: Payer: Self-pay

## 2022-02-22 ENCOUNTER — Ambulatory Visit: Payer: Medicare HMO | Admitting: Pulmonary Disease

## 2022-02-22 ENCOUNTER — Encounter: Payer: Self-pay | Admitting: Adult Health

## 2022-02-22 ENCOUNTER — Ambulatory Visit (INDEPENDENT_AMBULATORY_CARE_PROVIDER_SITE_OTHER): Payer: Medicare HMO | Admitting: Adult Health

## 2022-02-22 VITALS — BP 140/80 | HR 78 | Temp 98.2°F | Ht 66.0 in | Wt 184.0 lb

## 2022-02-22 DIAGNOSIS — R0781 Pleurodynia: Secondary | ICD-10-CM | POA: Diagnosis not present

## 2022-02-22 DIAGNOSIS — C3491 Malignant neoplasm of unspecified part of right bronchus or lung: Secondary | ICD-10-CM | POA: Diagnosis not present

## 2022-02-22 DIAGNOSIS — R0609 Other forms of dyspnea: Secondary | ICD-10-CM | POA: Diagnosis not present

## 2022-02-22 DIAGNOSIS — R1031 Right lower quadrant pain: Secondary | ICD-10-CM

## 2022-02-22 LAB — CBC WITH DIFFERENTIAL/PLATELET
Basophils Absolute: 0.1 10*3/uL (ref 0.0–0.1)
Basophils Relative: 0.6 % (ref 0.0–3.0)
Eosinophils Absolute: 0.5 10*3/uL (ref 0.0–0.7)
Eosinophils Relative: 5.2 % — ABNORMAL HIGH (ref 0.0–5.0)
HCT: 38.1 % (ref 36.0–46.0)
Hemoglobin: 12.5 g/dL (ref 12.0–15.0)
Lymphocytes Relative: 26.8 % (ref 12.0–46.0)
Lymphs Abs: 2.6 10*3/uL (ref 0.7–4.0)
MCHC: 32.8 g/dL (ref 30.0–36.0)
MCV: 88.5 fl (ref 78.0–100.0)
Monocytes Absolute: 0.8 10*3/uL (ref 0.1–1.0)
Monocytes Relative: 8 % (ref 3.0–12.0)
Neutro Abs: 5.7 10*3/uL (ref 1.4–7.7)
Neutrophils Relative %: 59.4 % (ref 43.0–77.0)
Platelets: 315 10*3/uL (ref 150.0–400.0)
RBC: 4.3 Mil/uL (ref 3.87–5.11)
RDW: 13.6 % (ref 11.5–15.5)
WBC: 9.6 10*3/uL (ref 4.0–10.5)

## 2022-02-22 LAB — COMPREHENSIVE METABOLIC PANEL
ALT: 12 U/L (ref 0–35)
AST: 19 U/L (ref 0–37)
Albumin: 4.3 g/dL (ref 3.5–5.2)
Alkaline Phosphatase: 98 U/L (ref 39–117)
BUN: 13 mg/dL (ref 6–23)
CO2: 28 mEq/L (ref 19–32)
Calcium: 9.6 mg/dL (ref 8.4–10.5)
Chloride: 105 mEq/L (ref 96–112)
Creatinine, Ser: 0.89 mg/dL (ref 0.40–1.20)
GFR: 64.14 mL/min (ref >60.00)
Glucose, Bld: 97 mg/dL (ref 70–99)
Potassium: 4.3 mEq/L (ref 3.5–5.1)
Sodium: 138 mEq/L (ref 135–145)
Total Bilirubin: 0.9 mg/dL (ref 0.2–1.2)
Total Protein: 7.6 g/dL (ref 6.0–8.3)

## 2022-02-22 NOTE — Assessment & Plan Note (Signed)
Back and rib pain.  Recent MRI shows sclerotic lesions along T9 and L5 suspicious for metastasis. PET scan is pending.  Continue with pain control

## 2022-02-22 NOTE — Progress Notes (Signed)
@Patient  ID: Alba Destine, female    DOB: 1950-11-08, 71 y.o.   MRN: [id number]  Chief Complaint  Patient presents with   Acute Visit    Referring provider: Lin Landsman, MD  HPI: 71 year old female former smoker followed for lung cancer. Underwent navigational bronchoscopy with tissue diagnosis of upper lobe lung mass that was positive for adenocarcinoma.  Seen by thoracic surgeon in [facility].  Underwent right upper lobectomy (diagnosed with TIIb, N1M0 malignancy with +3 nodes) declined  adjunctive chemotherapy  TEST/EVENTS :  CT chest Aug 13, 2021 status post right upper lobectomy no evidence of recurrent or metastatic disease unchanged pretracheal lymph nodes, mild emphysema  02/22/2022 Follow up : Lung cancer  Patient presents for an acute visit.  Patient was previously seen for a right upper lobe lung mass.  She underwent navigational bronchoscopy with tissue diagnosis for adenocarcinoma.  She had a right upper lobectomy with lymph node dissection on September 30, 2019 in [place].  She declined adjunctive systemic chemotherapy. Patient complains of significant pain over the last several months.  She complains of right rib and back pain.  She says overall breathing is doing okay.  She has some shortness of breath but no significant cough or dyspnea.  No wheezing.  She complains that she has had no energy.  Patient also has been having abdominal and groin pain. Chart review shows CT abdomen and pelvis October 04, 2021 showed right lower renal mass measuring 2.4 cm slightly increased in size.  And sclerotic lesion L5 suspicious for metastatic disease.  She was set up for a MRI abdomen and pelvis.  This showed a 2.4 cm right renal mass concerning for malignancy, enhancing bone lesions along the right T9 and L5 suspicious for bone metastasis.  And a stable 0.4 cm pulmonary nodule.  Surveillance CT chest Aug 13, 2021 showed right upper lobectomy with no evidence of recurrent or  metastatic disease in the chest.  Unchanged subcentimeter pretracheal lymph nodes.  With mild emphysema.  She denies any hemoptysis, unintentional weight loss.  She is taking tramadol and Zanaflex for pain control.    Allergies  Allergen Reactions   Crab Extract Allergy Skin Test Swelling    Swelling of feet   Crab [Shellfish Allergy] Swelling    Swelling of feet   Morphine And Related     Bad dreams    Immunization History  Administered Date(s) Administered   Moderna Sars-Covid-2 Vaccination 07/15/2019, 08/09/2019    Past Medical History:  Diagnosis Date   Anemia    as a child only   Coronary artery calcification of native artery    Hyperlipidemia    Hypertension    Liver spots    " per pt"   Lung nodule    Wears glasses    Wears partial dentures     Tobacco History: Social History   Tobacco Use  Smoking Status Former   Packs/day: 0.30   Years: 50.00   Total pack years: 15.00   Types: Cigarettes   Quit date: 2016   Years since quitting: 7.8  Smokeless Tobacco Never   Counseling given: Not Answered   Outpatient Medications Prior to Visit  Medication Sig Dispense Refill   aspirin EC 81 MG tablet Take 81 mg by mouth at bedtime. Swallow whole.     calcium carbonate (OSCAL) 1500 (600 Ca) MG TABS tablet Take 600 mg of elemental calcium by mouth 3 (three) times a week.     clobetasol ointment (TEMOVATE)  5.85 % Apply 1 application. topically 2 (two) times daily.     gabapentin (NEURONTIN) 600 MG tablet Take 600 mg by mouth 3 (three) times daily.     lisinopril (ZESTRIL) 20 MG tablet Take 20 mg by mouth in the morning.     Multiple Vitamins-Minerals (MULTIVITAMIN WITH MINERALS) tablet Take 1 tablet by mouth daily.     nystatin-triamcinolone ointment (MYCOLOG) Apply 1 application topically in the morning and at bedtime.     oxybutynin (DITROPAN) 5 MG tablet Take 5 mg by mouth as needed for bladder spasms.     tiZANidine (ZANAFLEX) 2 MG tablet Take 2 mg by mouth 2  (two) times daily.     traMADol (ULTRAM) 50 MG tablet Take 1 tablet by mouth as needed. Every 8 hours     amLODipine (NORVASC) 5 MG tablet Take 1 tablet (5 mg total) by mouth every evening. (Patient taking differently: Take 5 mg by mouth at bedtime.) 90 tablet 0   atorvastatin (LIPITOR) 20 MG tablet Take 1 tablet (20 mg total) by mouth at bedtime. 90 tablet 0   acetaminophen (TYLENOL) 500 MG tablet Take 500 mg by mouth every 6 (six) hours as needed for moderate pain or headache.  (Patient not taking: Reported on 02/22/2022)     No facility-administered medications prior to visit.     Review of Systems:   Constitutional:   No  weight loss, night sweats,  Fevers, chills, + fatigue, or  lassitude.  HEENT:   No headaches,  Difficulty swallowing,  Tooth/dental problems, or  Sore throat,                No sneezing, itching, ear ache, nasal congestion, post nasal drip,   CV:  No chest pain,  Orthopnea, PND, swelling in lower extremities, anasarca, dizziness, palpitations, syncope.   GI  No heartburn, indigestion, abdominal pain, nausea, vomiting, diarrhea, change in bowel habits, loss of appetite, bloody stools.   Resp: .  No chest wall deformity  Skin: no rash or lesions.  GU: no dysuria, change in color of urine, no urgency or frequency.  No flank pain, no hematuria   MS: Positive back pain   Physical Exam  BP (!) 140/80 (BP Location: Left Arm, Patient Position: Sitting, Cuff Size: Large)   Pulse 78   Temp 98.2 F (36.8 C) (Oral)   Ht 5\' 6"  (1.676 m)   Wt 184 lb (83.5 kg)   SpO2 97%   BMI 29.70 kg/m   GEN: A/Ox3; pleasant , NAD, well nourished    HEENT:  [place]/AT,  EACs-clear, TMs-wnl, NOSE-clear, THROAT-clear, no lesions, no postnasal drip or exudate noted.   NECK:  Supple w/ fair ROM; no JVD; normal carotid impulses w/o bruits; no thyromegaly or nodules palpated; no lymphadenopathy.    RESP  Clear  P & A; w/o, wheezes/ rales/ or rhonchi. no accessory muscle use, no dullness  to percussion  CARD:  RRR, no m/r/g, tr  peripheral edema, pulses intact, no cyanosis or clubbing.  GI:   Soft & nt; nml bowel sounds; no organomegaly or masses detected.   Musco: Warm bil, no deformities or joint swelling noted.   Neuro: alert, no focal deficits noted.    Skin: Warm, no lesions or rashes    Lab Results:      BNP No results found for: "BNP"  ProBNP No results found for: "PROBNP"  Imaging: No results found.        No data to display  No results found for: "NITRICOXIDE"      Assessment & Plan:   Adenocarcinoma of right lung, stage 2 (HCC) Known history of lung cancer status post right upper lobectomy-patient with abnormal MRI abdomen and pelvis with right renal mass and possible sclerotic lesion suspicious for metastatic disease.  Patient will be set up for PET scan. Labs pending today.  Plan  Patient Instructions  Set up for PET scan  Warm heat to ribs/chest wall .  Labs today.  Pain meds as directed  Delsym 2 tsp Twice daily  As needed  cough/congestion  Follow up with in 2 weeks with Dr. Valeta Harms or Kinda Pottle NP  Please contact office for sooner follow up if symptoms do not improve or worsen or seek emergency care        Rib pain Back and rib pain.  Recent MRI shows sclerotic lesions along T9 and L5 suspicious for metastasis. PET scan is pending.  Continue with pain control     Rexene Edison, NP 02/22/2022

## 2022-02-22 NOTE — Patient Instructions (Addendum)
Set up for PET scan  Warm heat to ribs/chest wall .  Labs today.  Pain meds as directed  Delsym 2 tsp Twice daily  As needed  cough/congestion  Follow up with in 2 weeks with Dr. Valeta Harms or Anthonyjames Bargar NP  Please contact office for sooner follow up if symptoms do not improve or worsen or seek emergency care

## 2022-02-22 NOTE — Assessment & Plan Note (Signed)
Known history of lung cancer status post right upper lobectomy-patient with abnormal MRI abdomen and pelvis with right renal mass and possible sclerotic lesion suspicious for metastatic disease.  Patient will be set up for PET scan. Labs pending today.  Plan  Patient Instructions  Set up for PET scan  Warm heat to ribs/chest wall .  Labs today.  Pain meds as directed  Delsym 2 tsp Twice daily  As needed  cough/congestion  Follow up with in 2 weeks with Dr. Valeta Harms or Kayzlee Wirtanen NP  Please contact office for sooner follow up if symptoms do not improve or worsen or seek emergency care

## 2022-02-27 ENCOUNTER — Telehealth: Payer: Self-pay | Admitting: Adult Health

## 2022-02-27 NOTE — Telephone Encounter (Signed)
Called and spoke with pt stating to her that she had labwork done at last OV and that she did not need to go anywhere to get any other labwork done. Pt verbalized understanding. Nothing further needed.

## 2022-02-27 NOTE — Telephone Encounter (Signed)
Patient would like the nurse to call her regarding some lab orders that she is supposed to get at [facility].  Patient stated she is confused by the instructions on her AVS from her last visit and needs the nurse to give her some clarification.  Please call patient back at [phone]

## 2022-03-01 ENCOUNTER — Telehealth: Payer: Self-pay | Admitting: *Deleted

## 2022-03-01 NOTE — Telephone Encounter (Signed)
Called and spoke with patient, advised time/date of PET scan as well as instructions.  Advised that we needed to reschedule her f/u from 11/20 until after her PET scan on 11/27.  Scheduled on 03/18/22 at 2:00 pm, advised to arrive by 1:45 pm for check in.  She verbalized understanding.  Nothing further needed

## 2022-03-04 ENCOUNTER — Ambulatory Visit: Payer: Medicare HMO | Admitting: Adult Health

## 2022-03-06 ENCOUNTER — Observation Stay (HOSPITAL_COMMUNITY): Payer: Medicare HMO

## 2022-03-06 ENCOUNTER — Emergency Department (HOSPITAL_COMMUNITY): Payer: Medicare HMO

## 2022-03-06 ENCOUNTER — Inpatient Hospital Stay (HOSPITAL_COMMUNITY)
Admission: EM | Admit: 2022-03-06 | Discharge: 2022-03-16 | DRG: 478 | Disposition: A | Discharge status: Home / Self Care | Payer: Medicare HMO | Attending: Internal Medicine | Admitting: Internal Medicine

## 2022-03-06 ENCOUNTER — Other Ambulatory Visit: Payer: Self-pay

## 2022-03-06 DIAGNOSIS — G893 Neoplasm related pain (acute) (chronic): Secondary | ICD-10-CM | POA: Diagnosis present

## 2022-03-06 DIAGNOSIS — Z8249 Family history of ischemic heart disease and other diseases of the circulatory system: Secondary | ICD-10-CM

## 2022-03-06 DIAGNOSIS — Z91013 Allergy to seafood: Secondary | ICD-10-CM

## 2022-03-06 DIAGNOSIS — I10 Essential (primary) hypertension: Secondary | ICD-10-CM | POA: Diagnosis present

## 2022-03-06 DIAGNOSIS — C801 Malignant (primary) neoplasm, unspecified: Secondary | ICD-10-CM

## 2022-03-06 DIAGNOSIS — D72829 Elevated white blood cell count, unspecified: Secondary | ICD-10-CM | POA: Diagnosis present

## 2022-03-06 DIAGNOSIS — Z79899 Other long term (current) drug therapy: Secondary | ICD-10-CM

## 2022-03-06 DIAGNOSIS — R8271 Bacteriuria: Secondary | ICD-10-CM | POA: Diagnosis present

## 2022-03-06 DIAGNOSIS — Z902 Acquired absence of lung [part of]: Secondary | ICD-10-CM

## 2022-03-06 DIAGNOSIS — Z809 Family history of malignant neoplasm, unspecified: Secondary | ICD-10-CM

## 2022-03-06 DIAGNOSIS — C7951 Secondary malignant neoplasm of bone: Secondary | ICD-10-CM | POA: Diagnosis not present

## 2022-03-06 DIAGNOSIS — K5900 Constipation, unspecified: Secondary | ICD-10-CM | POA: Diagnosis present

## 2022-03-06 DIAGNOSIS — R0781 Pleurodynia: Secondary | ICD-10-CM | POA: Diagnosis not present

## 2022-03-06 DIAGNOSIS — E785 Hyperlipidemia, unspecified: Secondary | ICD-10-CM | POA: Diagnosis present

## 2022-03-06 DIAGNOSIS — M4804 Spinal stenosis, thoracic region: Secondary | ICD-10-CM | POA: Diagnosis present

## 2022-03-06 DIAGNOSIS — Z87891 Personal history of nicotine dependence: Secondary | ICD-10-CM

## 2022-03-06 DIAGNOSIS — I2510 Atherosclerotic heart disease of native coronary artery without angina pectoris: Secondary | ICD-10-CM | POA: Diagnosis present

## 2022-03-06 DIAGNOSIS — C799 Secondary malignant neoplasm of unspecified site: Principal | ICD-10-CM

## 2022-03-06 DIAGNOSIS — M4802 Spinal stenosis, cervical region: Secondary | ICD-10-CM | POA: Diagnosis present

## 2022-03-06 DIAGNOSIS — Z6830 Body mass index (BMI) 30.0-30.9, adult: Secondary | ICD-10-CM

## 2022-03-06 DIAGNOSIS — Z7982 Long term (current) use of aspirin: Secondary | ICD-10-CM

## 2022-03-06 DIAGNOSIS — C3491 Malignant neoplasm of unspecified part of right bronchus or lung: Secondary | ICD-10-CM | POA: Diagnosis not present

## 2022-03-06 DIAGNOSIS — N2889 Other specified disorders of kidney and ureter: Secondary | ICD-10-CM | POA: Insufficient documentation

## 2022-03-06 DIAGNOSIS — E871 Hypo-osmolality and hyponatremia: Secondary | ICD-10-CM | POA: Diagnosis present

## 2022-03-06 DIAGNOSIS — E669 Obesity, unspecified: Secondary | ICD-10-CM | POA: Diagnosis present

## 2022-03-06 DIAGNOSIS — Z885 Allergy status to narcotic agent status: Secondary | ICD-10-CM

## 2022-03-06 LAB — CBC WITH DIFFERENTIAL/PLATELET
Abs Immature Granulocytes: 0.12 10*3/uL — ABNORMAL HIGH (ref 0.00–0.07)
Basophils Absolute: 0.1 10*3/uL (ref 0.0–0.1)
Basophils Relative: 0 %
Eosinophils Absolute: 0 10*3/uL (ref 0.0–0.5)
Eosinophils Relative: 0 %
HCT: 46.2 % — ABNORMAL HIGH (ref 36.0–46.0)
Hemoglobin: 14.1 g/dL (ref 12.0–15.0)
Immature Granulocytes: 1 %
Lymphocytes Relative: 6 %
Lymphs Abs: 1.1 10*3/uL (ref 0.7–4.0)
MCH: 29.4 pg (ref 26.0–34.0)
MCHC: 30.5 g/dL (ref 30.0–36.0)
MCV: 96.3 fL (ref 80.0–100.0)
Monocytes Absolute: 0.7 10*3/uL (ref 0.1–1.0)
Monocytes Relative: 4 %
Neutro Abs: 18.4 10*3/uL — ABNORMAL HIGH (ref 1.7–7.7)
Neutrophils Relative %: 89 %
Platelets: 362 10*3/uL (ref 150–400)
RBC: 4.8 MIL/uL (ref 3.87–5.11)
RDW: 12.4 % (ref 11.5–15.5)
WBC: 20.5 10*3/uL — ABNORMAL HIGH (ref 4.0–10.5)
nRBC: 0 % (ref 0.0–0.2)

## 2022-03-06 LAB — COMPREHENSIVE METABOLIC PANEL
ALT: 18 U/L (ref 0–44)
AST: 38 U/L (ref 15–41)
Albumin: 4.6 g/dL (ref 3.5–5.0)
Alkaline Phosphatase: 102 U/L (ref 38–126)
Anion gap: 13 (ref 5–15)
BUN: 11 mg/dL (ref 8–23)
CO2: 21 mmol/L — ABNORMAL LOW (ref 22–32)
Calcium: 9.5 mg/dL (ref 8.9–10.3)
Chloride: 98 mmol/L (ref 98–111)
Creatinine, Ser: 0.93 mg/dL (ref 0.44–1.00)
GFR, Estimated: >60 mL/min (ref >60)
Glucose, Bld: 188 mg/dL — ABNORMAL HIGH (ref 70–99)
Potassium: 4.7 mmol/L (ref 3.5–5.1)
Sodium: 132 mmol/L — ABNORMAL LOW (ref 135–145)
Total Bilirubin: 1.6 mg/dL — ABNORMAL HIGH (ref 0.3–1.2)
Total Protein: 8.7 g/dL — ABNORMAL HIGH (ref 6.5–8.1)

## 2022-03-06 LAB — TROPONIN I (HIGH SENSITIVITY)
Troponin I (High Sensitivity): 13 ng/L (ref <18)
Troponin I (High Sensitivity): 16 ng/L (ref <18)

## 2022-03-06 MED ORDER — ONDANSETRON HCL 4 MG/2ML IJ SOLN: 4.0000 mg | Freq: Four times a day (QID) | INTRAMUSCULAR | Status: DC | PRN

## 2022-03-06 MED ORDER — ONDANSETRON HCL 4 MG PO TABS
4.0000 mg | ORAL_TABLET | Freq: Four times a day (QID) | ORAL | Status: DC | PRN
  Administered 2022-03-11: 4 mg via ORAL
  Filled 2022-03-06: qty 1

## 2022-03-06 MED ORDER — HYDROMORPHONE HCL 1 MG/ML IJ SOLN
0.5000 mg | INTRAMUSCULAR | Status: DC | PRN
  Administered 2022-03-07 – 2022-03-11 (×13): 1 mg via INTRAVENOUS
  Filled 2022-03-06 (×13): qty 1

## 2022-03-06 MED ORDER — LORAZEPAM 2 MG/ML IJ SOLN: 1.0000 mg | INTRAMUSCULAR | Status: DC | PRN

## 2022-03-06 MED ORDER — ASPIRIN 81 MG PO TBEC
81.0000 mg | DELAYED_RELEASE_TABLET | Freq: Every day | ORAL | Status: DC
  Administered 2022-03-07 (×2): 81 mg via ORAL
  Filled 2022-03-06 (×2): qty 1

## 2022-03-06 MED ORDER — GABAPENTIN 300 MG PO CAPS
600.0000 mg | ORAL_CAPSULE | Freq: Three times a day (TID) | ORAL | Status: DC
  Administered 2022-03-07 – 2022-03-16 (×30): 600 mg via ORAL
  Filled 2022-03-06 (×30): qty 2

## 2022-03-06 MED ORDER — LISINOPRIL 20 MG PO TABS
20.0000 mg | ORAL_TABLET | Freq: Every morning | ORAL | Status: DC
  Administered 2022-03-07 – 2022-03-16 (×10): 20 mg via ORAL
  Filled 2022-03-06 (×10): qty 1

## 2022-03-06 MED ORDER — HEPARIN SODIUM (PORCINE) 5000 UNIT/ML IJ SOLN: 5000.0000 [IU] | Freq: Three times a day (TID) | INTRAMUSCULAR | Status: DC

## 2022-03-06 MED ORDER — ACETAMINOPHEN 325 MG PO TABS
650.0000 mg | ORAL_TABLET | Freq: Four times a day (QID) | ORAL | Status: DC | PRN
  Administered 2022-03-09 – 2022-03-10 (×3): 650 mg via ORAL
  Filled 2022-03-06 (×3): qty 2

## 2022-03-06 MED ORDER — ACETAMINOPHEN 650 MG RE SUPP: 650.0000 mg | Freq: Four times a day (QID) | RECTAL | Status: DC | PRN

## 2022-03-06 MED ORDER — LIDOCAINE 5 % EX PTCH
2.0000 | MEDICATED_PATCH | CUTANEOUS | Status: DC
  Administered 2022-03-06 – 2022-03-15 (×10): 2 via TRANSDERMAL
  Filled 2022-03-06 (×11): qty 2

## 2022-03-06 MED ORDER — GADOBUTROL 1 MMOL/ML IV SOLN
8.0000 mL | Freq: Once | INTRAVENOUS | Status: AC | PRN
  Administered 2022-03-06: 8 mL via INTRAVENOUS

## 2022-03-06 MED ORDER — OXYCODONE HCL 5 MG PO TABS
5.0000 mg | ORAL_TABLET | ORAL | Status: DC | PRN
  Administered 2022-03-07 – 2022-03-11 (×14): 5 mg via ORAL
  Filled 2022-03-06 (×14): qty 1

## 2022-03-06 MED ORDER — LABETALOL HCL 5 MG/ML IV SOLN
10.0000 mg | Freq: Once | INTRAVENOUS | Status: AC
  Administered 2022-03-06: 10 mg via INTRAVENOUS
  Filled 2022-03-06: qty 4

## 2022-03-06 MED ORDER — IOHEXOL 350 MG/ML SOLN
100.0000 mL | Freq: Once | INTRAVENOUS | Status: AC | PRN
  Administered 2022-03-06: 100 mL via INTRAVENOUS

## 2022-03-06 MED ORDER — OXYCODONE-ACETAMINOPHEN 5-325 MG PO TABS
1.0000 | ORAL_TABLET | Freq: Once | ORAL | Status: AC
  Administered 2022-03-06: 1 via ORAL
  Filled 2022-03-06: qty 1

## 2022-03-06 MED ORDER — HYDROMORPHONE HCL 1 MG/ML IJ SOLN
1.0000 mg | Freq: Once | INTRAMUSCULAR | Status: AC
  Administered 2022-03-06: 1 mg via INTRAVENOUS
  Filled 2022-03-06: qty 1

## 2022-03-06 MED ORDER — HEPARIN SODIUM (PORCINE) 5000 UNIT/ML IJ SOLN
5000.0000 [IU] | Freq: Three times a day (TID) | INTRAMUSCULAR | Status: DC
  Administered 2022-03-07 – 2022-03-08 (×4): 5000 [IU] via SUBCUTANEOUS
  Filled 2022-03-06 (×4): qty 1

## 2022-03-06 MED ORDER — AMLODIPINE BESYLATE 5 MG PO TABS
5.0000 mg | ORAL_TABLET | Freq: Every evening | ORAL | Status: DC
  Administered 2022-03-07 – 2022-03-15 (×9): 5 mg via ORAL
  Filled 2022-03-06 (×9): qty 1

## 2022-03-06 MED ORDER — ONDANSETRON HCL 4 MG/2ML IJ SOLN
4.0000 mg | Freq: Once | INTRAMUSCULAR | Status: AC
  Administered 2022-03-06: 4 mg via INTRAVENOUS
  Filled 2022-03-06: qty 2

## 2022-03-06 NOTE — ED Triage Notes (Signed)
Patient c/o SOB and chest pain yesterday. Pt report she had broken rib 2 years ago and never healed. Pt report she felt something stabbing her lungs and and having trouble breathing. Pt report taking her PRN pain meds without relief.  Pt denies n/v/d. Pt denies fever.

## 2022-03-06 NOTE — ED Provider Notes (Signed)
[facility] DEPT Provider Note   CSN: [id number] Arrival date & time: 03/06/22  1544     History  Chief Complaint  Patient presents with   Chest Pain    Brandi Holmes is a 71 y.o. female.   Chest Pain Breakfast  Patient Brandi Holmes with chest pain and abdominal pain.  Some shortness of breath.  Had previous lobectomy due to lung cancer.  Has had some pain since.  Worse now right lateral lower chest.  No relief with her pain meds.  States she recently had change from gabapentin to Lyrica.  States pain got worse.  Feels short of breath.  No relief with her tramadol.  Reviewed pulmonary notes and reviewed imaging and few months ago diagnosed with likely renal cancer and likely some spinal mets.  Scheduled for a PET scan on Monday.  States abdomen is more painful too.    Past Medical History:  Diagnosis Date   Anemia    as a child only   Coronary artery calcification of native artery    Hyperlipidemia    Hypertension    Liver spots    " per pt"   Lung nodule    Wears glasses    Wears partial dentures     Home Medications Prior to Admission medications   Medication Sig Start Date End Date Taking? Authorizing Provider  amLODipine (NORVASC) 5 MG tablet Take 1 tablet (5 mg total) by mouth every evening. Patient taking differently: Take 5 mg by mouth at bedtime. 10/09/20 10/09/21  Tolia, Sunit, DO  aspirin EC 81 MG tablet Take 81 mg by mouth at bedtime. Swallow whole.    [provider]  atorvastatin (LIPITOR) 20 MG tablet Take 1 tablet (20 mg total) by mouth at bedtime. 08/22/20 10/09/21  Tolia, Sunit, DO  calcium carbonate (OSCAL) 1500 (600 Ca) MG TABS tablet Take 600 mg of elemental calcium by mouth 3 (three) times a week.    [provider]  clobetasol ointment (TEMOVATE) 8.78 % Apply 1 application. topically 2 (two) times daily. 06/06/21   [provider]  gabapentin (NEURONTIN) 600 MG tablet Take 600 mg by mouth 3  (three) times daily. 07/05/21   [provider]  lisinopril (ZESTRIL) 20 MG tablet Take 20 mg by mouth in the morning.    [provider]  Multiple Vitamins-Minerals (MULTIVITAMIN WITH MINERALS) tablet Take 1 tablet by mouth daily.    [provider]  nystatin-triamcinolone ointment (MYCOLOG) Apply 1 application topically in the morning and at bedtime.    [provider]  oxybutynin (DITROPAN) 5 MG tablet Take 5 mg by mouth as needed for bladder spasms.    [provider]  tiZANidine (ZANAFLEX) 2 MG tablet Take 2 mg by mouth 2 (two) times daily. 07/05/21   [provider]  traMADol (ULTRAM) 50 MG tablet Take 1 tablet by mouth as needed. Every 8 hours 02/07/20   [provider]      Allergies    Crab extract allergy skin test, Crab [shellfish allergy], and Morphine and related    Review of Systems   Review of Systems  Cardiovascular:  Positive for chest pain.    Physical Exam Updated Vital Signs BP (!) 199/115   Pulse 92   Temp 97.8 F (36.6 C) (Oral)   Resp 18   SpO2 100%  Physical Exam Vitals and nursing note reviewed.  Cardiovascular:     Rate and Rhythm: Normal rate.  Chest:  Chest wall: Tenderness present.     Comments: Moderate tenderness to right lateral chest wall.  Does have scar from previous chest tube.  Has equal breath sounds. Abdominal:     Tenderness: There is abdominal tenderness.     Comments: Right upper quadrant tenderness without rebound or guarding.  No hernia palpated.  Musculoskeletal:     Right lower leg: No edema.     Left lower leg: No edema.  Skin:    General: Skin is warm.  Neurological:     Mental Status: She is alert.     ED Results / Procedures / Treatments   Labs (all labs ordered are listed, but only abnormal results are displayed) Labs Reviewed  COMPREHENSIVE METABOLIC PANEL - Abnormal; Notable for the following components:      Result Value   Sodium 132 (*)    CO2 21  (*)    Glucose, Bld 188 (*)    Total Protein 8.7 (*)    Total Bilirubin 1.6 (*)    All other components within normal limits  CBC WITH DIFFERENTIAL/PLATELET - Abnormal; Notable for the following components:   WBC 20.5 (*)    HCT 46.2 (*)    Neutro Abs 18.4 (*)    Abs Immature Granulocytes 0.12 (*)    All other components within normal limits  TROPONIN I (HIGH SENSITIVITY)  TROPONIN I (HIGH SENSITIVITY)    EKG EKG Interpretation  Date/Time:  Wednesday March 06 2022 17:32:40 EST Ventricular Rate:  84 PR Interval:  131 QRS Duration: 95 QT Interval:  373 QTC Calculation: 441 R Axis:   51 Text Interpretation: duplicate Confirmed by Davonna Belling [phone]) on 03/06/2022 5:56:06 PM  Radiology CT Angio Chest Pulmonary Embolism (PE) W or WO Contrast  Result Date: 03/06/2022 CLINICAL DATA:  Shortness of breath with chest pain. Acute abdominal pain. History of non-small cell lung cancer status post right lobectomy. EXAM: CT ANGIOGRAPHY CHEST CT ABDOMEN AND PELVIS WITH CONTRAST TECHNIQUE: Multidetector CT imaging of the chest was performed using the standard protocol during bolus administration of intravenous contrast. Multiplanar CT image reconstructions and MIPs were obtained to evaluate the vascular anatomy. Multidetector CT imaging of the abdomen and pelvis was performed using the standard protocol during bolus administration of intravenous contrast. RADIATION DOSE REDUCTION: This exam was performed according to the departmental dose-optimization program which includes automated exposure control, adjustment of the mA and/or kV according to patient size and/or use of iterative reconstruction technique. CONTRAST:  122mL OMNIPAQUE IOHEXOL 350 MG/ML SOLN COMPARISON:  MRI abdomen 12/02/2021. CT chest 08/13/2021. chest CT 02/24/2020. FINDINGS: CTA CHEST FINDINGS Cardiovascular: Satisfactory opacification of the pulmonary arteries to the segmental level. No evidence of pulmonary embolism. Normal  heart size. No pericardial effusion. Mediastinum/Nodes: No enlarged mediastinal, hilar, or axillary lymph nodes. Thyroid gland, trachea, and esophagus demonstrate no significant findings. Lungs/Pleura: Right upper lobectomy changes are again seen. There is stable scarring and bronchiectasis in the right upper lobe. There is some scattered peripheral reticular opacities which are nonspecific throughout both lower lungs. There is no focal lung infiltrate, pleural effusion or pneumothorax. There is a 3 mm nodule in the left upper lobe image 5/1 which is unchanged from most recent prior. This is new compared to 2021. No new pulmonary nodules are seen. No pleural effusion or pneumothorax. Musculoskeletal: There is new metastatic disease in the posterior elements T9, right greater than left, extending into the posterior right ninth rib. There is soft tissue extension into the central spinal canal causing central  spinal canal stenosis. There are healed posterior right rib fractures. Metastatic osseous lesion in the T1 vertebral body has increased in size now measuring 14 mm (previously 8 mm). Review of the MIP images confirms the above findings. CT ABDOMEN and PELVIS FINDINGS Hepatobiliary: Scattered rounded hypodense lesions in the liver are unchanged and favored as small cysts. No new liver lesions. Gallbladder and bile ducts are within normal limits. Pancreas: Unremarkable. No pancreatic ductal dilatation or surrounding inflammatory changes. Spleen: Normal in size without focal abnormality. Adrenals/Urinary Tract: Rounded right renal mass measuring 2.2 cm is unchanged. Otherwise, adrenal glands, kidneys and bladder are within normal limits. Stomach/Bowel: Stomach is within normal limits. Appendix appears normal. No evidence of bowel wall thickening, distention, or inflammatory changes. There is a large amount of stool throughout the colon. Vascular/Lymphatic: Aortic atherosclerosis. No enlarged abdominal or pelvic lymph  nodes. Reproductive: Status post hysterectomy. No adnexal masses. Other: No ascites or free air. Musculoskeletal: Sclerotic osseous metastatic disease has increased in the spine, most significantly at the L5 level measuring up to 4.5 cm (previously 3.1 cm). No acute fractures are seen. Review of the MIP images confirms the above findings. IMPRESSION: 1. No evidence for pulmonary embolism. 2. Stable right upper lobectomy changes. 3. Stable 3 mm left upper lobe pulmonary nodule. 4. New metastatic disease in the posterior elements of T9 with soft tissue extension into the central spinal canal causing central spinal canal stenosis. Correlate for signs of cord compression. This can be further evaluated with MRI. 5. Worsening osseous metastatic disease. 6. No acute localizing process in the abdomen or pelvis. 7. Stable right renal mass worrisome for renal cell carcinoma. Aortic Atherosclerosis (ICD10-I70.0). Electronically Signed   By: Ronney Asters M.D.   On: 03/06/2022 20:14   CT ABDOMEN PELVIS W CONTRAST  Result Date: 03/06/2022 CLINICAL DATA:  Shortness of breath with chest pain. Acute abdominal pain. History of non-small cell lung cancer status post right lobectomy. EXAM: CT ANGIOGRAPHY CHEST CT ABDOMEN AND PELVIS WITH CONTRAST TECHNIQUE: Multidetector CT imaging of the chest was performed using the standard protocol during bolus administration of intravenous contrast. Multiplanar CT image reconstructions and MIPs were obtained to evaluate the vascular anatomy. Multidetector CT imaging of the abdomen and pelvis was performed using the standard protocol during bolus administration of intravenous contrast. RADIATION DOSE REDUCTION: This exam was performed according to the departmental dose-optimization program which includes automated exposure control, adjustment of the mA and/or kV according to patient size and/or use of iterative reconstruction technique. CONTRAST:  16mL OMNIPAQUE IOHEXOL 350 MG/ML SOLN  COMPARISON:  MRI abdomen 12/02/2021. CT chest 08/13/2021. chest CT 02/24/2020. FINDINGS: CTA CHEST FINDINGS Cardiovascular: Satisfactory opacification of the pulmonary arteries to the segmental level. No evidence of pulmonary embolism. Normal heart size. No pericardial effusion. Mediastinum/Nodes: No enlarged mediastinal, hilar, or axillary lymph nodes. Thyroid gland, trachea, and esophagus demonstrate no significant findings. Lungs/Pleura: Right upper lobectomy changes are again seen. There is stable scarring and bronchiectasis in the right upper lobe. There is some scattered peripheral reticular opacities which are nonspecific throughout both lower lungs. There is no focal lung infiltrate, pleural effusion or pneumothorax. There is a 3 mm nodule in the left upper lobe image 5/1 which is unchanged from most recent prior. This is new compared to 2021. No new pulmonary nodules are seen. No pleural effusion or pneumothorax. Musculoskeletal: There is new metastatic disease in the posterior elements T9, right greater than left, extending into the posterior right ninth rib. There is soft tissue extension  into the central spinal canal causing central spinal canal stenosis. There are healed posterior right rib fractures. Metastatic osseous lesion in the T1 vertebral body has increased in size now measuring 14 mm (previously 8 mm). Review of the MIP images confirms the above findings. CT ABDOMEN and PELVIS FINDINGS Hepatobiliary: Scattered rounded hypodense lesions in the liver are unchanged and favored as small cysts. No new liver lesions. Gallbladder and bile ducts are within normal limits. Pancreas: Unremarkable. No pancreatic ductal dilatation or surrounding inflammatory changes. Spleen: Normal in size without focal abnormality. Adrenals/Urinary Tract: Rounded right renal mass measuring 2.2 cm is unchanged. Otherwise, adrenal glands, kidneys and bladder are within normal limits. Stomach/Bowel: Stomach is within normal  limits. Appendix appears normal. No evidence of bowel wall thickening, distention, or inflammatory changes. There is a large amount of stool throughout the colon. Vascular/Lymphatic: Aortic atherosclerosis. No enlarged abdominal or pelvic lymph nodes. Reproductive: Status post hysterectomy. No adnexal masses. Other: No ascites or free air. Musculoskeletal: Sclerotic osseous metastatic disease has increased in the spine, most significantly at the L5 level measuring up to 4.5 cm (previously 3.1 cm). No acute fractures are seen. Review of the MIP images confirms the above findings. IMPRESSION: 1. No evidence for pulmonary embolism. 2. Stable right upper lobectomy changes. 3. Stable 3 mm left upper lobe pulmonary nodule. 4. New metastatic disease in the posterior elements of T9 with soft tissue extension into the central spinal canal causing central spinal canal stenosis. Correlate for signs of cord compression. This can be further evaluated with MRI. 5. Worsening osseous metastatic disease. 6. No acute localizing process in the abdomen or pelvis. 7. Stable right renal mass worrisome for renal cell carcinoma. Aortic Atherosclerosis (ICD10-I70.0). Electronically Signed   By: Ronney Asters M.D.   On: 03/06/2022 20:14   DG Chest 2 View  Result Date: 03/06/2022 CLINICAL DATA:  Chest pain. EXAM: CHEST - 2 VIEW COMPARISON:  06/15/2020 FINDINGS: The cardiac silhouette, mediastinal and hilar contours are within normal limits and stable. Stable tortuosity of the thoracic aorta. Chronic emphysematous changes and remote surgical changes involving the right lung. Probable radiation changes also. No acute pulmonary findings or worrisome pulmonary lesions. IMPRESSION: Chronic lung changes but no acute pulmonary findings. Electronically Signed   By: Marijo Sanes M.D.   On: 03/06/2022 17:20    Procedures Procedures    Medications Ordered in ED Medications  lidocaine (LIDODERM) 5 % 2 patch (2 patches Transdermal Patch  Applied 03/06/22 2215)  LORazepam (ATIVAN) injection 1 mg (has no administration in time range)  oxyCODONE-acetaminophen (PERCOCET/ROXICET) 5-325 MG per tablet 1 tablet (1 tablet Oral Given 03/06/22 1643)  HYDROmorphone (DILAUDID) injection 1 mg (1 mg Intravenous Given 03/06/22 1823)  ondansetron (ZOFRAN) injection 4 mg (4 mg Intravenous Given 03/06/22 1823)  iohexol (OMNIPAQUE) 350 MG/ML injection 100 mL (100 mLs Intravenous Contrast Given 03/06/22 1945)  HYDROmorphone (DILAUDID) injection 1 mg (1 mg Intravenous Given 03/06/22 2145)  labetalol (NORMODYNE) injection 10 mg (10 mg Intravenous Given 03/06/22 2215)  gadobutrol (GADAVIST) 1 MMOL/ML injection 8 mL (8 mLs Intravenous Contrast Given 03/06/22 2301)    ED Course/ Medical Decision Making/ A&P                           Medical Decision Making Amount and/or Complexity of Data Reviewed Radiology: ordered.  Risk Prescription drug management. Decision regarding hospitalization.   Patient with chest pain.  Abdominal pain.  History of metastatic cancer.  Does put  her at high risk for pulmonary embolism.  Also potentially worsening metastatic disease.  We will get CT scan of chest abdomen pelvis.  We will do angiography of her chest due to shortness of breath and will get abdomen pelvis to evaluate for potential worsening metastatic disease.  Did have T9 lesion on previous scan which could be doing some of this right flank pain.  We will treat with pain medicine.  Continued pain despite Dilaudid.  CT scan does show worsening of the T9 lesion.  Potentially could have some cord compression.  No neurodeficits just pain.  I think patient would benefit from an MRI but I think she needs admission for pain control.  Do not think would be emergent surgery resulting from the MRI findings.        Final Clinical Impression(s) / ED Diagnoses Final diagnoses:  Metastatic malignant neoplasm, unspecified site [facility])    Rx / [place] Orders ED  Discharge Orders     None         Davonna Belling, MD 03/06/22 2314

## 2022-03-06 NOTE — ED Notes (Signed)
Unable to collect blood from pt. X2 attempts.

## 2022-03-06 NOTE — Assessment & Plan Note (Addendum)
Observation med/surg bed. Proceed with MRI thoracic and lumbar spine. IV dilaudid 1 mg q2h prn severe pain. Start oxycodone 5 mg q6h prn moderate pain. Restart gabapentin. Stop lyrica

## 2022-03-06 NOTE — Assessment & Plan Note (Signed)
Pt declined adjunct chemo in 2021 where her lobectomy was performed in [place]. Pt also refused chemo when she returned back to [place].

## 2022-03-06 NOTE — ED Notes (Signed)
Unable To sign MSE , Pad not working

## 2022-03-06 NOTE — H&P (Addendum)
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  Patient's been on Ultram every 6 hours at home.  Patient was recently seen by her PCP that stopped her gabapentin and switched her to Lyrica due to continued pain.  Patient had a known renal mass since 2021 has been slowly growing in size.  Her most recent MRI of the abdomen in August 2023 showed that her renal mass had grown in size and now is measuring 2.4 x 2.3 x 2.6 in her lateral lower right kidney.  This is concerning for possible malignancy.  She also had enhancement on the right side of T9 and L5 for bony mets.  She scheduled for PET/CT on 03/11/2022.  Patient states that her right-sided rib pain is gotten worse.  She continues to blame her prior thoracotomy scar and the rib fractures that are associated with her lobectomy is causing her pain.  She states that Ultram is no longer working for her.  CTA chest was negative for PE.  There did continue to show T9 mets with some soft tissue extension into the central spinal canal causing central spinal canal stenosis.  MRI of thoracic spine has been ordered to rule out cord compression.  Patient is able to walk but does complain bitterly about her right-sided rib pain.  [facility] hospital[facility] contacted for admission due to uncontrolled pain.   ED Course: CTPA negative for PE. CT showed central spinal canal stenosis.  Review of Systems:  Review of Systems  Constitutional: Negative.    HENT: Negative.    Eyes: Negative.   Respiratory: Negative.    Cardiovascular: Negative.   Gastrointestinal: Negative.   Genitourinary: Negative.   Musculoskeletal:  Positive for back pain.       Right sided rib pain  Skin: Negative.   Neurological: Negative.   Endo/Heme/Allergies: Negative.   Psychiatric/Behavioral: Negative.    All other systems reviewed and are negative.   Past Medical History:  Diagnosis Date   Anemia    as a child only   Coronary artery calcification of native artery    Hyperlipidemia    Hypertension    Liver spots    " per pt"   Lung nodule    Wears glasses    Wears partial dentures     Past Surgical History:  Procedure Laterality Date   ABDOMINAL HYSTERECTOMY  1974   heavy bleeding   ABDOMINAL HYSTERECTOMY     BRONCHIAL BRUSHINGS  08/13/2019   Procedure: BRONCHIAL BRUSHINGS;  Surgeon: Garner Nash, DO;  Location: [facility];  Service: Pulmonary;;   BRONCHIAL WASHINGS  08/13/2019   Procedure: BRONCHIAL WASHINGS;  Surgeon: Garner Nash, DO;  Location: [facility];  Service: Pulmonary;;   COLONOSCOPY W/ BIOPSIES AND POLYPECTOMY     DILATION AND CURETTAGE OF UTERUS     FINE NEEDLE ASPIRATION  08/13/2019   Procedure: FINE NEEDLE ASPIRATION (FNA) LINEAR;  Surgeon: Garner Nash, DO;  Location: [facility];  Service: Pulmonary;;   FOOT SURGERY  bilateral bunions and hammer toes   LUNG BIOPSY  08/13/2019   Procedure: LUNG BIOPSY;  Surgeon: Garner Nash, DO;  Location: [facility];  Service: Pulmonary;;   MULTIPLE TOOTH EXTRACTIONS     VIDEO BRONCHOSCOPY WITH ENDOBRONCHIAL NAVIGATION Right 08/13/2019   Procedure: VIDEO BRONCHOSCOPY WITH ENDOBRONCHIAL NAVIGATION;  Surgeon: Garner Nash, DO;  Location: [facility];  Service: Pulmonary;  Laterality: Right;   VIDEO BRONCHOSCOPY WITH ENDOBRONCHIAL ULTRASOUND Right 08/13/2019   Procedure: VIDEO BRONCHOSCOPY WITH ENDOBRONCHIAL ULTRASOUND;  Surgeon: Garner Nash, DO;  Location: [facility];  Service: Pulmonary;  Laterality: Right;     reports that she quit smoking about 7 years ago. Her smoking use included cigarettes. She has a 15.00 pack-year smoking history. She has never used smokeless tobacco. She reports that she does not currently use alcohol. She reports that she does not currently use drugs.  Allergies  Allergen Reactions   Crab Extract Allergy Skin Test Swelling    Swelling of feet   Crab [Shellfish Allergy] Swelling    Swelling of feet   Morphine And Related     Bad dreams    Family History  Problem Relation Age of Onset   Heart disease Mother 53       bypass at age    Hypertension Mother    Pneumonia Brother    Cancer Paternal Aunt    Diabetes Neg Hx    Stroke Neg Hx     Prior to Admission medications   Medication Sig Start Date End Date Taking? Authorizing Provider  amLODipine (NORVASC) 5 MG tablet Take 1 tablet (5 mg total) by mouth every evening. Patient taking differently: Take 5 mg by mouth at bedtime. 10/09/20 10/09/21  Tolia, Sunit, DO  aspirin EC 81 MG tablet Take 81 mg by mouth at bedtime. Swallow whole.    [provider]  atorvastatin (LIPITOR) 20 MG tablet Take 1 tablet (20 mg total) by mouth at bedtime. 08/22/20 10/09/21  Tolia, Sunit, DO  calcium carbonate (OSCAL) 1500 (600 Ca) MG TABS tablet Take 600 mg of elemental calcium by mouth 3 (three) times a week.    [provider]  clobetasol ointment (TEMOVATE) 1.61 % Apply 1 application. topically 2 (two) times daily. 06/06/21   [provider]  gabapentin (NEURONTIN) 600 MG tablet Take 600 mg by mouth 3 (three) times daily. 07/05/21   [provider]  lisinopril (ZESTRIL) 20 MG tablet Take 20 mg by mouth in the morning.    [provider]  Multiple Vitamins-Minerals (MULTIVITAMIN WITH MINERALS) tablet Take 1 tablet by mouth daily.    [provider]  nystatin-triamcinolone ointment (MYCOLOG) Apply 1 application topically in the  morning and at bedtime.    [provider]  oxybutynin (DITROPAN) 5 MG tablet Take 5 mg by mouth as needed for bladder spasms.    [provider]  tiZANidine (ZANAFLEX) 2 MG tablet Take 2 mg by mouth 2 (two) times daily. 07/05/21   [provider]  traMADol (ULTRAM) 50 MG tablet Take 1 tablet by mouth as needed. Every 8 hours 02/07/20   [provider]    Physical Exam: Vitals:   03/06/22 1845 03/06/22 1900 03/06/22 1915 03/06/22 1930  BP: (!) 160/74 (!) 174/78 (!) 206/77 (!) 199/115  Pulse: 74 80 86 90  Resp:      Temp:      TempSrc:      SpO2: 95% 98% 97% 100%    Physical Exam Vitals and  nursing note reviewed.  Constitutional:      General: She is not in acute distress.    Appearance: Normal appearance. She is not ill-appearing, toxic-appearing or diaphoretic.  HENT:     Head: Normocephalic and atraumatic.     Nose: Nose normal.  Cardiovascular:     Rate and Rhythm: Normal rate and regular rhythm.     Pulses: Normal pulses.  Pulmonary:     Effort: Pulmonary effort is normal.     Breath sounds: Normal breath sounds.  Abdominal:     General: Bowel sounds are normal. There is no distension.     Tenderness: There is no abdominal tenderness. There is no guarding.  Musculoskeletal:     Right lower leg: No edema.     Left lower leg: No edema.  Skin:    General: Skin is warm and dry.     Capillary Refill: Capillary refill takes less than 2 seconds.     Comments: Tenderness along well healed right thoracotomy scar Pt also c/o right breast tenderness when stethoscope touches her breast through her clothes.  Neurological:     General: No focal deficit present.     Mental Status: She is alert and oriented to person, place, and time.      Labs on Admission: I have personally reviewed following labs and imaging studies  CBC: Recent Labs  Lab 03/06/22 1638  WBC 20.5*  NEUTROABS 18.4*  HGB 14.1  HCT 46.2*  MCV 96.3  PLT 962   Basic  Metabolic Panel: Recent Labs  Lab 03/06/22 1638  NA 132*  K 4.7  CL 98  CO2 21*  GLUCOSE 188*  BUN 11  CREATININE 0.93  CALCIUM 9.5   GFR: Estimated Creatinine Clearance: 58.7 mL/min (by C-G formula based on SCr of 0.93 mg/dL). Liver Function Tests: Recent Labs  Lab 03/06/22 1638  AST 38  ALT 18  ALKPHOS 102  BILITOT 1.6*  PROT 8.7*  ALBUMIN 4.6   No results for input(s): "LIPASE", "AMYLASE" in the last 168 hours. No results for input(s): "AMMONIA" in the last 168 hours. Coagulation Profile: No results for input(s): "INR", "PROTIME" in the last 168 hours. Cardiac Enzymes: Recent Labs  Lab 03/06/22 1638 03/06/22 1838  TROPONINIHS 13 16   BNP (last 3 results) No results for input(s): "PROBNP" in the last 8760 hours. HbA1C: No results for input(s): "HGBA1C" in the last 72 hours. CBG: No results for input(s): "GLUCAP" in the last 168 hours. Lipid Profile: No results for input(s): "CHOL", "HDL", "LDLCALC", "TRIG", "CHOLHDL", "LDLDIRECT" in the last 72 hours. Thyroid Function Tests: No results for input(s): "TSH", "T4TOTAL", "FREET4", "T3FREE", "THYROIDAB" in the last 72 hours. Anemia Panel: No results for input(s): "VITAMINB12", "FOLATE", "FERRITIN", "TIBC", "IRON", "RETICCTPCT" in the last 72 hours. Urine analysis:    Component Value Date/Time   COLORURINE STRAW (A) 07/31/2019 1721   APPEARANCEUR CLEAR 07/31/2019 1721   LABSPEC 1.019 07/31/2019 1721   PHURINE 8.0 07/31/2019 1721   GLUCOSEU NEGATIVE 07/31/2019 1721   HGBUR NEGATIVE 07/31/2019 1721   BILIRUBINUR NEGATIVE 07/31/2019 1721   KETONESUR 20 (A) 07/31/2019 1721   PROTEINUR NEGATIVE 07/31/2019 1721   UROBILINOGEN 0.2 06/14/2010 1748   NITRITE NEGATIVE 07/31/2019 1721   LEUKOCYTESUR NEGATIVE 07/31/2019 1721    Radiological Exams on Admission: I have personally reviewed images CT Angio Chest Pulmonary Embolism (PE) W or WO Contrast  Result Date: 03/06/2022 CLINICAL DATA:  Shortness of breath with  chest pain. Acute abdominal pain. History of non-small  cell lung cancer status post right lobectomy. EXAM: CT ANGIOGRAPHY CHEST CT ABDOMEN AND PELVIS WITH CONTRAST TECHNIQUE: Multidetector CT imaging of the chest was performed using the standard protocol during bolus administration of intravenous contrast. Multiplanar CT image reconstructions and MIPs were obtained to evaluate the vascular anatomy. Multidetector CT imaging of the abdomen and pelvis was performed using the standard protocol during bolus administration of intravenous contrast. RADIATION DOSE REDUCTION: This exam was performed according to the departmental dose-optimization program which includes automated exposure control, adjustment of the mA and/or kV according to patient size and/or use of iterative reconstruction technique. CONTRAST:  162mL OMNIPAQUE IOHEXOL 350 MG/ML SOLN COMPARISON:  MRI abdomen 12/02/2021. CT chest 08/13/2021. chest CT 02/24/2020. FINDINGS: CTA CHEST FINDINGS Cardiovascular: Satisfactory opacification of the pulmonary arteries to the segmental level. No evidence of pulmonary embolism. Normal heart size. No pericardial effusion. Mediastinum/Nodes: No enlarged mediastinal, hilar, or axillary lymph nodes. Thyroid gland, trachea, and esophagus demonstrate no significant findings. Lungs/Pleura: Right upper lobectomy changes are again seen. There is stable scarring and bronchiectasis in the right upper lobe. There is some scattered peripheral reticular opacities which are nonspecific throughout both lower lungs. There is no focal lung infiltrate, pleural effusion or pneumothorax. There is a 3 mm nodule in the left upper lobe image 5/1 which is unchanged from most recent prior. This is new compared to 2021. No new pulmonary nodules are seen. No pleural effusion or pneumothorax. Musculoskeletal: There is new metastatic disease in the posterior elements T9, right greater than left, extending into the posterior right ninth rib. There is  soft tissue extension into the central spinal canal causing central spinal canal stenosis. There are healed posterior right rib fractures. Metastatic osseous lesion in the T1 vertebral body has increased in size now measuring 14 mm (previously 8 mm). Review of the MIP images confirms the above findings. CT ABDOMEN and PELVIS FINDINGS Hepatobiliary: Scattered rounded hypodense lesions in the liver are unchanged and favored as small cysts. No new liver lesions. Gallbladder and bile ducts are within normal limits. Pancreas: Unremarkable. No pancreatic ductal dilatation or surrounding inflammatory changes. Spleen: Normal in size without focal abnormality. Adrenals/Urinary Tract: Rounded right renal mass measuring 2.2 cm is unchanged. Otherwise, adrenal glands, kidneys and bladder are within normal limits. Stomach/Bowel: Stomach is within normal limits. Appendix appears normal. No evidence of bowel wall thickening, distention, or inflammatory changes. There is a large amount of stool throughout the colon. Vascular/Lymphatic: Aortic atherosclerosis. No enlarged abdominal or pelvic lymph nodes. Reproductive: Status post hysterectomy. No adnexal masses. Other: No ascites or free air. Musculoskeletal: Sclerotic osseous metastatic disease has increased in the spine, most significantly at the L5 level measuring up to 4.5 cm (previously 3.1 cm). No acute fractures are seen. Review of the MIP images confirms the above findings. IMPRESSION: 1. No evidence for pulmonary embolism. 2. Stable right upper lobectomy changes. 3. Stable 3 mm left upper lobe pulmonary nodule. 4. New metastatic disease in the posterior elements of T9 with soft tissue extension into the central spinal canal causing central spinal canal stenosis. Correlate for signs of cord compression. This can be further evaluated with MRI. 5. Worsening osseous metastatic disease. 6. No acute localizing process in the abdomen or pelvis. 7. Stable right renal mass worrisome  for renal cell carcinoma. Aortic Atherosclerosis (ICD10-I70.0). Electronically Signed   By: Ronney Asters M.D.   On: 03/06/2022 20:14   CT ABDOMEN PELVIS W CONTRAST  Result Date: 03/06/2022 CLINICAL DATA:  Shortness of breath with chest  pain. Acute abdominal pain. History of non-small cell lung cancer status post right lobectomy. EXAM: CT ANGIOGRAPHY CHEST CT ABDOMEN AND PELVIS WITH CONTRAST TECHNIQUE: Multidetector CT imaging of the chest was performed using the standard protocol during bolus administration of intravenous contrast. Multiplanar CT image reconstructions and MIPs were obtained to evaluate the vascular anatomy. Multidetector CT imaging of the abdomen and pelvis was performed using the standard protocol during bolus administration of intravenous contrast. RADIATION DOSE REDUCTION: This exam was performed according to the departmental dose-optimization program which includes automated exposure control, adjustment of the mA and/or kV according to patient size and/or use of iterative reconstruction technique. CONTRAST:  153mL OMNIPAQUE IOHEXOL 350 MG/ML SOLN COMPARISON:  MRI abdomen 12/02/2021. CT chest 08/13/2021. chest CT 02/24/2020. FINDINGS: CTA CHEST FINDINGS Cardiovascular: Satisfactory opacification of the pulmonary arteries to the segmental level. No evidence of pulmonary embolism. Normal heart size. No pericardial effusion. Mediastinum/Nodes: No enlarged mediastinal, hilar, or axillary lymph nodes. Thyroid gland, trachea, and esophagus demonstrate no significant findings. Lungs/Pleura: Right upper lobectomy changes are again seen. There is stable scarring and bronchiectasis in the right upper lobe. There is some scattered peripheral reticular opacities which are nonspecific throughout both lower lungs. There is no focal lung infiltrate, pleural effusion or pneumothorax. There is a 3 mm nodule in the left upper lobe image 5/1 which is unchanged from most recent prior. This is new compared to  2021. No new pulmonary nodules are seen. No pleural effusion or pneumothorax. Musculoskeletal: There is new metastatic disease in the posterior elements T9, right greater than left, extending into the posterior right ninth rib. There is soft tissue extension into the central spinal canal causing central spinal canal stenosis. There are healed posterior right rib fractures. Metastatic osseous lesion in the T1 vertebral body has increased in size now measuring 14 mm (previously 8 mm). Review of the MIP images confirms the above findings. CT ABDOMEN and PELVIS FINDINGS Hepatobiliary: Scattered rounded hypodense lesions in the liver are unchanged and favored as small cysts. No new liver lesions. Gallbladder and bile ducts are within normal limits. Pancreas: Unremarkable. No pancreatic ductal dilatation or surrounding inflammatory changes. Spleen: Normal in size without focal abnormality. Adrenals/Urinary Tract: Rounded right renal mass measuring 2.2 cm is unchanged. Otherwise, adrenal glands, kidneys and bladder are within normal limits. Stomach/Bowel: Stomach is within normal limits. Appendix appears normal. No evidence of bowel wall thickening, distention, or inflammatory changes. There is a large amount of stool throughout the colon. Vascular/Lymphatic: Aortic atherosclerosis. No enlarged abdominal or pelvic lymph nodes. Reproductive: Status post hysterectomy. No adnexal masses. Other: No ascites or free air. Musculoskeletal: Sclerotic osseous metastatic disease has increased in the spine, most significantly at the L5 level measuring up to 4.5 cm (previously 3.1 cm). No acute fractures are seen. Review of the MIP images confirms the above findings. IMPRESSION: 1. No evidence for pulmonary embolism. 2. Stable right upper lobectomy changes. 3. Stable 3 mm left upper lobe pulmonary nodule. 4. New metastatic disease in the posterior elements of T9 with soft tissue extension into the central spinal canal causing central  spinal canal stenosis. Correlate for signs of cord compression. This can be further evaluated with MRI. 5. Worsening osseous metastatic disease. 6. No acute localizing process in the abdomen or pelvis. 7. Stable right renal mass worrisome for renal cell carcinoma. Aortic Atherosclerosis (ICD10-I70.0). Electronically Signed   By: Ronney Asters M.D.   On: 03/06/2022 20:14   DG Chest 2 View  Result Date: 03/06/2022 CLINICAL DATA:  Chest pain. EXAM: CHEST - 2 VIEW COMPARISON:  06/15/2020 FINDINGS: The cardiac silhouette, mediastinal and hilar contours are within normal limits and stable. Stable tortuosity of the thoracic aorta. Chronic emphysematous changes and remote surgical changes involving the right lung. Probable radiation changes also. No acute pulmonary findings or worrisome pulmonary lesions. IMPRESSION: Chronic lung changes but no acute pulmonary findings. Electronically Signed   By: Marijo Sanes M.D.   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 possibly from renal mass vs lung cancer   Hypertension   Adenocarcinoma of right lung, stage 2 (HCC)    Assessment and Plan: * Rib pain on right side Observation med/surg bed. Proceed with MRI thoracic and lumbar spine. IV dilaudid 1 mg q2h prn severe pain. Start oxycodone 5 mg q6h prn moderate pain. Restart gabapentin. 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 possibly from renal mass vs lung cancer Unknown if vertebral metastasis from lung cancer or renal tumor. Pt scheduled for PET/CT on 03-11-2022. She is actively followed by pulmonary and oncology. She will need to complete her outpatient PET/CT and followup with her oncology and pulmonologist.  Adenocarcinoma of right lung, stage 2 ([place]) Pt declined adjunct chemo in 2021  where her lobectomy was performed in [place]. Pt also refused chemo when she returned back to [place].  Hypertension Continue with lisinopril 20 mg and norvasc 5 mg.   DVT prophylaxis: SQ Heparin Code Status: Full Code Family Communication: no family at bedside  Disposition Plan: return home  Consults called: none  Admission status: Observation, Med-Surg   Kristopher Oppenheim, DO [facility] 03/06/2022, 9:34 PM

## 2022-03-06 NOTE — Assessment & Plan Note (Signed)
Continue with lisinopril 20 mg and norvasc 5 mg.

## 2022-03-06 NOTE — ED Provider Triage Note (Signed)
Emergency Medicine Provider Triage Evaluation Note  Aalina Brege , a 71 y.o. female  was evaluated in triage.  Pt complains of chest pain and shortness of breath. History of lung cancer, with surgery in 2021.  Marland Kitchen  Review of Systems  Positive: Chest pain, shortness of breath Negative: Fever, chills, abdominal pain  Physical Exam  BP (!) 172/74 (BP Location: Right Arm)   Pulse 91   Temp 97.8 F (36.6 C) (Oral)   Resp 20   SpO2 100%  Gen:   Awake, appears uncomfortable Resp:  Normal effort, lungs CTA MSK:   Moves extremities without difficulty  Other:  Tenderness overlying prior surgical scars, no redness or rash  Medical Decision Making  Medically screening exam initiated at 4:26 PM.  Appropriate orders placed.  Jaiyla Granados McAdoo-Bey was informed that the remainder of the evaluation will be completed by another provider, this initial triage assessment does not replace that evaluation, and the importance of remaining in the ED until their evaluation is complete.     Etta Quill, NP 03/06/22 2202

## 2022-03-06 NOTE — Subjective & Objective (Signed)
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  Patient's been on Ultram every 6 hours at home.  Patient was recently seen by her PCP that stopped her gabapentin and switched her to Lyrica due to continued pain.  Patient had a known renal mass since 2021 has been slowly growing in size.  Her most recent MRI of the abdomen in August 2023 showed that her renal mass had grown in size and now is measuring 2.4 x 2.3 x 2.6 in her lateral lower right kidney.  This is concerning for possible malignancy.  She also had enhancement on the right side of T9 and L5 for bony mets.  She scheduled for PET/CT on 03/11/2022.  Patient states that her right-sided rib pain is gotten worse.  She continues to blame her prior thoracotomy scar and the rib fractures that are associated with her lobectomy is causing her pain.  She states that Ultram is no longer working for her.  CTA chest was negative for PE.  There did continue to show T9 mets with some soft tissue extension into the central spinal canal causing central spinal canal stenosis.  MRI of thoracic spine has been ordered to rule out cord compression.  Patient is able to walk but does complain bitterly about her right-sided rib pain.  [facility] hospital[facility] contacted for admission due to uncontrolled pain.

## 2022-03-06 NOTE — ED Notes (Signed)
Patient back from MRI. Patient rates her pain in a 6/10, better than before. States her lidocaine patch is working. JRPRN

## 2022-03-06 NOTE — Assessment & Plan Note (Signed)
Unknown if vertebral metastasis from lung cancer or renal tumor. Pt scheduled for PET/CT on 03-11-2022. She is actively followed by pulmonary and oncology. She will need to complete her outpatient PET/CT and followup with her oncology and pulmonologist.

## 2022-03-07 ENCOUNTER — Encounter (HOSPITAL_COMMUNITY): Payer: Self-pay | Admitting: Internal Medicine

## 2022-03-07 DIAGNOSIS — R0781 Pleurodynia: Secondary | ICD-10-CM | POA: Diagnosis not present

## 2022-03-07 LAB — URINALYSIS, ROUTINE W REFLEX MICROSCOPIC
Bilirubin Urine: NEGATIVE
Glucose, UA: NEGATIVE mg/dL
Ketones, ur: NEGATIVE mg/dL
Nitrite: NEGATIVE
Protein, ur: NEGATIVE mg/dL
Specific Gravity, Urine: 1.004 — ABNORMAL LOW (ref 1.005–1.030)
pH: 5 (ref 5.0–8.0)

## 2022-03-07 MED ORDER — SENNOSIDES-DOCUSATE SODIUM 8.6-50 MG PO TABS
1.0000 | ORAL_TABLET | Freq: Two times a day (BID) | ORAL | Status: DC
  Administered 2022-03-07 – 2022-03-16 (×14): 1 via ORAL
  Filled 2022-03-07 (×18): qty 1

## 2022-03-07 MED ORDER — DEXAMETHASONE 4 MG PO TABS
4.0000 mg | ORAL_TABLET | Freq: Three times a day (TID) | ORAL | Status: DC
  Administered 2022-03-07 – 2022-03-16 (×27): 4 mg via ORAL
  Filled 2022-03-07 (×27): qty 1

## 2022-03-07 MED ORDER — TIZANIDINE HCL 4 MG PO TABS
2.0000 mg | ORAL_TABLET | Freq: Once | ORAL | Status: AC
  Administered 2022-03-07: 2 mg via ORAL
  Filled 2022-03-07: qty 1

## 2022-03-07 MED ORDER — TIZANIDINE HCL 4 MG PO TABS
2.0000 mg | ORAL_TABLET | Freq: Every day | ORAL | Status: DC
  Administered 2022-03-07 – 2022-03-15 (×9): 2 mg via ORAL
  Filled 2022-03-07 (×9): qty 1

## 2022-03-07 MED ORDER — POLYETHYLENE GLYCOL 3350 17 G PO PACK
17.0000 g | PACK | Freq: Every day | ORAL | Status: DC | PRN
  Administered 2022-03-15: 17 g via ORAL
  Filled 2022-03-07: qty 1

## 2022-03-07 NOTE — Progress Notes (Signed)
Received call on about this patient. History of NSCLC, also with a kidney tumor. Presents with widespread mets, including a T9 lesion with prominent spinal canal stenosis at this level.   -agree with starting steroids -Q 6-8 hr neuro checks -neurosurgery consult -consider palliative XRT to spine if not a surgical candidate; if tumor is renal cell cancer, tumor unfortunately may be less radiosensitive to XRT; can consider urgent XRT if neuro change  ------------------------------------------------  Jodelle Gross, MD, PhD

## 2022-03-07 NOTE — Plan of Care (Signed)

## 2022-03-07 NOTE — Progress Notes (Signed)
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 in [place] in 2021, known renal mass since 2021 has been slowly growing, most recent MRI in August 2023 showed growing in size 2.4 X2.3X 2.6 lateral lower right kidney concerning for possible malignancy, also had enhancement of right side of T9 and L5 for bony mets and is scheduled for PET/CT on 03/11/2022, hypertension, presented w/ worsening right-sided rib pain has been ongoing for the last several months,was recently seen by her PCP that stopped her gabapentin and switched her to Lyrica due to continued pain  Seen in the ED, afebrile initial BP high up to 979 systolic, labs with leukocytosis hyponatremia mildly elevated TB.  CTA chest was negative for PE-showed T9 mets with some soft tissue extension into the central spinal canal causing central spinal canal stenosis. Patient ble to walk but does complain bitterly about her right-sided rib pain. [facility] hospitalist contacted for admission due to uncontrolled pain.  MRI T-L spine done showed "Multifocal osseous metastatic disease in the thoracic and lumbar spine, with the most significant, expansile lesion occupying the majority of the T9 vertebral body and extending into the right greater than left posterior elements and proximal right ninth and tenth ribs. This causes severe spinal canal stenosis, with deformation of the spinal cord but no abnormal cord signal, as well as mild to moderate spinal canal stenosis at T9-T10, moderate neural foraminal narrowing T8-T9, and severe right neural foraminal narrowing at T9-T10.Additional metastatic disease is seen at T1, T8, T10, L3, L4, and L5, as described above. C6-C7 moderate spinal canal stenosis. C7-T1 mild-to-moderate spinal canal stenosis. L3-L4 and L4-L5 narrowing of the lateral recesses, which  could affect the descending L4 and L5 nerve roots, respectively. L1-L2 and L2-L3 mild right neural foraminal narrowing. 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 See report of MRI for detail, on Neurontin, lidocaine patch along with p.o. oxy IV Dilaudid for pain control.  Consulted Dr. Hilbert Odor, unclear if the cancer in the bone is from her previous lung cancer versus metastatic cancer from the kidney. IR/Rad onc consult per [facility]-contacted by [facility]-starting on dexamethasone, continue aggressive pain management, bowel regimen.    Right renal mass worrisome for renal carcinoma-per onco  Adenocarcinoma the right lung stage II followed by Dr. Julien Nordmann on right lobectomy in 2021.  Uncontrolled hypertension: on amlod 5 and licinopril 20 mg , prn meds.  Continue and adjust antihypertensive  Leukocytosis: Chest x-ray CT scan no pneumonia,afebrile, recheck labs, check UA. Recent Labs  Lab 03/06/22 1638  WBC 20.5*    Class I Obesity:Patient's Body mass index is 30.99 kg/m. : Will benefit with PCP follow-up, weight loss  healthy lifestyle and outpatient sleep evaluation.   DVT prophylaxis: heparin injection 5,000 Units Start: 03/07/22 0600 SCDs Start: 03/06/22  2323 Code Status:   Code Status: Full Code Family Communication: plan of care discussed with patient at bedside. Patient status is: Admitted as observation,remains inpatient for  ongoing cancer related pain management and further work-up Level of care: Med-Surg  Dispo: The patient is from: home by herself            Anticipated disposition: Home in 2-3 days  Objective: Vitals last 24 hrs: Vitals:   03/07/22 0423 03/07/22 0500 03/07/22 0857 03/07/22 1209  BP: (!) 155/74  128/60 112/67  Pulse: 77  70 73  Resp: 18  20 20   Temp: 98.3 F (36.8 C)  99.2 F (37.3 C) 98.6 F (37 C)  TempSrc: Oral  Oral Oral  SpO2: 93%  96% 98%  Weight:  87.1 kg    Height:       Weight change:   Physical Examination: General exam: alert awake, older than stated age HEENT:Oral mucosa moist, Ear/Nose WNL grossly Respiratory system: bilaterally clear BS, no use of accessory muscle Cardiovascular system: S1 & S2 +, No JVD. Gastrointestinal system: Abdomen soft,NT,ND, BS+ Nervous System:Alert, awake, moving extremities. Extremities: LE edema neg,distal peripheral pulses palpable.  Skin: No rashes,no icterus. MSK: Normal muscle bulk,tone, power  Medications reviewed:  Scheduled Meds:  amLODipine  5 mg Oral QPM   aspirin EC  81 mg Oral QHS   dexamethasone  4 mg Oral Q8H   gabapentin  600 mg Oral TID   heparin  5,000 Units Subcutaneous Q8H   lidocaine  2 patch Transdermal Q24H   lisinopril  20 mg Oral q AM   senna-docusate  1 tablet Oral BID   tiZANidine  2 mg Oral QHS   Continuous Infusions:    Diet Order             Diet NPO time specified Except for: Sips with Meds  Diet effective midnight           Diet 2 gram sodium Room service appropriate? Yes; Fluid consistency: Thin  Diet effective now                   Intake/Output Summary (Last 24 hours) at 03/07/2022 1222 Last data filed at 03/07/2022 1147 Gross per 24 hour  Intake 240 ml  Output 1050 ml  Net -810 ml   Net IO Since Admission: -810 mL [03/07/22 1222]  Wt Readings from Last 3 Encounters:  03/07/22 87.1 kg  02/22/22 83.5 kg  10/09/21 85.8 kg     Unresulted Labs (From admission, onward)     None     Data Reviewed: I have personally reviewed following labs and imaging studies CBC: Recent Labs  Lab 03/06/22 1638  WBC 20.5*  NEUTROABS 18.4*  HGB 14.1  HCT 46.2*  MCV 96.3  PLT 093   Basic Metabolic Panel: Recent Labs  Lab 03/06/22 1638  NA 132*  K 4.7  CL 98  CO2 21*  GLUCOSE 188*  BUN 11  CREATININE 0.93  CALCIUM 9.5   GFR: Estimated Creatinine Clearance: 59.9 mL/min (by C-G formula based on SCr of 0.93 mg/dL). Liver Function Tests: Recent Labs  Lab 03/06/22 1638  AST 38  ALT 18  ALKPHOS 102  BILITOT 1.6*  PROT 8.7*  ALBUMIN 4.6   No results for input(s): "LIPASE", "AMYLASE" in the last 168 hours. No results for input(s): "AMMONIA" in the last 168 hours. Coagulation Profile: No results for input(s): "INR", "PROTIME" in the last 168 hours. BNP (last 3 results) No results for  input(s): "PROBNP" in the last 8760 hours. HbA1C: No results for input(s): "HGBA1C" in the last 72 hours. CBG: No results for input(s): "GLUCAP" in the last 168 hours. Lipid Profile: No results for input(s): "CHOL", "HDL", "LDLCALC", "TRIG", "CHOLHDL", "LDLDIRECT" in the last 72 hours. Thyroid Function Tests: No results for input(s): "TSH", "T4TOTAL", "FREET4", "T3FREE", "THYROIDAB" in the last 72 hours. Sepsis Labs: No results for input(s): "PROCALCITON", "LATICACIDVEN" in the last 168 hours.  No results found for this or any previous visit (from the past 240 hour(s)).  Antimicrobials: Anti-infectives (From admission, onward)    None      Culture/Microbiology No results found for: "SDES", "SPECREQUEST", "CULT", "REPTSTATUS"  Other culture-see note  Radiology Studies: MR THORACIC SPINE W WO CONTRAST  Result Date: 03/07/2022 CLINICAL DATA:  Metastatic disease evaluation EXAM: MRI THORACIC AND LUMBAR SPINE WITHOUT AND WITH CONTRAST TECHNIQUE: Multiplanar and multiecho pulse sequences of the thoracic and lumbar spine were obtained without and with intravenous  contrast. CONTRAST:  60mL GADAVIST GADOBUTROL 1 MMOL/ML IV SOLN COMPARISON:  No prior MRI, correlation is made with same day CT chest abdomen pelvis. FINDINGS: MRI THORACIC SPINE FINDINGS Alignment: S shaped curvature of the thoracolumbar spine. 3 mm anterolisthesis of C7 on T1. No significant listhesis in the thoracic spine. Vertebrae: Abnormal marrow signal in the T1 vertebral body, extending into the pedicles; in the spinous process and left pedicle of T8 (series 17, images 8 and 11); in the posterior aspect of T9 and in the posterior elements, described in more detail below; at the superior aspect of T10 and in the spinous process (series 17, images 10 and 11). The majority of the T9 vertebral body is occupied by an expansile lesion, which extends into the right greater than left posterior elements and proximal right ninth and tenth ribs (series 30, image 32), as well as likely the right eighth rib. This circumferentially narrows the spinal canal, causing severe spinal canal stenosis posterior to the T9 vertebral body, as well as moderate right neural foraminal narrowing at T8-T9 and severe right neural foraminal narrowing at T9-T10. While the majority of the aforementioned lesions do not demonstrate significant enhancement, the T9 lesion and its components in the right ninth and tenth ribs do enhance. Cord: Deformation of the spinal cord posterior to the T9 vertebral body. No abnormal spinal cord signal. Otherwise normal morphology. Paraspinal and other soft tissues: No acute finding. Hepatic cysts, which are better evaluate CT. Disc levels: C6-C7: Moderate spinal canal stenosis. C7-T1: Mild-to-moderate spinal canal stenosis. T8-T9: No spinal canal stenosis. Moderate right neural foraminal narrowing. T9: Severe spinal canal stenosis. T9-T10: Mild to moderate spinal canal stenosis. Severe right neural foraminal narrowing. MRI LUMBAR SPINE FINDINGS Segmentation:  5 lumbar type vertebral bodies. Alignment: S shaped  curvature of the thoracolumbar spine. Trace anterolisthesis of L3 on L4. Vertebrae: Abnormal marrow signal in the left posterior aspect of L3, involving the anterior aspect of the pedicle (series 23, image 11). Abnormal signal involving the right greater than left posterior elements of L4 (series 23, images 5-12). Abnormal signal involving the majority of the L5 vertebral body. The pedicles do not appear to be significantly involved, although there is some abnormal signal in the right inferior to killer facet (series 23, image 7). Conus medullaris: Extends to the L2 level and appears normal. No definite abnormal enhancement. Several nerve roots at the level of L5-S1 demonstrate mildly increased T1 signal (series 27, image 31). Paraspinal and other soft tissues: Indeterminate right renal mass, which is  better evaluated on the same day CT abdomen pelvis. Disc levels: T12-L1: No significant disc bulge. No spinal canal stenosis or neural foraminal narrowing. L1-L2: No significant disc bulge. Mild facet arthropathy. No spinal canal stenosis. Mild right neural foraminal narrowing. L2-L3: Mild disc bulge. Mild facet arthropathy. Ligamentum flavum hypertrophy. Borderline spinal canal stenosis. Mild right neural foraminal narrowing. L3-L4: Trace anterolisthesis with mild disc bulge and disc unroofing. Mild-to-moderate facet arthropathy. Ligamentum flavum hypertrophy. Narrowing of the lateral recesses. No spinal canal stenosis or neural foraminal narrowing. L4-L5: Mild disc bulge. Mild facet arthropathy. Ligamentum flavum hypertrophy. Narrowing of the lateral recesses. No spinal canal stenosis. No neural foraminal narrowing. Disc height loss with right eccentric disc osteophyte complex. No spinal canal stenosis. No definite neural foraminal narrowing. L5-S1: No significant disc bulge. No spinal canal stenosis or neural foraminal narrowing. IMPRESSION: 1. Multifocal osseous metastatic disease in the thoracic and lumbar spine,  with the most significant, expansile lesion occupying the majority of the T9 vertebral body and extending into the right greater than left posterior elements and proximal right ninth and tenth ribs. This causes severe spinal canal stenosis, with deformation of the spinal cord but no abnormal cord signal, as well as mild to moderate spinal canal stenosis at T9-T10, moderate neural foraminal narrowing T8-T9, and severe right neural foraminal narrowing at T9-T10. 2. Additional metastatic disease is seen at T1, T8, T10, L3, L4, and L5, as described above. 3. C6-C7 moderate spinal canal stenosis. 4. C7-T1 mild-to-moderate spinal canal stenosis. 5. L3-L4 and L4-L5 narrowing of the lateral recesses, which could affect the descending L4 and L5 nerve roots, respectively. 6. L1-L2 and L2-L3 mild right neural foraminal narrowing. 7. Indeterminate right renal mass, which is better evaluated on the same day CT abdomen pelvis. Electronically Signed   By: Merilyn Baba M.D.   On: 03/07/2022 00:03   MR Lumbar Spine W Wo Contrast  Result Date: 03/07/2022 CLINICAL DATA:  Metastatic disease evaluation EXAM: MRI THORACIC AND LUMBAR SPINE WITHOUT AND WITH CONTRAST TECHNIQUE: Multiplanar and multiecho pulse sequences of the thoracic and lumbar spine were obtained without and with intravenous contrast. CONTRAST:  37mL GADAVIST GADOBUTROL 1 MMOL/ML IV SOLN COMPARISON:  No prior MRI, correlation is made with same day CT chest abdomen pelvis. FINDINGS: MRI THORACIC SPINE FINDINGS Alignment: S shaped curvature of the thoracolumbar spine. 3 mm anterolisthesis of C7 on T1. No significant listhesis in the thoracic spine. Vertebrae: Abnormal marrow signal in the T1 vertebral body, extending into the pedicles; in the spinous process and left pedicle of T8 (series 17, images 8 and 11); in the posterior aspect of T9 and in the posterior elements, described in more detail below; at the superior aspect of T10 and in the spinous process (series 17,  images 10 and 11). The majority of the T9 vertebral body is occupied by an expansile lesion, which extends into the right greater than left posterior elements and proximal right ninth and tenth ribs (series 30, image 32), as well as likely the right eighth rib. This circumferentially narrows the spinal canal, causing severe spinal canal stenosis posterior to the T9 vertebral body, as well as moderate right neural foraminal narrowing at T8-T9 and severe right neural foraminal narrowing at T9-T10. While the majority of the aforementioned lesions do not demonstrate significant enhancement, the T9 lesion and its components in the right ninth and tenth ribs do enhance. Cord: Deformation of the spinal cord posterior to the T9 vertebral body. No abnormal spinal cord signal. Otherwise normal morphology.  Paraspinal and other soft tissues: No acute finding. Hepatic cysts, which are better evaluate CT. Disc levels: C6-C7: Moderate spinal canal stenosis. C7-T1: Mild-to-moderate spinal canal stenosis. T8-T9: No spinal canal stenosis. Moderate right neural foraminal narrowing. T9: Severe spinal canal stenosis. T9-T10: Mild to moderate spinal canal stenosis. Severe right neural foraminal narrowing. MRI LUMBAR SPINE FINDINGS Segmentation:  5 lumbar type vertebral bodies. Alignment: S shaped curvature of the thoracolumbar spine. Trace anterolisthesis of L3 on L4. Vertebrae: Abnormal marrow signal in the left posterior aspect of L3, involving the anterior aspect of the pedicle (series 23, image 11). Abnormal signal involving the right greater than left posterior elements of L4 (series 23, images 5-12). Abnormal signal involving the majority of the L5 vertebral body. The pedicles do not appear to be significantly involved, although there is some abnormal signal in the right inferior to killer facet (series 23, image 7). Conus medullaris: Extends to the L2 level and appears normal. No definite abnormal enhancement. Several nerve roots  at the level of L5-S1 demonstrate mildly increased T1 signal (series 27, image 31). Paraspinal and other soft tissues: Indeterminate right renal mass, which is better evaluated on the same day CT abdomen pelvis. Disc levels: T12-L1: No significant disc bulge. No spinal canal stenosis or neural foraminal narrowing. L1-L2: No significant disc bulge. Mild facet arthropathy. No spinal canal stenosis. Mild right neural foraminal narrowing. L2-L3: Mild disc bulge. Mild facet arthropathy. Ligamentum flavum hypertrophy. Borderline spinal canal stenosis. Mild right neural foraminal narrowing. L3-L4: Trace anterolisthesis with mild disc bulge and disc unroofing. Mild-to-moderate facet arthropathy. Ligamentum flavum hypertrophy. Narrowing of the lateral recesses. No spinal canal stenosis or neural foraminal narrowing. L4-L5: Mild disc bulge. Mild facet arthropathy. Ligamentum flavum hypertrophy. Narrowing of the lateral recesses. No spinal canal stenosis. No neural foraminal narrowing. Disc height loss with right eccentric disc osteophyte complex. No spinal canal stenosis. No definite neural foraminal narrowing. L5-S1: No significant disc bulge. No spinal canal stenosis or neural foraminal narrowing. IMPRESSION: 1. 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 This causes severe spinal canal stenosis, with deformation of the spinal cord but no abnormal cord signal, as well as mild to moderate spinal canal stenosis at T9-T10, moderate neural foraminal narrowing T8-T9, and severe right neural foraminal narrowing at T9-T10. 2. Additional metastatic disease is seen at T1, T8, T10, L3, L4, and L5, as described above. 3. C6-C7 moderate spinal canal stenosis. 4. C7-T1 mild-to-moderate spinal canal stenosis. 5.  L3-L4 and L4-L5 narrowing of the lateral recesses, which could affect the descending L4 and L5 nerve roots, respectively. 6. L1-L2 and L2-L3 mild right neural foraminal narrowing. 7. Indeterminate right renal mass, which is better evaluated on the same day CT abdomen pelvis. Electronically Signed   By: Merilyn Baba M.D.   On: 03/07/2022 00:03   CT Angio Chest Pulmonary Embolism (PE) W or WO Contrast  Result Date: 03/06/2022 CLINICAL DATA:  Shortness of breath with chest pain. Acute abdominal pain. History of non-small cell lung cancer status post right lobectomy. EXAM: CT ANGIOGRAPHY CHEST CT ABDOMEN AND PELVIS WITH CONTRAST TECHNIQUE: Multidetector CT imaging of the chest was performed using the standard protocol during bolus administration of intravenous contrast. Multiplanar CT image reconstructions and MIPs were obtained to evaluate the vascular anatomy. Multidetector CT imaging of the abdomen and pelvis  was performed using the standard protocol during bolus administration of intravenous contrast. RADIATION DOSE REDUCTION: This exam was performed according to the departmental dose-optimization program which includes automated exposure control, adjustment of the mA and/or kV according to patient size and/or use of iterative reconstruction technique. CONTRAST:  125mL OMNIPAQUE IOHEXOL 350 MG/ML SOLN COMPARISON:  MRI abdomen 12/02/2021. CT chest 08/13/2021. chest CT 02/24/2020. FINDINGS: CTA CHEST FINDINGS Cardiovascular: Satisfactory opacification of the pulmonary arteries to the segmental level. No evidence of pulmonary embolism. Normal heart size. No pericardial effusion. Mediastinum/Nodes: No enlarged mediastinal, hilar, or axillary lymph nodes. Thyroid gland, trachea, and esophagus demonstrate no significant findings. Lungs/Pleura: Right upper lobectomy changes are again seen. There is stable scarring and bronchiectasis in the right upper lobe. There is some scattered peripheral reticular opacities which  are nonspecific throughout both lower lungs. There is no focal lung infiltrate, pleural effusion or pneumothorax. There is a 3 mm nodule in the left upper lobe image 5/1 which is unchanged from most recent prior. This is new compared to 2021. No new pulmonary nodules are seen. No pleural effusion or pneumothorax. Musculoskeletal: There is new metastatic disease in the posterior elements T9, right greater than left, extending into the posterior right ninth rib. There is soft tissue extension into the central spinal canal causing central spinal canal stenosis. There are healed posterior right rib fractures. Metastatic osseous lesion in the T1 vertebral body has increased in size now measuring 14 mm (previously 8 mm). Review of the MIP images confirms the above findings. CT ABDOMEN and PELVIS FINDINGS Hepatobiliary: Scattered rounded hypodense lesions in the liver are unchanged and favored as small cysts. No new liver lesions. Gallbladder and bile ducts are within normal limits. Pancreas: Unremarkable. No pancreatic ductal dilatation or surrounding inflammatory changes. Spleen: Normal in size without focal abnormality. Adrenals/Urinary Tract: Rounded right renal mass measuring 2.2 cm is unchanged. Otherwise, adrenal glands, kidneys and bladder are within normal limits. Stomach/Bowel: Stomach is within normal limits. Appendix appears normal. No evidence of bowel wall thickening, distention, or inflammatory changes. There is a large amount of stool throughout the colon. Vascular/Lymphatic: Aortic atherosclerosis. No enlarged abdominal or pelvic lymph nodes. Reproductive: Status post hysterectomy. No adnexal masses. Other: No ascites or free air. Musculoskeletal: Sclerotic osseous metastatic disease has increased in the spine, most significantly at the L5 level measuring up to 4.5 cm (previously 3.1 cm). No acute fractures are seen. Review of the MIP images confirms the above findings. IMPRESSION: 1. No evidence for  pulmonary embolism. 2. Stable right upper lobectomy changes. 3. Stable 3 mm left upper lobe pulmonary nodule. 4. New metastatic disease in the posterior elements of T9 with soft tissue extension into the central spinal canal causing central spinal canal stenosis. Correlate for signs of cord compression. This can be further evaluated with MRI. 5. Worsening osseous metastatic disease. 6. No acute localizing process in the abdomen or pelvis. 7. Stable right renal mass worrisome for renal cell carcinoma. Aortic Atherosclerosis (ICD10-I70.0). Electronically Signed   By: Ronney Asters M.D.   On: 03/06/2022 20:14   CT ABDOMEN PELVIS W CONTRAST  Result Date: 03/06/2022 CLINICAL DATA:  Shortness of breath with chest pain. Acute abdominal pain. History of non-small cell lung cancer status post right lobectomy. EXAM: CT ANGIOGRAPHY CHEST CT ABDOMEN AND PELVIS WITH CONTRAST TECHNIQUE: Multidetector CT imaging of the chest was performed using the standard protocol during bolus administration of intravenous contrast. Multiplanar CT image reconstructions and MIPs were obtained to evaluate the vascular anatomy. Multidetector  CT imaging of the abdomen and pelvis was performed using the standard protocol during bolus administration of intravenous contrast. RADIATION DOSE REDUCTION: This exam was performed according to the departmental dose-optimization program which includes automated exposure control, adjustment of the mA and/or kV according to patient size and/or use of iterative reconstruction technique. CONTRAST:  156mL OMNIPAQUE IOHEXOL 350 MG/ML SOLN COMPARISON:  MRI abdomen 12/02/2021. CT chest 08/13/2021. chest CT 02/24/2020. FINDINGS: CTA CHEST FINDINGS Cardiovascular: Satisfactory opacification of the pulmonary arteries to the segmental level. No evidence of pulmonary embolism. Normal heart size. No pericardial effusion. Mediastinum/Nodes: No enlarged mediastinal, hilar, or axillary lymph nodes. Thyroid gland, trachea,  and esophagus demonstrate no significant findings. Lungs/Pleura: Right upper lobectomy changes are again seen. There is stable scarring and bronchiectasis in the right upper lobe. There is some scattered peripheral reticular opacities which are nonspecific throughout both lower lungs. There is no focal lung infiltrate, pleural effusion or pneumothorax. There is a 3 mm nodule in the left upper lobe image 5/1 which is unchanged from most recent prior. This is new compared to 2021. No new pulmonary nodules are seen. No pleural effusion or pneumothorax. Musculoskeletal: There is new metastatic disease in the posterior elements T9, right greater than left, extending into the posterior right ninth rib. There is soft tissue extension into the central spinal canal causing central spinal canal stenosis. There are healed posterior right rib fractures. Metastatic osseous lesion in the T1 vertebral body has increased in size now measuring 14 mm (previously 8 mm). Review of the MIP images confirms the above findings. CT ABDOMEN and PELVIS FINDINGS Hepatobiliary: Scattered rounded hypodense lesions in the liver are unchanged and favored as small cysts. No new liver lesions. Gallbladder and bile ducts are within normal limits. Pancreas: Unremarkable. No pancreatic ductal dilatation or surrounding inflammatory changes. Spleen: Normal in size without focal abnormality. Adrenals/Urinary Tract: Rounded right renal mass measuring 2.2 cm is unchanged. Otherwise, adrenal glands, kidneys and bladder are within normal limits. Stomach/Bowel: Stomach is within normal limits. Appendix appears normal. No evidence of bowel wall thickening, distention, or inflammatory changes. There is a large amount of stool throughout the colon. Vascular/Lymphatic: Aortic atherosclerosis. No enlarged abdominal or pelvic lymph nodes. Reproductive: Status post hysterectomy. No adnexal masses. Other: No ascites or free air. Musculoskeletal: Sclerotic osseous  metastatic disease has increased in the spine, most significantly at the L5 level measuring up to 4.5 cm (previously 3.1 cm). No acute fractures are seen. Review of the MIP images confirms the above findings. IMPRESSION: 1. No evidence for pulmonary embolism. 2. Stable right upper lobectomy changes. 3. Stable 3 mm left upper lobe pulmonary nodule. 4. New metastatic disease in the posterior elements of T9 with soft tissue extension into the central spinal canal causing central spinal canal stenosis. Correlate for signs of cord compression. This can be further evaluated with MRI. 5. Worsening osseous metastatic disease. 6. No acute localizing process in the abdomen or pelvis. 7. Stable right renal mass worrisome for renal cell carcinoma. Aortic Atherosclerosis (ICD10-I70.0). Electronically Signed   By: Ronney Asters M.D.   On: 03/06/2022 20:14   DG Chest 2 View  Result Date: 03/06/2022 CLINICAL DATA:  Chest pain. EXAM: CHEST - 2 VIEW COMPARISON:  06/15/2020 FINDINGS: The cardiac silhouette, mediastinal and hilar contours are within normal limits and stable. Stable tortuosity of the thoracic aorta. Chronic emphysematous changes and remote surgical changes involving the right lung. Probable radiation changes also. No acute pulmonary findings or worrisome pulmonary lesions. IMPRESSION: Chronic lung  changes but no acute pulmonary findings. Electronically Signed   By: Marijo Sanes M.D.   On: 03/06/2022 17:20     LOS: 0 days   Antonieta Pert, MD [facility]  03/07/2022, 12:22 PM

## 2022-03-07 NOTE — Hospital Course (Addendum)
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 in [place] in 2021, known renal mass since 2021 has been slowly growing, most recent MRI in August 2023 showed growing in size 2.4 X2.3X 2.6 lateral lower right kidney concerning for possible malignancy, also had enhancement of right side of T9 and L5 for bony mets and is scheduled for PET/CT on 03/11/2022, hypertension, presented w/ worsening right-sided rib pain has been ongoing for the last several months,was recently seen by her PCP that stopped her gabapentin and switched her to Lyrica due to continued pain  Seen in the ED, afebrile initial BP high up to 973 systolic, labs with leukocytosis hyponatremia mildly elevated TB.  CTA chest was negative for PE-showed T9 mets with some soft tissue extension into the central spinal canal causing central spinal canal stenosis. Patient ble to walk but does complain bitterly about her right-sided rib pain. [facility] contacted for admission due to uncontrolled pain.  MRI T-L spine done showed "Multifocal osseous metastatic disease in the thoracic and lumbar spine, with the most significant, expansile lesion occupying the majority of the T9 vertebral body and extending into the right greater than left posterior elements and proximal right ninth and tenth ribs. This causes severe spinal canal stenosis, with deformation of the spinal cord but no abnormal cord signal, as well as mild to moderate spinal canal stenosis at T9-T10, moderate neural foraminal narrowing T8-T9, and severe right neural foraminal narrowing at T9-T10.Additional metastatic disease is seen at T1, T8, T10, L3, L4, and L5, as described above. C6-C7 moderate spinal canal stenosis. C7-T1 mild-to-moderate spinal canal stenosis. L3-L4 and L4-L5 narrowing of the lateral recesses, which could affect the descending L4 and L5 nerve roots, respectively. L1-L2 and L2-L3 mild right neural foraminal narrowing. Indeterminate right renal  mass, which is better evaluated on the same day CT abdomen pelvis"

## 2022-03-07 NOTE — Consult Note (Signed)
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 and right renal mass It is not clear to me whether the cancer in the bone is from prior history of lung cancer versus metastatic cancer from her kidney I recommend IR to evaluate and to biopsy one of the bone lesions I recommend MRI of the brain to complete staging I recommend radiation oncology consult for palliative radiation therapy to painful bone lesions  Cancer associated pain Due to metastatic disease to the bone I will start her on dexamethasone The risk, benefits, side effects were discussed and she is in agreement Recommend radiation oncology consult  Discharge planning It is not likely she will leave this weekend due to severe uncontrolled cancer associated pain I will get Dr. Lorenso Courier to follow this weekend.   The total time spent in the appointment was 80 minutes encounter with patients including review of chart and various tests results, discussions about plan of care and coordination of care plan   All questions were answered. The patient knows to call the clinic with any problems, questions or concerns. No barriers to learning was detected.  Heath Lark, MD 11/23/20239:50 AM  CHIEF COMPLAINTS/PURPOSE OF CONSULTATION:  Metastatic disease to the bone, prior history of resected lung cancer, undiagnosed abnormal right renal mass  HISTORY OF PRESENTING ILLNESS:  The patient was last seen by Dr. Julien Nordmann in the summer to follow-up on resected lung cancer.  The patient did not receive adjuvant treatment and was being observed Her last CT imaging of the chest dated 08/13/2021 did not show any signs of cancer recurrence On October 04, 2021, she underwent CT imaging of the abdomen and pelvis and was found to have abnormal renal  mass.  On the imaging scan daily, sclerotic bone lesion was noted. Subsequently, MRI of the pelvis was performed on August 22 which showed abnormal lesion on the right kidney suspicious for malignancy.  She was noted to have enhancing bone lesions and PET/CT imaging was recommended It is not clear what happened between August to November.  She was seen by pulmonologist on November 10 for evaluation of history of lung cancer.  She was complaining of rib pain at that time.  PET/CT imaging was arranged by pulmonologist for evaluation of the abnormalities seen. The PET scan was actually scheduled for next week. However, due to uncontrolled pain, shortness of breath and chest pain, she presented to the emergency department yesterday for evaluation.  She had extensive evaluation including CT pulmonary angiogram, CT abdomen and pelvis and blood work All her imaging studies came back grossly abnormal leading to MRI.  She was admitted for pain control  She denies hematuria.  She has chest pain on minimal exertion.  She has diffuse bone pain.  She denies neurological deficits  MEDICAL HISTORY:  Past Medical History:  Diagnosis Date   Anemia    as a child only   Coronary artery calcification of native artery    Hyperlipidemia    Hypertension    Liver spots    " per pt"   Lung nodule    Wears glasses    Wears partial dentures     SURGICAL HISTORY: Past Surgical History:  Procedure Laterality Date   ABDOMINAL HYSTERECTOMY  1974   heavy bleeding   ABDOMINAL HYSTERECTOMY  BRONCHIAL BRUSHINGS  08/13/2019   Procedure: BRONCHIAL BRUSHINGS;  Surgeon: Garner Nash, DO;  Location: [facility];  Service: Pulmonary;;   BRONCHIAL WASHINGS  08/13/2019   Procedure: BRONCHIAL WASHINGS;  Surgeon: Garner Nash, DO;  Location: [facility];  Service: Pulmonary;;   COLONOSCOPY W/ BIOPSIES AND POLYPECTOMY     DILATION AND CURETTAGE OF UTERUS     FINE NEEDLE ASPIRATION  08/13/2019   Procedure: FINE  NEEDLE ASPIRATION (FNA) LINEAR;  Surgeon: Garner Nash, DO;  Location: [facility];  Service: Pulmonary;;   FOOT SURGERY     bilateral bunions and hammer toes   LUNG BIOPSY  08/13/2019   Procedure: LUNG BIOPSY;  Surgeon: Garner Nash, DO;  Location: [facility];  Service: Pulmonary;;   MULTIPLE TOOTH EXTRACTIONS     VIDEO BRONCHOSCOPY WITH ENDOBRONCHIAL NAVIGATION Right 08/13/2019   Procedure: VIDEO BRONCHOSCOPY WITH ENDOBRONCHIAL NAVIGATION;  Surgeon: Garner Nash, DO;  Location: [facility];  Service: Pulmonary;  Laterality: Right;   VIDEO BRONCHOSCOPY WITH ENDOBRONCHIAL ULTRASOUND Right 08/13/2019   Procedure: VIDEO BRONCHOSCOPY WITH ENDOBRONCHIAL ULTRASOUND;  Surgeon: Garner Nash, DO;  Location: [facility];  Service: Pulmonary;  Laterality: Right;    SOCIAL HISTORY: Social History   Socioeconomic History   Marital status: Divorced    Spouse name: Not on file   Number of children: 2   Years of education: Not on file   Highest education level: Not on file  Occupational History   Not on file  Tobacco Use   Smoking status: Former    Packs/day: 0.30    Years: 50.00    Total pack years: 15.00    Types: Cigarettes    Quit date: 2016    Years since quitting: 7.8   Smokeless tobacco: Never  Vaping Use   Vaping Use: Never used  Substance and Sexual Activity   Alcohol use: Not Currently    Comment: occasional   Drug use: Not Currently   Sexual activity: Not Currently  Other Topics Concern   Not on file  Social History Narrative   ** Merged History Encounter **       Lives alone.  Divoced.  Adult children- 1 living, 1 deceased.   Social Determinants of Health   Financial Resource Strain: Not on file  Food Insecurity: Not on file  Transportation Needs: Not on file  Physical Activity: Not on file  Stress: Not on file  Social Connections: Not on file  Intimate Partner Violence: Not on file    FAMILY HISTORY: Family History  Problem Relation Age of  Onset   Heart disease Mother 104       bypass at age    Hypertension Mother    Pneumonia Brother    Cancer Paternal Aunt    Diabetes Neg Hx    Stroke Neg Hx     ALLERGIES:  is allergic to crab [shellfish allergy] and morphine and related.  MEDICATIONS:  Current Facility-Administered Medications  Medication Dose Route Frequency Provider Last Rate Last Admin   acetaminophen (TYLENOL) tablet 650 mg  650 mg Oral Q6H PRN Kristopher Oppenheim, DO       Or   acetaminophen (TYLENOL) suppository 650 mg  650 mg Rectal Q6H PRN Kristopher Oppenheim, DO       amLODipine (NORVASC) tablet 5 mg  5 mg Oral QPM Kristopher Oppenheim, DO       aspirin EC tablet 81 mg  81 mg Oral QHS Kristopher Oppenheim, DO   81 mg at  03/07/22 0000   gabapentin (NEURONTIN) capsule 600 mg  600 mg Oral TID Kristopher Oppenheim, DO   600 mg at 03/07/22 0932   heparin injection 5,000 Units  5,000 Units Subcutaneous Q8H Kristopher Oppenheim, DO   5,000 Units at 03/07/22 0503   HYDROmorphone (DILAUDID) injection 0.5-1 mg  0.5-1 mg Intravenous Q2H PRN Kristopher Oppenheim, DO       lidocaine (LIDODERM) 5 % 2 patch  2 patch Transdermal Q24H Kristopher Oppenheim, DO   2 patch at 03/06/22 2215   lisinopril (ZESTRIL) tablet 20 mg  20 mg Oral q AM Kristopher Oppenheim, DO   20 mg at 03/07/22 0932   LORazepam (ATIVAN) injection 1 mg  1 mg Intravenous PRN Kristopher Oppenheim, DO       ondansetron [facility]) tablet 4 mg  4 mg Oral Q6H PRN Kristopher Oppenheim, DO       Or   ondansetron [facility]) injection 4 mg  4 mg Intravenous Q6H PRN Kristopher Oppenheim, DO       oxyCODONE (Oxy IR/ROXICODONE) immediate release tablet 5 mg  5 mg Oral Q4H PRN Kristopher Oppenheim, DO   5 mg at 03/07/22 0932   polyethylene glycol (MIRALAX / GLYCOLAX) packet 17 g  17 g Oral Daily PRN Kc, Maren [place], MD       senna-docusate (Senokot-S) tablet 1 tablet  1 tablet Oral BID Kc, Ramesh, MD        REVIEW OF SYSTEMS:   Constitutional: Denies fevers, chills or abnormal night sweats Eyes: Denies blurriness of vision, double vision or watery eyes Ears, nose, mouth, throat, and face: Denies  mucositis or sore throat Cardiovascular: Denies palpitation, chest discomfort or lower extremity swelling Gastrointestinal:  Denies nausea, heartburn or change in bowel habits Skin: Denies abnormal skin rashes Lymphatics: Denies new lymphadenopathy or easy bruising Neurological:Denies numbness, tingling or new weaknesses Behavioral/Psych: Mood is stable, no new changes  All other systems were reviewed with the patient and are negative.  PHYSICAL EXAMINATION: ECOG PERFORMANCE STATUS: 2 - Symptomatic, <50% confined to bed  Vitals:   03/07/22 0423 03/07/22 0857  BP: (!) 155/74 128/60  Pulse: 77 70  Resp: 18 20  Temp: 98.3 F (36.8 C) 99.2 F (37.3 C)  SpO2: 93% 96%   Filed Weights   03/07/22 0035 03/07/22 0500  Weight: 191 lb 12.8 oz (87 kg) 192 lb 0.3 oz (87.1 kg)    GENERAL:alert, in mild distress from pain.  She is very uncomfortable SKIN: skin color, texture, turgor are normal, no rashes or significant lesions EYES: normal, conjunctiva are pink and non-injected, sclera clear OROPHARYNX:no exudate, no erythema and lips, buccal mucosa, and tongue normal  NECK: supple, thyroid normal size, non-tender, without nodularity LYMPH:  no palpable lymphadenopathy in the cervical, axillary or inguinal LUNGS: clear to auscultation and percussion  HEART: regular rate & rhythm and no murmurs and no lower extremity edema ABDOMEN:abdomen soft, non-tender and normal bowel sounds Musculoskeletal:no cyanosis of digits and no clubbing  PSYCH: alert & oriented x 3 with fluent speech NEURO: no focal motor/sensory deficits  LABORATORY DATA: I have personally reviewed her CT imaging as well as MRI findings I have reviewed the data as listed Lab Results  Component Value Date   WBC 20.5 (H) 03/06/2022   HGB 14.1 03/06/2022   HCT 46.2 (H) 03/06/2022   MCV 96.3 03/06/2022   PLT 362 03/06/2022   Recent Labs    08/13/21 1600 02/22/22 1222 03/06/22 1638  NA  --  138 132*  K  --  4.3 4.7  CL   --  105 98  CO2  --  28 21*  GLUCOSE  --  97 188*  BUN  --  13 11  CREATININE 1.10* 0.89 0.93  CALCIUM  --  9.6 9.5  GFRNONAA  --   --  >60  PROT  --  7.6 8.7*  ALBUMIN  --  4.3 4.6  AST  --  19 38  ALT  --  12 18  ALKPHOS  --  98 102  BILITOT  --  0.9 1.6*    RADIOGRAPHIC STUDIES: I have personally reviewed the radiological images as listed and agreed with the findings in the report. MR THORACIC SPINE W WO CONTRAST  Result Date: 03/07/2022 CLINICAL DATA:  Metastatic disease evaluation EXAM: MRI THORACIC AND LUMBAR SPINE WITHOUT AND WITH CONTRAST TECHNIQUE: Multiplanar and multiecho pulse sequences of the thoracic and lumbar spine were obtained without and with intravenous contrast. CONTRAST:  30mL GADAVIST GADOBUTROL 1 MMOL/ML IV SOLN COMPARISON:  No prior MRI, correlation is made with same day CT chest abdomen pelvis. FINDINGS: MRI THORACIC SPINE FINDINGS Alignment: S shaped curvature of the thoracolumbar spine. 3 mm anterolisthesis of C7 on T1. No significant listhesis in the thoracic spine. Vertebrae: Abnormal marrow signal in the T1 vertebral body, extending into the pedicles; in the spinous process and left pedicle of T8 (series 17, images 8 and 11); in the posterior aspect of T9 and in the posterior elements, described in more detail below; at the superior aspect of T10 and in the spinous process (series 17, images 10 and 11). The majority of the T9 vertebral body is occupied by an expansile lesion, which extends into the right greater than left posterior elements and proximal right ninth and tenth ribs (series 30, image 32), as well as likely the right eighth rib. This circumferentially narrows the spinal canal, causing severe spinal canal stenosis posterior to the T9 vertebral body, as well as moderate right neural foraminal narrowing at T8-T9 and severe right neural foraminal narrowing at T9-T10. While the majority of the aforementioned lesions do not demonstrate significant  enhancement, the T9 lesion and its components in the right ninth and tenth ribs do enhance. Cord: Deformation of the spinal cord posterior to the T9 vertebral body. No abnormal spinal cord signal. Otherwise normal morphology. Paraspinal and other soft tissues: No acute finding. Hepatic cysts, which are better evaluate CT. Disc levels: C6-C7: Moderate spinal canal stenosis. C7-T1: Mild-to-moderate spinal canal stenosis. T8-T9: No spinal canal stenosis. Moderate right neural foraminal narrowing. T9: Severe spinal canal stenosis. T9-T10: Mild to moderate spinal canal stenosis. Severe right neural foraminal narrowing. MRI LUMBAR SPINE FINDINGS Segmentation:  5 lumbar type vertebral bodies. Alignment: S shaped curvature of the thoracolumbar spine. Trace anterolisthesis of L3 on L4. Vertebrae: Abnormal marrow signal in the left posterior aspect of L3, involving the anterior aspect of the pedicle (series 23, image 11). Abnormal signal involving the right greater than left posterior elements of L4 (series 23, images 5-12). Abnormal signal involving the majority of the L5 vertebral body. The pedicles do not appear to be significantly involved, although there is some abnormal signal in the right inferior to killer facet (series 23, image 7). Conus medullaris: Extends to the L2 level and appears normal. No definite abnormal enhancement. Several nerve roots at the level of L5-S1 demonstrate mildly increased T1 signal (series 27, image 31). Paraspinal and other soft tissues: Indeterminate right renal mass, which is better evaluated on the same day CT  abdomen pelvis. Disc levels: T12-L1: No significant disc bulge. No spinal canal stenosis or neural foraminal narrowing. L1-L2: No significant disc bulge. Mild facet arthropathy. No spinal canal stenosis. Mild right neural foraminal narrowing. L2-L3: Mild disc bulge. Mild facet arthropathy. Ligamentum flavum hypertrophy. Borderline spinal canal stenosis. Mild right neural foraminal  narrowing. L3-L4: Trace anterolisthesis with mild disc bulge and disc unroofing. Mild-to-moderate facet arthropathy. Ligamentum flavum hypertrophy. Narrowing of the lateral recesses. No spinal canal stenosis or neural foraminal narrowing. L4-L5: Mild disc bulge. Mild facet arthropathy. Ligamentum flavum hypertrophy. Narrowing of the lateral recesses. No spinal canal stenosis. No neural foraminal narrowing. Disc height loss with right eccentric disc osteophyte complex. No spinal canal stenosis. No definite neural foraminal narrowing. L5-S1: No significant disc bulge. No spinal canal stenosis or neural foraminal narrowing. IMPRESSION: 1. 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 This causes severe spinal canal stenosis, with deformation of the spinal cord but no abnormal cord signal, as well as mild to moderate spinal canal stenosis at T9-T10, moderate neural foraminal narrowing T8-T9, and severe right neural foraminal narrowing at T9-T10. 2. Additional metastatic disease is seen at T1, T8, T10, L3, L4, and L5, as described above. 3. C6-C7 moderate spinal canal stenosis. 4. C7-T1 mild-to-moderate spinal canal stenosis. 5. L3-L4 and L4-L5 narrowing of the lateral recesses, which could affect the descending L4 and L5 nerve roots, respectively. 6. L1-L2 and L2-L3 mild right neural foraminal narrowing. 7. Indeterminate right renal mass, which is better evaluated on the same day CT abdomen pelvis. Electronically Signed   By: Merilyn Baba M.D.   On: 03/07/2022 00:03   MR Lumbar Spine W Wo Contrast  Result Date: 03/07/2022 CLINICAL DATA:  Metastatic disease evaluation EXAM: MRI THORACIC AND LUMBAR SPINE WITHOUT AND WITH CONTRAST TECHNIQUE: Multiplanar and multiecho pulse sequences of the  thoracic and lumbar spine were obtained without and with intravenous contrast. CONTRAST:  63mL GADAVIST GADOBUTROL 1 MMOL/ML IV SOLN COMPARISON:  No prior MRI, correlation is made with same day CT chest abdomen pelvis. FINDINGS: MRI THORACIC SPINE FINDINGS Alignment: S shaped curvature of the thoracolumbar spine. 3 mm anterolisthesis of C7 on T1. No significant listhesis in the thoracic spine. Vertebrae: Abnormal marrow signal in the T1 vertebral body, extending into the pedicles; in the spinous process and left pedicle of T8 (series 17, images 8 and 11); in the posterior aspect of T9 and in the posterior elements, described in more detail below; at the superior aspect of T10 and in the spinous process (series 17, images 10 and 11). The majority of the T9 vertebral body is occupied by an expansile lesion, which extends into the right greater than left posterior elements and proximal right ninth and tenth ribs (series 30, image 32), as well as likely the right eighth rib. This circumferentially narrows the spinal canal, causing severe spinal canal stenosis posterior to the T9 vertebral body, as well as moderate right neural foraminal narrowing at T8-T9 and severe right neural foraminal narrowing at T9-T10. While the majority of the aforementioned lesions do not demonstrate significant enhancement, the T9 lesion and its components in the right ninth and tenth ribs do enhance. Cord: Deformation of the spinal cord posterior to the T9 vertebral body. No abnormal spinal cord signal. Otherwise normal morphology. Paraspinal and other soft tissues: No acute  finding. Hepatic cysts, which are better evaluate CT. Disc levels: C6-C7: Moderate spinal canal stenosis. C7-T1: Mild-to-moderate spinal canal stenosis. T8-T9: No spinal canal stenosis. Moderate right neural foraminal narrowing. T9: Severe spinal canal stenosis. T9-T10: Mild to moderate spinal canal stenosis. Severe right neural foraminal narrowing. MRI LUMBAR SPINE  FINDINGS Segmentation:  5 lumbar type vertebral bodies. Alignment: S shaped curvature of the thoracolumbar spine. Trace anterolisthesis of L3 on L4. Vertebrae: Abnormal marrow signal in the left posterior aspect of L3, involving the anterior aspect of the pedicle (series 23, image 11). Abnormal signal involving the right greater than left posterior elements of L4 (series 23, images 5-12). Abnormal signal involving the majority of the L5 vertebral body. The pedicles do not appear to be significantly involved, although there is some abnormal signal in the right inferior to killer facet (series 23, image 7). Conus medullaris: Extends to the L2 level and appears normal. No definite abnormal enhancement. Several nerve roots at the level of L5-S1 demonstrate mildly increased T1 signal (series 27, image 31). Paraspinal and other soft tissues: Indeterminate right renal mass, which is better evaluated on the same day CT abdomen pelvis. Disc levels: T12-L1: No significant disc bulge. No spinal canal stenosis or neural foraminal narrowing. L1-L2: No significant disc bulge. Mild facet arthropathy. No spinal canal stenosis. Mild right neural foraminal narrowing. L2-L3: Mild disc bulge. Mild facet arthropathy. Ligamentum flavum hypertrophy. Borderline spinal canal stenosis. Mild right neural foraminal narrowing. L3-L4: Trace anterolisthesis with mild disc bulge and disc unroofing. Mild-to-moderate facet arthropathy. Ligamentum flavum hypertrophy. Narrowing of the lateral recesses. No spinal canal stenosis or neural foraminal narrowing. L4-L5: Mild disc bulge. Mild facet arthropathy. Ligamentum flavum hypertrophy. Narrowing of the lateral recesses. No spinal canal stenosis. No neural foraminal narrowing. Disc height loss with right eccentric disc osteophyte complex. No spinal canal stenosis. No definite neural foraminal narrowing. L5-S1: No significant disc bulge. No spinal canal stenosis or neural foraminal narrowing. IMPRESSION:  1. 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 This causes severe spinal canal stenosis, with deformation of the spinal cord but no abnormal cord signal, as well as mild to moderate spinal canal stenosis at T9-T10, moderate neural foraminal narrowing T8-T9, and severe right neural foraminal narrowing at T9-T10. 2. Additional metastatic disease is seen at T1, T8, T10, L3, L4, and L5, as described above. 3. C6-C7 moderate spinal canal stenosis. 4. C7-T1 mild-to-moderate spinal canal stenosis. 5. L3-L4 and L4-L5 narrowing of the lateral recesses, which could affect the descending L4 and L5 nerve roots, respectively. 6. L1-L2 and L2-L3 mild right neural foraminal narrowing. 7. Indeterminate right renal mass, which is better evaluated on the same day CT abdomen pelvis. Electronically Signed   By: Merilyn Baba M.D.   On: 03/07/2022 00:03   CT Angio Chest Pulmonary Embolism (PE) W or WO Contrast  Result Date: 03/06/2022 CLINICAL DATA:  Shortness of breath with chest pain. Acute abdominal pain. History of non-small cell lung cancer status post right lobectomy. EXAM: CT ANGIOGRAPHY CHEST CT ABDOMEN AND PELVIS WITH CONTRAST TECHNIQUE: Multidetector CT imaging of the chest was performed using the standard protocol during bolus administration of intravenous contrast. Multiplanar CT image reconstructions and MIPs were obtained to evaluate the vascular anatomy. Multidetector CT imaging of the abdomen and pelvis was performed using the standard protocol during  bolus administration of intravenous contrast. RADIATION DOSE REDUCTION: This exam was performed according to the departmental dose-optimization program which includes automated exposure control, adjustment of the mA and/or kV according to patient  size and/or use of iterative reconstruction technique. CONTRAST:  179mL OMNIPAQUE IOHEXOL 350 MG/ML SOLN COMPARISON:  MRI abdomen 12/02/2021. CT chest 08/13/2021. chest CT 02/24/2020. FINDINGS: CTA CHEST FINDINGS Cardiovascular: Satisfactory opacification of the pulmonary arteries to the segmental level. No evidence of pulmonary embolism. Normal heart size. No pericardial effusion. Mediastinum/Nodes: No enlarged mediastinal, hilar, or axillary lymph nodes. Thyroid gland, trachea, and esophagus demonstrate no significant findings. Lungs/Pleura: Right upper lobectomy changes are again seen. There is stable scarring and bronchiectasis in the right upper lobe. There is some scattered peripheral reticular opacities which are nonspecific throughout both lower lungs. There is no focal lung infiltrate, pleural effusion or pneumothorax. There is a 3 mm nodule in the left upper lobe image 5/1 which is unchanged from most recent prior. This is new compared to 2021. No new pulmonary nodules are seen. No pleural effusion or pneumothorax. Musculoskeletal: There is new metastatic disease in the posterior elements T9, right greater than left, extending into the posterior right ninth rib. There is soft tissue extension into the central spinal canal causing central spinal canal stenosis. There are healed posterior right rib fractures. Metastatic osseous lesion in the T1 vertebral body has increased in size now measuring 14 mm (previously 8 mm). Review of the MIP images confirms the above findings. CT ABDOMEN and PELVIS FINDINGS Hepatobiliary: Scattered rounded hypodense lesions in the liver are unchanged and favored as small cysts. No new liver lesions. Gallbladder and bile ducts are within normal limits. Pancreas: Unremarkable. No pancreatic ductal dilatation or surrounding inflammatory changes. Spleen: Normal in size without focal abnormality. Adrenals/Urinary Tract: Rounded right renal mass measuring 2.2 cm is unchanged.  Otherwise, adrenal glands, kidneys and bladder are within normal limits. Stomach/Bowel: Stomach is within normal limits. Appendix appears normal. No evidence of bowel wall thickening, distention, or inflammatory changes. There is a large amount of stool throughout the colon. Vascular/Lymphatic: Aortic atherosclerosis. No enlarged abdominal or pelvic lymph nodes. Reproductive: Status post hysterectomy. No adnexal masses. Other: No ascites or free air. Musculoskeletal: Sclerotic osseous metastatic disease has increased in the spine, most significantly at the L5 level measuring up to 4.5 cm (previously 3.1 cm). No acute fractures are seen. Review of the MIP images confirms the above findings. IMPRESSION: 1. No evidence for pulmonary embolism. 2. Stable right upper lobectomy changes. 3. Stable 3 mm left upper lobe pulmonary nodule. 4. New metastatic disease in the posterior elements of T9 with soft tissue extension into the central spinal canal causing central spinal canal stenosis. Correlate for signs of cord compression. This can be further evaluated with MRI. 5. Worsening osseous metastatic disease. 6. No acute localizing process in the abdomen or pelvis. 7. Stable right renal mass worrisome for renal cell carcinoma. Aortic Atherosclerosis (ICD10-I70.0). Electronically Signed   By: Ronney Asters M.D.   On: 03/06/2022 20:14   CT ABDOMEN PELVIS W CONTRAST  Result Date: 03/06/2022 CLINICAL DATA:  Shortness of breath with chest pain. Acute abdominal pain. History of non-small cell lung cancer status post right lobectomy. EXAM: CT ANGIOGRAPHY CHEST CT ABDOMEN AND PELVIS WITH CONTRAST TECHNIQUE: Multidetector CT imaging of the chest was performed using the standard protocol during bolus administration of intravenous contrast. Multiplanar CT image reconstructions and MIPs were obtained to evaluate the vascular anatomy. Multidetector CT imaging of the abdomen and pelvis  was performed using the standard protocol during  bolus administration of intravenous contrast. RADIATION DOSE REDUCTION: This exam was performed according to the departmental dose-optimization program which includes automated exposure control, adjustment of the mA and/or kV according to patient size and/or use of iterative reconstruction technique. CONTRAST:  158mL OMNIPAQUE IOHEXOL 350 MG/ML SOLN COMPARISON:  MRI abdomen 12/02/2021. CT chest 08/13/2021. chest CT 02/24/2020. FINDINGS: CTA CHEST FINDINGS Cardiovascular: Satisfactory opacification of the pulmonary arteries to the segmental level. No evidence of pulmonary embolism. Normal heart size. No pericardial effusion. Mediastinum/Nodes: No enlarged mediastinal, hilar, or axillary lymph nodes. Thyroid gland, trachea, and esophagus demonstrate no significant findings. Lungs/Pleura: Right upper lobectomy changes are again seen. There is stable scarring and bronchiectasis in the right upper lobe. There is some scattered peripheral reticular opacities which are nonspecific throughout both lower lungs. There is no focal lung infiltrate, pleural effusion or pneumothorax. There is a 3 mm nodule in the left upper lobe image 5/1 which is unchanged from most recent prior. This is new compared to 2021. No new pulmonary nodules are seen. No pleural effusion or pneumothorax. Musculoskeletal: There is new metastatic disease in the posterior elements T9, right greater than left, extending into the posterior right ninth rib. There is soft tissue extension into the central spinal canal causing central spinal canal stenosis. There are healed posterior right rib fractures. Metastatic osseous lesion in the T1 vertebral body has increased in size now measuring 14 mm (previously 8 mm). Review of the MIP images confirms the above findings. CT ABDOMEN and PELVIS FINDINGS Hepatobiliary: Scattered rounded hypodense lesions in the liver are unchanged and favored as small cysts. No new liver lesions. Gallbladder and bile ducts are within  normal limits. Pancreas: Unremarkable. No pancreatic ductal dilatation or surrounding inflammatory changes. Spleen: Normal in size without focal abnormality. Adrenals/Urinary Tract: Rounded right renal mass measuring 2.2 cm is unchanged. Otherwise, adrenal glands, kidneys and bladder are within normal limits. Stomach/Bowel: Stomach is within normal limits. Appendix appears normal. No evidence of bowel wall thickening, distention, or inflammatory changes. There is a large amount of stool throughout the colon. Vascular/Lymphatic: Aortic atherosclerosis. No enlarged abdominal or pelvic lymph nodes. Reproductive: Status post hysterectomy. No adnexal masses. Other: No ascites or free air. Musculoskeletal: Sclerotic osseous metastatic disease has increased in the spine, most significantly at the L5 level measuring up to 4.5 cm (previously 3.1 cm). No acute fractures are seen. Review of the MIP images confirms the above findings. IMPRESSION: 1. No evidence for pulmonary embolism. 2. Stable right upper lobectomy changes. 3. Stable 3 mm left upper lobe pulmonary nodule. 4. New metastatic disease in the posterior elements of T9 with soft tissue extension into the central spinal canal causing central spinal canal stenosis. Correlate for signs of cord compression. This can be further evaluated with MRI. 5. Worsening osseous metastatic disease. 6. No acute localizing process in the abdomen or pelvis. 7. Stable right renal mass worrisome for renal cell carcinoma. Aortic Atherosclerosis (ICD10-I70.0). Electronically Signed   By: Ronney Asters M.D.   On: 03/06/2022 20:14   DG Chest 2 View  Result Date: 03/06/2022 CLINICAL DATA:  Chest pain. EXAM: CHEST - 2 VIEW COMPARISON:  06/15/2020 FINDINGS: The cardiac silhouette, mediastinal and hilar contours are within normal limits and stable. Stable tortuosity of the thoracic aorta. Chronic emphysematous changes and remote surgical changes involving the right lung. Probable radiation  changes also. No acute pulmonary findings or worrisome pulmonary lesions. IMPRESSION: Chronic lung changes but no acute pulmonary findings.  Electronically Signed   By: Marijo Sanes M.D.   On: 03/06/2022 17:20

## 2022-03-07 NOTE — Progress Notes (Signed)
  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 This causes severe spinal canal stenosis, with deformation of the spinal cord but no abnormal cord signal, as well as mild to moderate spinal canal stenosis at T9-T10, moderate neural foraminal narrowing T8-T9, and severe right neural foraminal narrowing at T9-T10."  Pt will need heme/onc to see and and possible radiation oncology. +/- neurosurgery consult as well.   Kristopher Oppenheim, DO [facility]

## 2022-03-08 ENCOUNTER — Telehealth: Payer: Self-pay | Admitting: Pulmonary Disease

## 2022-03-08 ENCOUNTER — Observation Stay (HOSPITAL_COMMUNITY): Payer: Medicare HMO

## 2022-03-08 DIAGNOSIS — C3491 Malignant neoplasm of unspecified part of right bronchus or lung: Secondary | ICD-10-CM | POA: Diagnosis not present

## 2022-03-08 DIAGNOSIS — C799 Secondary malignant neoplasm of unspecified site: Secondary | ICD-10-CM | POA: Diagnosis not present

## 2022-03-08 DIAGNOSIS — C7951 Secondary malignant neoplasm of bone: Secondary | ICD-10-CM | POA: Diagnosis not present

## 2022-03-08 DIAGNOSIS — R0781 Pleurodynia: Secondary | ICD-10-CM | POA: Diagnosis not present

## 2022-03-08 LAB — CBC
HCT: 38.1 % (ref 36.0–46.0)
Hemoglobin: 12.2 g/dL (ref 12.0–15.0)
MCH: 29.2 pg (ref 26.0–34.0)
MCHC: 32 g/dL (ref 30.0–36.0)
MCV: 91.1 fL (ref 80.0–100.0)
Platelets: 325 10*3/uL (ref 150–400)
RBC: 4.18 MIL/uL (ref 3.87–5.11)
RDW: 12.9 % (ref 11.5–15.5)
WBC: 17.6 10*3/uL — ABNORMAL HIGH (ref 4.0–10.5)
nRBC: 0 % (ref 0.0–0.2)

## 2022-03-08 LAB — BASIC METABOLIC PANEL
Anion gap: 9 (ref 5–15)
BUN: 18 mg/dL (ref 8–23)
CO2: 23 mmol/L (ref 22–32)
Calcium: 9.3 mg/dL (ref 8.9–10.3)
Chloride: 103 mmol/L (ref 98–111)
Creatinine, Ser: 0.87 mg/dL (ref 0.44–1.00)
GFR, Estimated: >60 mL/min (ref >60)
Glucose, Bld: 131 mg/dL — ABNORMAL HIGH (ref 70–99)
Potassium: 4.5 mmol/L (ref 3.5–5.1)
Sodium: 135 mmol/L (ref 135–145)

## 2022-03-08 MED ORDER — HEPARIN SODIUM (PORCINE) 5000 UNIT/ML IJ SOLN: 5000.0000 [IU] | Freq: Three times a day (TID) | INTRAMUSCULAR | Status: DC

## 2022-03-08 MED ORDER — HEPARIN SODIUM (PORCINE) 5000 UNIT/ML IJ SOLN
5000.0000 [IU] | Freq: Three times a day (TID) | INTRAMUSCULAR | Status: DC
  Administered 2022-03-08 – 2022-03-09 (×5): 5000 [IU] via SUBCUTANEOUS
  Filled 2022-03-08 (×7): qty 1

## 2022-03-08 MED ORDER — GADOBUTROL 1 MMOL/ML IV SOLN
9.0000 mL | Freq: Once | INTRAVENOUS | Status: AC | PRN
  Administered 2022-03-08: 9 mL via INTRAVENOUS

## 2022-03-08 MED ORDER — ASPIRIN 81 MG PO TBEC
81.0000 mg | DELAYED_RELEASE_TABLET | Freq: Every day | ORAL | Status: DC
  Administered 2022-03-11 – 2022-03-15 (×5): 81 mg via ORAL
  Filled 2022-03-08 (×5): qty 1

## 2022-03-08 NOTE — TOC Initial Note (Signed)
Transition of Care [facility]) - Initial/Assessment Note    Patient Details  Name: Brandi Holmes MRN: [id number] Date of Birth: [birth year]  Transition of [facility]) CM/SW Contact:    Joaquin Courts, RN Phone Number: 03/08/2022, 8:53 AM  Clinical Narrative:                     Transition of Care Department [facility]) has reviewed patient and no TOC needs have been identified at this time. We will continue to monitor patient advancement through interdisciplinary progression rounds. If new patient transition needs arise, please place a TOC consult.

## 2022-03-08 NOTE — Progress Notes (Signed)
Mobility Specialist - Progress Note   03/08/22 1002  Mobility  Activity Ambulated with assistance in hallway  Level of Assistance Modified independent, requires aide device or extra time  Assistive Device None  Distance Ambulated (ft) 350 ft  Activity Response Tolerated well  Mobility Referral Yes  $Mobility charge 1 Mobility   Pt received in bed and agreed to mobility, pain during bed mobility. Pain subsides when standing, ambulation had no pain nor discomfort. Pt to chair with all needs met.   Roderick Pee Mobility Specialist

## 2022-03-08 NOTE — Telephone Encounter (Signed)
Spoke with pt who is currently admitted at [facility]. Pt wanted Dr. Valeta Harms to know that PET had been canceled and biopsy would be done on tumor on spine on Monday. Nothing further needed at this time.   Routing to Dr. Valeta Harms as Juluis Rainier

## 2022-03-08 NOTE — Progress Notes (Signed)
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 in [place] in 2021, known renal mass since 2021 has been slowly growing, most recent MRI in August 2023 showed growing in size 2.4 X2.3X 2.6 lateral lower right kidney concerning for possible malignancy, also had enhancement of right side of T9 and L5 for bony mets and is scheduled for PET/CT on 03/11/2022, hypertension, presented w/ worsening right-sided rib pain has been ongoing for the last several months,was recently seen by her PCP that stopped her gabapentin and switched her to Lyrica due to continued pain   Seen in the ED, afebrile initial BP high up to 607 systolic, labs with leukocytosis hyponatremia mildly elevated TB.  CTA chest was negative for PE-showed T9 mets with some soft tissue extension into the central spinal canal causing central spinal canal stenosis. Patient ble to walk but does complain bitterly about her right-sided rib pain. [facility] hospitalist contacted for admission due to uncontrolled pain.   MRI T-L spine done showed "Multifocal osseous metastatic disease in the thoracic and lumbar spine, with the most significant, expansile lesion occupying the majority of the T9 vertebral body and extending into the right greater than left posterior elements and proximal right ninth and tenth ribs. This causes severe spinal canal stenosis, with deformation of the spinal cord but no abnormal cord signal, as well as mild to moderate spinal canal stenosis at T9-T10, moderate neural foraminal narrowing T8-T9, and severe right neural foraminal narrowing at T9-T10.Additional metastatic disease is seen at T1, T8, T10, L3, L4, and L5, as described above. C6-C7 moderate spinal canal stenosis. C7-T1 mild-to-moderate spinal canal stenosis. L3-L4 and L4-L5 narrowing of the lateral recesses, which  could affect the descending L4 and L5 nerve roots, respectively. L1-L2 and L2-L3 mild right neural foraminal narrowing. Indeterminate right renal mass, which is better evaluated on the same day CT abdomen pelvis"    Subjective: 11/24 A/O x4 sitting in chair comfortably.  States that having positive nausea with her p.o. pain medication requested to be discontinued as it is affecting her ability to eat.   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 See report of MRI for detail, on Neurontin, lidocaine patch along with p.o. oxy IV Dilaudid for pain control.  -P.o. oxy is causing patient nausea which is affecting her ability to eat.  Discontinue - Continue IV Dilaudid for pain control will titrate up as required -Consulted Dr. Hilbert Odor, unclear if the cancer in the bone is from her previous lung cancer versus metastatic cancer from the kidney. -R/Rad onc consult per [facility]-contacted by [facility]-starting on dexamethasone, continue aggressive pain management, bowel regimen.   -Per Dr. Narda Rutherford oncology biopsy has been ordered but not yet performed.   Right renal mass -Worrisome for renal carcinoma-per onco   Adenocarcinoma the right lung stage II -Followed by Dr. Julien Nordmann on right lobectomy in 2021.   Uncontrolled hypertension:  -Amlodipine 5 mg daily - Lisinopril 20 mg daily  Leukocytosis:  -Chest x-ray CT scan no pneumonia,afebrile, recheck labs, check UA.  Obesity (BMI30.99 kg/m.) -Address with PCP           Mobility Assessment (last 72 hours)     Mobility Assessment     Row Name 03/08/22  1029 03/07/22 2149 03/07/22 0930 03/06/22 2340     Does patient have an order for bedrest or is patient medically unstable No - Continue assessment No - Continue assessment No - Continue assessment No - Continue assessment    What is the highest level of mobility based on the progressive mobility assessment? Level 6 (Walks independently in room and hall) - Balance while walking in room without assist - Complete Level 5 (Walks with assist in room/hall) - Balance while stepping forward/back and can walk in room with assist - Complete Level 6 (Walks independently in room and hall) - Balance while walking in room without assist - Complete Level 4 (Walks with assist in room) - Balance while marching in place and cannot step forward and back - Complete                     DVT prophylaxis: Heparin subcu Code Status:  Family Communication: Full Status is: Inpatient    Dispo: The patient is from: Home              Anticipated d/c is to: Home              Anticipated d/c date is: > 3 days              Patient currently is not medically stable to d/c.      Consultants:    Procedures/Significant Events:    I have personally reviewed and interpreted all radiology studies and my findings are as above.  VENTILATOR SETTINGS:    Cultures   Antimicrobials:    Devices    LINES / TUBES:      Continuous Infusions:   Objective: Vitals:   03/07/22 2036 03/08/22 0456 03/08/22 1028 03/08/22 1412  BP: (!) 149/70 135/79 132/81 (!) 100/55  Pulse: 81 65  67  Resp: 18 18  18   Temp: 98.6 F (37 C) 98.1 F (36.7 C)  (!) 97.5 F (36.4 C)  TempSrc: Oral Oral  Oral  SpO2: 98% 99%  99%  Weight:      Height:        Intake/Output Summary (Last 24 hours) at 03/08/2022 1847 Last data filed at 03/08/2022 1100 Gross per 24 hour  Intake --  Output 200 ml  Net -200 ml   Filed Weights   03/07/22 0035 03/07/22 0500  Weight: 87 kg 87.1 kg    Examination:  General: A/O x4,  No acute respiratory distress Eyes: negative scleral hemorrhage, negative anisocoria, negative icterus ENT: Negative Runny nose, negative gingival bleeding, Neck:  Negative scars, masses, torticollis, lymphadenopathy, JVD Lungs: Clear to auscultation bilaterally without wheezes or crackles Cardiovascular: Regular rate and rhythm without murmur gallop or rub normal S1 and S2 Abdomen: negative abdominal pain, nondistended, positive soft, bowel sounds, no rebound, no ascites, no appreciable mass Extremities: No significant cyanosis, clubbing, or edema bilateral lower extremities Skin: Negative rashes, lesions, ulcers Psychiatric:  Negative depression, negative anxiety, negative fatigue, negative mania  Central nervous system:  Cranial nerves II through XII intact, tongue/uvula midline, all extremities muscle strength 5/5, sensation intact throughout, negative dysarthria, negative expressive aphasia, negative receptive aphasia.  .     Data Reviewed: Care during the  described time interval was provided by me .  I have reviewed this patient's available data, including medical history, events of note, physical examination, and all test results as part of my evaluation.  CBC: Recent Labs  Lab 03/06/22 1638 03/08/22 0531  WBC 20.5* 17.6*  NEUTROABS 18.4*  --   HGB 14.1 12.2  HCT 46.2* 38.1  MCV 96.3 91.1  PLT 362 341   Basic Metabolic Panel: Recent Labs  Lab 03/06/22 1638 03/08/22 0531  NA 132* 135  K 4.7 4.5  CL 98 103  CO2 21* 23  GLUCOSE 188* 131*  BUN 11 18  CREATININE 0.93 0.87  CALCIUM 9.5 9.3   GFR: Estimated Creatinine Clearance: 64 mL/min (by C-G formula based on SCr of 0.87 mg/dL). Liver Function Tests: Recent Labs  Lab 03/06/22 1638  AST 38  ALT 18  ALKPHOS 102  BILITOT 1.6*  PROT 8.7*  ALBUMIN 4.6   No results for input(s): "LIPASE", "AMYLASE" in the last 168 hours. No results for input(s): "AMMONIA" in the last 168 hours. Coagulation Profile: No results  for input(s): "INR", "PROTIME" in the last 168 hours. Cardiac Enzymes: No results for input(s): "CKTOTAL", "CKMB", "CKMBINDEX", "TROPONINI" in the last 168 hours. BNP (last 3 results) No results for input(s): "PROBNP" in the last 8760 hours. HbA1C: No results for input(s): "HGBA1C" in the last 72 hours. CBG: No results for input(s): "GLUCAP" in the last 168 hours. Lipid Profile: No results for input(s): "CHOL", "HDL", "LDLCALC", "TRIG", "CHOLHDL", "LDLDIRECT" in the last 72 hours. Thyroid Function Tests: No results for input(s): "TSH", "T4TOTAL", "FREET4", "T3FREE", "THYROIDAB" in the last 72 hours. Anemia Panel: No results for input(s): "VITAMINB12", "FOLATE", "FERRITIN", "TIBC", "IRON", "RETICCTPCT" in the last 72 hours. Sepsis Labs: No results for input(s): "PROCALCITON", "LATICACIDVEN" in the last 168 hours.  No results found for this or any previous visit (from the past 240 hour(s)).       Radiology Studies: MR BRAIN W WO CONTRAST  Result Date: 03/08/2022 CLINICAL DATA:  Metastatic disease evaluation. EXAM: MRI HEAD WITHOUT AND WITH CONTRAST TECHNIQUE: Multiplanar, multiecho pulse sequences of the brain and surrounding structures were obtained without and with intravenous contrast. CONTRAST:  36mL GADAVIST GADOBUTROL 1 MMOL/ML IV SOLN COMPARISON:  Head CT 02/16/2005 FINDINGS: Brain: There is no evidence of an acute infarct, intracranial hemorrhage, mass, midline shift, or extra-axial fluid collection. There is mild cerebral atrophy. Small T2 hyperintensities in the cerebral white matter bilaterally are nonspecific but compatible with minimal chronic small vessel ischemic disease. No abnormal enhancement is identified. Vascular: Major intracranial vascular flow voids are preserved. Skull and upper cervical spine: 1.3 cm sclerotic focus in the right frontal calvarium, not felt to reflect active metastatic disease as this was also present in 2006 and is without appreciable enhancement.  Disc degeneration and asymmetric left-sided facet arthrosis at C3-4 and C4-5. Sinuses/Orbits: Unremarkable orbits. Paranasal sinuses and mastoid air cells are clear. Other: None. IMPRESSION: No evidence of intracranial metastases. Electronically Signed   By: Logan Bores M.D.   On: 03/08/2022 14:30   MR THORACIC SPINE W WO CONTRAST  Result Date: 03/07/2022 CLINICAL DATA:  Metastatic disease evaluation EXAM: MRI THORACIC AND LUMBAR SPINE WITHOUT AND WITH CONTRAST TECHNIQUE: Multiplanar and multiecho pulse sequences of the thoracic and lumbar spine were obtained without and with intravenous contrast. CONTRAST:  38mL GADAVIST GADOBUTROL 1 MMOL/ML IV SOLN COMPARISON:  No prior MRI, correlation is made with same day CT chest abdomen pelvis. FINDINGS: MRI THORACIC SPINE FINDINGS Alignment: S  shaped curvature of the thoracolumbar spine. 3 mm anterolisthesis of C7 on T1. No significant listhesis in the thoracic spine. Vertebrae: Abnormal marrow signal in the T1 vertebral body, extending into the pedicles; in the spinous process and left pedicle of T8 (series 17, images 8 and 11); in the posterior aspect of T9 and in the posterior elements, described in more detail below; at the superior aspect of T10 and in the spinous process (series 17, images 10 and 11). The majority of the T9 vertebral body is occupied by an expansile lesion, which extends into the right greater than left posterior elements and proximal right ninth and tenth ribs (series 30, image 32), as well as likely the right eighth rib. This circumferentially narrows the spinal canal, causing severe spinal canal stenosis posterior to the T9 vertebral body, as well as moderate right neural foraminal narrowing at T8-T9 and severe right neural foraminal narrowing at T9-T10. While the majority of the aforementioned lesions do not demonstrate significant enhancement, the T9 lesion and its components in the right ninth and tenth ribs do enhance. Cord: Deformation of  the spinal cord posterior to the T9 vertebral body. No abnormal spinal cord signal. Otherwise normal morphology. Paraspinal and other soft tissues: No acute finding. Hepatic cysts, which are better evaluate CT. Disc levels: C6-C7: Moderate spinal canal stenosis. C7-T1: Mild-to-moderate spinal canal stenosis. T8-T9: No spinal canal stenosis. Moderate right neural foraminal narrowing. T9: Severe spinal canal stenosis. T9-T10: Mild to moderate spinal canal stenosis. Severe right neural foraminal narrowing. MRI LUMBAR SPINE FINDINGS Segmentation:  5 lumbar type vertebral bodies. Alignment: S shaped curvature of the thoracolumbar spine. Trace anterolisthesis of L3 on L4. Vertebrae: Abnormal marrow signal in the left posterior aspect of L3, involving the anterior aspect of the pedicle (series 23, image 11). Abnormal signal involving the right greater than left posterior elements of L4 (series 23, images 5-12). Abnormal signal involving the majority of the L5 vertebral body. The pedicles do not appear to be significantly involved, although there is some abnormal signal in the right inferior to killer facet (series 23, image 7). Conus medullaris: Extends to the L2 level and appears normal. No definite abnormal enhancement. Several nerve roots at the level of L5-S1 demonstrate mildly increased T1 signal (series 27, image 31). Paraspinal and other soft tissues: Indeterminate right renal mass, which is better evaluated on the same day CT abdomen pelvis. Disc levels: T12-L1: No significant disc bulge. No spinal canal stenosis or neural foraminal narrowing. L1-L2: No significant disc bulge. Mild facet arthropathy. No spinal canal stenosis. Mild right neural foraminal narrowing. L2-L3: Mild disc bulge. Mild facet arthropathy. Ligamentum flavum hypertrophy. Borderline spinal canal stenosis. Mild right neural foraminal narrowing. L3-L4: Trace anterolisthesis with mild disc bulge and disc unroofing. Mild-to-moderate facet  arthropathy. Ligamentum flavum hypertrophy. Narrowing of the lateral recesses. No spinal canal stenosis or neural foraminal narrowing. L4-L5: Mild disc bulge. Mild facet arthropathy. Ligamentum flavum hypertrophy. Narrowing of the lateral recesses. No spinal canal stenosis. No neural foraminal narrowing. Disc height loss with right eccentric disc osteophyte complex. No spinal canal stenosis. No definite neural foraminal narrowing. L5-S1: No significant disc bulge. No spinal canal stenosis or neural foraminal narrowing. IMPRESSION: 1. 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 This causes severe spinal canal stenosis, with deformation of the spinal cord  but no abnormal cord signal, as well as mild to moderate spinal canal stenosis at T9-T10, moderate neural foraminal narrowing T8-T9, and severe right neural foraminal narrowing at T9-T10. 2. Additional metastatic disease is seen at T1, T8, T10, L3, L4, and L5, as described above. 3. C6-C7 moderate spinal canal stenosis. 4. C7-T1 mild-to-moderate spinal canal stenosis. 5. L3-L4 and L4-L5 narrowing of the lateral recesses, which could affect the descending L4 and L5 nerve roots, respectively. 6. L1-L2 and L2-L3 mild right neural foraminal narrowing. 7. Indeterminate right renal mass, which is better evaluated on the same day CT abdomen pelvis. Electronically Signed   By: Merilyn Baba M.D.   On: 03/07/2022 00:03   MR Lumbar Spine W Wo Contrast  Result Date: 03/07/2022 CLINICAL DATA:  Metastatic disease evaluation EXAM: MRI THORACIC AND LUMBAR SPINE WITHOUT AND WITH CONTRAST TECHNIQUE: Multiplanar and multiecho pulse sequences of the thoracic and lumbar spine were obtained without and with intravenous contrast. CONTRAST:  11mL GADAVIST  GADOBUTROL 1 MMOL/ML IV SOLN COMPARISON:  No prior MRI, correlation is made with same day CT chest abdomen pelvis. FINDINGS: MRI THORACIC SPINE FINDINGS Alignment: S shaped curvature of the thoracolumbar spine. 3 mm anterolisthesis of C7 on T1. No significant listhesis in the thoracic spine. Vertebrae: Abnormal marrow signal in the T1 vertebral body, extending into the pedicles; in the spinous process and left pedicle of T8 (series 17, images 8 and 11); in the posterior aspect of T9 and in the posterior elements, described in more detail below; at the superior aspect of T10 and in the spinous process (series 17, images 10 and 11). The majority of the T9 vertebral body is occupied by an expansile lesion, which extends into the right greater than left posterior elements and proximal right ninth and tenth ribs (series 30, image 32), as well as likely the right eighth rib. This circumferentially narrows the spinal canal, causing severe spinal canal stenosis posterior to the T9 vertebral body, as well as moderate right neural foraminal narrowing at T8-T9 and severe right neural foraminal narrowing at T9-T10. While the majority of the aforementioned lesions do not demonstrate significant enhancement, the T9 lesion and its components in the right ninth and tenth ribs do enhance. Cord: Deformation of the spinal cord posterior to the T9 vertebral body. No abnormal spinal cord signal. Otherwise normal morphology. Paraspinal and other soft tissues: No acute finding. Hepatic cysts, which are better evaluate CT. Disc levels: C6-C7: Moderate spinal canal stenosis. C7-T1: Mild-to-moderate spinal canal stenosis. T8-T9: No spinal canal stenosis. Moderate right neural foraminal narrowing. T9: Severe spinal canal stenosis. T9-T10: Mild to moderate spinal canal stenosis. Severe right neural foraminal narrowing. MRI LUMBAR SPINE FINDINGS Segmentation:  5 lumbar type vertebral bodies. Alignment: S shaped curvature of the thoracolumbar  spine. Trace anterolisthesis of L3 on L4. Vertebrae: Abnormal marrow signal in the left posterior aspect of L3, involving the anterior aspect of the pedicle (series 23, image 11). Abnormal signal involving the right greater than left posterior elements of L4 (series 23, images 5-12). Abnormal signal involving the majority of the L5 vertebral body. The pedicles do not appear to be significantly involved, although there is some abnormal signal in the right inferior to killer facet (series 23, image 7). Conus medullaris: Extends to the L2 level and appears normal. No definite abnormal enhancement. Several nerve roots at the level of L5-S1 demonstrate mildly increased T1 signal (series 27, image 31). Paraspinal and other soft tissues: Indeterminate right renal mass, which is better evaluated on  the same day CT abdomen pelvis. Disc levels: T12-L1: No significant disc bulge. No spinal canal stenosis or neural foraminal narrowing. L1-L2: No significant disc bulge. Mild facet arthropathy. No spinal canal stenosis. Mild right neural foraminal narrowing. L2-L3: Mild disc bulge. Mild facet arthropathy. Ligamentum flavum hypertrophy. Borderline spinal canal stenosis. Mild right neural foraminal narrowing. L3-L4: Trace anterolisthesis with mild disc bulge and disc unroofing. Mild-to-moderate facet arthropathy. Ligamentum flavum hypertrophy. Narrowing of the lateral recesses. No spinal canal stenosis or neural foraminal narrowing. L4-L5: Mild disc bulge. Mild facet arthropathy. Ligamentum flavum hypertrophy. Narrowing of the lateral recesses. No spinal canal stenosis. No neural foraminal narrowing. Disc height loss with right eccentric disc osteophyte complex. No spinal canal stenosis. No definite neural foraminal narrowing. L5-S1: No significant disc bulge. No spinal canal stenosis or neural foraminal narrowing. IMPRESSION: 1. Multifocal osseous metastatic disease in the thoracic and lumbar spine, with the most significant,  expansile lesion occupying the majority of the T9 vertebral body and extending into the right greater than left posterior elements and proximal right ninth and tenth ribs. This causes severe spinal canal stenosis, with deformation of the spinal cord but no abnormal cord signal, as well as mild to moderate spinal canal stenosis at T9-T10, moderate neural foraminal narrowing T8-T9, and severe right neural foraminal narrowing at T9-T10. 2. Additional metastatic disease is seen at T1, T8, T10, L3, L4, and L5, as described above. 3. C6-C7 moderate spinal canal stenosis. 4. C7-T1 mild-to-moderate spinal canal stenosis. 5. L3-L4 and L4-L5 narrowing of the lateral recesses, which could affect the descending L4 and L5 nerve roots, respectively. 6. L1-L2 and L2-L3 mild right neural foraminal narrowing. 7. Indeterminate right renal mass, which is better evaluated on the same day CT abdomen pelvis. Electronically Signed   By: Merilyn Baba M.D.   On: 03/07/2022 00:03   CT Angio Chest Pulmonary Embolism (PE) W or WO Contrast  Result Date: 03/06/2022 CLINICAL DATA:  Shortness of breath with chest pain. Acute abdominal pain. History of non-small cell lung cancer status post right lobectomy. EXAM: CT ANGIOGRAPHY CHEST CT ABDOMEN AND PELVIS WITH CONTRAST TECHNIQUE: Multidetector CT imaging of the chest was performed using the standard protocol during bolus administration of intravenous contrast. Multiplanar CT image reconstructions and MIPs were obtained to evaluate the vascular anatomy. Multidetector CT imaging of the abdomen and pelvis was performed using the standard protocol during bolus administration of intravenous contrast. RADIATION DOSE REDUCTION: This exam was performed according to the departmental dose-optimization program which includes automated exposure control, adjustment of the mA and/or kV according to patient size and/or use of iterative reconstruction technique. CONTRAST:  143mL OMNIPAQUE IOHEXOL 350 MG/ML  SOLN COMPARISON:  MRI abdomen 12/02/2021. CT chest 08/13/2021. chest CT 02/24/2020. FINDINGS: CTA CHEST FINDINGS Cardiovascular: Satisfactory opacification of the pulmonary arteries to the segmental level. No evidence of pulmonary embolism. Normal heart size. No pericardial effusion. Mediastinum/Nodes: No enlarged mediastinal, hilar, or axillary lymph nodes. Thyroid gland, trachea, and esophagus demonstrate no significant findings. Lungs/Pleura: Right upper lobectomy changes are again seen. There is stable scarring and bronchiectasis in the right upper lobe. There is some scattered peripheral reticular opacities which are nonspecific throughout both lower lungs. There is no focal lung infiltrate, pleural effusion or pneumothorax. There is a 3 mm nodule in the left upper lobe image 5/1 which is unchanged from most recent prior. This is new compared to 2021. No new pulmonary nodules are seen. No pleural effusion or pneumothorax. Musculoskeletal: There is new metastatic disease in the posterior  elements T9, right greater than left, extending into the posterior right ninth rib. There is soft tissue extension into the central spinal canal causing central spinal canal stenosis. There are healed posterior right rib fractures. Metastatic osseous lesion in the T1 vertebral body has increased in size now measuring 14 mm (previously 8 mm). Review of the MIP images confirms the above findings. CT ABDOMEN and PELVIS FINDINGS Hepatobiliary: Scattered rounded hypodense lesions in the liver are unchanged and favored as small cysts. No new liver lesions. Gallbladder and bile ducts are within normal limits. Pancreas: Unremarkable. No pancreatic ductal dilatation or surrounding inflammatory changes. Spleen: Normal in size without focal abnormality. Adrenals/Urinary Tract: Rounded right renal mass measuring 2.2 cm is unchanged. Otherwise, adrenal glands, kidneys and bladder are within normal limits. Stomach/Bowel: Stomach is within  normal limits. Appendix appears normal. No evidence of bowel wall thickening, distention, or inflammatory changes. There is a large amount of stool throughout the colon. Vascular/Lymphatic: Aortic atherosclerosis. No enlarged abdominal or pelvic lymph nodes. Reproductive: Status post hysterectomy. No adnexal masses. Other: No ascites or free air. Musculoskeletal: Sclerotic osseous metastatic disease has increased in the spine, most significantly at the L5 level measuring up to 4.5 cm (previously 3.1 cm). No acute fractures are seen. Review of the MIP images confirms the above findings. IMPRESSION: 1. No evidence for pulmonary embolism. 2. Stable right upper lobectomy changes. 3. Stable 3 mm left upper lobe pulmonary nodule. 4. New metastatic disease in the posterior elements of T9 with soft tissue extension into the central spinal canal causing central spinal canal stenosis. Correlate for signs of cord compression. This can be further evaluated with MRI. 5. Worsening osseous metastatic disease. 6. No acute localizing process in the abdomen or pelvis. 7. Stable right renal mass worrisome for renal cell carcinoma. Aortic Atherosclerosis (ICD10-I70.0). Electronically Signed   By: Ronney Asters M.D.   On: 03/06/2022 20:14   CT ABDOMEN PELVIS W CONTRAST  Result Date: 03/06/2022 CLINICAL DATA:  Shortness of breath with chest pain. Acute abdominal pain. History of non-small cell lung cancer status post right lobectomy. EXAM: CT ANGIOGRAPHY CHEST CT ABDOMEN AND PELVIS WITH CONTRAST TECHNIQUE: Multidetector CT imaging of the chest was performed using the standard protocol during bolus administration of intravenous contrast. Multiplanar CT image reconstructions and MIPs were obtained to evaluate the vascular anatomy. Multidetector CT imaging of the abdomen and pelvis was performed using the standard protocol during bolus administration of intravenous contrast. RADIATION DOSE REDUCTION: This exam was performed according  to the departmental dose-optimization program which includes automated exposure control, adjustment of the mA and/or kV according to patient size and/or use of iterative reconstruction technique. CONTRAST:  147mL OMNIPAQUE IOHEXOL 350 MG/ML SOLN COMPARISON:  MRI abdomen 12/02/2021. CT chest 08/13/2021. chest CT 02/24/2020. FINDINGS: CTA CHEST FINDINGS Cardiovascular: Satisfactory opacification of the pulmonary arteries to the segmental level. No evidence of pulmonary embolism. Normal heart size. No pericardial effusion. Mediastinum/Nodes: No enlarged mediastinal, hilar, or axillary lymph nodes. Thyroid gland, trachea, and esophagus demonstrate no significant findings. Lungs/Pleura: Right upper lobectomy changes are again seen. There is stable scarring and bronchiectasis in the right upper lobe. There is some scattered peripheral reticular opacities which are nonspecific throughout both lower lungs. There is no focal lung infiltrate, pleural effusion or pneumothorax. There is a 3 mm nodule in the left upper lobe image 5/1 which is unchanged from most recent prior. This is new compared to 2021. No new pulmonary nodules are seen. No pleural effusion or pneumothorax. Musculoskeletal: There  is new metastatic disease in the posterior elements T9, right greater than left, extending into the posterior right ninth rib. There is soft tissue extension into the central spinal canal causing central spinal canal stenosis. There are healed posterior right rib fractures. Metastatic osseous lesion in the T1 vertebral body has increased in size now measuring 14 mm (previously 8 mm). Review of the MIP images confirms the above findings. CT ABDOMEN and PELVIS FINDINGS Hepatobiliary: Scattered rounded hypodense lesions in the liver are unchanged and favored as small cysts. No new liver lesions. Gallbladder and bile ducts are within normal limits. Pancreas: Unremarkable. No pancreatic ductal dilatation or surrounding inflammatory  changes. Spleen: Normal in size without focal abnormality. Adrenals/Urinary Tract: Rounded right renal mass measuring 2.2 cm is unchanged. Otherwise, adrenal glands, kidneys and bladder are within normal limits. Stomach/Bowel: Stomach is within normal limits. Appendix appears normal. No evidence of bowel wall thickening, distention, or inflammatory changes. There is a large amount of stool throughout the colon. Vascular/Lymphatic: Aortic atherosclerosis. No enlarged abdominal or pelvic lymph nodes. Reproductive: Status post hysterectomy. No adnexal masses. Other: No ascites or free air. Musculoskeletal: Sclerotic osseous metastatic disease has increased in the spine, most significantly at the L5 level measuring up to 4.5 cm (previously 3.1 cm). No acute fractures are seen. Review of the MIP images confirms the above findings. IMPRESSION: 1. No evidence for pulmonary embolism. 2. Stable right upper lobectomy changes. 3. Stable 3 mm left upper lobe pulmonary nodule. 4. New metastatic disease in the posterior elements of T9 with soft tissue extension into the central spinal canal causing central spinal canal stenosis. Correlate for signs of cord compression. This can be further evaluated with MRI. 5. Worsening osseous metastatic disease. 6. No acute localizing process in the abdomen or pelvis. 7. Stable right renal mass worrisome for renal cell carcinoma. Aortic Atherosclerosis (ICD10-I70.0). Electronically Signed   By: Ronney Asters M.D.   On: 03/06/2022 20:14        Scheduled Meds:  amLODipine  5 mg Oral QPM   [START ON 03/11/2022] aspirin EC  81 mg Oral QHS   dexamethasone  4 mg Oral Q8H   gabapentin  600 mg Oral TID   heparin  5,000 Units Subcutaneous Q8H   lidocaine  2 patch Transdermal Q24H   lisinopril  20 mg Oral q AM   senna-docusate  1 tablet Oral BID   tiZANidine  2 mg Oral QHS   Continuous Infusions:   LOS: 0 days    Time spent:40 min    Cyann Venti, Geraldo Docker, MD [facility]   If 7PM-7AM, please contact night-coverage 03/08/2022, 6:47 PM

## 2022-03-08 NOTE — Progress Notes (Signed)
Hematology/Oncology Progress Note  Clinical Summary: 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  Interval History: -- Radiation oncology consulted yesterday -- MRI brain performed today, no evidence of intracranial disease. -- Patient notes pain is currently a 6 out of 10.  She is responding well to the IV Dilaudid and steroids. -- Patient is struggling with constipation, no bowel movement since Tuesday a.m. -- Patient reports her appetite is good with no fevers, chills, sweats, nausea vomiting or diarrhea. -- CT biopsy ordered but not yet performed.  O:  Vitals:   03/08/22 1028 03/08/22 1412  BP: 132/81 (!) 100/55  Pulse:  67  Resp:  18  Temp:  (!) 97.5 F (36.4 C)  SpO2:  99%      Latest Ref Rng & Units 03/08/2022    5:31 AM 03/06/2022    4:38 PM 02/22/2022   12:22 PM  CMP  Glucose 70 - 99 mg/dL 131  188  97   BUN 8 - 23 mg/dL 18  11  13    Creatinine 0.44 - 1.00 mg/dL 0.87  0.93  0.89   Sodium 135 - 145 mmol/L 135  132  138   Potassium 3.5 - 5.1 mmol/L 4.5  4.7  4.3   Chloride 98 - 111 mmol/L 103  98  105   CO2 22 - 32 mmol/L 23  21  28    Calcium 8.9 - 10.3 mg/dL 9.3  9.5  9.6   Total Protein 6.5 - 8.1 g/dL  8.7  7.6   Total Bilirubin 0.3 - 1.2 mg/dL  1.6  0.9   Alkaline Phos 38 - 126 U/L  102  98   AST 15 - 41 U/L  38  19   ALT 0 - 44 U/L  18  12       Latest Ref Rng & Units 03/08/2022    5:31 AM 03/06/2022    4:38 PM 02/22/2022   12:22 PM  CBC  WBC 4.0 - 10.5 K/uL 17.6  20.5  9.6   Hemoglobin 12.0 - 15.0 g/dL 12.2  14.1  12.5   Hematocrit 36.0 - 46.0 % 38.1  46.2  38.1   Platelets 150 - 400 K/uL 325  362  315.0       GENERAL: well appearing notably African-American female in NAD  SKIN: skin color, texture, turgor are normal, no rashes or significant lesions EYES: conjunctiva are  pink and non-injected, sclera clear LUNGS: clear to auscultation and percussion with normal breathing effort HEART: regular rate & rhythm and no murmurs and no lower extremity edema Musculoskeletal: no cyanosis of digits and no clubbing  PSYCH: alert & oriented x 3, fluent speech NEURO: no focal motor/sensory deficits  Assessment/Plan:  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 -- Agree with radiation oncology consult for spinal lesion.  MRI brain performed today with no evidence of intracranial disease. -- Pain is currently managed with IV Dilaudid.  Currently patient's pain 6 out of 10. -- Labs today show white blood cell count 7.6, hemoglobin 12.2, and platelets of 325  Ledell Peoples, MD Department of Hematology/Oncology [place] [facility] Phone: [phone] Pager: [phone] Email: [email or web address]

## 2022-03-08 NOTE — Consult Note (Signed)
Chief Complaint: Patient was seen in consultation today for CT guided biopsy of T9 lesion Chief Complaint  Patient presents with   Chest Pain    Referring Physician(s): Natale Lay  Supervising Physician: Mir, Sharen Heck  Patient Status: [facility] - In-pt  History of Present Illness: Brandi Holmes is a 71 y.o. 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  She was admitted to [facility] on 03/06/2022 with chest/back/rib pain.  Troponin not elevated.  CT chest showed no evidence for PE, stable right upper lobectomy changes, stable 3 mm left upper lobe pulmonary nodule, new metastatic disease in the posterior elements of T9 with soft tissue extension into the central spinal canal causing central spinal canal stenosis, worsening osseous metastatic disease, stable right renal mass worrisome for renal cell carcinoma.  MRI of the thoracic and lumbar spine 03/07/22 revealed:   1. 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 This causes severe spinal canal stenosis, with deformation of the spinal cord but no abnormal cord signal, as well as mild to moderate spinal canal stenosis at T9-T10, moderate neural foraminal narrowing T8-T9, and severe right neural foraminal narrowing at T9-T10. 2. Additional metastatic disease is seen at T1, T8, T10, L3, L4, and L5, as described above. 3. C6-C7 moderate spinal canal stenosis. 4. C7-T1 mild-to-moderate spinal canal stenosis. 5. L3-L4 and L4-L5 narrowing of the lateral recesses, which could affect the descending L4 and L5 nerve roots, respectively. 6. L1-L2 and L2-L3 mild right  neural foraminal narrowing. 7. Indeterminate right renal mass  She is afebrile, WBC 17.6, hemoglobin 12.2, platelets normal, creatinine normal, PT/INR pending.  Request now received from oncology for T9 lesion biopsy.  Past Medical History:  Diagnosis Date   Anemia    as a child only   Coronary artery calcification of native artery    Hyperlipidemia    Hypertension    Liver spots    " per pt"   Lung nodule    Wears glasses    Wears partial dentures     Past Surgical History:  Procedure Laterality Date   ABDOMINAL HYSTERECTOMY  1974   heavy bleeding   ABDOMINAL HYSTERECTOMY     BRONCHIAL BRUSHINGS  08/13/2019   Procedure: BRONCHIAL BRUSHINGS;  Surgeon: Garner Nash, DO;  Location: [place] [facility];  Service: Pulmonary;;   BRONCHIAL WASHINGS  08/13/2019   Procedure: BRONCHIAL WASHINGS;  Surgeon: Garner Nash, DO;  Location: [place] [facility];  Service: Pulmonary;;   COLONOSCOPY W/ BIOPSIES AND POLYPECTOMY     DILATION AND CURETTAGE OF UTERUS     FINE NEEDLE ASPIRATION  08/13/2019   Procedure: FINE NEEDLE ASPIRATION (FNA) LINEAR;  Surgeon: Garner Nash, DO;  Location: [place] [facility];  Service: Pulmonary;;   FOOT SURGERY     bilateral bunions and hammer toes   LUNG BIOPSY  08/13/2019   Procedure: LUNG BIOPSY;  Surgeon: Garner Nash, DO;  Location: [place] [facility];  Service: Pulmonary;;   MULTIPLE TOOTH EXTRACTIONS     VIDEO BRONCHOSCOPY WITH ENDOBRONCHIAL NAVIGATION Right 08/13/2019   Procedure: VIDEO BRONCHOSCOPY WITH ENDOBRONCHIAL NAVIGATION;  Surgeon: Garner Nash, DO;  Location: [place];  Service: Pulmonary;  Laterality: Right;  VIDEO BRONCHOSCOPY WITH ENDOBRONCHIAL ULTRASOUND Right 08/13/2019   Procedure: VIDEO BRONCHOSCOPY WITH ENDOBRONCHIAL ULTRASOUND;  Surgeon: Garner Nash, DO;  Location: [place];  Service: Pulmonary;  Laterality: Right;    Allergies: Crab [shellfish allergy] and Morphine and related  Medications: Prior to Admission medications    Medication Sig Start Date End Date Taking? Authorizing Provider  amLODipine (NORVASC) 5 MG tablet Take 1 tablet (5 mg total) by mouth every evening. Patient taking differently: Take 5 mg by mouth at bedtime. 10/09/20 03/07/22 Yes Tolia, Sunit, DO  aspirin 325 MG tablet Take 325 mg by mouth as needed for moderate pain.   Yes [provider]  aspirin EC 81 MG tablet Take 81 mg by mouth at bedtime. Swallow whole.   Yes [provider]  atorvastatin (LIPITOR) 20 MG tablet Take 1 tablet (20 mg total) by mouth at bedtime. 08/22/20 03/07/22 Yes Tolia, Sunit, DO  calcium carbonate (OSCAL) 1500 (600 Ca) MG TABS tablet Take 600 mg of elemental calcium by mouth daily. When able to remember   Yes [provider]  clobetasol ointment (TEMOVATE) 4.43 % Apply 1 application  topically as needed (rash). 06/06/21  Yes [provider]  lisinopril (ZESTRIL) 20 MG tablet Take 20 mg by mouth in the morning.   Yes [provider]  MAGNESIUM PO Take 1 tablet by mouth daily. When able to remember   Yes [provider]  nystatin-triamcinolone ointment (MYCOLOG) Apply 1 application  topically daily.   Yes [provider]  oxybutynin (DITROPAN) 5 MG tablet Take 5 mg by mouth as needed for bladder spasms.   Yes [provider]  Potassium (POTASSIMIN PO) Take 1 tablet by mouth daily. When able to remember   Yes [provider]  pregabalin (LYRICA) 50 MG capsule Take 50 mg by mouth 2 (two) times daily. 02/28/22  Yes [provider]  tiZANidine (ZANAFLEX) 2 MG tablet Take 2 mg by mouth at bedtime. 07/05/21  Yes [provider]  traMADol (ULTRAM) 50 MG tablet Take 50 mg by mouth every 6 (six) hours as needed for severe pain. Every 8 hours 02/07/20  Yes [provider]  gabapentin (NEURONTIN) 600 MG tablet Take 600 mg by mouth 3 (three) times daily. Patient not taking: Reported on 03/07/2022 07/05/21   [provider]      Family History  Problem Relation Age of Onset   Heart disease Mother 87       bypass at age    Hypertension Mother    Pneumonia Brother    Cancer Paternal Aunt    Diabetes Neg Hx    Stroke Neg Hx     Social History   Socioeconomic History   Marital status: Divorced    Spouse name: Not on file   Number of children: 2   Years of education: Not on file   Highest education level: Not on file  Occupational History   Not on file  Tobacco Use   Smoking status: Former    Packs/day: 0.30    Years: 50.00    Total pack years: 15.00    Types: Cigarettes    Quit date: 2016    Years since quitting: 7.9   Smokeless tobacco: Never  Vaping Use   Vaping Use: Never used  Substance and Sexual Activity   Alcohol use: Not Currently    Comment: occasional   Drug use: Not Currently   Sexual activity: Not Currently  Other Topics Concern   Not on  file  Social History Narrative   ** Merged History Encounter **       Lives alone.  Divoced.  Adult children- 1 living, 1 deceased.   Social Determinants of Health   Financial Resource Strain: Not on file  Food Insecurity: Not on file  Transportation Needs: Not on file  Physical Activity: Not on file  Stress: Not on file  Social Connections: Not on file      Review of Systems see above; denies fever, headache, abdominal pain, nausea, vomiting or bleeding  Vital Signs: BP 135/79 (BP Location: Left Arm)   Pulse 65   Temp 98.1 F (36.7 C) (Oral)   Resp 18   Ht 5\' 6"  (1.676 m)   Wt 192 lb 0.3 oz (87.1 kg)   SpO2 99%   BMI 30.99 kg/m      Physical Exam patient awake, alert.  Chest with distant breath sounds bilaterally.  Patient with sternal, bilat rib and upper/mid back tenderness to palpation; heart with regular rate and rhythm.  Abdomen soft, positive bowel sounds, nontender.  No lower extremity edema.  Imaging: MR THORACIC SPINE W WO CONTRAST  Result Date: 03/07/2022 CLINICAL DATA:  Metastatic disease evaluation EXAM:  MRI THORACIC AND LUMBAR SPINE WITHOUT AND WITH CONTRAST TECHNIQUE: Multiplanar and multiecho pulse sequences of the thoracic and lumbar spine were obtained without and with intravenous contrast. CONTRAST:  14mL GADAVIST GADOBUTROL 1 MMOL/ML IV SOLN COMPARISON:  No prior MRI, correlation is made with same day CT chest abdomen pelvis. FINDINGS: MRI THORACIC SPINE FINDINGS Alignment: S shaped curvature of the thoracolumbar spine. 3 mm anterolisthesis of C7 on T1. No significant listhesis in the thoracic spine. Vertebrae: Abnormal marrow signal in the T1 vertebral body, extending into the pedicles; in the spinous process and left pedicle of T8 (series 17, images 8 and 11); in the posterior aspect of T9 and in the posterior elements, described in more detail below; at the superior aspect of T10 and in the spinous process (series 17, images 10 and 11). The majority of the T9 vertebral body is occupied by an expansile lesion, which extends into the right greater than left posterior elements and proximal right ninth and tenth ribs (series 30, image 32), as well as likely the right eighth rib. This circumferentially narrows the spinal canal, causing severe spinal canal stenosis posterior to the T9 vertebral body, as well as moderate right neural foraminal narrowing at T8-T9 and severe right neural foraminal narrowing at T9-T10. While the majority of the aforementioned lesions do not demonstrate significant enhancement, the T9 lesion and its components in the right ninth and tenth ribs do enhance. Cord: Deformation of the spinal cord posterior to the T9 vertebral body. No abnormal spinal cord signal. Otherwise normal morphology. Paraspinal and other soft tissues: No acute finding. Hepatic cysts, which are better evaluate CT. Disc levels: C6-C7: Moderate spinal canal stenosis. C7-T1: Mild-to-moderate spinal canal stenosis. T8-T9: No spinal canal stenosis. Moderate right neural foraminal narrowing. T9: Severe spinal canal  stenosis. T9-T10: Mild to moderate spinal canal stenosis. Severe right neural foraminal narrowing. MRI LUMBAR SPINE FINDINGS Segmentation:  5 lumbar type vertebral bodies. Alignment: S shaped curvature of the thoracolumbar spine. Trace anterolisthesis of L3 on L4. Vertebrae: Abnormal marrow signal in the left posterior aspect of L3, involving the anterior aspect of the pedicle (series 23, image 11). Abnormal signal involving the right greater than left posterior elements of L4 (series 23, images 5-12). Abnormal signal involving the majority of the L5 vertebral body.  The pedicles do not appear to be significantly involved, although there is some abnormal signal in the right inferior to killer facet (series 23, image 7). Conus medullaris: Extends to the L2 level and appears normal. No definite abnormal enhancement. Several nerve roots at the level of L5-S1 demonstrate mildly increased T1 signal (series 27, image 31). Paraspinal and other soft tissues: Indeterminate right renal mass, which is better evaluated on the same day CT abdomen pelvis. Disc levels: T12-L1: No significant disc bulge. No spinal canal stenosis or neural foraminal narrowing. L1-L2: No significant disc bulge. Mild facet arthropathy. No spinal canal stenosis. Mild right neural foraminal narrowing. L2-L3: Mild disc bulge. Mild facet arthropathy. Ligamentum flavum hypertrophy. Borderline spinal canal stenosis. Mild right neural foraminal narrowing. L3-L4: Trace anterolisthesis with mild disc bulge and disc unroofing. Mild-to-moderate facet arthropathy. Ligamentum flavum hypertrophy. Narrowing of the lateral recesses. No spinal canal stenosis or neural foraminal narrowing. L4-L5: Mild disc bulge. Mild facet arthropathy. Ligamentum flavum hypertrophy. Narrowing of the lateral recesses. No spinal canal stenosis. No neural foraminal narrowing. Disc height loss with right eccentric disc osteophyte complex. No spinal canal stenosis. No definite neural  foraminal narrowing. L5-S1: No significant disc bulge. No spinal canal stenosis or neural foraminal narrowing. IMPRESSION: 1. 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 This causes severe spinal canal stenosis, with deformation of the spinal cord but no abnormal cord signal, as well as mild to moderate spinal canal stenosis at T9-T10, moderate neural foraminal narrowing T8-T9, and severe right neural foraminal narrowing at T9-T10. 2. Additional metastatic disease is seen at T1, T8, T10, L3, L4, and L5, as described above. 3. C6-C7 moderate spinal canal stenosis. 4. C7-T1 mild-to-moderate spinal canal stenosis. 5. L3-L4 and L4-L5 narrowing of the lateral recesses, which could affect the descending L4 and L5 nerve roots, respectively. 6. L1-L2 and L2-L3 mild right neural foraminal narrowing. 7. Indeterminate right renal mass, which is better evaluated on the same day CT abdomen pelvis. Electronically Signed   By: Merilyn Baba M.D.   On: 03/07/2022 00:03   MR Lumbar Spine W Wo Contrast  Result Date: 03/07/2022 CLINICAL DATA:  Metastatic disease evaluation EXAM: MRI THORACIC AND LUMBAR SPINE WITHOUT AND WITH CONTRAST TECHNIQUE: Multiplanar and multiecho pulse sequences of the thoracic and lumbar spine were obtained without and with intravenous contrast. CONTRAST:  12mL GADAVIST GADOBUTROL 1 MMOL/ML IV SOLN COMPARISON:  No prior MRI, correlation is made with same day CT chest abdomen pelvis. FINDINGS: MRI THORACIC SPINE FINDINGS Alignment: S shaped curvature of the thoracolumbar spine. 3 mm anterolisthesis of C7 on T1. No significant listhesis in the thoracic spine. Vertebrae: Abnormal marrow signal in the T1 vertebral body, extending into the pedicles; in the spinous process and left pedicle  of T8 (series 17, images 8 and 11); in the posterior aspect of T9 and in the posterior elements, described in more detail below; at the superior aspect of T10 and in the spinous process (series 17, images 10 and 11). The majority of the T9 vertebral body is occupied by an expansile lesion, which extends into the right greater than left posterior elements and proximal right ninth and tenth ribs (series 30, image 32), as well as likely the right eighth rib. This circumferentially narrows the spinal canal, causing severe spinal canal stenosis posterior to the  T9 vertebral body, as well as moderate right neural foraminal narrowing at T8-T9 and severe right neural foraminal narrowing at T9-T10. While the majority of the aforementioned lesions do not demonstrate significant enhancement, the T9 lesion and its components in the right ninth and tenth ribs do enhance. Cord: Deformation of the spinal cord posterior to the T9 vertebral body. No abnormal spinal cord signal. Otherwise normal morphology. Paraspinal and other soft tissues: No acute finding. Hepatic cysts, which are better evaluate CT. Disc levels: C6-C7: Moderate spinal canal stenosis. C7-T1: Mild-to-moderate spinal canal stenosis. T8-T9: No spinal canal stenosis. Moderate right neural foraminal narrowing. T9: Severe spinal canal stenosis. T9-T10: Mild to moderate spinal canal stenosis. Severe right neural foraminal narrowing. MRI LUMBAR SPINE FINDINGS Segmentation:  5 lumbar type vertebral bodies. Alignment: S shaped curvature of the thoracolumbar spine. Trace anterolisthesis of L3 on L4. Vertebrae: Abnormal marrow signal in the left posterior aspect of L3, involving the anterior aspect of the pedicle (series 23, image 11). Abnormal signal involving the right greater than left posterior elements of L4 (series 23, images 5-12). Abnormal signal involving the majority of the L5 vertebral body. The pedicles do not appear to be significantly involved, although there is  some abnormal signal in the right inferior to killer facet (series 23, image 7). Conus medullaris: Extends to the L2 level and appears normal. No definite abnormal enhancement. Several nerve roots at the level of L5-S1 demonstrate mildly increased T1 signal (series 27, image 31). Paraspinal and other soft tissues: Indeterminate right renal mass, which is better evaluated on the same day CT abdomen pelvis. Disc levels: T12-L1: No significant disc bulge. No spinal canal stenosis or neural foraminal narrowing. L1-L2: No significant disc bulge. Mild facet arthropathy. No spinal canal stenosis. Mild right neural foraminal narrowing. L2-L3: Mild disc bulge. Mild facet arthropathy. Ligamentum flavum hypertrophy. Borderline spinal canal stenosis. Mild right neural foraminal narrowing. L3-L4: Trace anterolisthesis with mild disc bulge and disc unroofing. Mild-to-moderate facet arthropathy. Ligamentum flavum hypertrophy. Narrowing of the lateral recesses. No spinal canal stenosis or neural foraminal narrowing. L4-L5: Mild disc bulge. Mild facet arthropathy. Ligamentum flavum hypertrophy. Narrowing of the lateral recesses. No spinal canal stenosis. No neural foraminal narrowing. Disc height loss with right eccentric disc osteophyte complex. No spinal canal stenosis. No definite neural foraminal narrowing. L5-S1: No significant disc bulge. No spinal canal stenosis or neural foraminal narrowing. IMPRESSION: 1. 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 This causes severe spinal canal stenosis, with deformation of the spinal cord but no abnormal cord signal, as well as mild to moderate spinal canal stenosis at T9-T10, moderate neural foraminal narrowing T8-T9, and severe right neural foraminal narrowing at  T9-T10. 2. Additional metastatic disease is seen at T1, T8, T10, L3, L4, and L5, as described above. 3. C6-C7 moderate spinal canal stenosis. 4. C7-T1 mild-to-moderate spinal canal stenosis. 5. L3-L4 and L4-L5 narrowing of the lateral recesses, which could affect the descending L4 and L5 nerve roots, respectively. 6. L1-L2 and L2-L3 mild right neural foraminal narrowing. 7. Indeterminate right renal mass, which is better evaluated on the same day CT abdomen pelvis. Electronically Signed   By: Merilyn Baba M.D.   On: 03/07/2022 00:03   CT Angio Chest Pulmonary Embolism (PE) W or WO Contrast  Result Date: 03/06/2022 CLINICAL DATA:  Shortness  of breath with chest pain. Acute abdominal pain. History of non-small cell lung cancer status post right lobectomy. EXAM: CT ANGIOGRAPHY CHEST CT ABDOMEN AND PELVIS WITH CONTRAST TECHNIQUE: Multidetector CT imaging of the chest was performed using the standard protocol during bolus administration of intravenous contrast. Multiplanar CT image reconstructions and MIPs were obtained to evaluate the vascular anatomy. Multidetector CT imaging of the abdomen and pelvis was performed using the standard protocol during bolus administration of intravenous contrast. RADIATION DOSE REDUCTION: This exam was performed according to the departmental dose-optimization program which includes automated exposure control, adjustment of the mA and/or kV according to patient size and/or use of iterative reconstruction technique. CONTRAST:  131mL OMNIPAQUE IOHEXOL 350 MG/ML SOLN COMPARISON:  MRI abdomen 12/02/2021. CT chest 08/13/2021. chest CT 02/24/2020. FINDINGS: CTA CHEST FINDINGS Cardiovascular: Satisfactory opacification of the pulmonary arteries to the segmental level. No evidence of pulmonary embolism. Normal heart size. No pericardial effusion. Mediastinum/Nodes: No enlarged mediastinal, hilar, or axillary lymph nodes. Thyroid gland, trachea, and esophagus demonstrate no significant  findings. Lungs/Pleura: Right upper lobectomy changes are again seen. There is stable scarring and bronchiectasis in the right upper lobe. There is some scattered peripheral reticular opacities which are nonspecific throughout both lower lungs. There is no focal lung infiltrate, pleural effusion or pneumothorax. There is a 3 mm nodule in the left upper lobe image 5/1 which is unchanged from most recent prior. This is new compared to 2021. No new pulmonary nodules are seen. No pleural effusion or pneumothorax. Musculoskeletal: There is new metastatic disease in the posterior elements T9, right greater than left, extending into the posterior right ninth rib. There is soft tissue extension into the central spinal canal causing central spinal canal stenosis. There are healed posterior right rib fractures. Metastatic osseous lesion in the T1 vertebral body has increased in size now measuring 14 mm (previously 8 mm). Review of the MIP images confirms the above findings. CT ABDOMEN and PELVIS FINDINGS Hepatobiliary: Scattered rounded hypodense lesions in the liver are unchanged and favored as small cysts. No new liver lesions. Gallbladder and bile ducts are within normal limits. Pancreas: Unremarkable. No pancreatic ductal dilatation or surrounding inflammatory changes. Spleen: Normal in size without focal abnormality. Adrenals/Urinary Tract: Rounded right renal mass measuring 2.2 cm is unchanged. Otherwise, adrenal glands, kidneys and bladder are within normal limits. Stomach/Bowel: Stomach is within normal limits. Appendix appears normal. No evidence of bowel wall thickening, distention, or inflammatory changes. There is a large amount of stool throughout the colon. Vascular/Lymphatic: Aortic atherosclerosis. No enlarged abdominal or pelvic lymph nodes. Reproductive: Status post hysterectomy. No adnexal masses. Other: No ascites or free air. Musculoskeletal: Sclerotic osseous metastatic disease has increased in the  spine, most significantly at the L5 level measuring up to 4.5 cm (previously 3.1 cm). No acute fractures are seen. Review of the MIP images confirms the above findings. IMPRESSION: 1. No evidence for pulmonary embolism. 2. Stable right upper lobectomy changes. 3. Stable 3 mm left upper lobe pulmonary nodule. 4. New metastatic disease in the posterior elements of T9 with soft tissue extension into the central spinal canal causing central spinal canal stenosis. Correlate for signs of cord compression. This can be further evaluated with MRI. 5. Worsening osseous metastatic disease. 6. No acute localizing process in the abdomen or pelvis. 7. Stable right renal mass worrisome for renal cell carcinoma. Aortic Atherosclerosis (ICD10-I70.0). Electronically Signed   By: Ronney Asters M.D.   On: 03/06/2022 20:14   CT ABDOMEN PELVIS W CONTRAST  Result Date: 03/06/2022 CLINICAL DATA:  Shortness of breath with chest pain. Acute abdominal pain. History of non-small cell lung cancer status post right lobectomy. EXAM: CT ANGIOGRAPHY CHEST CT ABDOMEN AND PELVIS WITH CONTRAST TECHNIQUE: Multidetector CT imaging of the chest was performed using the standard protocol during bolus administration of intravenous contrast. Multiplanar CT image reconstructions and MIPs were obtained to evaluate the vascular anatomy. Multidetector CT imaging of the abdomen and pelvis was performed using the standard protocol during bolus administration of intravenous contrast. RADIATION DOSE REDUCTION: This exam was performed according to the departmental dose-optimization program which includes automated exposure control, adjustment of the mA and/or kV according to patient size and/or use of iterative reconstruction technique. CONTRAST:  166mL OMNIPAQUE IOHEXOL 350 MG/ML SOLN COMPARISON:  MRI abdomen 12/02/2021. CT chest 08/13/2021. chest CT 02/24/2020. FINDINGS: CTA CHEST FINDINGS Cardiovascular: Satisfactory opacification of the pulmonary arteries to  the segmental level. No evidence of pulmonary embolism. Normal heart size. No pericardial effusion. Mediastinum/Nodes: No enlarged mediastinal, hilar, or axillary lymph nodes. Thyroid gland, trachea, and esophagus demonstrate no significant findings. Lungs/Pleura: Right upper lobectomy changes are again seen. There is stable scarring and bronchiectasis in the right upper lobe. There is some scattered peripheral reticular opacities which are nonspecific throughout both lower lungs. There is no focal lung infiltrate, pleural effusion or pneumothorax. There is a 3 mm nodule in the left upper lobe image 5/1 which is unchanged from most recent prior. This is new compared to 2021. No new pulmonary nodules are seen. No pleural effusion or pneumothorax. Musculoskeletal: There is new metastatic disease in the posterior elements T9, right greater than left, extending into the posterior right ninth rib. There is soft tissue extension into the central spinal canal causing central spinal canal stenosis. There are healed posterior right rib fractures. Metastatic osseous lesion in the T1 vertebral body has increased in size now measuring 14 mm (previously 8 mm). Review of the MIP images confirms the above findings. CT ABDOMEN and PELVIS FINDINGS Hepatobiliary: Scattered rounded hypodense lesions in the liver are unchanged and favored as small cysts. No new liver lesions. Gallbladder and bile ducts are within normal limits. Pancreas: Unremarkable. No pancreatic ductal dilatation or surrounding inflammatory changes. Spleen: Normal in size without focal abnormality. Adrenals/Urinary Tract: Rounded right renal mass measuring 2.2 cm is unchanged. Otherwise, adrenal glands, kidneys and bladder are within normal limits. Stomach/Bowel: Stomach is within normal limits. Appendix appears normal. No evidence of bowel wall thickening, distention, or inflammatory changes. There is a large amount of stool throughout the colon.  Vascular/Lymphatic: Aortic atherosclerosis. No enlarged abdominal or pelvic lymph nodes. Reproductive: Status post hysterectomy. No adnexal masses. Other: No ascites or free air. Musculoskeletal: Sclerotic osseous metastatic disease has increased in the spine, most significantly at the L5 level measuring up to 4.5 cm (previously 3.1 cm). No acute fractures are seen. Review of the MIP images confirms the above findings. IMPRESSION: 1. No evidence for pulmonary embolism. 2. Stable right upper lobectomy changes. 3. Stable 3 mm left upper lobe pulmonary nodule. 4. New metastatic disease in the posterior elements of T9 with soft tissue extension into the central spinal canal causing central spinal canal stenosis. Correlate for signs of cord compression. This can be further evaluated with MRI. 5. Worsening osseous metastatic disease. 6. No acute localizing process in the abdomen or pelvis. 7. Stable right renal mass worrisome for renal cell carcinoma. Aortic Atherosclerosis (ICD10-I70.0). Electronically Signed   By: Ronney Asters M.D.   On: 03/06/2022 20:14  DG Chest 2 View  Result Date: 03/06/2022 CLINICAL DATA:  Chest pain. EXAM: CHEST - 2 VIEW COMPARISON:  06/15/2020 FINDINGS: The cardiac silhouette, mediastinal and hilar contours are within normal limits and stable. Stable tortuosity of the thoracic aorta. Chronic emphysematous changes and remote surgical changes involving the right lung. Probable radiation changes also. No acute pulmonary findings or worrisome pulmonary lesions. IMPRESSION: Chronic lung changes but no acute pulmonary findings. Electronically Signed   By: Marijo Sanes M.D.   On: 03/06/2022 17:20    Labs:  CBC: Recent Labs    02/22/22 1222 03/06/22 1638 03/08/22 0531  WBC 9.6 20.5* 17.6*  HGB 12.5 14.1 12.2  HCT 38.1 46.2* 38.1  PLT 315.0 362 325    COAGS: No results for input(s): "INR", "APTT" in the last 8760 hours.  BMP: Recent Labs    08/13/21 1600 02/22/22 1222  03/06/22 1638 03/08/22 0531  NA  --  138 132* 135  K  --  4.3 4.7 4.5  CL  --  105 98 103  CO2  --  28 21* 23  GLUCOSE  --  97 188* 131*  BUN  --  13 11 18   CALCIUM  --  9.6 9.5 9.3  CREATININE 1.10* 0.89 0.93 0.87  GFRNONAA  --   --  >60 >60    LIVER FUNCTION TESTS: Recent Labs    02/22/22 1222 03/06/22 1638  BILITOT 0.9 1.6*  AST 19 38  ALT 12 18  ALKPHOS 98 102  PROT 7.6 8.7*  ALBUMIN 4.3 4.6    TUMOR MARKERS: No results for input(s): "AFPTM", "CEA", "CA199", "CHROMGRNA" in the last 8760 hours.  Assessment and Plan: 71 y.o. 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  She was admitted to [facility] on 03/06/2022 with chest/back/rib pain.  Troponin not elevated.  CT chest showed no evidence for PE, stable right upper lobectomy changes, stable 3 mm left upper lobe pulmonary nodule, new metastatic disease in the posterior elements of T9 with soft tissue extension into the central spinal canal causing central spinal canal stenosis, worsening osseous metastatic disease, stable right renal mass worrisome for renal cell carcinoma.  MRI of the thoracic and lumbar spine 03/07/22 revealed:   1. 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 This causes severe spinal canal stenosis, with deformation of the spinal cord but no abnormal cord signal, as well as mild to moderate spinal canal stenosis at T9-T10, moderate neural foraminal narrowing T8-T9, and severe right neural foraminal narrowing at T9-T10. 2. Additional metastatic disease is seen at T1, T8, T10, L3, L4, and L5, as described above. 3. C6-C7 moderate  spinal canal stenosis. 4. C7-T1 mild-to-moderate spinal canal stenosis. 5. L3-L4 and L4-L5 narrowing of the lateral recesses, which could affect the descending L4 and L5 nerve roots, respectively. 6. L1-L2 and L2-L3 mild right neural foraminal narrowing. 7. Indeterminate right renal mass  She is afebrile, WBC 17.6, hemoglobin 12.2, platelets normal, creatinine normal, PT/INR pending.  Request now received from oncology for T9 lesion biopsy.  Imaging studies were reviewed by Dr. Vernard Gambles.  Patient received subcu heparin this a.m. and is also on aspirin.  Will need to  hold aspirin until after biopsy and hold subcu heparin starting on 11/26- [facility] notified.Risks and benefits of procedure was discussed with the patient  including, but not limited to bleeding, infection, damage to adjacent structures or low yield requiring additional tests.  All of the questions were answered and there is agreement to proceed.  Consent signed and in chart.  Procedure tentatively scheduled for 11/27  Thank you for this interesting consult.  I greatly enjoyed meeting Brandi Holmes and look forward to participating in their care.  A copy of this report was sent to the requesting provider on this date.  Electronically Signed: D. Rowe Robert, PA-C 03/08/2022, 9:47 AM   I spent a total of   25 minutes  in face to face in clinical consultation, greater than 50% of which was counseling/coordinating care for CT-guided biopsy of T9 lesion

## 2022-03-09 DIAGNOSIS — Z809 Family history of malignant neoplasm, unspecified: Secondary | ICD-10-CM | POA: Diagnosis not present

## 2022-03-09 DIAGNOSIS — M4804 Spinal stenosis, thoracic region: Secondary | ICD-10-CM | POA: Diagnosis present

## 2022-03-09 DIAGNOSIS — E669 Obesity, unspecified: Secondary | ICD-10-CM | POA: Diagnosis present

## 2022-03-09 DIAGNOSIS — Z87891 Personal history of nicotine dependence: Secondary | ICD-10-CM | POA: Diagnosis not present

## 2022-03-09 DIAGNOSIS — D72829 Elevated white blood cell count, unspecified: Secondary | ICD-10-CM | POA: Diagnosis present

## 2022-03-09 DIAGNOSIS — C799 Secondary malignant neoplasm of unspecified site: Secondary | ICD-10-CM | POA: Diagnosis not present

## 2022-03-09 DIAGNOSIS — C7951 Secondary malignant neoplasm of bone: Secondary | ICD-10-CM | POA: Diagnosis present

## 2022-03-09 DIAGNOSIS — K5900 Constipation, unspecified: Secondary | ICD-10-CM | POA: Diagnosis present

## 2022-03-09 DIAGNOSIS — Z91013 Allergy to seafood: Secondary | ICD-10-CM | POA: Diagnosis not present

## 2022-03-09 DIAGNOSIS — Z7982 Long term (current) use of aspirin: Secondary | ICD-10-CM | POA: Diagnosis not present

## 2022-03-09 DIAGNOSIS — Z885 Allergy status to narcotic agent status: Secondary | ICD-10-CM | POA: Diagnosis not present

## 2022-03-09 DIAGNOSIS — R8271 Bacteriuria: Secondary | ICD-10-CM | POA: Diagnosis present

## 2022-03-09 DIAGNOSIS — R0781 Pleurodynia: Secondary | ICD-10-CM | POA: Diagnosis present

## 2022-03-09 DIAGNOSIS — E871 Hypo-osmolality and hyponatremia: Secondary | ICD-10-CM | POA: Diagnosis present

## 2022-03-09 DIAGNOSIS — G893 Neoplasm related pain (acute) (chronic): Secondary | ICD-10-CM | POA: Diagnosis present

## 2022-03-09 DIAGNOSIS — Z79899 Other long term (current) drug therapy: Secondary | ICD-10-CM | POA: Diagnosis not present

## 2022-03-09 DIAGNOSIS — M4802 Spinal stenosis, cervical region: Secondary | ICD-10-CM | POA: Diagnosis present

## 2022-03-09 DIAGNOSIS — Z902 Acquired absence of lung [part of]: Secondary | ICD-10-CM | POA: Diagnosis not present

## 2022-03-09 DIAGNOSIS — C3491 Malignant neoplasm of unspecified part of right bronchus or lung: Secondary | ICD-10-CM | POA: Diagnosis present

## 2022-03-09 DIAGNOSIS — I2510 Atherosclerotic heart disease of native coronary artery without angina pectoris: Secondary | ICD-10-CM | POA: Diagnosis present

## 2022-03-09 DIAGNOSIS — I10 Essential (primary) hypertension: Secondary | ICD-10-CM | POA: Diagnosis present

## 2022-03-09 DIAGNOSIS — Z6830 Body mass index (BMI) 30.0-30.9, adult: Secondary | ICD-10-CM | POA: Diagnosis not present

## 2022-03-09 DIAGNOSIS — Z8249 Family history of ischemic heart disease and other diseases of the circulatory system: Secondary | ICD-10-CM | POA: Diagnosis not present

## 2022-03-09 DIAGNOSIS — E785 Hyperlipidemia, unspecified: Secondary | ICD-10-CM | POA: Diagnosis present

## 2022-03-09 LAB — COMPREHENSIVE METABOLIC PANEL
ALT: 12 U/L (ref 0–44)
AST: 15 U/L (ref 15–41)
Albumin: 3.6 g/dL (ref 3.5–5.0)
Alkaline Phosphatase: 126 U/L (ref 38–126)
Anion gap: 9 (ref 5–15)
BUN: 38 mg/dL — ABNORMAL HIGH (ref 8–23)
CO2: 23 mmol/L (ref 22–32)
Calcium: 8.9 mg/dL (ref 8.9–10.3)
Chloride: 102 mmol/L (ref 98–111)
Creatinine, Ser: 1.04 mg/dL — ABNORMAL HIGH (ref 0.44–1.00)
GFR, Estimated: 57 mL/min — ABNORMAL LOW (ref >60)
Glucose, Bld: 119 mg/dL — ABNORMAL HIGH (ref 70–99)
Potassium: 4.6 mmol/L (ref 3.5–5.1)
Sodium: 134 mmol/L — ABNORMAL LOW (ref 135–145)
Total Bilirubin: 0.5 mg/dL (ref 0.3–1.2)
Total Protein: 7.4 g/dL (ref 6.5–8.1)

## 2022-03-09 LAB — CBC WITH DIFFERENTIAL/PLATELET
Abs Immature Granulocytes: 0.43 10*3/uL — ABNORMAL HIGH (ref 0.00–0.07)
Basophils Absolute: 0 10*3/uL (ref 0.0–0.1)
Basophils Relative: 0 %
Eosinophils Absolute: 0 10*3/uL (ref 0.0–0.5)
Eosinophils Relative: 0 %
HCT: 36 % (ref 36.0–46.0)
Hemoglobin: 11.5 g/dL — ABNORMAL LOW (ref 12.0–15.0)
Immature Granulocytes: 2 %
Lymphocytes Relative: 8 %
Lymphs Abs: 1.6 10*3/uL (ref 0.7–4.0)
MCH: 29.3 pg (ref 26.0–34.0)
MCHC: 31.9 g/dL (ref 30.0–36.0)
MCV: 91.6 fL (ref 80.0–100.0)
Monocytes Absolute: 1.1 10*3/uL — ABNORMAL HIGH (ref 0.1–1.0)
Monocytes Relative: 5 %
Neutro Abs: 16.2 10*3/uL — ABNORMAL HIGH (ref 1.7–7.7)
Neutrophils Relative %: 85 %
Platelets: 333 10*3/uL (ref 150–400)
RBC: 3.93 MIL/uL (ref 3.87–5.11)
RDW: 12.9 % (ref 11.5–15.5)
WBC: 19.4 10*3/uL — ABNORMAL HIGH (ref 4.0–10.5)
nRBC: 0 % (ref 0.0–0.2)

## 2022-03-09 LAB — PHOSPHORUS: Phosphorus: 4.2 mg/dL (ref 2.5–4.6)

## 2022-03-09 LAB — MAGNESIUM: Magnesium: 2.5 mg/dL — ABNORMAL HIGH (ref 1.7–2.4)

## 2022-03-09 LAB — PROTIME-INR
INR: 1 (ref 0.8–1.2)
Prothrombin Time: 12.8 seconds (ref 11.4–15.2)

## 2022-03-09 NOTE — Consult Note (Addendum)
Radiation Oncology         [phone] ________________________________  Name: Brandi Holmes MRN: [id number]  Date: 03/06/2022  DOB: Jun 30, 1950  WU:[id number], Jocelyn Lamer, MD  No ref. provider found     REFERRING PHYSICIAN: No ref. provider found   DIAGNOSIS: The primary encounter diagnosis was Metastatic malignant neoplasm, unspecified site [facility]). 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 possibly from renal mass vs lung cancer was also pertinent to this visit.   HISTORY OF PRESENT ILLNESS::Brandi Holmes is a 71 y.o. female who is seen for an initial consultation visit regarding the patient's diagnosis of metastatic cancer with bone metastasis.  The patient has a history of non-small cell lung cancer status post resection in [place].  The patient declined adjuvant chemotherapy at that time.  Recent imaging unfortunately appears to show stage IV disease with multiple metastases.  In particular, a dominant spinal tumor is noted at the T9 level with extension into the canal.  Close abutment to the spinal cord without any signal change within the cord itself.  The patient presented with significant pain in the back and rib region.  She denies any numbness, tingling, weakness, loss of bladder/bowel at this time.  She has begun steroids and I have been asked to see the patient for consideration of possible radiation treatment.    PREVIOUS RADIATION THERAPY: No   PAST MEDICAL HISTORY:  has a past medical history of Anemia, Coronary artery calcification of native artery, Hyperlipidemia, Hypertension, Liver spots, Lung nodule, Wears glasses, and Wears partial dentures.     PAST SURGICAL HISTORY: Past Surgical History:  Procedure Laterality Date   ABDOMINAL HYSTERECTOMY  1974   heavy bleeding   ABDOMINAL HYSTERECTOMY     BRONCHIAL BRUSHINGS  08/13/2019   Procedure: BRONCHIAL BRUSHINGS;  Surgeon: Josephine Igo, DO;  Location: [facility];   Service: Pulmonary;;   BRONCHIAL WASHINGS  08/13/2019   Procedure: BRONCHIAL WASHINGS;  Surgeon: Josephine Igo, DO;  Location: [facility];  Service: Pulmonary;;   COLONOSCOPY W/ BIOPSIES AND POLYPECTOMY     DILATION AND CURETTAGE OF UTERUS     FINE NEEDLE ASPIRATION  08/13/2019   Procedure: FINE NEEDLE ASPIRATION (FNA) LINEAR;  Surgeon: Josephine Igo, DO;  Location: [facility];  Service: Pulmonary;;   FOOT SURGERY     bilateral bunions and hammer toes   LUNG BIOPSY  08/13/2019   Procedure: LUNG BIOPSY;  Surgeon: Josephine Igo, DO;  Location: [facility];  Service: Pulmonary;;   MULTIPLE TOOTH EXTRACTIONS     VIDEO BRONCHOSCOPY WITH ENDOBRONCHIAL NAVIGATION Right 08/13/2019   Procedure: VIDEO BRONCHOSCOPY WITH ENDOBRONCHIAL NAVIGATION;  Surgeon: Josephine Igo, DO;  Location: [facility];  Service: Pulmonary;  Laterality: Right;   VIDEO BRONCHOSCOPY WITH ENDOBRONCHIAL ULTRASOUND Right 08/13/2019   Procedure: VIDEO BRONCHOSCOPY WITH ENDOBRONCHIAL ULTRASOUND;  Surgeon: Josephine Igo, DO;  Location: [facility];  Service: Pulmonary;  Laterality: Right;     FAMILY HISTORY: family history includes Cancer in her paternal aunt; Heart disease (age of onset: 37) in her mother; Hypertension in her mother; Pneumonia in her brother.   SOCIAL HISTORY:  reports that she quit smoking about 7 years ago. Her smoking use included cigarettes. She has a 15.00 pack-year smoking history. She has never used smokeless tobacco. She reports that she does not currently use alcohol. She reports that she does not currently use drugs.   ALLERGIES: Crab [shellfish allergy] and Morphine and related  MEDICATIONS:  Current Facility-Administered Medications  Medication Dose Route Frequency Provider Last Rate Last Admin   acetaminophen (TYLENOL) tablet 650 mg  650 mg Oral Q6H PRN Carollee Herter, DO   650 mg at 03/09/22 2130   Or   acetaminophen (TYLENOL) suppository 650 mg  650 mg Rectal Q6H PRN Carollee Herter,  DO       amLODipine (NORVASC) tablet 5 mg  5 mg Oral QPM Carollee Herter, DO   5 mg at 03/08/22 2154   [START ON 03/11/2022] aspirin EC tablet 81 mg  81 mg Oral QHS Allred, Darrell K, PA-C       dexamethasone (DECADRON) tablet 4 mg  4 mg Oral Q8H Gorsuch, Ni, MD   4 mg at 03/09/22 0601   gabapentin (NEURONTIN) capsule 600 mg  600 mg Oral TID Carollee Herter, DO   600 mg at 03/09/22 0949   heparin injection 5,000 Units  5,000 Units Subcutaneous Q8H Allred, Darrell K, PA-C   5,000 Units at 03/09/22 0601   HYDROmorphone (DILAUDID) injection 0.5-1 mg  0.5-1 mg Intravenous Q2H PRN Carollee Herter, DO   1 mg at 03/09/22 8657   lidocaine (LIDODERM) 5 % 2 patch  2 patch Transdermal Q24H Carollee Herter, DO   2 patch at 03/08/22 2153   lisinopril (ZESTRIL) tablet 20 mg  20 mg Oral q AM Carollee Herter, DO   20 mg at 03/09/22 0950   LORazepam (ATIVAN) injection 1 mg  1 mg Intravenous PRN Carollee Herter, DO       ondansetron [facility]) tablet 4 mg  4 mg Oral Q6H PRN Carollee Herter, DO       Or   ondansetron [facility]) injection 4 mg  4 mg Intravenous Q6H PRN Carollee Herter, DO       oxyCODONE (Oxy IR/ROXICODONE) immediate release tablet 5 mg  5 mg Oral Q4H PRN Carollee Herter, DO   5 mg at 03/09/22 0950   polyethylene glycol (MIRALAX / GLYCOLAX) packet 17 g  17 g Oral Daily PRN Kc, Dayna Barker, MD       senna-docusate (Senokot-S) tablet 1 tablet  1 tablet Oral BID Kc, Ramesh, MD   1 tablet at 03/08/22 1029   tiZANidine (ZANAFLEX) tablet 2 mg  2 mg Oral QHS Lanae Boast, MD   2 mg at 03/08/22 2154     REVIEW OF SYSTEMS:  A 15 point review of systems is documented in the electronic medical record. This was obtained by the nursing staff. However, I reviewed this with the patient to discuss relevant findings and make appropriate changes.  Pertinent items are noted in HPI.    PHYSICAL EXAM:  height is 5\' 6"  (1.676 m) and weight is 192 lb 0.3 oz (87.1 kg). Her oral temperature is 97.9 F (36.6 C). Her blood pressure is 118/71 and her pulse is 61. Her  respiration is 18 and oxygen saturation is 99%.   ECOG = 1  0 - Asymptomatic (Fully active, able to carry on all predisease activities without restriction)  1 - Symptomatic but completely ambulatory (Restricted in physically strenuous activity but ambulatory and able to carry out work of a light or sedentary nature. For example, light housework, office work)  2 - Symptomatic, <50% in bed during the day (Ambulatory and capable of all self care but unable to carry out any work activities. Up and about more than 50% of waking hours)  3 - Symptomatic, >50% in bed, but not bedbound (Capable of only limited self-care, confined to bed  or chair 50% or more of waking hours)  4 - Bedbound (Completely disabled. Cannot carry on any self-care. Totally confined to bed or chair)  5 - Death   Santiago Glad MM, Creech RH, Tormey DC, et al. [phone]). "Toxicity and response criteria of the [facility] Group". Am. Evlyn Clines. Oncol. 5 (6): 649-55  Alert, no acute distress 5 out of 5 strength in the lower extremities bilaterally   LABORATORY DATA:  Lab Results  Component Value Date   WBC 19.4 (H) 03/09/2022   HGB 11.5 (L) 03/09/2022   HCT 36.0 03/09/2022   MCV 91.6 03/09/2022   PLT 333 03/09/2022   Lab Results  Component Value Date   NA 134 (L) 03/09/2022   K 4.6 03/09/2022   CL 102 03/09/2022   CO2 23 03/09/2022   Lab Results  Component Value Date   ALT 12 03/09/2022   AST 15 03/09/2022   ALKPHOS 126 03/09/2022   BILITOT 0.5 03/09/2022      RADIOGRAPHY: MR BRAIN W WO CONTRAST  Result Date: 03/08/2022 CLINICAL DATA:  Metastatic disease evaluation. EXAM: MRI HEAD WITHOUT AND WITH CONTRAST TECHNIQUE: Multiplanar, multiecho pulse sequences of the brain and surrounding structures were obtained without and with intravenous contrast. CONTRAST:  9mL GADAVIST GADOBUTROL 1 MMOL/ML IV SOLN COMPARISON:  Head CT 02/16/2005 FINDINGS: Brain: There is no evidence of an acute infarct, intracranial  hemorrhage, mass, midline shift, or extra-axial fluid collection. There is mild cerebral atrophy. Small T2 hyperintensities in the cerebral white matter bilaterally are nonspecific but compatible with minimal chronic small vessel ischemic disease. No abnormal enhancement is identified. Vascular: Major intracranial vascular flow voids are preserved. Skull and upper cervical spine: 1.3 cm sclerotic focus in the right frontal calvarium, not felt to reflect active metastatic disease as this was also present in 2006 and is without appreciable enhancement. Disc degeneration and asymmetric left-sided facet arthrosis at C3-4 and C4-5. Sinuses/Orbits: Unremarkable orbits. Paranasal sinuses and mastoid air cells are clear. Other: None. IMPRESSION: No evidence of intracranial metastases. Electronically Signed   By: Sebastian Ache M.D.   On: 03/08/2022 14:30   MR THORACIC SPINE W WO CONTRAST  Result Date: 03/07/2022 CLINICAL DATA:  Metastatic disease evaluation EXAM: MRI THORACIC AND LUMBAR SPINE WITHOUT AND WITH CONTRAST TECHNIQUE: Multiplanar and multiecho pulse sequences of the thoracic and lumbar spine were obtained without and with intravenous contrast. CONTRAST:  8mL GADAVIST GADOBUTROL 1 MMOL/ML IV SOLN COMPARISON:  No prior MRI, correlation is made with same day CT chest abdomen pelvis. FINDINGS: MRI THORACIC SPINE FINDINGS Alignment: S shaped curvature of the thoracolumbar spine. 3 mm anterolisthesis of C7 on T1. No significant listhesis in the thoracic spine. Vertebrae: Abnormal marrow signal in the T1 vertebral body, extending into the pedicles; in the spinous process and left pedicle of T8 (series 17, images 8 and 11); in the posterior aspect of T9 and in the posterior elements, described in more detail below; at the superior aspect of T10 and in the spinous process (series 17, images 10 and 11). The majority of the T9 vertebral body is occupied by an expansile lesion, which extends into the right greater than  left posterior elements and proximal right ninth and tenth ribs (series 30, image 32), as well as likely the right eighth rib. This circumferentially narrows the spinal canal, causing severe spinal canal stenosis posterior to the T9 vertebral body, as well as moderate right neural foraminal narrowing at T8-T9 and severe right neural foraminal narrowing at T9-T10. While  the majority of the aforementioned lesions do not demonstrate significant enhancement, the T9 lesion and its components in the right ninth and tenth ribs do enhance. Cord: Deformation of the spinal cord posterior to the T9 vertebral body. No abnormal spinal cord signal. Otherwise normal morphology. Paraspinal and other soft tissues: No acute finding. Hepatic cysts, which are better evaluate CT. Disc levels: C6-C7: Moderate spinal canal stenosis. C7-T1: Mild-to-moderate spinal canal stenosis. T8-T9: No spinal canal stenosis. Moderate right neural foraminal narrowing. T9: Severe spinal canal stenosis. T9-T10: Mild to moderate spinal canal stenosis. Severe right neural foraminal narrowing. MRI LUMBAR SPINE FINDINGS Segmentation:  5 lumbar type vertebral bodies. Alignment: S shaped curvature of the thoracolumbar spine. Trace anterolisthesis of L3 on L4. Vertebrae: Abnormal marrow signal in the left posterior aspect of L3, involving the anterior aspect of the pedicle (series 23, image 11). Abnormal signal involving the right greater than left posterior elements of L4 (series 23, images 5-12). Abnormal signal involving the majority of the L5 vertebral body. The pedicles do not appear to be significantly involved, although there is some abnormal signal in the right inferior to killer facet (series 23, image 7). Conus medullaris: Extends to the L2 level and appears normal. No definite abnormal enhancement. Several nerve roots at the level of L5-S1 demonstrate mildly increased T1 signal (series 27, image 31). Paraspinal and other soft tissues: Indeterminate  right renal mass, which is better evaluated on the same day CT abdomen pelvis. Disc levels: T12-L1: No significant disc bulge. No spinal canal stenosis or neural foraminal narrowing. L1-L2: No significant disc bulge. Mild facet arthropathy. No spinal canal stenosis. Mild right neural foraminal narrowing. L2-L3: Mild disc bulge. Mild facet arthropathy. Ligamentum flavum hypertrophy. Borderline spinal canal stenosis. Mild right neural foraminal narrowing. L3-L4: Trace anterolisthesis with mild disc bulge and disc unroofing. Mild-to-moderate facet arthropathy. Ligamentum flavum hypertrophy. Narrowing of the lateral recesses. No spinal canal stenosis or neural foraminal narrowing. L4-L5: Mild disc bulge. Mild facet arthropathy. Ligamentum flavum hypertrophy. Narrowing of the lateral recesses. No spinal canal stenosis. No neural foraminal narrowing. Disc height loss with right eccentric disc osteophyte complex. No spinal canal stenosis. No definite neural foraminal narrowing. L5-S1: No significant disc bulge. No spinal canal stenosis or neural foraminal narrowing. IMPRESSION: 1. 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 This causes severe spinal canal stenosis, with deformation of the spinal cord but no abnormal cord signal, as well as mild to moderate spinal canal stenosis at T9-T10, moderate neural foraminal narrowing T8-T9, and severe right neural foraminal narrowing at T9-T10. 2. Additional metastatic disease is seen at T1, T8, T10, L3, L4, and L5, as described above. 3. C6-C7 moderate spinal canal stenosis. 4. C7-T1 mild-to-moderate spinal canal stenosis. 5. L3-L4 and L4-L5 narrowing of the lateral recesses, which could affect the descending L4 and L5 nerve roots, respectively. 6. L1-L2 and  L2-L3 mild right neural foraminal narrowing. 7. Indeterminate right renal mass, which is better evaluated on the same day CT abdomen pelvis. Electronically Signed   By: Wiliam Ke M.D.   On: 03/07/2022 00:03   MR Lumbar Spine W Wo Contrast  Result Date: 03/07/2022 CLINICAL DATA:  Metastatic disease evaluation EXAM: MRI THORACIC AND LUMBAR SPINE WITHOUT AND WITH CONTRAST TECHNIQUE: Multiplanar and multiecho pulse sequences of the thoracic and lumbar spine were obtained  without and with intravenous contrast. CONTRAST:  8mL GADAVIST GADOBUTROL 1 MMOL/ML IV SOLN COMPARISON:  No prior MRI, correlation is made with same day CT chest abdomen pelvis. FINDINGS: MRI THORACIC SPINE FINDINGS Alignment: S shaped curvature of the thoracolumbar spine. 3 mm anterolisthesis of C7 on T1. No significant listhesis in the thoracic spine. Vertebrae: Abnormal marrow signal in the T1 vertebral body, extending into the pedicles; in the spinous process and left pedicle of T8 (series 17, images 8 and 11); in the posterior aspect of T9 and in the posterior elements, described in more detail below; at the superior aspect of T10 and in the spinous process (series 17, images 10 and 11). The majority of the T9 vertebral body is occupied by an expansile lesion, which extends into the right greater than left posterior elements and proximal right ninth and tenth ribs (series 30, image 32), as well as likely the right eighth rib. This circumferentially narrows the spinal canal, causing severe spinal canal stenosis posterior to the T9 vertebral body, as well as moderate right neural foraminal narrowing at T8-T9 and severe right neural foraminal narrowing at T9-T10. While the majority of the aforementioned lesions do not demonstrate significant enhancement, the T9 lesion and its components in the right ninth and tenth ribs do enhance. Cord: Deformation of the spinal cord posterior to the T9 vertebral body. No abnormal spinal cord signal.  Otherwise normal morphology. Paraspinal and other soft tissues: No acute finding. Hepatic cysts, which are better evaluate CT. Disc levels: C6-C7: Moderate spinal canal stenosis. C7-T1: Mild-to-moderate spinal canal stenosis. T8-T9: No spinal canal stenosis. Moderate right neural foraminal narrowing. T9: Severe spinal canal stenosis. T9-T10: Mild to moderate spinal canal stenosis. Severe right neural foraminal narrowing. MRI LUMBAR SPINE FINDINGS Segmentation:  5 lumbar type vertebral bodies. Alignment: S shaped curvature of the thoracolumbar spine. Trace anterolisthesis of L3 on L4. Vertebrae: Abnormal marrow signal in the left posterior aspect of L3, involving the anterior aspect of the pedicle (series 23, image 11). Abnormal signal involving the right greater than left posterior elements of L4 (series 23, images 5-12). Abnormal signal involving the majority of the L5 vertebral body. The pedicles do not appear to be significantly involved, although there is some abnormal signal in the right inferior to killer facet (series 23, image 7). Conus medullaris: Extends to the L2 level and appears normal. No definite abnormal enhancement. Several nerve roots at the level of L5-S1 demonstrate mildly increased T1 signal (series 27, image 31). Paraspinal and other soft tissues: Indeterminate right renal mass, which is better evaluated on the same day CT abdomen pelvis. Disc levels: T12-L1: No significant disc bulge. No spinal canal stenosis or neural foraminal narrowing. L1-L2: No significant disc bulge. Mild facet arthropathy. No spinal canal stenosis. Mild right neural foraminal narrowing. L2-L3: Mild disc bulge. Mild facet arthropathy. Ligamentum flavum hypertrophy. Borderline spinal canal stenosis. Mild right neural foraminal narrowing. L3-L4: Trace anterolisthesis with mild disc bulge and disc unroofing. Mild-to-moderate facet arthropathy. Ligamentum flavum hypertrophy. Narrowing of the lateral recesses. No spinal canal  stenosis or neural foraminal narrowing. L4-L5: Mild disc bulge. Mild facet arthropathy. Ligamentum flavum hypertrophy. Narrowing of the lateral recesses. No spinal canal stenosis. No neural foraminal narrowing. Disc height loss with right eccentric disc osteophyte complex. No spinal canal stenosis. No definite neural foraminal narrowing. L5-S1: No significant disc bulge. No spinal canal stenosis or neural foraminal narrowing. IMPRESSION: 1. Multifocal osseous metastatic disease in the thoracic and lumbar spine, with the most significant, expansile lesion occupying  the majority of the T9 vertebral body and extending into the right greater than left posterior elements and proximal right ninth and tenth ribs. This causes severe spinal canal stenosis, with deformation of the spinal cord but no abnormal cord signal, as well as mild to moderate spinal canal stenosis at T9-T10, moderate neural foraminal narrowing T8-T9, and severe right neural foraminal narrowing at T9-T10. 2. Additional metastatic disease is seen at T1, T8, T10, L3, L4, and L5, as described above. 3. C6-C7 moderate spinal canal stenosis. 4. C7-T1 mild-to-moderate spinal canal stenosis. 5. L3-L4 and L4-L5 narrowing of the lateral recesses, which could affect the descending L4 and L5 nerve roots, respectively. 6. L1-L2 and L2-L3 mild right neural foraminal narrowing. 7. Indeterminate right renal mass, which is better evaluated on the same day CT abdomen pelvis. Electronically Signed   By: Wiliam Ke M.D.   On: 03/07/2022 00:03   CT Angio Chest Pulmonary Embolism (PE) W or WO Contrast  Result Date: 03/06/2022 CLINICAL DATA:  Shortness of breath with chest pain. Acute abdominal pain. History of non-small cell lung cancer status post right lobectomy. EXAM: CT ANGIOGRAPHY CHEST CT ABDOMEN AND PELVIS WITH CONTRAST TECHNIQUE: Multidetector CT imaging of the chest was performed using the standard protocol during bolus administration of intravenous  contrast. Multiplanar CT image reconstructions and MIPs were obtained to evaluate the vascular anatomy. Multidetector CT imaging of the abdomen and pelvis was performed using the standard protocol during bolus administration of intravenous contrast. RADIATION DOSE REDUCTION: This exam was performed according to the departmental dose-optimization program which includes automated exposure control, adjustment of the mA and/or kV according to patient size and/or use of iterative reconstruction technique. CONTRAST:  OMNIPAQUE IOHEXOL 350 MG/ML SOLN COMPARISON:  MRI abdomen 12/02/2021. CT chest 08/13/2021. chest CT 02/24/2020. FINDINGS: CTA CHEST FINDINGS Cardiovascular: Satisfactory opacification of the pulmonary arteries to the segmental level. No evidence of pulmonary embolism. Normal heart size. No pericardial effusion. Mediastinum/Nodes: No enlarged mediastinal, hilar, or axillary lymph nodes. Thyroid gland, trachea, and esophagus demonstrate no significant findings. Lungs/Pleura: Right upper lobectomy changes are again seen. There is stable scarring and bronchiectasis in the right upper lobe. There is some scattered peripheral reticular opacities which are nonspecific throughout both lower lungs. There is no focal lung infiltrate, pleural effusion or pneumothorax. There is a 3 mm nodule in the left upper lobe image 5/1 which is unchanged from most recent prior. This is new compared to 2021. No new pulmonary nodules are seen. No pleural effusion or pneumothorax. Musculoskeletal: There is new metastatic disease in the posterior elements T9, right greater than left, extending into the posterior right ninth rib. There is soft tissue extension into the central spinal canal causing central spinal canal stenosis. There are healed posterior right rib fractures. Metastatic osseous lesion in the T1 vertebral body has increased in size now measuring 14 mm (previously 8 mm). Review of the MIP images confirms the above  findings. CT ABDOMEN and PELVIS FINDINGS Hepatobiliary: Scattered rounded hypodense lesions in the liver are unchanged and favored as small cysts. No new liver lesions. Gallbladder and bile ducts are within normal limits. Pancreas: Unremarkable. No pancreatic ductal dilatation or surrounding inflammatory changes. Spleen: Normal in size without focal abnormality. Adrenals/Urinary Tract: Rounded right renal mass measuring 2.2 cm is unchanged. Otherwise, adrenal glands, kidneys and bladder are within normal limits. Stomach/Bowel: Stomach is within normal limits. Appendix appears normal. No evidence of bowel wall thickening, distention, or inflammatory changes. There is a large amount of stool  throughout the colon. Vascular/Lymphatic: Aortic atherosclerosis. No enlarged abdominal or pelvic lymph nodes. Reproductive: Status post hysterectomy. No adnexal masses. Other: No ascites or free air. Musculoskeletal: Sclerotic osseous metastatic disease has increased in the spine, most significantly at the L5 level measuring up to 4.5 cm (previously 3.1 cm). No acute fractures are seen. Review of the MIP images confirms the above findings. IMPRESSION: 1. No evidence for pulmonary embolism. 2. Stable right upper lobectomy changes. 3. Stable 3 mm left upper lobe pulmonary nodule. 4. New metastatic disease in the posterior elements of T9 with soft tissue extension into the central spinal canal causing central spinal canal stenosis. Correlate for signs of cord compression. This can be further evaluated with MRI. 5. Worsening osseous metastatic disease. 6. No acute localizing process in the abdomen or pelvis. 7. Stable right renal mass worrisome for renal cell carcinoma. Aortic Atherosclerosis (ICD10-I70.0). Electronically Signed   By: Darliss Cheney M.D.   On: 03/06/2022 20:14   CT ABDOMEN PELVIS W CONTRAST  Result Date: 03/06/2022 CLINICAL DATA:  Shortness of breath with chest pain. Acute abdominal pain. History of non-small  cell lung cancer status post right lobectomy. EXAM: CT ANGIOGRAPHY CHEST CT ABDOMEN AND PELVIS WITH CONTRAST TECHNIQUE: Multidetector CT imaging of the chest was performed using the standard protocol during bolus administration of intravenous contrast. Multiplanar CT image reconstructions and MIPs were obtained to evaluate the vascular anatomy. Multidetector CT imaging of the abdomen and pelvis was performed using the standard protocol during bolus administration of intravenous contrast. RADIATION DOSE REDUCTION: This exam was performed according to the departmental dose-optimization program which includes automated exposure control, adjustment of the mA and/or kV according to patient size and/or use of iterative reconstruction technique. CONTRAST:  OMNIPAQUE IOHEXOL 350 MG/ML SOLN COMPARISON:  MRI abdomen 12/02/2021. CT chest 08/13/2021. chest CT 02/24/2020. FINDINGS: CTA CHEST FINDINGS Cardiovascular: Satisfactory opacification of the pulmonary arteries to the segmental level. No evidence of pulmonary embolism. Normal heart size. No pericardial effusion. Mediastinum/Nodes: No enlarged mediastinal, hilar, or axillary lymph nodes. Thyroid gland, trachea, and esophagus demonstrate no significant findings. Lungs/Pleura: Right upper lobectomy changes are again seen. There is stable scarring and bronchiectasis in the right upper lobe. There is some scattered peripheral reticular opacities which are nonspecific throughout both lower lungs. There is no focal lung infiltrate, pleural effusion or pneumothorax. There is a 3 mm nodule in the left upper lobe image 5/1 which is unchanged from most recent prior. This is new compared to 2021. No new pulmonary nodules are seen. No pleural effusion or pneumothorax. Musculoskeletal: There is new metastatic disease in the posterior elements T9, right greater than left, extending into the posterior right ninth rib. There is soft tissue extension into the central spinal canal  causing central spinal canal stenosis. There are healed posterior right rib fractures. Metastatic osseous lesion in the T1 vertebral body has increased in size now measuring 14 mm (previously 8 mm). Review of the MIP images confirms the above findings. CT ABDOMEN and PELVIS FINDINGS Hepatobiliary: Scattered rounded hypodense lesions in the liver are unchanged and favored as small cysts. No new liver lesions. Gallbladder and bile ducts are within normal limits. Pancreas: Unremarkable. No pancreatic ductal dilatation or surrounding inflammatory changes. Spleen: Normal in size without focal abnormality. Adrenals/Urinary Tract: Rounded right renal mass measuring 2.2 cm is unchanged. Otherwise, adrenal glands, kidneys and bladder are within normal limits. Stomach/Bowel: Stomach is within normal limits. Appendix appears normal. No evidence of bowel wall thickening, distention, or inflammatory changes.  There is a large amount of stool throughout the colon. Vascular/Lymphatic: Aortic atherosclerosis. No enlarged abdominal or pelvic lymph nodes. Reproductive: Status post hysterectomy. No adnexal masses. Other: No ascites or free air. Musculoskeletal: Sclerotic osseous metastatic disease has increased in the spine, most significantly at the L5 level measuring up to 4.5 cm (previously 3.1 cm). No acute fractures are seen. Review of the MIP images confirms the above findings. IMPRESSION: 1. No evidence for pulmonary embolism. 2. Stable right upper lobectomy changes. 3. Stable 3 mm left upper lobe pulmonary nodule. 4. New metastatic disease in the posterior elements of T9 with soft tissue extension into the central spinal canal causing central spinal canal stenosis. Correlate for signs of cord compression. This can be further evaluated with MRI. 5. Worsening osseous metastatic disease. 6. No acute localizing process in the abdomen or pelvis. 7. Stable right renal mass worrisome for renal cell carcinoma. Aortic Atherosclerosis  (ICD10-I70.0). Electronically Signed   By: Darliss Cheney M.D.   On: 03/06/2022 20:14   DG Chest 2 View  Result Date: 03/06/2022 CLINICAL DATA:  Chest pain. EXAM: CHEST - 2 VIEW COMPARISON:  06/15/2020 FINDINGS: The cardiac silhouette, mediastinal and hilar contours are within normal limits and stable. Stable tortuosity of the thoracic aorta. Chronic emphysematous changes and remote surgical changes involving the right lung. Probable radiation changes also. No acute pulmonary findings or worrisome pulmonary lesions. IMPRESSION: Chronic lung changes but no acute pulmonary findings. Electronically Signed   By: Rudie Meyer M.D.   On: 03/06/2022 17:20       IMPRESSION/ PLAN:  The patient has recent imaging indicative of stage IV disease with multiple bony metastases.  In particular, the T9 level of the spine has tumor extension into the canal with abutment of the cord itself.  No neuro symptoms as of yet.  I discussed the importance of this finding with the patient and she understands the importance of alerting the staff to any changes in such symptoms as weakness, numbness, and loss of control of bladder/bowel.  The patient's imaging also demonstrates a renal tumor which is concerning for a renal cell cancer.  She is therefore scheduled for a biopsy on Monday as the histology at this point is not clear, most like a non-small cell lung cancer versus renal cell cancer.  I discussed with the patient the potential role of palliative radiation treatment.  I also discussed with her that surgical resection is the only way to immediately decompress this area.  If the patient has a change in neuro symptoms, then urgent surgery may be necessary.  Otherwise, would continue steroids and plan for biopsy on Monday.  Given the extent of tumor on MRI scan, would also recommend every 6 to 8 hours neurochecks.  We will continue to follow the patient and await further results.      The patient was seen in person today in  the hospital.  The total time spent on the patient's visit today was 45 minutes, including chart review, direct discussion/evaluation with the patient, and coordination of care.  ________________________________   Radene Gunning, MD, PhD   **Disclaimer: This note was dictated with voice recognition software. Similar sounding words can inadvertently be transcribed and this note may contain transcription errors which may not have been corrected upon publication of note.**

## 2022-03-09 NOTE — Progress Notes (Signed)
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 in [place] in 2021, known renal mass since 2021 has been slowly growing, most recent MRI in August 2023 showed growing in size 2.4 X2.3X 2.6 lateral lower right kidney concerning for possible malignancy, also had enhancement of right side of T9 and L5 for bony mets and is scheduled for PET/CT on 03/11/2022, hypertension, presented w/ worsening right-sided rib pain has been ongoing for the last several months,was recently seen by her PCP that stopped her gabapentin and switched her to Lyrica due to continued pain   Seen in the ED, afebrile initial BP high up to 989 systolic, labs with leukocytosis hyponatremia mildly elevated TB.  CTA chest was negative for PE-showed T9 mets with some soft tissue extension into the central spinal canal causing central spinal canal stenosis. Patient ble to walk but does complain bitterly about her right-sided rib pain. [facility] contacted for admission due to uncontrolled pain.   MRI T-L spine done showed "Multifocal osseous metastatic disease in the thoracic and lumbar spine, with the most significant, expansile lesion occupying the majority of the T9 vertebral body and extending into the right greater than left posterior elements and proximal right ninth and tenth ribs. This causes severe spinal canal stenosis, with deformation of the spinal cord but no abnormal cord signal, as well as mild to moderate spinal canal stenosis at T9-T10, moderate neural foraminal narrowing T8-T9, and severe right neural foraminal narrowing at T9-T10.Additional metastatic disease is seen at T1, T8, T10, L3, L4, and L5, as described above. C6-C7 moderate spinal canal stenosis. C7-T1 mild-to-moderate spinal canal stenosis. L3-L4 and L4-L5 narrowing of the lateral recesses, which  could affect the descending L4 and L5 nerve roots, respectively. L1-L2 and L2-L3 mild right neural foraminal narrowing. Indeterminate right renal mass, which is better evaluated on the same day CT abdomen pelvis"    Subjective: 11/25 A/O x4 sitting in bed comfortably.     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 See report of MRI for detail, on Neurontin, lidocaine patch along with p.o. oxy IV Dilaudid for pain control.  -P.o. oxy is causing patient nausea which is affecting her ability to eat.  Discontinue - Continue IV Dilaudid for pain control will titrate up as required -Consulted Dr. Hilbert Odor, unclear if the cancer in the bone is from her previous lung cancer versus metastatic cancer from the kidney. -R/Rad onc consult per [facility]-contacted by [facility]-starting on dexamethasone, continue aggressive pain management, bowel regimen.   -Per Dr. Narda Rutherford oncology biopsy has been ordered but not yet performed.   Right renal mass -Worrisome for renal carcinoma-per onco   Adenocarcinoma the right lung stage II -Followed by Dr. Julien Nordmann on right lobectomy in 2021.   Uncontrolled hypertension:  -Amlodipine 5 mg daily - Lisinopril 20 mg daily    Leukocytosis:  -Chest x-ray CT scan no pneumonia,afebrile, recheck labs, check UA.  Obesity (BMI30.99 kg/m.) -Address with  PCP           Mobility Assessment (last 72 hours)     Mobility Assessment     Row Name 03/09/22 0745 03/08/22 1029 03/07/22 2149 03/07/22 0930 03/06/22 2340   Does patient have an order for bedrest or is patient medically  unstable No - Continue assessment No - Continue assessment No - Continue assessment No - Continue assessment No - Continue assessment   What is the highest level of mobility based on the progressive mobility assessment? Level 6 (Walks independently in room and hall) - Balance while walking in room without assist - Complete Level 6 (Walks independently in room and hall) - Balance while walking in room without assist - Complete Level 5 (Walks with assist in room/hall) - Balance while stepping forward/back and can walk in room with assist - Complete Level 6 (Walks independently in room and hall) - Balance while walking in room without assist - Complete Level 4 (Walks with assist in room) - Balance while marching in place and cannot step forward and back - Complete                    DVT prophylaxis: Heparin subcu Code Status:  Family Communication: Full Status is: Inpatient    Dispo: The patient is from: Home              Anticipated d/c is to: Home              Anticipated d/c date is: > 3 days              Patient currently is not medically stable to d/c.      Consultants:    Procedures/Significant Events:    I have personally reviewed and interpreted all radiology studies and my findings are as above.  VENTILATOR SETTINGS:    Cultures   Antimicrobials:    Devices    LINES / TUBES:      Continuous Infusions:   Objective: Vitals:   03/08/22 1412 03/08/22 2053 03/09/22 0438 03/09/22 1342  BP: (!) 100/55 136/74 118/71 126/73  Pulse: 67 72 61 67  Resp: 18 18 18    Temp: (!) 97.5 F (36.4 C) 98.9 F (37.2 C) 97.9 F (36.6 C) 98.7 F (37.1 C)  TempSrc: Oral Oral Oral Oral  SpO2: 99% 99% 99% 99%  Weight:      Height:        Intake/Output Summary (Last 24 hours) at 03/09/2022 2021 Last data filed at 03/09/2022 1745 Gross per 24 hour  Intake 720 ml  Output 1200 ml  Net -480 ml    Filed Weights   03/07/22 0035 03/07/22 0500  Weight: 87 kg  87.1 kg    Examination:  General: A/O x4, No acute respiratory distress Eyes: negative scleral hemorrhage, negative anisocoria, negative icterus ENT: Negative Runny nose, negative gingival bleeding, Neck:  Negative scars, masses, torticollis, lymphadenopathy, JVD Lungs: Clear to auscultation bilaterally without wheezes or crackles Cardiovascular: Regular rate and rhythm without murmur gallop or rub normal S1 and S2 Abdomen: negative abdominal pain, nondistended, positive soft, bowel sounds, no rebound, no ascites, no appreciable mass Extremities: No significant cyanosis, clubbing, or edema bilateral lower extremities Skin: Negative rashes, lesions, ulcers Psychiatric:  Negative depression, negative anxiety, negative fatigue, negative mania  Central nervous system:  Cranial nerves II through XII intact, tongue/uvula midline, all extremities muscle strength 5/5, sensation intact throughout, negative dysarthria, negative expressive aphasia, negative receptive aphasia.  Marland Kitchen  Data Reviewed: Care during the described time interval was provided by me .  I have reviewed this patient's available data, including medical history, events of note, physical examination, and all test results as part of my evaluation.  CBC: Recent Labs  Lab 03/06/22 1638 03/08/22 0531 03/09/22 0647  WBC 20.5* 17.6* 19.4*  NEUTROABS 18.4*  --  16.2*  HGB 14.1 12.2 11.5*  HCT 46.2* 38.1 36.0  MCV 96.3 91.1 91.6  PLT 362 325 408    Basic Metabolic Panel: Recent Labs  Lab 03/06/22 1638 03/08/22 0531 03/09/22 0647  NA 132* 135 134*  K 4.7 4.5 4.6  CL 98 103 102  CO2 21* 23 23  GLUCOSE 188* 131* 119*  BUN 11 18 38*  CREATININE 0.93 0.87 1.04*  CALCIUM 9.5 9.3 8.9  MG  --   --  2.5*  PHOS  --   --  4.2    GFR: Estimated Creatinine Clearance: 53.5 mL/min (A) (by C-G formula based on SCr of 1.04 mg/dL (H)). Liver Function Tests: Recent Labs  Lab 03/06/22 1638 03/09/22 0647  AST 38 15  ALT 18 12   ALKPHOS 102 126  BILITOT 1.6* 0.5  PROT 8.7* 7.4  ALBUMIN 4.6 3.6    No results for input(s): "LIPASE", "AMYLASE" in the last 168 hours. No results for input(s): "AMMONIA" in the last 168 hours. Coagulation Profile: Recent Labs  Lab 03/09/22 0647  INR 1.0   Cardiac Enzymes: No results for input(s): "CKTOTAL", "CKMB", "CKMBINDEX", "TROPONINI" in the last 168 hours. BNP (last 3 results) No results for input(s): "PROBNP" in the last 8760 hours. HbA1C: No results for input(s): "HGBA1C" in the last 72 hours. CBG: No results for input(s): "GLUCAP" in the last 168 hours. Lipid Profile: No results for input(s): "CHOL", "HDL", "LDLCALC", "TRIG", "CHOLHDL", "LDLDIRECT" in the last 72 hours. Thyroid Function Tests: No results for input(s): "TSH", "T4TOTAL", "FREET4", "T3FREE", "THYROIDAB" in the last 72 hours. Anemia Panel: No results for input(s): "VITAMINB12", "FOLATE", "FERRITIN", "TIBC", "IRON", "RETICCTPCT" in the last 72 hours. Sepsis Labs: No results for input(s): "PROCALCITON", "LATICACIDVEN" in the last 168 hours.  No results found for this or any previous visit (from the past 240 hour(s)).       Radiology Studies: MR BRAIN W WO CONTRAST  Result Date: 03/08/2022 CLINICAL DATA:  Metastatic disease evaluation. EXAM: MRI HEAD WITHOUT AND WITH CONTRAST TECHNIQUE: Multiplanar, multiecho pulse sequences of the brain and surrounding structures were obtained without and with intravenous contrast. CONTRAST:  29mL GADAVIST GADOBUTROL 1 MMOL/ML IV SOLN COMPARISON:  Head CT 02/16/2005 FINDINGS: Brain: There is no evidence of an acute infarct, intracranial hemorrhage, mass, midline shift, or extra-axial fluid collection. There is mild cerebral atrophy. Small T2 hyperintensities in the cerebral white matter bilaterally are nonspecific but compatible with minimal chronic small vessel ischemic disease. No abnormal enhancement is identified. Vascular: Major intracranial vascular flow voids are  preserved. Skull and upper cervical spine: 1.3 cm sclerotic focus in the right frontal calvarium, not felt to reflect active metastatic disease as this was also present in 2006 and is without appreciable enhancement. Disc degeneration and asymmetric left-sided facet arthrosis at C3-4 and C4-5. Sinuses/Orbits: Unremarkable orbits. Paranasal sinuses and mastoid air cells are clear. Other: None. IMPRESSION: No evidence of intracranial metastases. Electronically Signed   By: Logan Bores M.D.   On: 03/08/2022 14:30        Scheduled Meds:  amLODipine  5 mg Oral QPM   [START ON 03/11/2022] aspirin EC  81 mg Oral QHS   dexamethasone  4 mg Oral Q8H   gabapentin  600 mg Oral TID   heparin  5,000 Units Subcutaneous Q8H   lidocaine  2 patch Transdermal Q24H   lisinopril  20 mg Oral q AM   senna-docusate  1 tablet Oral BID   tiZANidine  2 mg Oral QHS   Continuous Infusions:   LOS: 0 days    Time spent:40 min    Yvaine Jankowiak, Geraldo Docker, MD [facility]   If 7PM-7AM, please contact night-coverage 03/09/2022, 8:21 PM

## 2022-03-09 NOTE — Progress Notes (Signed)
Mobility Specialist - Progress Note   03/09/22 1213  Mobility  Activity Ambulated with assistance in hallway  Level of Assistance Independent after set-up  Assistive Device None  Distance Ambulated (ft) 350 ft  Activity Response Tolerated well  Mobility Referral Yes  $Mobility charge 1 Mobility   Pt received in chair and requested mobility, pt is is supervision when transitioning from sit to stand due to pain. Pt doesn't request assistance during session because she wants to work closer to her baseline of IND. Pt returned to chair with all needs met.   Roderick Pee Mobility Specialist

## 2022-03-09 NOTE — Progress Notes (Signed)
Received message of possible consult in epic  In my review of chart and images, it appears the patient is scheduled for biopsy on Monday with IR.  May need urgent surgical decompression if neuro decline or radioresistant tumor on biopsy due to severe stenosis  Please page nsx if change in neuro exam  Agree with steroids   Elwin Sleight, DO Neurosurgeon

## 2022-03-10 DIAGNOSIS — C3491 Malignant neoplasm of unspecified part of right bronchus or lung: Secondary | ICD-10-CM | POA: Diagnosis not present

## 2022-03-10 DIAGNOSIS — C7951 Secondary malignant neoplasm of bone: Secondary | ICD-10-CM | POA: Diagnosis not present

## 2022-03-10 DIAGNOSIS — R0781 Pleurodynia: Secondary | ICD-10-CM | POA: Diagnosis not present

## 2022-03-10 DIAGNOSIS — C799 Secondary malignant neoplasm of unspecified site: Secondary | ICD-10-CM | POA: Diagnosis not present

## 2022-03-10 LAB — CBC WITH DIFFERENTIAL/PLATELET
Abs Immature Granulocytes: 0.09 10*3/uL — ABNORMAL HIGH (ref 0.00–0.07)
Basophils Absolute: 0 10*3/uL (ref 0.0–0.1)
Basophils Relative: 0 %
Eosinophils Absolute: 0 10*3/uL (ref 0.0–0.5)
Eosinophils Relative: 0 %
HCT: 40.9 % (ref 36.0–46.0)
Hemoglobin: 12.8 g/dL (ref 12.0–15.0)
Immature Granulocytes: 0 %
Lymphocytes Relative: 9 %
Lymphs Abs: 1.9 10*3/uL (ref 0.7–4.0)
MCH: 28.9 pg (ref 26.0–34.0)
MCHC: 31.3 g/dL (ref 30.0–36.0)
MCV: 92.3 fL (ref 80.0–100.0)
Monocytes Absolute: 1.2 10*3/uL — ABNORMAL HIGH (ref 0.1–1.0)
Monocytes Relative: 6 %
Neutro Abs: 17.7 10*3/uL — ABNORMAL HIGH (ref 1.7–7.7)
Neutrophils Relative %: 85 %
Platelets: 379 10*3/uL (ref 150–400)
RBC: 4.43 MIL/uL (ref 3.87–5.11)
RDW: 12.9 % (ref 11.5–15.5)
WBC: 20.9 10*3/uL — ABNORMAL HIGH (ref 4.0–10.5)
nRBC: 0 % (ref 0.0–0.2)

## 2022-03-10 LAB — COMPREHENSIVE METABOLIC PANEL
ALT: 13 U/L (ref 0–44)
AST: 17 U/L (ref 15–41)
Albumin: 3.9 g/dL (ref 3.5–5.0)
Alkaline Phosphatase: 145 U/L — ABNORMAL HIGH (ref 38–126)
Anion gap: 10 (ref 5–15)
BUN: 34 mg/dL — ABNORMAL HIGH (ref 8–23)
CO2: 26 mmol/L (ref 22–32)
Calcium: 9.7 mg/dL (ref 8.9–10.3)
Chloride: 103 mmol/L (ref 98–111)
Creatinine, Ser: 0.89 mg/dL (ref 0.44–1.00)
GFR, Estimated: >60 mL/min (ref >60)
Glucose, Bld: 118 mg/dL — ABNORMAL HIGH (ref 70–99)
Potassium: 4.8 mmol/L (ref 3.5–5.1)
Sodium: 139 mmol/L (ref 135–145)
Total Bilirubin: 0.6 mg/dL (ref 0.3–1.2)
Total Protein: 8.2 g/dL — ABNORMAL HIGH (ref 6.5–8.1)

## 2022-03-10 LAB — PROTIME-INR
INR: 0.9 (ref 0.8–1.2)
Prothrombin Time: 12.3 seconds (ref 11.4–15.2)

## 2022-03-10 LAB — MAGNESIUM: Magnesium: 2.8 mg/dL — ABNORMAL HIGH (ref 1.7–2.4)

## 2022-03-10 LAB — PHOSPHORUS: Phosphorus: 3.5 mg/dL (ref 2.5–4.6)

## 2022-03-10 NOTE — Progress Notes (Signed)
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 in [place] in 2021, known renal mass since 2021 has been slowly growing, most recent MRI in August 2023 showed growing in size 2.4 X2.3X 2.6 lateral lower right kidney concerning for possible malignancy, also had enhancement of right side of T9 and L5 for bony mets and is scheduled for PET/CT on 03/11/2022, hypertension, presented w/ worsening right-sided rib pain has been ongoing for the last several months,was recently seen by her PCP that stopped her gabapentin and switched her to Lyrica due to continued pain   Seen in the ED, afebrile initial BP high up to 585 systolic, labs with leukocytosis hyponatremia mildly elevated TB.  CTA chest was negative for PE-showed T9 mets with some soft tissue extension into the central spinal canal causing central spinal canal stenosis. Patient ble to walk but does complain bitterly about her right-sided rib pain. [facility] hospitalist contacted for admission due to uncontrolled pain.   MRI T-L spine done showed "Multifocal osseous metastatic disease in the thoracic and lumbar spine, with the most significant, expansile lesion occupying the majority of the T9 vertebral body and extending into the right greater than left posterior elements and proximal right ninth and tenth ribs. This causes severe spinal canal stenosis, with deformation of the spinal cord but no abnormal cord signal, as well as mild to moderate spinal canal stenosis at T9-T10, moderate neural foraminal narrowing T8-T9, and severe right neural foraminal narrowing at T9-T10.Additional metastatic disease is seen at T1, T8, T10, L3, L4, and L5, as described above. C6-C7 moderate spinal canal stenosis. C7-T1 mild-to-moderate spinal canal stenosis. L3-L4 and L4-L5 narrowing of the lateral recesses, which  could affect the descending L4 and L5 nerve roots, respectively. L1-L2 and L2-L3 mild right neural foraminal narrowing. Indeterminate right renal mass, which is better evaluated on the same day CT abdomen pelvis"    Subjective: 11/26 A/O x4 sitting in bed comfortably.  Has been making Christmas ornaments today..     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 See report of MRI for detail, on Neurontin, lidocaine patch along with p.o. oxy IV Dilaudid for pain control.  -P.o. oxy is causing patient nausea which is affecting her ability to eat.  Discontinue - Continue IV Dilaudid for pain control will titrate up as required -Consulted Dr. Hilbert Odor, unclear if the cancer in the bone is from her previous lung cancer versus metastatic cancer from the kidney. -R/Rad onc consult per [facility]-contacted by [facility]monc-starting on dexamethasone, continue aggressive pain management, bowel regimen.   -Per Dr. Narda Rutherford oncology biopsy has been ordered but not yet performed. -11/26 biopsy is scheduled for 11/27   Right renal mass -Worrisome for renal carcinoma-per onco   Adenocarcinoma the right lung stage II -Followed by Dr. Julien Nordmann on right lobectomy in 2021.   Uncontrolled hypertension:  -Amlodipine 5 mg daily - Lisinopril 20 mg daily    Leukocytosis:  -Chest x-ray CT  scan no pneumonia,afebrile, recheck labs, check UA.  Obesity (BMI30.99 kg/m.) -Address with PCP   UTI? - 11/26 many bacteria in urinalysis on UA from 11/23 never sent for culture.  Obtain urine culture         Mobility Assessment (last 72  hours)     Mobility Assessment     Row Name 03/10/22 0843 03/09/22 0745 03/08/22 1029 03/07/22 2149     Does patient have an order for bedrest or is patient medically unstable No - Continue assessment No - Continue assessment No - Continue assessment No - Continue assessment    What is the highest level of mobility based on the progressive mobility assessment? Level 6 (Walks independently in room and hall) - Balance while walking in room without assist - Complete Level 6 (Walks independently in room and hall) - Balance while walking in room without assist - Complete Level 6 (Walks independently in room and hall) - Balance while walking in room without assist - Complete Level 5 (Walks with assist in room/hall) - Balance while stepping forward/back and can walk in room with assist - Complete                     DVT prophylaxis: Heparin subcu Code Status:  Family Communication: Full Status is: Inpatient    Dispo: The patient is from: Home              Anticipated d/c is to: Home              Anticipated d/c date is: > 3 days              Patient currently is not medically stable to d/c.      Consultants:    Procedures/Significant Events:    I have personally reviewed and interpreted all radiology studies and my findings are as above.  VENTILATOR SETTINGS:    Cultures 11/26  Antimicrobials: Anti-infectives (From admission, onward)    None         Devices    LINES / TUBES:      Continuous Infusions:   Objective: Vitals:   03/09/22 1342 03/09/22 2112 03/10/22 0440 03/10/22 1316  BP: 126/73 (!) 154/70 (!) 109/53 137/60  Pulse: 67 77 (!) 51 62  Resp:  18 18 18   Temp: 98.7 F (37.1 C) 98.7 F (37.1 C) 98.1 F (36.7 C) 98.2 F (36.8 C)  TempSrc: Oral Oral Oral Oral  SpO2: 99% 99% 99% 100%  Weight:      Height:        Intake/Output Summary (Last 24 hours) at 03/10/2022 2103 Last data filed at 03/10/2022 1407 Gross per 24 hour  Intake 600  ml  Output 650 ml  Net -50 ml    Filed Weights   03/07/22 0035 03/07/22 0500  Weight: 87 kg 87.1 kg    Examination:  General: A/O x4, No acute respiratory distress Eyes: negative scleral hemorrhage, negative anisocoria, negative icterus ENT: Negative Runny nose, negative gingival bleeding, Neck:  Negative scars, masses, torticollis, lymphadenopathy, JVD Lungs: Clear to auscultation bilaterally without wheezes or crackles Cardiovascular: Regular rate and rhythm without murmur gallop or rub normal S1 and S2 Abdomen: negative abdominal pain, nondistended, positive soft, bowel sounds, no rebound, no ascites, no appreciable mass Extremities: No significant cyanosis, clubbing, or edema bilateral lower extremities Skin: Negative rashes, lesions, ulcers Psychiatric:  Negative depression, negative anxiety, negative fatigue, negative mania  Central nervous system:  Cranial nerves II through XII intact,  tongue/uvula midline, all extremities muscle strength 5/5, sensation intact throughout, negative dysarthria, negative expressive aphasia, negative receptive aphasia.  .     Data Reviewed: Care during the described time interval was provided by me .  I have reviewed this patient's available data, including medical history, events of note, physical examination, and all test results as part of my evaluation.  CBC: Recent Labs  Lab 03/06/22 1638 03/08/22 0531 03/09/22 0647 03/10/22 0627  WBC 20.5* 17.6* 19.4* 20.9*  NEUTROABS 18.4*  --  16.2* 17.7*  HGB 14.1 12.2 11.5* 12.8  HCT 46.2* 38.1 36.0 40.9  MCV 96.3 91.1 91.6 92.3  PLT 362 325 333 786    Basic Metabolic Panel: Recent Labs  Lab 03/06/22 1638 03/08/22 0531 03/09/22 0647 03/10/22 0627  NA 132* 135 134* 139  K 4.7 4.5 4.6 4.8  CL 98 103 102 103  CO2 21* 23 23 26   GLUCOSE 188* 131* 119* 118*  BUN 11 18 38* 34*  CREATININE 0.93 0.87 1.04* 0.89  CALCIUM 9.5 9.3 8.9 9.7  MG  --   --  2.5* 2.8*  PHOS  --   --  4.2 3.5     GFR: Estimated Creatinine Clearance: 62.6 mL/min (by C-G formula based on SCr of 0.89 mg/dL). Liver Function Tests: Recent Labs  Lab 03/06/22 1638 03/09/22 0647 03/10/22 0627  AST 38 15 17  ALT 18 12 13   ALKPHOS 102 126 145*  BILITOT 1.6* 0.5 0.6  PROT 8.7* 7.4 8.2*  ALBUMIN 4.6 3.6 3.9    No results for input(s): "LIPASE", "AMYLASE" in the last 168 hours. No results for input(s): "AMMONIA" in the last 168 hours. Coagulation Profile: Recent Labs  Lab 03/09/22 0647 03/10/22 0627  INR 1.0 0.9    Cardiac Enzymes: No results for input(s): "CKTOTAL", "CKMB", "CKMBINDEX", "TROPONINI" in the last 168 hours. BNP (last 3 results) No results for input(s): "PROBNP" in the last 8760 hours. HbA1C: No results for input(s): "HGBA1C" in the last 72 hours. CBG: No results for input(s): "GLUCAP" in the last 168 hours. Lipid Profile: No results for input(s): "CHOL", "HDL", "LDLCALC", "TRIG", "CHOLHDL", "LDLDIRECT" in the last 72 hours. Thyroid Function Tests: No results for input(s): "TSH", "T4TOTAL", "FREET4", "T3FREE", "THYROIDAB" in the last 72 hours. Anemia Panel: No results for input(s): "VITAMINB12", "FOLATE", "FERRITIN", "TIBC", "IRON", "RETICCTPCT" in the last 72 hours. Sepsis Labs: No results for input(s): "PROCALCITON", "LATICACIDVEN" in the last 168 hours.  No results found for this or any previous visit (from the past 240 hour(s)).       Radiology Studies: No results found.      Scheduled Meds:  amLODipine  5 mg Oral QPM   [START ON 03/11/2022] aspirin EC  81 mg Oral QHS   dexamethasone  4 mg Oral Q8H   gabapentin  600 mg Oral TID   heparin  5,000 Units Subcutaneous Q8H   lidocaine  2 patch Transdermal Q24H   lisinopril  20 mg Oral q AM   senna-docusate  1 tablet Oral BID   tiZANidine  2 mg Oral QHS   Continuous Infusions:   LOS: 1 day    Time spent:40 min    Dodi Leu, Geraldo Docker, MD [facility]   If 7PM-7AM, please contact  night-coverage 03/10/2022, 9:03 PM

## 2022-03-11 ENCOUNTER — Ambulatory Visit
Admit: 2022-03-11 | Discharge: 2022-03-11 | Disposition: A | Discharge status: Home / Self Care | Payer: Medicare HMO | Attending: Radiation Oncology | Admitting: Radiation Oncology

## 2022-03-11 ENCOUNTER — Encounter (HOSPITAL_COMMUNITY): Payer: Self-pay | Admitting: Internal Medicine

## 2022-03-11 ENCOUNTER — Inpatient Hospital Stay (HOSPITAL_COMMUNITY): Payer: Medicare HMO

## 2022-03-11 ENCOUNTER — Encounter (HOSPITAL_COMMUNITY): Admission: RE | Admit: 2022-03-11 | Payer: Medicare HMO | Source: Ambulatory Visit

## 2022-03-11 DIAGNOSIS — C799 Secondary malignant neoplasm of unspecified site: Secondary | ICD-10-CM | POA: Diagnosis not present

## 2022-03-11 DIAGNOSIS — C7951 Secondary malignant neoplasm of bone: Secondary | ICD-10-CM | POA: Diagnosis not present

## 2022-03-11 DIAGNOSIS — R0781 Pleurodynia: Secondary | ICD-10-CM | POA: Diagnosis not present

## 2022-03-11 DIAGNOSIS — C3491 Malignant neoplasm of unspecified part of right bronchus or lung: Secondary | ICD-10-CM | POA: Diagnosis not present

## 2022-03-11 LAB — COMPREHENSIVE METABOLIC PANEL
ALT: 13 U/L (ref 0–44)
AST: 15 U/L (ref 15–41)
Albumin: 3.5 g/dL (ref 3.5–5.0)
Alkaline Phosphatase: 118 U/L (ref 38–126)
Anion gap: 8 (ref 5–15)
BUN: 30 mg/dL — ABNORMAL HIGH (ref 8–23)
CO2: 24 mmol/L (ref 22–32)
Calcium: 9.3 mg/dL (ref 8.9–10.3)
Chloride: 105 mmol/L (ref 98–111)
Creatinine, Ser: 0.67 mg/dL (ref 0.44–1.00)
GFR, Estimated: >60 mL/min (ref >60)
Glucose, Bld: 128 mg/dL — ABNORMAL HIGH (ref 70–99)
Potassium: 4.8 mmol/L (ref 3.5–5.1)
Sodium: 137 mmol/L (ref 135–145)
Total Bilirubin: 0.7 mg/dL (ref 0.3–1.2)
Total Protein: 7.3 g/dL (ref 6.5–8.1)

## 2022-03-11 LAB — CBC WITH DIFFERENTIAL/PLATELET
Abs Immature Granulocytes: 0.12 10*3/uL — ABNORMAL HIGH (ref 0.00–0.07)
Basophils Absolute: 0 10*3/uL (ref 0.0–0.1)
Basophils Relative: 0 %
Eosinophils Absolute: 0 10*3/uL (ref 0.0–0.5)
Eosinophils Relative: 0 %
HCT: 38.8 % (ref 36.0–46.0)
Hemoglobin: 12.1 g/dL (ref 12.0–15.0)
Immature Granulocytes: 1 %
Lymphocytes Relative: 9 %
Lymphs Abs: 1.5 10*3/uL (ref 0.7–4.0)
MCH: 29.1 pg (ref 26.0–34.0)
MCHC: 31.2 g/dL (ref 30.0–36.0)
MCV: 93.3 fL (ref 80.0–100.0)
Monocytes Absolute: 0.8 10*3/uL (ref 0.1–1.0)
Monocytes Relative: 5 %
Neutro Abs: 14.1 10*3/uL — ABNORMAL HIGH (ref 1.7–7.7)
Neutrophils Relative %: 85 %
Platelets: 375 10*3/uL (ref 150–400)
RBC: 4.16 MIL/uL (ref 3.87–5.11)
RDW: 12.9 % (ref 11.5–15.5)
WBC: 16.6 10*3/uL — ABNORMAL HIGH (ref 4.0–10.5)
nRBC: 0 % (ref 0.0–0.2)

## 2022-03-11 LAB — MAGNESIUM: Magnesium: 2.7 mg/dL — ABNORMAL HIGH (ref 1.7–2.4)

## 2022-03-11 LAB — PROTIME-INR
INR: 1 (ref 0.8–1.2)
Prothrombin Time: 12.9 seconds (ref 11.4–15.2)

## 2022-03-11 LAB — PHOSPHORUS: Phosphorus: 3.3 mg/dL (ref 2.5–4.6)

## 2022-03-11 MED ORDER — SODIUM CHLORIDE 0.9 % IV SOLN
INTRAVENOUS | Status: AC
  Filled 2022-03-11: qty 250

## 2022-03-11 MED ORDER — FENTANYL CITRATE (PF) 100 MCG/2ML IJ SOLN
INTRAMUSCULAR | Status: AC | PRN
  Administered 2022-03-11: 25 ug via INTRAVENOUS
  Administered 2022-03-11: 50 ug via INTRAVENOUS
  Administered 2022-03-11: 25 ug via INTRAVENOUS

## 2022-03-11 MED ORDER — MIDAZOLAM HCL 2 MG/2ML IJ SOLN
INTRAMUSCULAR | Status: AC | PRN
  Administered 2022-03-11 (×2): .5 mg via INTRAVENOUS
  Administered 2022-03-11: 1 mg via INTRAVENOUS

## 2022-03-11 MED ORDER — MIDAZOLAM HCL 2 MG/2ML IJ SOLN
INTRAMUSCULAR | Status: AC
  Filled 2022-03-11: qty 4

## 2022-03-11 MED ORDER — NYSTATIN 100000 UNIT/GM EX OINT
TOPICAL_OINTMENT | Freq: Two times a day (BID) | CUTANEOUS | Status: AC
  Filled 2022-03-11: qty 15

## 2022-03-11 MED ORDER — OXYCODONE HCL 5 MG PO TABS
5.0000 mg | ORAL_TABLET | ORAL | Status: DC | PRN
  Administered 2022-03-12 – 2022-03-16 (×21): 10 mg via ORAL
  Filled 2022-03-11 (×21): qty 2

## 2022-03-11 MED ORDER — NYSTATIN 100000 UNIT/GM EX CREA
TOPICAL_CREAM | Freq: Two times a day (BID) | CUTANEOUS | Status: DC
  Filled 2022-03-11: qty 30

## 2022-03-11 MED ORDER — LIDOCAINE HCL (PF) 1 % IJ SOLN
INTRAMUSCULAR | Status: AC | PRN
  Administered 2022-03-11: 10 mL

## 2022-03-11 MED ORDER — FENTANYL CITRATE (PF) 100 MCG/2ML IJ SOLN
INTRAMUSCULAR | Status: AC
  Filled 2022-03-11: qty 4

## 2022-03-11 NOTE — Progress Notes (Signed)
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 status post lobectomy in [place] in 2021, known renal mass since 2021 has been slowly growing, most recent MRI in August 2023 showed growing in size 2.4 X2.3X 2.6 lateral lower right kidney concerning for possible malignancy, also had enhancement of right side of T9 and L5 for bony mets and is scheduled for PET/CT on 03/11/2022, hypertension, presented w/ worsening right-sided rib pain has been ongoing for the last several months,was recently seen by her PCP that stopped her gabapentin and switched her to Lyrica due to continued pain   Seen in the ED, afebrile initial BP high up to 017 systolic, labs with leukocytosis hyponatremia mildly elevated TB.  CTA chest was negative for PE-showed T9 mets with some soft tissue extension into the central spinal canal causing central spinal canal stenosis. Patient ble to walk but does complain bitterly about her right-sided rib pain. [facility] hospitalist contacted for admission due to uncontrolled pain.   MRI T-L spine done showed "Multifocal osseous metastatic disease in the thoracic and lumbar spine, with the most significant, expansile lesion occupying the majority of the T9 vertebral body and extending into the right greater than left posterior elements and proximal right ninth and tenth ribs. This causes severe spinal canal stenosis, with deformation of the spinal cord but no abnormal cord signal, as well as mild to moderate spinal canal stenosis at T9-T10, moderate neural foraminal narrowing T8-T9, and severe right neural foraminal narrowing at T9-T10.Additional metastatic disease is seen at T1, T8, T10, L3, L4, and L5, as described above. C6-C7 moderate spinal canal stenosis. C7-T1 mild-to-moderate spinal canal stenosis. L3-L4 and L4-L5 narrowing of the lateral recesses, which  could affect the descending L4 and L5 nerve roots, respectively. L1-L2 and L2-L3 mild right neural foraminal narrowing. Indeterminate right renal mass, which is better evaluated on the same day CT abdomen pelvis"    Subjective: 11/27 A/O x4 sitting in bed comfortably states no pain s/p biopsy.  Again attempted to engage with patient concerning a home pain regimen, given that IR may clear her tomorrow for discharge.  Patient became very upset stating she was not going home and that she knew her rights.  Stated she would refused to go home and they would have to carry her out of here and put her in jail.  At that point I conceded to patient that she had the right to refuse discharge however if she was cleared medically it would be referred to the insurance company who she would have to deal with, she became more agitated, stating she did not want to speak anymore about her home pain regimen.  She stated she was going to sit right here in the hospital for her 2 weeks of radiation treatment which is due to start Wednesday..   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 See report of MRI for detail, on Neurontin, lidocaine patch along with p.o. oxy IV Dilaudid for  pain control.  -P.o. oxy is causing patient nausea which is affecting her ability to eat.  Discontinue - Continue IV Dilaudid for pain control will titrate up as required -Consulted Dr. Hilbert Odor, unclear if the cancer in the bone is from her previous lung cancer versus metastatic cancer from the kidney. -R/Rad onc consult per [facility]-contacted by [facility]-starting  on dexamethasone, continue aggressive pain management, bowel regimen.   -Per Dr. Narda Rutherford oncology biopsy has been ordered but not yet performed. -11/26 biopsy is scheduled for 11/27 -11/27 patient s/p biopsy, able to eat and drink will DC all IV pain medication.  PRIMARY TEAM ONLY to manage pain medication.   Right renal mass -Worrisome for renal carcinoma-per onco   Adenocarcinoma the right lung stage II -Followed by Dr. Julien Nordmann on right lobectomy in 2021.   Uncontrolled hypertension:  -Amlodipine 5 mg daily - Lisinopril 20 mg daily    Leukocytosis:  -Chest x-ray CT scan no pneumonia,afebrile, recheck labs, check UA.  Obesity (BMI30.99 kg/m.) -Address with PCP   UTI? - 11/26 many bacteria in urinalysis on UA from 11/23 never sent for culture.  Obtain urine culture         Mobility Assessment (last 72 hours)     Mobility Assessment     Row Name 03/11/22 0758 03/10/22 0843 03/09/22 0745       Does patient have an order for bedrest or is patient medically unstable No - Continue assessment No - Continue assessment No - Continue assessment     What is the highest level of mobility based on the progressive mobility assessment? Level 6 (Walks independently in room and hall) - Balance while walking in room without assist - Complete Level 6 (Walks independently in room and hall) - Balance while walking in room without assist - Complete Level 6 (Walks independently in room and hall) - Balance while walking in room without assist - Complete                      DVT prophylaxis: Heparin subcu Code Status:  Family Communication: Full Status is: Inpatient    Dispo: The patient is from: Home              Anticipated d/c is to: Home              Anticipated d/c date is: > 3 days              Patient currently is not medically stable to d/c.      Consultants:    Procedures/Significant Events:    I have personally reviewed and interpreted all radiology  studies and my findings are as above.  VENTILATOR SETTINGS:    Cultures 11/26  Antimicrobials: Anti-infectives (From admission, onward)    None         Devices    LINES / TUBES:      Continuous Infusions:  sodium chloride       Objective: Vitals:   03/11/22 1235 03/11/22 1240 03/11/22 1245 03/11/22 1333  BP: 125/71 (!) 146/74 (!) 112/57 104/88  Pulse: (!) 57 (!) 57 (!) 57 (!) 56  Resp:  11 11 20   Temp:    98.5 F (36.9 C)  TempSrc:    Oral  SpO2: 100% 100% 100% 99%  Weight:      Height:        Intake/Output Summary (Last 24 hours) at 03/11/2022 1756 Last data filed at 03/11/2022 0201 Gross per 24 hour  Intake 120 ml  Output 900 ml  Net -780 ml    Filed Weights   03/07/22 0035 03/07/22 0500  Weight: 87 kg 87.1 kg    Examination:  General: A/O x4, No acute respiratory distress Eyes: negative scleral hemorrhage, negative anisocoria, negative icterus ENT: Negative Runny nose, negative gingival bleeding, Neck:  Negative scars, masses, torticollis, lymphadenopathy, JVD Lungs: Clear to auscultation bilaterally without wheezes or crackles Cardiovascular: Regular rate and rhythm without murmur gallop or rub normal S1 and S2 Abdomen: negative abdominal pain, nondistended, positive soft, bowel sounds, no rebound, no ascites, no appreciable mass Extremities: No significant cyanosis, clubbing, or edema bilateral lower extremities Skin: Negative rashes, lesions, ulcers Psychiatric:  Negative depression, positive anxiety, negative fatigue, positive mania  Central nervous system:  Cranial nerves II through XII intact, tongue/uvula midline, all extremities muscle strength 5/5, sensation intact throughout, negative dysarthria, negative expressive aphasia, negative receptive aphasia.  .     Data Reviewed: Care during the described time interval was provided by me .  I have reviewed this patient's available data, including medical history, events of note,  physical examination, and all test results as part of my evaluation.  CBC: Recent Labs  Lab 03/06/22 1638 03/08/22 0531 03/09/22 0647 03/10/22 0627 03/11/22 0452  WBC 20.5* 17.6* 19.4* 20.9* 16.6*  NEUTROABS 18.4*  --  16.2* 17.7* 14.1*  HGB 14.1 12.2 11.5* 12.8 12.1  HCT 46.2* 38.1 36.0 40.9 38.8  MCV 96.3 91.1 91.6 92.3 93.3  PLT 362 325 333 379 354    Basic Metabolic Panel: Recent Labs  Lab 03/06/22 1638 03/08/22 0531 03/09/22 0647 03/10/22 0627 03/11/22 0452  NA 132* 135 134* 139 137  K 4.7 4.5 4.6 4.8 4.8  CL 98 103 102 103 105  CO2 21* 23 23 26 24   GLUCOSE 188* 131* 119* 118* 128*  BUN 11 18 38* 34* 30*  CREATININE 0.93 0.87 1.04* 0.89 0.67  CALCIUM 9.5 9.3 8.9 9.7 9.3  MG  --   --  2.5* 2.8* 2.7*  PHOS  --   --  4.2 3.5 3.3    GFR: Estimated Creatinine Clearance: 69.6 mL/min (by C-G formula based on SCr of 0.67 mg/dL). Liver Function Tests: Recent Labs  Lab 03/06/22 1638 03/09/22 0647 03/10/22 0627 03/11/22 0452  AST 38 15 17 15   ALT 18 12 13 13   ALKPHOS 102 126 145* 118  BILITOT 1.6* 0.5 0.6 0.7  PROT 8.7* 7.4 8.2* 7.3  ALBUMIN 4.6 3.6 3.9 3.5    No results for input(s): "LIPASE", "AMYLASE" in the last 168 hours. No results for input(s): "AMMONIA" in the last 168 hours. Coagulation Profile: Recent Labs  Lab 03/09/22 0647 03/10/22 0627 03/11/22 0452  INR 1.0 0.9 1.0    Cardiac Enzymes: No results for input(s): "CKTOTAL", "CKMB", "CKMBINDEX", "TROPONINI" in the last 168 hours. BNP (last 3 results) No results for input(s): "PROBNP" in the last 8760 hours. HbA1C: No results for input(s): "HGBA1C" in the last 72 hours. CBG: No results for input(s): "GLUCAP" in the last 168 hours. Lipid Profile: No results for input(s): "CHOL", "HDL", "LDLCALC", "TRIG", "CHOLHDL", "LDLDIRECT" in the last 72 hours. Thyroid Function Tests: No results for input(s): "TSH", "T4TOTAL", "FREET4", "T3FREE", "THYROIDAB" in the last 72 hours. Anemia Panel: No  results for input(s): "VITAMINB12", "FOLATE", "FERRITIN", "TIBC", "IRON", "RETICCTPCT" in the last 72 hours. Sepsis Labs: No results for input(s): "PROCALCITON", "LATICACIDVEN" in the last 168 hours.  No results found for this or any previous visit (from the  past 240 hour(s)).       Radiology Studies: CT BONE TROCAR/NEEDLE BIOPSY DEEP  Result Date: 03/11/2022 CLINICAL DATA:  Metastatic disease of the spine involving thoracic and lumbar vertebral bodies with history of lung carcinoma. The most significant vertebral lesion is at the T9 level and also involving posterior elements of T9 including the right-sided transverse process and costovertebral junction. EXAM: CT GUIDED CORE BIOPSY OF BODY PART ANESTHESIA/SEDATION: Moderate (conscious) sedation was employed during this procedure. A total of Versed 2.0 mg and Fentanyl 100 mcg was administered intravenously by radiology nursing. Moderate Sedation Time: 17 minutes. The patient's level of consciousness and vital signs were monitored continuously by radiology nursing throughout the procedure under my direct supervision. PROCEDURE: The procedure risks, benefits, and alternatives were explained to the patient. Questions regarding the procedure were encouraged and answered. The patient understands and consents to the procedure. A time-out was performed prior to initiating the procedure. CT was performed through the mid to lower thoracic region in a prone position. The posterior thoracic region was prepped with chlorhexidine in a sterile fashion, and a sterile drape was applied covering the operative field. A sterile gown and sterile gloves were used for the procedure. Local anesthesia was provided with 1% Lidocaine. Under CT guidance, a 17 gauge trocar needle was advanced to the level of a lesion involving the right-sided transverse process and costovertebral junction of the T9 vertebral body. After confirming needle tip position, 3 separate core biopsy  samples were obtained with an 18 gauge biopsy device. Samples were submitted in formalin. Additional CT was performed after outer needle removal. RADIATION DOSE REDUCTION: This exam was performed according to the departmental dose-optimization program which includes automated exposure control, adjustment of the mA and/or kV according to patient size and/or use of iterative reconstruction technique. COMPLICATIONS: None FINDINGS: Tumor involving the T9 vertebral body at the level of right-sided posterior elements was targeted. Fragments of solid tissue were obtained. IMPRESSION: CT-guided core biopsy performed of tumor involving the T9 vertebral body at the level of the right-sided transverse process and right costovertebral junction. Electronically Signed   By: Aletta Edouard M.D.   On: 03/11/2022 14:34        Scheduled Meds:  amLODipine  5 mg Oral QPM   aspirin EC  81 mg Oral QHS   dexamethasone  4 mg Oral Q8H   fentaNYL       gabapentin  600 mg Oral TID   heparin  5,000 Units Subcutaneous Q8H   lidocaine  2 patch Transdermal Q24H   lisinopril  20 mg Oral q AM   midazolam       senna-docusate  1 tablet Oral BID   tiZANidine  2 mg Oral QHS   Continuous Infusions:  sodium chloride       LOS: 2 days    Time spent:40 min    Kentavious Michele, Geraldo Docker, MD [facility]   If 7PM-7AM, please contact night-coverage 03/11/2022, 5:56 PM

## 2022-03-11 NOTE — Progress Notes (Signed)
PT Cancellation Note  Patient Details Name: Brandi Holmes MRN: [id number] DOB: [birth year]   Cancelled Treatment:    Reason Eval/Treat Not Completed: Patient at procedure or test/unavailable [place] Office [phone] Weekend pager-[phone]   Claretha Cooper 03/11/2022, 2:33 PM

## 2022-03-11 NOTE — Procedures (Signed)
Interventional Radiology Procedure Note  Procedure: CT Guided Biopsy of right T9 vertebral lesion  Complications: None  Estimated Blood Loss: < 10 mL  Findings: 18 G core biopsy of T9 lesion performed at level of right transverse process under CT guidance.  Three core samples obtained and sent to Pathology.  Venetia Night. Kathlene Cote, M.D Pager:  [phone]

## 2022-03-11 NOTE — Progress Notes (Signed)
OT Cancellation Note  Patient Details Name: Brandi Holmes MRN: [id number] DOB: 1951-03-02   Cancelled Treatment:    Reason Eval/Treat Not Completed: Patient at procedure or test/ unavailable--out of room for spinal biopsy.  [place], OTR/L [facility] [phone] Office [phone]    Almon Register 03/11/2022, 2:17 PM

## 2022-03-12 DIAGNOSIS — C3491 Malignant neoplasm of unspecified part of right bronchus or lung: Secondary | ICD-10-CM | POA: Diagnosis not present

## 2022-03-12 DIAGNOSIS — R0781 Pleurodynia: Secondary | ICD-10-CM | POA: Diagnosis not present

## 2022-03-12 DIAGNOSIS — C799 Secondary malignant neoplasm of unspecified site: Secondary | ICD-10-CM | POA: Diagnosis not present

## 2022-03-12 DIAGNOSIS — C7951 Secondary malignant neoplasm of bone: Secondary | ICD-10-CM | POA: Diagnosis not present

## 2022-03-12 LAB — PHOSPHORUS: Phosphorus: 3.1 mg/dL (ref 2.5–4.6)

## 2022-03-12 LAB — COMPREHENSIVE METABOLIC PANEL
ALT: 13 U/L (ref 0–44)
AST: 12 U/L — ABNORMAL LOW (ref 15–41)
Albumin: 3.2 g/dL — ABNORMAL LOW (ref 3.5–5.0)
Alkaline Phosphatase: 105 U/L (ref 38–126)
Anion gap: 10 (ref 5–15)
BUN: 24 mg/dL — ABNORMAL HIGH (ref 8–23)
CO2: 23 mmol/L (ref 22–32)
Calcium: 9.1 mg/dL (ref 8.9–10.3)
Chloride: 103 mmol/L (ref 98–111)
Creatinine, Ser: 0.88 mg/dL (ref 0.44–1.00)
GFR, Estimated: >60 mL/min (ref >60)
Glucose, Bld: 124 mg/dL — ABNORMAL HIGH (ref 70–99)
Potassium: 4.9 mmol/L (ref 3.5–5.1)
Sodium: 136 mmol/L (ref 135–145)
Total Bilirubin: 0.6 mg/dL (ref 0.3–1.2)
Total Protein: 6.8 g/dL (ref 6.5–8.1)

## 2022-03-12 LAB — CBC WITH DIFFERENTIAL/PLATELET
Abs Immature Granulocytes: 0.09 10*3/uL — ABNORMAL HIGH (ref 0.00–0.07)
Basophils Absolute: 0 10*3/uL (ref 0.0–0.1)
Basophils Relative: 0 %
Eosinophils Absolute: 0 10*3/uL (ref 0.0–0.5)
Eosinophils Relative: 0 %
HCT: 37.8 % (ref 36.0–46.0)
Hemoglobin: 12 g/dL (ref 12.0–15.0)
Immature Granulocytes: 1 %
Lymphocytes Relative: 10 %
Lymphs Abs: 1.7 10*3/uL (ref 0.7–4.0)
MCH: 29.3 pg (ref 26.0–34.0)
MCHC: 31.7 g/dL (ref 30.0–36.0)
MCV: 92.4 fL (ref 80.0–100.0)
Monocytes Absolute: 1 10*3/uL (ref 0.1–1.0)
Monocytes Relative: 6 %
Neutro Abs: 14.2 10*3/uL — ABNORMAL HIGH (ref 1.7–7.7)
Neutrophils Relative %: 83 %
Platelets: 389 10*3/uL (ref 150–400)
RBC: 4.09 MIL/uL (ref 3.87–5.11)
RDW: 12.8 % (ref 11.5–15.5)
WBC: 17 10*3/uL — ABNORMAL HIGH (ref 4.0–10.5)
nRBC: 0 % (ref 0.0–0.2)

## 2022-03-12 LAB — MAGNESIUM: Magnesium: 2.5 mg/dL — ABNORMAL HIGH (ref 1.7–2.4)

## 2022-03-12 LAB — PROTIME-INR
INR: 1 (ref 0.8–1.2)
Prothrombin Time: 12.6 seconds (ref 11.4–15.2)

## 2022-03-12 NOTE — Care Management Important Message (Signed)
Important Message  Patient Details IM Letter given Name: Brandi Holmes MRN: [id number] Date of Birth: Feb 01, 1951   Medicare Important Message Given:  Yes     Kerin Salen 03/12/2022, 9:16 AM

## 2022-03-12 NOTE — Telephone Encounter (Signed)
Called and spoke with pt about her mychart message. Stated that she has been in the hospital and while in the hospital, a tumor on her spine was found after doing an MRI and when this was found, they admitted her.  Pt said that she had a biopsy performed yesterday 11/27. Pt said the pain management doctor came by to see her and said that things are not connecting with what he has said to her about the medications. Pt said that her oxycodone was discussed by the pain management doctor about possibly discontinuing this once she is discharged home. Pt said that while in the hospital, IV pain meds is the only thing that has been able to help with her pain and she said that at this current moment, she told the pain management doctor that oxycodone would not be touching her pain.  Pt said that she was told by the pain management doctor that since she would not talk with him about medications, when she would be discharged, she would not be going home on any pain medication. Pt said that her IV pain  meds were cut off by the pain management provider overnight 11/27 and she also said that he lowered the dose of her oxycodone. Patient stated that she feels like she is getting bullied by the pain management doctor.   Routing all this to both Dr. Valeta Harms and Tammy for them to review.

## 2022-03-12 NOTE — Progress Notes (Signed)
PT Cancellation Note  Patient Details Name: Brandi Holmes MRN: [id number] DOB: August 01, 1950   Cancelled Treatment:    Reason Eval/Treat Not Completed: PT screened, no needs identified, will sign off Pt reports being independent at baseline and lives in senior living apartment.  Pt reports everything is nearby and she furniture walks if needed.  Pt also reports she can call anyone at any time and someone will come assist her if needed.  Pt declines PT needs at this time.  Pt has been up showering and moving around hospital room when not having too much pain (after taking her pain meds).  Pt requesting pain meds at this time so notified RN.  Since pt declines needs and appears to be mobilizing in room, PT to sign off at this time.   [name] 03/12/2022, 10:32 AM Arlyce Dice, DPT Physical Therapist Acute Rehabilitation Services Preferred contact method: Secure Chat Weekend Pager Only: [phone] Office: [phone]

## 2022-03-12 NOTE — Evaluation (Addendum)
Occupational Therapy Evaluation Patient Details Name: Brandi Holmes MRN: [id number] DOB: 1950/09/10 Today's Date: 03/12/2022   History of Present Illness Patient is a 71 year old female who presented to the hospital with right sided rib pain with recent medication switch with PCP. 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 Patient underwent CT guided biopsy R T9 vertebral lesion on 11/27.PMH: adenocarcinoma of right lung stage II, uncontrolled HTN, obesity,   Clinical Impression   Patient evaluated by Occupational Therapy with no further acute OT needs identified. All education has been completed and the patient has no further questions. Patient reported being at baseline with patient reporting having taken a shower herself during hospital admission. Patient declined to demonstrate ADLs OOB with breakfast arriving during session. Patient reported having various loved ones and neighbors to call if needed for assistance at home. See below for any follow-up Occupational Therapy or equipment needs. OT is signing off. Thank you for this referral.       Recommendations for follow up therapy are one component of a multi-disciplinary discharge planning process, led by the attending physician.  Recommendations may be updated based on patient status, additional functional criteria and insurance authorization.   Follow Up Recommendations  No OT follow up     Assistance Recommended at Discharge Intermittent Supervision/Assistance  Patient can return home with the following Assistance with cooking/housework;Assist for transportation    Functional Status Assessment  Patient has had a recent decline in their functional status and demonstrates the ability to make significant improvements in function in a reasonable and predictable amount of time.  Equipment Recommendations  None recommended by OT    Recommendations  for Other Services       Precautions / Restrictions Restrictions Weight Bearing Restrictions: No      Mobility Bed Mobility Overal bed mobility: Modified Independent             General bed mobility comments: patient is able to move around in bed herself. patients breakfast tray arrived during session with patient declining to participate in OOB tasks. patient reportd taking a shower herself last PM. nursing noted indicate patietn has been independent in ADLs. patient reported she does not need skilled OT services.    Transfers         General transfer comment: patient declined          ADL either performed or assessed with clinical judgement      Vision Baseline Vision/History: 1 Wears glasses Vision Assessment?: No apparent visual deficits            Pertinent Vitals/Pain Pain Assessment Pain Assessment: 0-10 Pain Score: 9  Pain Location: back Pain Descriptors / Indicators: Constant Pain Intervention(s): Limited activity within patient's tolerance, Monitored during session, Premedicated before session     Hand Dominance     Extremity/Trunk Assessment Upper Extremity Assessment Upper Extremity Assessment: Overall WFL for tasks assessed   Lower Extremity Assessment Lower Extremity Assessment: Defer to PT evaluation       Communication Communication Communication: No difficulties   Cognition Arousal/Alertness: Awake/alert Behavior During Therapy: WFL for tasks assessed/performed Overall Cognitive Status: Within Functional Limits for tasks assessed                 General Comments: patient was oriented ot self, place, caregivers and situation.                Home Living Family/patient expects to be discharged  to:: Private residence Living Arrangements: Alone Available Help at Discharge: Family Type of Home: House (senior cottages) Home Access: Stairs to enter     Home Layout: One level     Bathroom Shower/Tub: Musician: None          Prior Functioning/Environment Prior Level of Function : Independent/Modified Independent       ADLs Comments: patient reported having reacher at home and loving tub baths. patient reported strong dislike for showers.        OT Problem List: Pain         OT Goals(Current goals can be found in the care plan section) Acute Rehab OT Goals Patient Stated Goal: to get home by thursday for dentist appointment OT Goal Formulation: All assessment and education complete, DC therapy  OT Frequency:         AM-PAC OT "6 Clicks" Daily Activity     Outcome Measure Help from another person eating meals?: None Help from another person taking care of personal grooming?: None Help from another person toileting, which includes using toliet, bedpan, or urinal?: A Little Help from another person bathing (including washing, rinsing, drying)?: A Little Help from another person to put on and taking off regular upper body clothing?: A Little Help from another person to put on and taking off regular lower body clothing?: A Little 6 Click Score: 20   End of Session Nurse Communication: Other (comment) (ok to participate in session)  Activity Tolerance: Patient tolerated treatment well Patient left: in bed;with call bell/phone within reach  OT Visit Diagnosis: Pain                Time: 9450-3888 OT Time Calculation (min): 20 min Charges:  OT General Charges $OT Visit: 1 Visit OT Evaluation $OT Eval Low Complexity: 1 Low  Caelin Rosen OTR/L, MS Acute Rehabilitation Department Office# [phone]   Willa Rough 03/12/2022, 9:19 AM

## 2022-03-12 NOTE — Progress Notes (Signed)
PROGRESS NOTE    Brandi Holmes  VQM:[id number] DOB: 09-07-1950 DOA: 03/06/2022 PCP: Lin Landsman, MD     Brief Narrative:  13-yof w/ stage II adenocarcinoma of the right lung status post lobectomy in [place] in 2021, known renal mass since 2021 has been slowly growing, most recent MRI in August 2023 showed growing in size 2.4 X2.3X 2.6 lateral lower right kidney concerning for possible malignancy, also had enhancement of right side of T9 and L5 for bony mets and is scheduled for PET/CT on 03/11/2022, hypertension, presented w/ worsening right-sided rib pain has been ongoing for the last several months,was recently seen by her PCP that stopped her gabapentin and switched her to Lyrica due to continued pain   Seen in the ED, afebrile initial BP high up to 932 systolic, labs with leukocytosis hyponatremia mildly elevated TB.  CTA chest was negative for PE-showed T9 mets with some soft tissue extension into the central spinal canal causing central spinal canal stenosis. Patient ble to walk but does complain bitterly about her right-sided rib pain. [facility] hospitalist contacted for admission due to uncontrolled pain.   MRI T-L spine done showed "Multifocal osseous metastatic disease in the thoracic and lumbar spine, with the most significant, expansile lesion occupying the majority of the T9 vertebral body and extending into the right greater than left posterior elements and proximal right ninth and tenth ribs. This causes severe spinal canal stenosis, with deformation of the spinal cord but no abnormal cord signal, as well as mild to moderate spinal canal stenosis at T9-T10, moderate neural foraminal narrowing T8-T9, and severe right neural foraminal narrowing at T9-T10.Additional metastatic disease is seen at T1, T8, T10, L3, L4, and L5, as described above. C6-C7 moderate spinal canal stenosis. C7-T1 mild-to-moderate spinal canal stenosis. L3-L4 and L4-L5 narrowing of the lateral recesses, which  could affect the descending L4 and L5 nerve roots, respectively. L1-L2 and L2-L3 mild right neural foraminal narrowing. Indeterminate right renal mass, which is better evaluated on the same day CT abdomen pelvis"    Subjective: 11/28 A/O x4.  Sitting in a chair comfortably.  RN Helene Kelp present.  Again patient became somewhat agitated when attempting to discuss plan of care.    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 See report of MRI for detail, on Neurontin, lidocaine patch along with p.o. oxy IV Dilaudid for pain control.  -P.o. oxy is causing patient nausea which is affecting her ability to eat.  Discontinue - Continue IV Dilaudid for pain control will titrate up as required -Consulted Dr. Hilbert Odor, unclear if the cancer in the bone is from her previous lung cancer versus metastatic cancer from the kidney. -R/Rad onc consult per [facility]-contacted by [facility]-starting on dexamethasone, continue aggressive pain management, bowel regimen.   -Per Dr. Narda Rutherford oncology biopsy has been ordered but not yet performed. -11/26 biopsy is scheduled for 11/27 -11/27 patient s/p biopsy, able to eat and drink will DC all IV pain medication.  PRIMARY TEAM ONLY to manage pain medication. -11/28 Dr. Pieter Partridge Dawley neurosurgery evaluated patient on 11/24 for possible emergency decompression.  Patient  has no SSX for need of emergency neurosurgery decompression. -11/28 discussed case with Dr. Kyung Rudd radiation oncology requested that we maintain patient in hospital until tomorrow in order to take a look at biopsy.  Dependent  upon findings of biopsy if not amenable to XRT patient will need neurosurgery.  If amenable to XRT will get a dose of XRT and then be discharged.   Right renal mass -Worrisome for renal carcinoma-per onco   Adenocarcinoma the right lung stage II -Followed by Dr. Julien Nordmann on right lobectomy in 2021. -11/28 per Dr. Narda Rutherford oncology Await results of biopsy for more detailed plan regarding treatment moving forward    Uncontrolled hypertension:  -Amlodipine 5 mg daily - Lisinopril 20 mg daily    Leukocytosis:  -Chest x-ray CT scan no pneumonia,afebrile, recheck labs, check UA. -11/28 most likely secondary to metastatic tumor, and reactive secondary to her biopsy.  May be a UTI brewing urine culture pending still  Obesity (BMI30.99 kg/m.) -Address with PCP   UTI? - 11/26 many bacteria in urinalysis on UA from 11/23 never sent for culture.  Obtain urine culture  Goal of care - 11/28 although patient has been counseled by myself as well as nursing staff continues to refuse her heparin VTE.  She has been counseled that given her metastatic cancer she is at high risk for PE/DVT.       Mobility Assessment (last 72 hours)     Mobility Assessment     Row Name 03/11/22 0758 03/10/22 0843         Does patient have an order for bedrest or is patient medically unstable No - Continue assessment No - Continue assessment      What is the highest level of mobility based on the progressive mobility assessment? Level 6 (Walks independently in room and hall) - Balance while walking in room without assist - Complete Level 6 (Walks independently in room and hall) - Balance while walking in room without assist - Complete                       DVT prophylaxis: Heparin subcu Code Status:  Family Communication: Full Status is: Inpatient    Dispo: The patient is from: Home              Anticipated d/c is to: Home              Anticipated d/c date is: > 3 days              Patient  currently is not medically stable to d/c.      Consultants:  Dr. Pieter Partridge Dawley neurosurgery Dr. Kyung Rudd radiation oncology Dr. Narda Rutherford oncology  Procedures/Significant Events:    I have personally reviewed and interpreted all radiology studies and my findings are as above.  VENTILATOR SETTINGS:    Cultures 11/26 urine pending   Antimicrobials: Anti-infectives (From admission, onward)    None         Devices    LINES / TUBES:      Continuous Infusions:    Objective: Vitals:   03/11/22 1245 03/11/22 1333 03/11/22 2248 03/12/22 0611  BP: (!) 112/57 104/88 133/73 (!) 151/66  Pulse: (!) 57 (!) 56 61 (!) 56  Resp: 11 20 17 17   Temp:  98.5 F (36.9 C) 98.9 F (37.2 C) 98.6 F (37 C)  TempSrc:  Oral Oral Oral  SpO2: 100% 99% 99% 100%  Weight:  Height:        Intake/Output Summary (Last 24 hours) at 03/12/2022 0914 Last data filed at 03/12/2022 0700 Gross per 24 hour  Intake --  Output 1100 ml  Net -1100 ml    Filed Weights   03/07/22 0035 03/07/22 0500  Weight: 87 kg 87.1 kg    Examination:  General: A/O x4, No acute respiratory distress Eyes: negative scleral hemorrhage, negative anisocoria, negative icterus ENT: Negative Runny nose, negative gingival bleeding, Neck:  Negative scars, masses, torticollis, lymphadenopathy, JVD Lungs: Clear to auscultation bilaterally without wheezes or crackles Cardiovascular: Regular rate and rhythm without murmur gallop or rub normal S1 and S2 Abdomen: negative abdominal pain, nondistended, positive soft, bowel sounds, no rebound, no ascites, no appreciable mass Extremities: No significant cyanosis, clubbing, or edema bilateral lower extremities Skin: Negative rashes, lesions, ulcers Psychiatric:  Negative depression, positive anxiety, negative fatigue, positive mania  Central nervous system:  Cranial nerves II through XII intact, tongue/uvula midline, all extremities muscle strength 5/5,  sensation intact throughout, negative dysarthria, negative expressive aphasia, negative receptive aphasia.  .     Data Reviewed: Care during the described time interval was provided by me .  I have reviewed this patient's available data, including medical history, events of note, physical examination, and all test results as part of my evaluation.  CBC: Recent Labs  Lab 03/06/22 1638 03/08/22 0531 03/09/22 0647 03/10/22 0627 03/11/22 0452 03/12/22 0548  WBC 20.5* 17.6* 19.4* 20.9* 16.6* 17.0*  NEUTROABS 18.4*  --  16.2* 17.7* 14.1* 14.2*  HGB 14.1 12.2 11.5* 12.8 12.1 12.0  HCT 46.2* 38.1 36.0 40.9 38.8 37.8  MCV 96.3 91.1 91.6 92.3 93.3 92.4  PLT 362 325 333 379 375 892    Basic Metabolic Panel: Recent Labs  Lab 03/08/22 0531 03/09/22 0647 03/10/22 0627 03/11/22 0452 03/12/22 0548  NA 135 134* 139 137 136  K 4.5 4.6 4.8 4.8 4.9  CL 103 102 103 105 103  CO2 23 23 26 24 23   GLUCOSE 131* 119* 118* 128* 124*  BUN 18 38* 34* 30* 24*  CREATININE 0.87 1.04* 0.89 0.67 0.88  CALCIUM 9.3 8.9 9.7 9.3 9.1  MG  --  2.5* 2.8* 2.7* 2.5*  PHOS  --  4.2 3.5 3.3 3.1    GFR: Estimated Creatinine Clearance: 63.3 mL/min (by C-G formula based on SCr of 0.88 mg/dL). Liver Function Tests: Recent Labs  Lab 03/06/22 1638 03/09/22 0647 03/10/22 0627 03/11/22 0452 03/12/22 0548  AST 38 15 17 15  12*  ALT 18 12 13 13 13   ALKPHOS 102 126 145* 118 105  BILITOT 1.6* 0.5 0.6 0.7 0.6  PROT 8.7* 7.4 8.2* 7.3 6.8  ALBUMIN 4.6 3.6 3.9 3.5 3.2*    No results for input(s): "LIPASE", "AMYLASE" in the last 168 hours. No results for input(s): "AMMONIA" in the last 168 hours. Coagulation Profile: Recent Labs  Lab 03/09/22 0647 03/10/22 0627 03/11/22 0452 03/12/22 0548  INR 1.0 0.9 1.0 1.0    Cardiac Enzymes: No results for input(s): "CKTOTAL", "CKMB", "CKMBINDEX", "TROPONINI" in the last 168 hours. BNP (last 3 results) No results for input(s): "PROBNP" in the last 8760  hours. HbA1C: No results for input(s): "HGBA1C" in the last 72 hours. CBG: No results for input(s): "GLUCAP" in the last 168 hours. Lipid Profile: No results for input(s): "CHOL", "HDL", "LDLCALC", "TRIG", "CHOLHDL", "LDLDIRECT" in the last 72 hours. Thyroid Function Tests: No results for input(s): "TSH", "T4TOTAL", "FREET4", "T3FREE", "THYROIDAB" in the last 72 hours. Anemia  Panel: No results for input(s): "VITAMINB12", "FOLATE", "FERRITIN", "TIBC", "IRON", "RETICCTPCT" in the last 72 hours. Sepsis Labs: No results for input(s): "PROCALCITON", "LATICACIDVEN" in the last 168 hours.  No results found for this or any previous visit (from the past 240 hour(s)).       Radiology Studies: CT BONE TROCAR/NEEDLE BIOPSY DEEP  Result Date: 03/11/2022 CLINICAL DATA:  Metastatic disease of the spine involving thoracic and lumbar vertebral bodies with history of lung carcinoma. The most significant vertebral lesion is at the T9 level and also involving posterior elements of T9 including the right-sided transverse process and costovertebral junction. EXAM: CT GUIDED CORE BIOPSY OF BODY PART ANESTHESIA/SEDATION: Moderate (conscious) sedation was employed during this procedure. A total of Versed 2.0 mg and Fentanyl 100 mcg was administered intravenously by radiology nursing. Moderate Sedation Time: 17 minutes. The patient's level of consciousness and vital signs were monitored continuously by radiology nursing throughout the procedure under my direct supervision. PROCEDURE: The procedure risks, benefits, and alternatives were explained to the patient. Questions regarding the procedure were encouraged and answered. The patient understands and consents to the procedure. A time-out was performed prior to initiating the procedure. CT was performed through the mid to lower thoracic region in a prone position. The posterior thoracic region was prepped with chlorhexidine in a sterile fashion, and a sterile drape  was applied covering the operative field. A sterile gown and sterile gloves were used for the procedure. Local anesthesia was provided with 1% Lidocaine. Under CT guidance, a 17 gauge trocar needle was advanced to the level of a lesion involving the right-sided transverse process and costovertebral junction of the T9 vertebral body. After confirming needle tip position, 3 separate core biopsy samples were obtained with an 18 gauge biopsy device. Samples were submitted in formalin. Additional CT was performed after outer needle removal. RADIATION DOSE REDUCTION: This exam was performed according to the departmental dose-optimization program which includes automated exposure control, adjustment of the mA and/or kV according to patient size and/or use of iterative reconstruction technique. COMPLICATIONS: None FINDINGS: Tumor involving the T9 vertebral body at the level of right-sided posterior elements was targeted. Fragments of solid tissue were obtained. IMPRESSION: CT-guided core biopsy performed of tumor involving the T9 vertebral body at the level of the right-sided transverse process and right costovertebral junction. Electronically Signed   By: Aletta Edouard M.D.   On: 03/11/2022 14:34        Scheduled Meds:  amLODipine  5 mg Oral QPM   aspirin EC  81 mg Oral QHS   dexamethasone  4 mg Oral Q8H   gabapentin  600 mg Oral TID   heparin  5,000 Units Subcutaneous Q8H   lidocaine  2 patch Transdermal Q24H   lisinopril  20 mg Oral q AM   nystatin ointment   Topical BID   senna-docusate  1 tablet Oral BID   tiZANidine  2 mg Oral QHS   Continuous Infusions:    LOS: 3 days    Time spent:40 min    Juliah Scadden, Geraldo Docker, MD [facility]   If 7PM-7AM, please contact night-coverage 03/12/2022, 9:14 AM

## 2022-03-12 NOTE — Progress Notes (Signed)
Patient ID: Brandi Holmes, female   DOB: 1951/01/09, 71 y.o.   MRN: [id number] Pt s/p T9 tumor bx yesterday; stable; VSS; afebrile; WBC 17(16.6), hgb stable, T9 path pending; pt without new sig c/o or new neurological changes; puncture site T9 region clean and dry, mildly tender, no obvious bleeding or hematoma; pt cont to have some ant/rt lat chest discomfort which is not new. She states she is getting radiation treatment tomorrow. Await path, oncology f/u.

## 2022-03-12 NOTE — Progress Notes (Addendum)
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 status post lobectomy in [place] in 2021, known renal mass since 2021 has been slowly growing, most recent MRI in August 2023 showed growing in size 2.4 X2.3X 2.6 lateral lower right kidney concerning for possible malignancy, also had enhancement of right side of T9 and L5 for bony mets and is scheduled for PET/CT on 03/11/2022, hypertension, presented w/ worsening right-sided rib pain has been ongoing for the last several months,was recently seen by her PCP that stopped her gabapentin and switched her to Lyrica due to continued pain   Seen in the ED, afebrile initial BP high up to 811 systolic, labs with leukocytosis hyponatremia mildly elevated TB.  CTA chest was negative for PE-showed T9 mets with some soft tissue extension into the central spinal canal causing central spinal canal stenosis. Patient ble to walk but does complain bitterly about her right-sided rib pain. [facility] hospitalist contacted for admission due to uncontrolled pain.   MRI T-L spine done showed "Multifocal osseous metastatic disease in the thoracic and lumbar spine, with the most significant, expansile lesion occupying the majority of the T9 vertebral body and extending into the right greater than left posterior elements and proximal right ninth and tenth ribs. This causes severe spinal canal stenosis, with deformation of the spinal cord but no abnormal cord signal, as well as mild to moderate spinal canal stenosis at T9-T10, moderate neural foraminal narrowing T8-T9, and severe right neural foraminal narrowing at T9-T10.Additional metastatic disease is seen at T1, T8, T10, L3, L4, and L5, as described above. C6-C7 moderate spinal canal stenosis. C7-T1 mild-to-moderate spinal canal stenosis. L3-L4 and L4-L5 narrowing of the lateral recesses, which  could affect the descending L4 and L5 nerve roots, respectively. L1-L2 and L2-L3 mild right neural foraminal narrowing. Indeterminate right renal mass, which is better evaluated on the same day CT abdomen pelvis"    Subjective: 11/27 A/O x4 sitting in bed comfortably states no pain s/p biopsy.  Again attempted to engage with patient concerning a home pain regimen, given that IR may clear her tomorrow for discharge.  Patient became very upset stating she was not going home and that she knew her rights.  Stated she would refused to go home and they would have to carry her out of here and put her in jail.  At that point I conceded to patient that she had the right to refuse discharge however if she was cleared medically it would be referred to the insurance company who she would have to deal with, she became more agitated, stating she did not want to speak anymore about her home pain regimen.  She stated she was going to sit right here in the hospital for her 2 weeks of radiation treatment which is due to start Wednesday.. ADDENDUM: Per [employer] [facility] policy IV narcotics were stopped when patient was able to take p.o. pain medication.  P.o. pain medication regimen was unchanged from when patient had been admitted.   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 possibly from renal mass vs lung cancer   Hypertension   Adenocarcinoma of right lung, stage 2 (HCC)  Cancer associated pain-Rib pain on the right side/Upper back pain Multiple  metastatic disease in thoracic lumbar spine  with uncontrolled pain: -No PE on CTA. See report of MRI for detail, on Neurontin, lidocaine patch along with p.o. oxy IV Dilaudid for pain control.  -P.o. oxy is causing patient nausea which is affecting her ability to eat.  Discontinue - Continue IV Dilaudid for pain control will titrate up as  required -Consulted Dr. Hilbert Odor, unclear if the cancer in the bone is from her previous lung cancer versus metastatic cancer from the kidney. -R/Rad onc consult per [facility]-contacted by [facility]-starting on dexamethasone, continue aggressive pain management, bowel regimen.   -Per Dr. Narda Rutherford oncology biopsy has been ordered but not yet performed. -11/26 biopsy is scheduled for 11/27 -11/27 patient s/p biopsy, able to eat and drink will DC all IV pain medication.  PRIMARY TEAM ONLY to manage pain medication.   Right renal mass -Worrisome for renal carcinoma-per onco   Adenocarcinoma the right lung stage II -Followed by Dr. Julien Nordmann on right lobectomy in 2021.   Uncontrolled hypertension:  -Amlodipine 5 mg daily - Lisinopril 20 mg daily    Leukocytosis:  -Chest x-ray CT scan no pneumonia,afebrile, recheck labs, check UA.  Obesity (BMI30.99 kg/m.) -Address with PCP   UTI? - 11/26 many bacteria in urinalysis on UA from 11/23 never sent for culture.  Obtain urine culture         Mobility Assessment (last 72 hours)     Mobility Assessment     Row Name 03/11/22 0758 03/10/22 0843         Does patient have an order for bedrest or is patient medically unstable No - Continue assessment No - Continue assessment      What is the highest level of mobility based on the progressive mobility assessment? Level 6 (Walks independently in room and hall) - Balance while walking in room without assist - Complete Level 6 (Walks independently in room and hall) - Balance while walking in room without assist - Complete                       DVT prophylaxis: Heparin subcu Code Status:  Family Communication: Full Status is: Inpatient    Dispo: The patient is from: Home              Anticipated d/c is to: Home              Anticipated d/c date is: > 3 days              Patient currently is not medically stable to d/c.      Consultants:    Procedures/Significant Events:     I have personally reviewed and interpreted all radiology studies and my findings are as above.  VENTILATOR SETTINGS:    Cultures 11/26  Antimicrobials: Anti-infectives (From admission, onward)    None         Devices    LINES / TUBES:      Continuous Infusions:    Objective: Vitals:   03/11/22 1245 03/11/22 1333 03/11/22 2248 03/12/22 0611  BP: (!) 112/57 104/88 133/73 (!) 151/66  Pulse: (!) 57 (!) 56 61 (!) 56  Resp: 11 20 17 17   Temp:  98.5 F (36.9 C) 98.9 F (37.2 C) 98.6 F (37 C)  TempSrc:  Oral Oral Oral  SpO2: 100% 99% 99% 100%  Weight:      Height:        Intake/Output Summary (Last 24 hours) at 03/12/2022 0852 Last  data filed at 03/12/2022 0700 Gross per 24 hour  Intake --  Output 1100 ml  Net -1100 ml    Filed Weights   03/07/22 0035 03/07/22 0500  Weight: 87 kg 87.1 kg    Examination:  General: A/O x4, No acute respiratory distress Eyes: negative scleral hemorrhage, negative anisocoria, negative icterus ENT: Negative Runny nose, negative gingival bleeding, Neck:  Negative scars, masses, torticollis, lymphadenopathy, JVD Lungs: Clear to auscultation bilaterally without wheezes or crackles Cardiovascular: Regular rate and rhythm without murmur gallop or rub normal S1 and S2 Abdomen: negative abdominal pain, nondistended, positive soft, bowel sounds, no rebound, no ascites, no appreciable mass Extremities: No significant cyanosis, clubbing, or edema bilateral lower extremities Skin: Negative rashes, lesions, ulcers Psychiatric:  Negative depression, positive anxiety, negative fatigue, positive mania  Central nervous system:  Cranial nerves II through XII intact, tongue/uvula midline, all extremities muscle strength 5/5, sensation intact throughout, negative dysarthria, negative expressive aphasia, negative receptive aphasia.  .     Data Reviewed: Care during the described time interval was provided by me .  I have reviewed  this patient's available data, including medical history, events of note, physical examination, and all test results as part of my evaluation.  CBC: Recent Labs  Lab 03/06/22 1638 03/08/22 0531 03/09/22 0647 03/10/22 0627 03/11/22 0452 03/12/22 0548  WBC 20.5* 17.6* 19.4* 20.9* 16.6* 17.0*  NEUTROABS 18.4*  --  16.2* 17.7* 14.1* 14.2*  HGB 14.1 12.2 11.5* 12.8 12.1 12.0  HCT 46.2* 38.1 36.0 40.9 38.8 37.8  MCV 96.3 91.1 91.6 92.3 93.3 92.4  PLT 362 325 333 379 375 175    Basic Metabolic Panel: Recent Labs  Lab 03/08/22 0531 03/09/22 0647 03/10/22 0627 03/11/22 0452 03/12/22 0548  NA 135 134* 139 137 136  K 4.5 4.6 4.8 4.8 4.9  CL 103 102 103 105 103  CO2 23 23 26 24 23   GLUCOSE 131* 119* 118* 128* 124*  BUN 18 38* 34* 30* 24*  CREATININE 0.87 1.04* 0.89 0.67 0.88  CALCIUM 9.3 8.9 9.7 9.3 9.1  MG  --  2.5* 2.8* 2.7* 2.5*  PHOS  --  4.2 3.5 3.3 3.1    GFR: Estimated Creatinine Clearance: 63.3 mL/min (by C-G formula based on SCr of 0.88 mg/dL). Liver Function Tests: Recent Labs  Lab 03/06/22 1638 03/09/22 0647 03/10/22 0627 03/11/22 0452 03/12/22 0548  AST 38 15 17 15  12*  ALT 18 12 13 13 13   ALKPHOS 102 126 145* 118 105  BILITOT 1.6* 0.5 0.6 0.7 0.6  PROT 8.7* 7.4 8.2* 7.3 6.8  ALBUMIN 4.6 3.6 3.9 3.5 3.2*    No results for input(s): "LIPASE", "AMYLASE" in the last 168 hours. No results for input(s): "AMMONIA" in the last 168 hours. Coagulation Profile: Recent Labs  Lab 03/09/22 0647 03/10/22 0627 03/11/22 0452 03/12/22 0548  INR 1.0 0.9 1.0 1.0    Cardiac Enzymes: No results for input(s): "CKTOTAL", "CKMB", "CKMBINDEX", "TROPONINI" in the last 168 hours. BNP (last 3 results) No results for input(s): "PROBNP" in the last 8760 hours. HbA1C: No results for input(s): "HGBA1C" in the last 72 hours. CBG: No results for input(s): "GLUCAP" in the last 168 hours. Lipid Profile: No results for input(s): "CHOL", "HDL", "LDLCALC", "TRIG", "CHOLHDL",  "LDLDIRECT" in the last 72 hours. Thyroid Function Tests: No results for input(s): "TSH", "T4TOTAL", "FREET4", "T3FREE", "THYROIDAB" in the last 72 hours. Anemia Panel: No results for input(s): "VITAMINB12", "FOLATE", "FERRITIN", "TIBC", "IRON", "RETICCTPCT" in the last 72 hours. Sepsis  Labs: No results for input(s): "PROCALCITON", "LATICACIDVEN" in the last 168 hours.  No results found for this or any previous visit (from the past 240 hour(s)).       Radiology Studies: CT BONE TROCAR/NEEDLE BIOPSY DEEP  Result Date: 03/11/2022 CLINICAL DATA:  Metastatic disease of the spine involving thoracic and lumbar vertebral bodies with history of lung carcinoma. The most significant vertebral lesion is at the T9 level and also involving posterior elements of T9 including the right-sided transverse process and costovertebral junction. EXAM: CT GUIDED CORE BIOPSY OF BODY PART ANESTHESIA/SEDATION: Moderate (conscious) sedation was employed during this procedure. A total of Versed 2.0 mg and Fentanyl 100 mcg was administered intravenously by radiology nursing. Moderate Sedation Time: 17 minutes. The patient's level of consciousness and vital signs were monitored continuously by radiology nursing throughout the procedure under my direct supervision. PROCEDURE: The procedure risks, benefits, and alternatives were explained to the patient. Questions regarding the procedure were encouraged and answered. The patient understands and consents to the procedure. A time-out was performed prior to initiating the procedure. CT was performed through the mid to lower thoracic region in a prone position. The posterior thoracic region was prepped with chlorhexidine in a sterile fashion, and a sterile drape was applied covering the operative field. A sterile gown and sterile gloves were used for the procedure. Local anesthesia was provided with 1% Lidocaine. Under CT guidance, a 17 gauge trocar needle was advanced to the level  of a lesion involving the right-sided transverse process and costovertebral junction of the T9 vertebral body. After confirming needle tip position, 3 separate core biopsy samples were obtained with an 18 gauge biopsy device. Samples were submitted in formalin. Additional CT was performed after outer needle removal. RADIATION DOSE REDUCTION: This exam was performed according to the departmental dose-optimization program which includes automated exposure control, adjustment of the mA and/or kV according to patient size and/or use of iterative reconstruction technique. COMPLICATIONS: None FINDINGS: Tumor involving the T9 vertebral body at the level of right-sided posterior elements was targeted. Fragments of solid tissue were obtained. IMPRESSION: CT-guided core biopsy performed of tumor involving the T9 vertebral body at the level of the right-sided transverse process and right costovertebral junction. Electronically Signed   By: Aletta Edouard M.D.   On: 03/11/2022 14:34        Scheduled Meds:  amLODipine  5 mg Oral QPM   aspirin EC  81 mg Oral QHS   dexamethasone  4 mg Oral Q8H   gabapentin  600 mg Oral TID   heparin  5,000 Units Subcutaneous Q8H   lidocaine  2 patch Transdermal Q24H   lisinopril  20 mg Oral q AM   nystatin ointment   Topical BID   senna-docusate  1 tablet Oral BID   tiZANidine  2 mg Oral QHS   Continuous Infusions:    LOS: 3 days    Time spent:40 min    Presley Gora, Geraldo Docker, MD [facility]   If 7PM-7AM, please contact night-coverage 03/12/2022, 8:52 AM

## 2022-03-13 ENCOUNTER — Other Ambulatory Visit: Payer: Self-pay

## 2022-03-13 ENCOUNTER — Other Ambulatory Visit: Payer: Self-pay | Admitting: Radiation Oncology

## 2022-03-13 ENCOUNTER — Ambulatory Visit
Admit: 2022-03-13 | Discharge: 2022-03-13 | Disposition: A | Discharge status: Home / Self Care | Payer: Medicare HMO | Attending: Radiation Oncology | Admitting: Radiation Oncology

## 2022-03-13 ENCOUNTER — Ambulatory Visit: Payer: Medicare HMO | Admitting: Radiation Oncology

## 2022-03-13 DIAGNOSIS — C7951 Secondary malignant neoplasm of bone: Secondary | ICD-10-CM | POA: Diagnosis not present

## 2022-03-13 DIAGNOSIS — N2889 Other specified disorders of kidney and ureter: Secondary | ICD-10-CM | POA: Insufficient documentation

## 2022-03-13 DIAGNOSIS — D72829 Elevated white blood cell count, unspecified: Secondary | ICD-10-CM | POA: Insufficient documentation

## 2022-03-13 LAB — COMPREHENSIVE METABOLIC PANEL
ALT: 15 U/L (ref 0–44)
AST: 15 U/L (ref 15–41)
Albumin: 3.6 g/dL (ref 3.5–5.0)
Alkaline Phosphatase: 117 U/L (ref 38–126)
Anion gap: 9 (ref 5–15)
BUN: 27 mg/dL — ABNORMAL HIGH (ref 8–23)
CO2: 26 mmol/L (ref 22–32)
Calcium: 9.5 mg/dL (ref 8.9–10.3)
Chloride: 102 mmol/L (ref 98–111)
Creatinine, Ser: 0.85 mg/dL (ref 0.44–1.00)
GFR, Estimated: >60 mL/min (ref >60)
Glucose, Bld: 122 mg/dL — ABNORMAL HIGH (ref 70–99)
Potassium: 5 mmol/L (ref 3.5–5.1)
Sodium: 137 mmol/L (ref 135–145)
Total Bilirubin: 0.8 mg/dL (ref 0.3–1.2)
Total Protein: 7.9 g/dL (ref 6.5–8.1)

## 2022-03-13 LAB — RAD ONC ARIA SESSION SUMMARY
Course Elapsed Days: 0
Plan Fractions Treated to Date: 1
Plan Prescribed Dose Per Fraction: 3 Gy
Plan Total Fractions Prescribed: 10
Plan Total Prescribed Dose: 30 Gy
Reference Point Dosage Given to Date: 3 Gy
Reference Point Session Dosage Given: 3 Gy
Session Number: 1

## 2022-03-13 LAB — PROTIME-INR
INR: 1 (ref 0.8–1.2)
Prothrombin Time: 13.3 seconds (ref 11.4–15.2)

## 2022-03-13 LAB — MAGNESIUM: Magnesium: 2.7 mg/dL — ABNORMAL HIGH (ref 1.7–2.4)

## 2022-03-13 LAB — CBC WITH DIFFERENTIAL/PLATELET
Abs Immature Granulocytes: 0.34 10*3/uL — ABNORMAL HIGH (ref 0.00–0.07)
Basophils Absolute: 0 10*3/uL (ref 0.0–0.1)
Basophils Relative: 0 %
Eosinophils Absolute: 0 10*3/uL (ref 0.0–0.5)
Eosinophils Relative: 0 %
HCT: 44.8 % (ref 36.0–46.0)
Hemoglobin: 13.9 g/dL (ref 12.0–15.0)
Immature Granulocytes: 2 %
Lymphocytes Relative: 11 %
Lymphs Abs: 2 10*3/uL (ref 0.7–4.0)
MCH: 29 pg (ref 26.0–34.0)
MCHC: 31 g/dL (ref 30.0–36.0)
MCV: 93.3 fL (ref 80.0–100.0)
Monocytes Absolute: 1 10*3/uL (ref 0.1–1.0)
Monocytes Relative: 6 %
Neutro Abs: 14.4 10*3/uL — ABNORMAL HIGH (ref 1.7–7.7)
Neutrophils Relative %: 81 %
Platelets: 428 10*3/uL — ABNORMAL HIGH (ref 150–400)
RBC: 4.8 MIL/uL (ref 3.87–5.11)
RDW: 12.7 % (ref 11.5–15.5)
WBC: 17.7 10*3/uL — ABNORMAL HIGH (ref 4.0–10.5)
nRBC: 0 % (ref 0.0–0.2)

## 2022-03-13 LAB — URINE CULTURE: Culture: NO GROWTH

## 2022-03-13 LAB — SURGICAL PATHOLOGY

## 2022-03-13 LAB — PHOSPHORUS: Phosphorus: 3.8 mg/dL (ref 2.5–4.6)

## 2022-03-13 MED ORDER — HYDROMORPHONE HCL 1 MG/ML IJ SOLN
0.5000 mg | Freq: Once | INTRAMUSCULAR | Status: AC
  Administered 2022-03-14: 0.5 mg via INTRAVENOUS
  Filled 2022-03-13: qty 0.5

## 2022-03-13 MED ORDER — DEXAMETHASONE 4 MG PO TABS: 4.0000 mg | ORAL_TABLET | Freq: Three times a day (TID) | ORAL | 1 refills | Status: DC

## 2022-03-13 NOTE — Progress Notes (Signed)
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 CTA chest was negative for PE-showed T9 mets with some soft tissue extension into the central spinal canal causing central spinal canal stenosis. [facility] hospitalist contacted for admission due to uncontrolled pain.  Patient underwent bone biopsy 11/27.  Neurosurgery consulted, no role for decompression at this time.  Radiation oncology consulted.  New events last 24 hours / Subjective: Patient continues to complain of pain, just received some pain medication this morning.  Also complaining of lower abdomen pain at site of previous heparin injections.  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 -Bone biopsy 11/27, pathology showed metastatic adenocarcinoma  -Radiation oncology following -Pain control  Stage IV adenocarcinoma of right lung Right renal mass -Follow-up with oncology  Hypertension -Amlodipine, lisinopril  Leukocytosis -Insetting of Decadron use, monitor  Asymptomatic bacteriuria -Urine culture negative    DVT prophylaxis:  SCDs Start: 03/06/22 2323  Code  Status: Full code Family Communication: No family at bedside Disposition Plan:  Status is: Inpatient Remains inpatient appropriate because: Starting radiation treatment, likely discharge home 11/30   Antimicrobials:  Anti-infectives (From admission, onward)    None        Objective: Vitals:   03/12/22 0611 03/12/22 1318 03/12/22 2021 03/13/22 0335  BP: (!) 151/66 114/61 130/65 118/68  Pulse: (!) 56 68 65 (!) 53  Resp: 17 16 18 18   Temp: 98.6 F (37 C) 98.3 F (36.8 C) 98.5 F (36.9 C) 97.6 F (36.4 C)  TempSrc: Oral Oral Oral Oral  SpO2: 100% 98% 99% 100%  Weight:      Height:        Intake/Output Summary (Last 24 hours) at 03/13/2022 1235 Last data filed at 03/13/2022 0900 Gross per 24 hour  Intake 600 ml  Output 900 ml  Net -300 ml   Filed Weights   03/07/22 0035 03/07/22 0500  Weight: 87 kg 87.1 kg    Examination:  General exam: Appears comfortable  Respiratory system: Clear to auscultation. Respiratory effort normal. No respiratory distress. No conversational dyspnea.  Cardiovascular system: S1 & S2 heard, RRR. No murmurs. No pedal edema. Gastrointestinal system: Abdomen is nondistended, soft, bruising lower abdominal wall  Central nervous system: Alert and oriented. No focal neurological deficits. Speech clear.  Extremities: Symmetric in appearance  Skin: No rashes, lesions or ulcers on exposed skin  Psychiatry: Judgement and insight appear normal. Mood & affect appropriate.   Data Reviewed: I have personally reviewed following labs and imaging studies  CBC: Recent Labs  Lab 03/09/22 0647 03/10/22 0627 03/11/22 0452 03/12/22 0548 03/13/22 0631  WBC  19.4* 20.9* 16.6* 17.0* 17.7*  NEUTROABS 16.2* 17.7* 14.1* 14.2* 14.4*  HGB 11.5* 12.8 12.1 12.0 13.9  HCT 36.0 40.9 38.8 37.8 44.8  MCV 91.6 92.3 93.3 92.4 93.3  PLT 333 379 375 389 267*   Basic Metabolic Panel: Recent Labs  Lab 03/09/22 0647 03/10/22 0627 03/11/22 0452 03/12/22 0548  03/13/22 0631  NA 134* 139 137 136 137  K 4.6 4.8 4.8 4.9 5.0  CL 102 103 105 103 102  CO2 23 26 24 23 26   GLUCOSE 119* 118* 128* 124* 122*  BUN 38* 34* 30* 24* 27*  CREATININE 1.04* 0.89 0.67 0.88 0.85  CALCIUM 8.9 9.7 9.3 9.1 9.5  MG 2.5* 2.8* 2.7* 2.5* 2.7*  PHOS 4.2 3.5 3.3 3.1 3.8   GFR: Estimated Creatinine Clearance: 65.5 mL/min (by C-G formula based on SCr of 0.85 mg/dL). Liver Function Tests: Recent Labs  Lab 03/09/22 0647 03/10/22 0627 03/11/22 0452 03/12/22 0548 03/13/22 0631  AST 15 17 15  12* 15  ALT 12 13 13 13 15   ALKPHOS 126 145* 118 105 117  BILITOT 0.5 0.6 0.7 0.6 0.8  PROT 7.4 8.2* 7.3 6.8 7.9  ALBUMIN 3.6 3.9 3.5 3.2* 3.6   No results for input(s): "LIPASE", "AMYLASE" in the last 168 hours. No results for input(s): "AMMONIA" in the last 168 hours. Coagulation Profile: Recent Labs  Lab 03/09/22 0647 03/10/22 0627 03/11/22 0452 03/12/22 0548 03/13/22 0631  INR 1.0 0.9 1.0 1.0 1.0   Cardiac Enzymes: No results for input(s): "CKTOTAL", "CKMB", "CKMBINDEX", "TROPONINI" in the last 168 hours. BNP (last 3 results) No results for input(s): "PROBNP" in the last 8760 hours. HbA1C: No results for input(s): "HGBA1C" in the last 72 hours. CBG: No results for input(s): "GLUCAP" in the last 168 hours. Lipid Profile: No results for input(s): "CHOL", "HDL", "LDLCALC", "TRIG", "CHOLHDL", "LDLDIRECT" in the last 72 hours. Thyroid Function Tests: No results for input(s): "TSH", "T4TOTAL", "FREET4", "T3FREE", "THYROIDAB" in the last 72 hours. Anemia Panel: No results for input(s): "VITAMINB12", "FOLATE", "FERRITIN", "TIBC", "IRON", "RETICCTPCT" in the last 72 hours. Sepsis Labs: No results for input(s): "PROCALCITON", "LATICACIDVEN" in the last 168 hours.  Recent Results (from the past 240 hour(s))  Urine Culture     Status: None   Collection Time: 03/11/22  7:55 PM   Specimen: Urine, Clean Catch  Result Value Ref Range Status   Specimen Description    Final    URINE, CLEAN CATCH Performed at [facility], [place] [address] [place], [place] [address]    Special Requests   Final    Immunocompromised Performed at [facility], [place] [address], [place], [place] [address]    Culture   Final    NO GROWTH Performed at [facility], [place] [address], [place],  [address]    Report Status 03/13/2022 FINAL  Final      Radiology Studies: CT BONE TROCAR/NEEDLE BIOPSY DEEP  Result Date: 03/11/2022 CLINICAL DATA:  Metastatic disease of the spine involving thoracic and lumbar vertebral bodies with history of lung carcinoma. The most significant vertebral lesion is at the T9 level and also involving posterior elements of T9 including the right-sided transverse process and costovertebral junction. EXAM: CT GUIDED CORE BIOPSY OF BODY PART ANESTHESIA/SEDATION: Moderate (conscious) sedation was employed during this procedure. A total of Versed 2.0 mg and Fentanyl 100 mcg was administered intravenously by radiology nursing. Moderate Sedation Time: 17 minutes. The patient's level of consciousness and vital signs were monitored  continuously by radiology nursing throughout the procedure under my direct supervision. PROCEDURE: The procedure risks, benefits, and alternatives were explained to the patient. Questions regarding the procedure were encouraged and answered. The patient understands and consents to the procedure. A time-out was performed prior to initiating the procedure. CT was performed through the mid to lower thoracic region in a prone position. The posterior thoracic region was prepped with chlorhexidine in a sterile fashion, and a sterile drape was applied covering the operative field. A sterile gown and sterile gloves were used for the procedure. Local anesthesia was provided with 1% Lidocaine. Under CT guidance, a 17 gauge trocar needle was advanced to the level of a lesion involving the right-sided  transverse process and costovertebral junction of the T9 vertebral body. After confirming needle tip position, 3 separate core biopsy samples were obtained with an 18 gauge biopsy device. Samples were submitted in formalin. Additional CT was performed after outer needle removal. RADIATION DOSE REDUCTION: This exam was performed according to the departmental dose-optimization program which includes automated exposure control, adjustment of the mA and/or kV according to patient size and/or use of iterative reconstruction technique. COMPLICATIONS: None FINDINGS: Tumor involving the T9 vertebral body at the level of right-sided posterior elements was targeted. Fragments of solid tissue were obtained. IMPRESSION: CT-guided core biopsy performed of tumor involving the T9 vertebral body at the level of the right-sided transverse process and right costovertebral junction. Electronically Signed   By: Aletta Edouard M.D.   On: 03/11/2022 14:34      Scheduled Meds:  amLODipine  5 mg Oral QPM   aspirin EC  81 mg Oral QHS   dexamethasone  4 mg Oral Q8H   gabapentin  600 mg Oral TID   lidocaine  2 patch Transdermal Q24H   lisinopril  20 mg Oral q AM   senna-docusate  1 tablet Oral BID   tiZANidine  2 mg Oral QHS   Continuous Infusions:   LOS: 4 days     Dessa Phi, DO [facility] 03/13/2022, 12:35 PM   Available via Epic secure chat 7am-7pm After these hours, please refer to coverage provider listed on [email or web address]

## 2022-03-13 NOTE — Progress Notes (Signed)
I spoke with the patient to review her biopsy results which confirm metastatic lung adenocarcinoma. She will proceed with radiation tomorrow, and Dr. Reatha Armour is recommending holding off on surgery unless she acutely worsened neurologically in the coming days. The patient is in agreement with this plan. We would recommend continuing Dexamethasone 4 mg TID, and we will taper her steroids at the conclusion of her radiation.      Carola Rhine, PAC

## 2022-03-14 ENCOUNTER — Other Ambulatory Visit: Payer: Self-pay

## 2022-03-14 ENCOUNTER — Ambulatory Visit
Admit: 2022-03-14 | Discharge: 2022-03-14 | Disposition: A | Discharge status: Home / Self Care | Payer: Medicare HMO | Attending: Radiation Oncology | Admitting: Radiation Oncology

## 2022-03-14 DIAGNOSIS — C7951 Secondary malignant neoplasm of bone: Secondary | ICD-10-CM | POA: Diagnosis not present

## 2022-03-14 LAB — RAD ONC ARIA SESSION SUMMARY
Course Elapsed Days: 1
Plan Fractions Treated to Date: 2
Plan Prescribed Dose Per Fraction: 3 Gy
Plan Total Fractions Prescribed: 10
Plan Total Prescribed Dose: 30 Gy
Reference Point Dosage Given to Date: 6 Gy
Reference Point Session Dosage Given: 3 Gy
Session Number: 2

## 2022-03-14 MED ORDER — MELATONIN 5 MG PO TABS
5.0000 mg | ORAL_TABLET | Freq: Every evening | ORAL | Status: DC | PRN
  Administered 2022-03-15: 5 mg via ORAL
  Filled 2022-03-14 (×2): qty 1

## 2022-03-14 NOTE — Progress Notes (Signed)
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 CTA chest was negative for PE-showed T9 mets with some soft tissue extension into the central spinal canal causing central spinal canal stenosis. [facility] hospitalist contacted for admission due to uncontrolled pain.  Patient underwent bone biopsy 11/27.  Neurosurgery consulted, no role for decompression at this time.  Radiation oncology consulted, radiation treatment started 11/29.  New events last 24 hours / Subjective: In good spirits overall, tolerated first radiation treatment yesterday without any issues.  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 -Bone biopsy 11/27, pathology showed metastatic adenocarcinoma  -Radiation oncology following, started radiation treatment 11/29 -Pain control, dexamethasone 4 mg 3 times daily and taper as outpatient  Stage IV adenocarcinoma of right lung Right renal mass -Follow-up with oncology  Hypertension -Amlodipine, lisinopril  Leukocytosis -Insetting of Decadron use, monitor  Asymptomatic bacteriuria -Urine culture negative    DVT  prophylaxis:  SCDs Start: 03/06/22 2323  Code Status: Full code Family Communication: No family at bedside Disposition Plan:  Status is: Inpatient Remains inpatient appropriate because: Starting radiation treatment, likely discharge home 12/2   Antimicrobials:  Anti-infectives (From admission, onward)    None        Objective: Vitals:   03/13/22 0335 03/13/22 1302 03/13/22 2006 03/14/22 0445  BP: 118/68 115/65 138/68 120/63  Pulse: (!) 53 (!) 55 90 (!) 51  Resp: 18 16 16 18   Temp: 97.6 F (36.4 C) 98.1 F (36.7 C) 98.2 F (36.8 C) 97.8 F (36.6 C)  TempSrc: Oral Oral Oral Oral  SpO2: 100% 98% 99% 97%  Weight:      Height:       No intake or output data in the 24 hours ending 03/14/22 1323  Filed Weights   03/07/22 0035 03/07/22 0500  Weight: 87 kg 87.1 kg    Examination:  General exam: Appears comfortable  Respiratory system: Clear to auscultation. Respiratory effort normal. No respiratory distress. No conversational dyspnea.  Cardiovascular system: S1 & S2 heard, RRR. No murmurs. No pedal edema. Gastrointestinal system: Abdomen is nondistended, soft Central nervous system: Alert and oriented. No focal neurological deficits. Speech clear.  Extremities: Symmetric in appearance  Skin: No rashes, lesions or ulcers on exposed skin  Psychiatry: Judgement and insight appear normal. Mood & affect appropriate.   Data Reviewed: I have personally reviewed following labs and imaging studies  CBC: Recent Labs  Lab 03/09/22 0647 03/10/22 0627 03/11/22 0452 03/12/22 0548 03/13/22 0631  WBC 19.4* 20.9* 16.6* 17.0* 17.7*  NEUTROABS 16.2* 17.7* 14.1* 14.2* 14.4*  HGB 11.5* 12.8 12.1 12.0 13.9  HCT  36.0 40.9 38.8 37.8 44.8  MCV 91.6 92.3 93.3 92.4 93.3  PLT 333 379 375 389 428*    Basic Metabolic Panel: Recent Labs  Lab 03/09/22 0647 03/10/22 0627 03/11/22 0452 03/12/22 0548 03/13/22 0631  NA 134* 139 137 136 137  K 4.6 4.8 4.8 4.9 5.0  CL 102 103 105 103  102  CO2 23 26 24 23 26   GLUCOSE 119* 118* 128* 124* 122*  BUN 38* 34* 30* 24* 27*  CREATININE 1.04* 0.89 0.67 0.88 0.85  CALCIUM 8.9 9.7 9.3 9.1 9.5  MG 2.5* 2.8* 2.7* 2.5* 2.7*  PHOS 4.2 3.5 3.3 3.1 3.8    GFR: Estimated Creatinine Clearance: 65.5 mL/min (by C-G formula based on SCr of 0.85 mg/dL). Liver Function Tests: Recent Labs  Lab 03/09/22 0647 03/10/22 0627 03/11/22 0452 03/12/22 0548 03/13/22 0631  AST 15 17 15  12* 15  ALT 12 13 13 13 15   ALKPHOS 126 145* 118 105 117  BILITOT 0.5 0.6 0.7 0.6 0.8  PROT 7.4 8.2* 7.3 6.8 7.9  ALBUMIN 3.6 3.9 3.5 3.2* 3.6    No results for input(s): "LIPASE", "AMYLASE" in the last 168 hours. No results for input(s): "AMMONIA" in the last 168 hours. Coagulation Profile: Recent Labs  Lab 03/09/22 0647 03/10/22 0627 03/11/22 0452 03/12/22 0548 03/13/22 0631  INR 1.0 0.9 1.0 1.0 1.0    Cardiac Enzymes: No results for input(s): "CKTOTAL", "CKMB", "CKMBINDEX", "TROPONINI" in the last 168 hours. BNP (last 3 results) No results for input(s): "PROBNP" in the last 8760 hours. HbA1C: No results for input(s): "HGBA1C" in the last 72 hours. CBG: No results for input(s): "GLUCAP" in the last 168 hours. Lipid Profile: No results for input(s): "CHOL", "HDL", "LDLCALC", "TRIG", "CHOLHDL", "LDLDIRECT" in the last 72 hours. Thyroid Function Tests: No results for input(s): "TSH", "T4TOTAL", "FREET4", "T3FREE", "THYROIDAB" in the last 72 hours. Anemia Panel: No results for input(s): "VITAMINB12", "FOLATE", "FERRITIN", "TIBC", "IRON", "RETICCTPCT" in the last 72 hours. Sepsis Labs: No results for input(s): "PROCALCITON", "LATICACIDVEN" in the last 168 hours.  Recent Results (from the past 240 hour(s))  Urine Culture     Status: None   Collection Time: 03/11/22  7:55 PM   Specimen: Urine, Clean Catch  Result Value Ref Range Status   Specimen Description   Final    URINE, CLEAN CATCH Performed at [facility], [place]  [address], [place], [place] [address]    Special Requests   Final    Immunocompromised Performed at [facility], [place] [address], [place], [place] [address]    Culture   Final    NO GROWTH Performed at [facility], [place] [address] [place],  [address]    Report Status 03/13/2022 FINAL  Final      Radiology Studies: No results found.    Scheduled Meds:  amLODipine  5 mg Oral QPM   aspirin EC  81 mg Oral QHS   dexamethasone  4 mg Oral Q8H   gabapentin  600 mg Oral TID   lidocaine  2 patch Transdermal Q24H   lisinopril  20 mg Oral q AM   senna-docusate  1 tablet Oral BID   tiZANidine  2 mg Oral QHS   Continuous Infusions:   LOS: 5 days     Dessa Phi, DO [facility] 03/14/2022, 1:23 PM   Available via Epic secure chat 7am-7pm After these hours, please refer to coverage provider listed on [email or web address]

## 2022-03-14 NOTE — TOC Progression Note (Signed)
Transition of Care [facility]) - Progression Note    Patient Details  Name: Brandi Holmes MRN: [id number] Date of Birth: 1951/04/04  Transition of Care [facility]) CM/SW [place], RN Phone Number:[phone]  03/14/2022, 3:19 PM  Clinical Narrative:    TOC continues to follow. No needs identified. Tentative plan is for radiation today and tomorrow with plan for discharge home on Sat.         Expected Discharge Plan and Services                                                 Social Determinants of Health (SDOH) Interventions    Readmission Risk Interventions     No data to display

## 2022-03-15 ENCOUNTER — Ambulatory Visit
Admit: 2022-03-15 | Discharge: 2022-03-15 | Disposition: A | Discharge status: Home / Self Care | Payer: Medicare HMO | Attending: Radiation Oncology | Admitting: Radiation Oncology

## 2022-03-15 ENCOUNTER — Ambulatory Visit
Admission: RE | Admit: 2022-03-15 | Discharge: 2022-03-15 | Disposition: A | Discharge status: Home / Self Care | Payer: Medicare HMO | Source: Ambulatory Visit | Attending: Radiation Oncology | Admitting: Radiation Oncology

## 2022-03-15 ENCOUNTER — Other Ambulatory Visit: Payer: Self-pay

## 2022-03-15 DIAGNOSIS — Z510 Encounter for antineoplastic radiation therapy: Secondary | ICD-10-CM | POA: Insufficient documentation

## 2022-03-15 DIAGNOSIS — C7951 Secondary malignant neoplasm of bone: Secondary | ICD-10-CM | POA: Insufficient documentation

## 2022-03-15 DIAGNOSIS — C3411 Malignant neoplasm of upper lobe, right bronchus or lung: Secondary | ICD-10-CM | POA: Insufficient documentation

## 2022-03-15 DIAGNOSIS — Z87891 Personal history of nicotine dependence: Secondary | ICD-10-CM | POA: Insufficient documentation

## 2022-03-15 LAB — RAD ONC ARIA SESSION SUMMARY
Course Elapsed Days: 2
Plan Fractions Treated to Date: 3
Plan Prescribed Dose Per Fraction: 3 Gy
Plan Total Fractions Prescribed: 10
Plan Total Prescribed Dose: 30 Gy
Reference Point Dosage Given to Date: 9 Gy
Reference Point Session Dosage Given: 3 Gy
Session Number: 3

## 2022-03-15 NOTE — Progress Notes (Signed)
Mobility Specialist - Progress Note   03/15/22 1245  Mobility  Activity Ambulated with assistance in hallway  Level of Assistance Independent after set-up  Assistive Device None  Distance Ambulated (ft) 400 ft  Activity Response Tolerated well  Mobility Referral Yes  $Mobility charge 1 Mobility   Pt received in bed and agreed to mobility after some convincing. Pt had slight pain transitioning from sit to stand. Pt returned to bed with all needs met.   Roderick Pee Mobility Specialist

## 2022-03-15 NOTE — Care Management Important Message (Signed)
Important Message  Patient Details IM Letter placed in Patients room. Name: Brandi Holmes MRN: [id number] Date of Birth: [birth year]   Medicare Important Message Given:  Yes     Kerin Salen 03/15/2022, 11:39 AM

## 2022-03-15 NOTE — Progress Notes (Signed)
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 CTA chest was negative for PE-showed T9 mets with some soft tissue extension into the central spinal canal causing central spinal canal stenosis. [facility] hospitalist contacted for admission due to uncontrolled pain.  Patient underwent bone biopsy 11/27.  Neurosurgery consulted, no role for decompression at this time.  Radiation oncology consulted, radiation treatment started 11/29.  New events last 24 hours / Subjective: Doing well today.  Tolerated yesterday's radiation treatment.  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 -Bone biopsy 11/27, pathology showed metastatic adenocarcinoma  -Radiation oncology following, started radiation treatment 11/29 -Pain control, dexamethasone 4 mg 3 times daily and taper as outpatient  Stage IV adenocarcinoma of right lung Right renal mass -Follow-up with oncology  Hypertension -Amlodipine, lisinopril  Leukocytosis -Insetting of Decadron use, monitor  Asymptomatic bacteriuria -Urine culture negative    DVT  prophylaxis:  SCDs Start: 03/06/22 2323  Code Status: Full code Family Communication: No family at bedside Disposition Plan:  Status is: Inpatient Remains inpatient appropriate because: Starting radiation treatment, likely discharge home 12/2   Antimicrobials:  Anti-infectives (From admission, onward)    None        Objective: Vitals:   03/14/22 0445 03/14/22 1630 03/14/22 2053 03/15/22 0528  BP: 120/63 (!) 128/50 116/72 122/86  Pulse: (!) 51 71 65 (!) 55  Resp: 18 18 14 14   Temp: 97.8 F (36.6 C) 98.8 F (37.1 C) 99.2 F (37.3 C) (!) 97.5 F (36.4 C)  TempSrc: Oral Oral Oral Oral  SpO2: 97% 100% 99% 99%  Weight:    81.8 kg  Height:        Intake/Output Summary (Last 24 hours) at 03/15/2022 1206 Last data filed at 03/15/2022 0900 Gross per 24 hour  Intake 480 ml  Output --  Net 480 ml    Filed Weights   03/07/22 0035 03/07/22 0500 03/15/22 0528  Weight: 87 kg 87.1 kg 81.8 kg    Examination:  General exam: Appears comfortable  Respiratory system: Clear to auscultation. Respiratory effort normal. No respiratory distress. No conversational dyspnea.  Cardiovascular system: S1 & S2 heard, RRR. No murmurs. No pedal edema. Gastrointestinal system: Abdomen is nondistended, soft Central nervous system: Alert and oriented. No focal neurological deficits. Speech clear.  Extremities: Symmetric in appearance  Skin: No rashes, lesions or ulcers on exposed skin  Psychiatry: Judgement and insight appear normal. Mood & affect appropriate.   Data Reviewed: I have personally reviewed following labs and imaging studies  CBC: Recent Labs  Lab 03/09/22 0647 03/10/22 0627 03/11/22  3086 03/12/22 0548 03/13/22 0631  WBC 19.4* 20.9* 16.6* 17.0* 17.7*  NEUTROABS 16.2* 17.7* 14.1* 14.2* 14.4*  HGB 11.5* 12.8 12.1 12.0 13.9  HCT 36.0 40.9 38.8 37.8 44.8  MCV 91.6 92.3 93.3 92.4 93.3  PLT 333 379 375 389 428*    Basic Metabolic Panel: Recent Labs  Lab 03/09/22 0647  03/10/22 0627 03/11/22 0452 03/12/22 0548 03/13/22 0631  NA 134* 139 137 136 137  K 4.6 4.8 4.8 4.9 5.0  CL 102 103 105 103 102  CO2 23 26 24 23 26   GLUCOSE 119* 118* 128* 124* 122*  BUN 38* 34* 30* 24* 27*  CREATININE 1.04* 0.89 0.67 0.88 0.85  CALCIUM 8.9 9.7 9.3 9.1 9.5  MG 2.5* 2.8* 2.7* 2.5* 2.7*  PHOS 4.2 3.5 3.3 3.1 3.8    GFR: Estimated Creatinine Clearance: 63.6 mL/min (by C-G formula based on SCr of 0.85 mg/dL). Liver Function Tests: Recent Labs  Lab 03/09/22 0647 03/10/22 0627 03/11/22 0452 03/12/22 0548 03/13/22 0631  AST 15 17 15  12* 15  ALT 12 13 13 13 15   ALKPHOS 126 145* 118 105 117  BILITOT 0.5 0.6 0.7 0.6 0.8  PROT 7.4 8.2* 7.3 6.8 7.9  ALBUMIN 3.6 3.9 3.5 3.2* 3.6    No results for input(s): "LIPASE", "AMYLASE" in the last 168 hours. No results for input(s): "AMMONIA" in the last 168 hours. Coagulation Profile: Recent Labs  Lab 03/09/22 0647 03/10/22 0627 03/11/22 0452 03/12/22 0548 03/13/22 0631  INR 1.0 0.9 1.0 1.0 1.0    Cardiac Enzymes: No results for input(s): "CKTOTAL", "CKMB", "CKMBINDEX", "TROPONINI" in the last 168 hours. BNP (last 3 results) No results for input(s): "PROBNP" in the last 8760 hours. HbA1C: No results for input(s): "HGBA1C" in the last 72 hours. CBG: No results for input(s): "GLUCAP" in the last 168 hours. Lipid Profile: No results for input(s): "CHOL", "HDL", "LDLCALC", "TRIG", "CHOLHDL", "LDLDIRECT" in the last 72 hours. Thyroid Function Tests: No results for input(s): "TSH", "T4TOTAL", "FREET4", "T3FREE", "THYROIDAB" in the last 72 hours. Anemia Panel: No results for input(s): "VITAMINB12", "FOLATE", "FERRITIN", "TIBC", "IRON", "RETICCTPCT" in the last 72 hours. Sepsis Labs: No results for input(s): "PROCALCITON", "LATICACIDVEN" in the last 168 hours.  Recent Results (from the past 240 hour(s))  Urine Culture     Status: None   Collection Time: 03/11/22  7:55 PM   Specimen: Urine, Clean Catch  Result  Value Ref Range Status   Specimen Description   Final    URINE, CLEAN CATCH Performed at [facility], [place] [address] [place], [place] [address]    Special Requests   Final    Immunocompromised Performed at [facility], [place] [address] [place], [place] [address]    Culture   Final    NO GROWTH Performed at [facility], [place] [address] [place], [place] [address]    Report Status 03/13/2022 FINAL  Final      Radiology Studies: No results found.    Scheduled Meds:  amLODipine  5 mg Oral QPM   aspirin EC  81 mg Oral QHS   dexamethasone  4 mg Oral Q8H   gabapentin  600 mg Oral TID   lidocaine  2 patch Transdermal Q24H   lisinopril  20 mg Oral q AM   senna-docusate  1 tablet Oral BID   tiZANidine  2 mg Oral QHS   Continuous Infusions:   LOS: 6 days     Dessa Phi, DO [facility] 03/15/2022, 12:06 PM   Available via Epic secure chat 7am-7pm After these hours, please refer to coverage provider listed on [email or web address]

## 2022-03-16 DIAGNOSIS — C7951 Secondary malignant neoplasm of bone: Secondary | ICD-10-CM | POA: Diagnosis not present

## 2022-03-16 NOTE — Discharge Summary (Addendum)
Physician Discharge Summary  Brandi Holmes TKW:[id number] DOB: [birth year] DOA: 03/06/2022  PCP: Lin Landsman, MD  Admit date: 03/06/2022 Discharge date: 03/16/2022  Admitted From: Home Disposition:  Home  Recommendations for Outpatient Follow-up:  Follow up with PCP  Follow-up with Dr. Julien Nordmann  Discharge Condition: Stable CODE STATUS: Full  Diet recommendation:  Diet Orders (From admission, onward)     Start     Ordered   03/16/22 0000  Diet - low sodium heart healthy        03/16/22 1110   03/11/22 1405  Diet Heart Room service appropriate? Yes; Fluid consistency: Thin  Diet effective now       Question Answer Comment  Room service appropriate? 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 CTA chest was negative for PE-showed T9 mets with some soft tissue extension into the central spinal canal causing central spinal canal stenosis. [facility] hospitalist contacted for admission due to uncontrolled pain.  Patient underwent bone biopsy 11/27.  Neurosurgery consulted, no role for decompression at this time.  Radiation oncology consulted, radiation treatment started 11/29.   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 -Bone biopsy 11/27, pathology showed metastatic adenocarcinoma  -Radiation oncology following, started radiation treatment 11/29 -Pain control, dexamethasone 4 mg 3 times daily and taper as outpatient   Stage IV adenocarcinoma of right lung Right renal mass -Follow-up with oncology   Hypertension -Amlodipine, lisinopril   Leukocytosis -Insetting of Decadron use, monitor   Asymptomatic bacteriuria -Urine culture negative  Discharge Instructions  Discharge Instructions     Call MD for:  difficulty breathing, headache or visual disturbances   Complete by: As directed    Call MD for:  extreme fatigue   Complete by: As directed    Call MD for:  persistant dizziness or light-headedness   Complete by: As directed    Call MD for:  persistant nausea and vomiting   Complete by: As directed    Call MD for:  redness, tenderness, or signs of infection (pain, swelling, redness, odor or green/yellow discharge around incision site)   Complete by: As directed    Call MD for:  severe uncontrolled pain   Complete by: As directed    Call MD for:  temperature >100.4   Complete by: As directed    Diet - low sodium heart healthy   Complete by: As directed    Discharge instructions   Complete by: As directed    You were cared for by a hospitalist during your hospital stay. If you have any questions about your discharge medications or the care you  received while you were in the hospital after you are discharged, you can call the unit and ask to speak with the hospitalist on call if the hospitalist that took care of you is not available. Once you are discharged, your primary care physician will handle any further medical issues. Please note that NO REFILLS for any discharge medications will be authorized once you are discharged, as it is imperative that you return to your primary care physician (or establish a relationship with a primary care  physician if you do not have one) for your aftercare needs so that they can reassess your need for medications and monitor your lab values.   Increase activity slowly   Complete by: As directed    No wound care   Complete by: As directed       Allergies as of 03/16/2022       Reactions   Crab [shellfish Allergy] Swelling   Swelling of feet   Morphine And Related Other (See Comments)   Nightmares         Medication List     STOP taking these medications    POTASSIMIN PO       TAKE these medications    amLODipine 5 MG tablet Commonly known as: NORVASC Take 1 tablet (5 mg total) by mouth every evening. What changed: when to take this   aspirin EC 81 MG tablet Take 81 mg by mouth at bedtime. Swallow whole. What changed: Another medication with the same name was removed. Continue taking this medication, and follow the directions you see here.   atorvastatin 20 MG tablet Commonly known as: LIPITOR Take 1 tablet (20 mg total) by mouth at bedtime.   calcium carbonate 1500 (600 Ca) MG Tabs tablet Commonly known as: OSCAL Take 600 mg of elemental calcium by mouth daily. When able to remember   clobetasol ointment 0.05 % Commonly known as: TEMOVATE Apply 1 application  topically as needed (rash).   dexamethasone 4 MG tablet Commonly known as: DECADRON Take 1 tablet (4 mg total) by mouth 3 (three) times daily.   gabapentin 600 MG tablet Commonly known as: NEURONTIN Take 600 mg by mouth 3 (three) times daily.   lisinopril 20 MG tablet Commonly known as: ZESTRIL Take 20 mg by mouth in the morning.   MAGNESIUM PO Take 1 tablet by mouth daily. When able to remember   nystatin-triamcinolone ointment Commonly known as: MYCOLOG Apply 1 application  topically daily.   oxybutynin 5 MG tablet Commonly known as: DITROPAN Take 5 mg by mouth as needed for bladder spasms.   pregabalin 50 MG capsule Commonly known as: LYRICA Take 50 mg by mouth 2 (two) times daily.    tiZANidine 2 MG tablet Commonly known as: ZANAFLEX Take 2 mg by mouth at bedtime.   traMADol 50 MG tablet Commonly known as: ULTRAM Take 50 mg by mouth every 6 (six) hours as needed for severe pain. Every 8 hours        Follow-up Information     Lin Landsman, MD Follow up.   Specialty: Family Medicine Contact information: [place] [address] [phone]                Allergies  Allergen Reactions   Otho Darner Allergy] Swelling    Swelling of feet   Morphine And Related Other (See Comments)    Nightmares      Procedures/Studies: CT BONE TROCAR/NEEDLE BIOPSY DEEP  Result Date: 03/11/2022 CLINICAL DATA:  Metastatic disease of the spine involving thoracic and lumbar vertebral bodies with history of lung carcinoma. The most significant vertebral lesion is at the T9 level and also involving posterior elements of T9 including the right-sided transverse process and costovertebral junction. EXAM: CT GUIDED CORE BIOPSY OF BODY PART ANESTHESIA/SEDATION: Moderate (conscious) sedation was employed during this procedure. A total of Versed 2.0 mg and Fentanyl 100 mcg was administered intravenously by radiology nursing. Moderate Sedation Time: 17 minutes. The patient's level of consciousness and vital signs were monitored continuously by radiology nursing throughout the procedure under my direct supervision. PROCEDURE: The procedure risks, benefits, and alternatives were explained to the patient. Questions regarding the procedure were encouraged and answered. The patient understands and consents to the procedure. A time-out was performed prior to initiating the procedure. CT was performed through the mid to lower thoracic region in a prone position. The posterior thoracic region was prepped with chlorhexidine in a sterile fashion, and a sterile drape was applied covering the operative field. A sterile gown and sterile gloves were used for the procedure. Local  anesthesia was provided with 1% Lidocaine. Under CT guidance, a 17 gauge trocar needle was advanced to the level of a lesion involving the right-sided transverse process and costovertebral junction of the T9 vertebral body. After confirming needle tip position, 3 separate core biopsy samples were obtained with an 18 gauge biopsy device. Samples were submitted in formalin. Additional CT was performed after outer needle removal. RADIATION DOSE REDUCTION: This exam was performed according to the departmental dose-optimization program which includes automated exposure control, adjustment of the mA and/or kV according to patient size and/or use of iterative reconstruction technique. COMPLICATIONS: None FINDINGS: Tumor involving the T9 vertebral body at the level of right-sided posterior elements was targeted. Fragments of solid tissue were obtained. IMPRESSION: CT-guided core biopsy performed of tumor involving the T9 vertebral body at the level of the right-sided transverse process and right costovertebral junction. Electronically Signed   By: Aletta Edouard M.D.   On: 03/11/2022 14:34   MR BRAIN W WO CONTRAST  Result Date: 03/08/2022 CLINICAL DATA:  Metastatic disease evaluation. EXAM: MRI HEAD WITHOUT AND WITH CONTRAST TECHNIQUE: Multiplanar, multiecho pulse sequences of the brain and surrounding structures were obtained without and with intravenous contrast. CONTRAST:  82mL GADAVIST GADOBUTROL 1 MMOL/ML IV SOLN COMPARISON:  Head CT 02/16/2005 FINDINGS: Brain: There is no evidence of an acute infarct, intracranial hemorrhage, mass, midline shift, or extra-axial fluid collection. There is mild cerebral atrophy. Small T2 hyperintensities in the cerebral white matter bilaterally are nonspecific but compatible with minimal chronic small vessel ischemic disease. No abnormal enhancement is identified. Vascular: Major intracranial vascular flow voids are preserved. Skull and upper cervical spine: 1.3 cm sclerotic focus  in the right frontal calvarium, not felt to reflect active metastatic disease as this was also present in 2006 and is without appreciable enhancement. Disc degeneration and asymmetric left-sided facet arthrosis at C3-4 and C4-5. Sinuses/Orbits: Unremarkable orbits. Paranasal sinuses and mastoid air cells are clear. Other: None. IMPRESSION: No evidence of intracranial metastases. Electronically Signed   By: Logan Bores M.D.   On: 03/08/2022 14:30   MR THORACIC SPINE W WO CONTRAST  Result Date: 03/07/2022 CLINICAL DATA:  Metastatic disease evaluation EXAM: MRI THORACIC AND LUMBAR SPINE WITHOUT AND WITH CONTRAST TECHNIQUE: Multiplanar and multiecho pulse sequences of the thoracic and lumbar spine were obtained without and with intravenous contrast. CONTRAST:  44mL GADAVIST GADOBUTROL 1 MMOL/ML IV SOLN COMPARISON:  No prior MRI,  correlation is made with same day CT chest abdomen pelvis. FINDINGS: MRI THORACIC SPINE FINDINGS Alignment: S shaped curvature of the thoracolumbar spine. 3 mm anterolisthesis of C7 on T1. No significant listhesis in the thoracic spine. Vertebrae: Abnormal marrow signal in the T1 vertebral body, extending into the pedicles; in the spinous process and left pedicle of T8 (series 17, images 8 and 11); in the posterior aspect of T9 and in the posterior elements, described in more detail below; at the superior aspect of T10 and in the spinous process (series 17, images 10 and 11). The majority of the T9 vertebral body is occupied by an expansile lesion, which extends into the right greater than left posterior elements and proximal right ninth and tenth ribs (series 30, image 32), as well as likely the right eighth rib. This circumferentially narrows the spinal canal, causing severe spinal canal stenosis posterior to the T9 vertebral body, as well as moderate right neural foraminal narrowing at T8-T9 and severe right neural foraminal narrowing at T9-T10. While the majority of the aforementioned  lesions do not demonstrate significant enhancement, the T9 lesion and its components in the right ninth and tenth ribs do enhance. Cord: Deformation of the spinal cord posterior to the T9 vertebral body. No abnormal spinal cord signal. Otherwise normal morphology. Paraspinal and other soft tissues: No acute finding. Hepatic cysts, which are better evaluate CT. Disc levels: C6-C7: Moderate spinal canal stenosis. C7-T1: Mild-to-moderate spinal canal stenosis. T8-T9: No spinal canal stenosis. Moderate right neural foraminal narrowing. T9: Severe spinal canal stenosis. T9-T10: Mild to moderate spinal canal stenosis. Severe right neural foraminal narrowing. MRI LUMBAR SPINE FINDINGS Segmentation:  5 lumbar type vertebral bodies. Alignment: S shaped curvature of the thoracolumbar spine. Trace anterolisthesis of L3 on L4. Vertebrae: Abnormal marrow signal in the left posterior aspect of L3, involving the anterior aspect of the pedicle (series 23, image 11). Abnormal signal involving the right greater than left posterior elements of L4 (series 23, images 5-12). Abnormal signal involving the majority of the L5 vertebral body. The pedicles do not appear to be significantly involved, although there is some abnormal signal in the right inferior to killer facet (series 23, image 7). Conus medullaris: Extends to the L2 level and appears normal. No definite abnormal enhancement. Several nerve roots at the level of L5-S1 demonstrate mildly increased T1 signal (series 27, image 31). Paraspinal and other soft tissues: Indeterminate right renal mass, which is better evaluated on the same day CT abdomen pelvis. Disc levels: T12-L1: No significant disc bulge. No spinal canal stenosis or neural foraminal narrowing. L1-L2: No significant disc bulge. Mild facet arthropathy. No spinal canal stenosis. Mild right neural foraminal narrowing. L2-L3: Mild disc bulge. Mild facet arthropathy. Ligamentum flavum hypertrophy. Borderline spinal canal  stenosis. Mild right neural foraminal narrowing. L3-L4: Trace anterolisthesis with mild disc bulge and disc unroofing. Mild-to-moderate facet arthropathy. Ligamentum flavum hypertrophy. Narrowing of the lateral recesses. No spinal canal stenosis or neural foraminal narrowing. L4-L5: Mild disc bulge. Mild facet arthropathy. Ligamentum flavum hypertrophy. Narrowing of the lateral recesses. No spinal canal stenosis. No neural foraminal narrowing. Disc height loss with right eccentric disc osteophyte complex. No spinal canal stenosis. No definite neural foraminal narrowing. L5-S1: No significant disc bulge. No spinal canal stenosis or neural foraminal narrowing. IMPRESSION: 1. Multifocal osseous metastatic disease in the thoracic and lumbar spine, with the most significant, expansile lesion occupying the majority of the T9 vertebral body and extending into the right greater than left posterior elements and proximal  right ninth and tenth ribs. This causes severe spinal canal stenosis, with deformation of the spinal cord but no abnormal cord signal, as well as mild to moderate spinal canal stenosis at T9-T10, moderate neural foraminal narrowing T8-T9, and severe right neural foraminal narrowing at T9-T10. 2. Additional metastatic disease is seen at T1, T8, T10, L3, L4, and L5, as described above. 3. C6-C7 moderate spinal canal stenosis. 4. C7-T1 mild-to-moderate spinal canal stenosis. 5. L3-L4 and L4-L5 narrowing of the lateral recesses, which could affect the descending L4 and L5 nerve roots, respectively. 6. L1-L2 and L2-L3 mild right neural foraminal narrowing. 7. Indeterminate right renal mass, which is better evaluated on the same day CT abdomen pelvis. Electronically Signed   By: Merilyn Baba M.D.   On: 03/07/2022 00:03   MR Lumbar Spine W Wo Contrast  Result Date: 03/07/2022 CLINICAL DATA:  Metastatic disease evaluation EXAM: MRI THORACIC AND LUMBAR SPINE WITHOUT AND WITH CONTRAST TECHNIQUE: Multiplanar and  multiecho pulse sequences of the thoracic and lumbar spine were obtained without and with intravenous contrast. CONTRAST:  64mL GADAVIST GADOBUTROL 1 MMOL/ML IV SOLN COMPARISON:  No prior MRI, correlation is made with same day CT chest abdomen pelvis. FINDINGS: MRI THORACIC SPINE FINDINGS Alignment: S shaped curvature of the thoracolumbar spine. 3 mm anterolisthesis of C7 on T1. No significant listhesis in the thoracic spine. Vertebrae: Abnormal marrow signal in the T1 vertebral body, extending into the pedicles; in the spinous process and left pedicle of T8 (series 17, images 8 and 11); in the posterior aspect of T9 and in the posterior elements, described in more detail below; at the superior aspect of T10 and in the spinous process (series 17, images 10 and 11). The majority of the T9 vertebral body is occupied by an expansile lesion, which extends into the right greater than left posterior elements and proximal right ninth and tenth ribs (series 30, image 32), as well as likely the right eighth rib. This circumferentially narrows the spinal canal, causing severe spinal canal stenosis posterior to the T9 vertebral body, as well as moderate right neural foraminal narrowing at T8-T9 and severe right neural foraminal narrowing at T9-T10. While the majority of the aforementioned lesions do not demonstrate significant enhancement, the T9 lesion and its components in the right ninth and tenth ribs do enhance. Cord: Deformation of the spinal cord posterior to the T9 vertebral body. No abnormal spinal cord signal. Otherwise normal morphology. Paraspinal and other soft tissues: No acute finding. Hepatic cysts, which are better evaluate CT. Disc levels: C6-C7: Moderate spinal canal stenosis. C7-T1: Mild-to-moderate spinal canal stenosis. T8-T9: No spinal canal stenosis. Moderate right neural foraminal narrowing. T9: Severe spinal canal stenosis. T9-T10: Mild to moderate spinal canal stenosis. Severe right neural foraminal  narrowing. MRI LUMBAR SPINE FINDINGS Segmentation:  5 lumbar type vertebral bodies. Alignment: S shaped curvature of the thoracolumbar spine. Trace anterolisthesis of L3 on L4. Vertebrae: Abnormal marrow signal in the left posterior aspect of L3, involving the anterior aspect of the pedicle (series 23, image 11). Abnormal signal involving the right greater than left posterior elements of L4 (series 23, images 5-12). Abnormal signal involving the majority of the L5 vertebral body. The pedicles do not appear to be significantly involved, although there is some abnormal signal in the right inferior to killer facet (series 23, image 7). Conus medullaris: Extends to the L2 level and appears normal. No definite abnormal enhancement. Several nerve roots at the level of L5-S1 demonstrate mildly increased T1 signal (series  27, image 31). Paraspinal and other soft tissues: Indeterminate right renal mass, which is better evaluated on the same day CT abdomen pelvis. Disc levels: T12-L1: No significant disc bulge. No spinal canal stenosis or neural foraminal narrowing. L1-L2: No significant disc bulge. Mild facet arthropathy. No spinal canal stenosis. Mild right neural foraminal narrowing. L2-L3: Mild disc bulge. Mild facet arthropathy. Ligamentum flavum hypertrophy. Borderline spinal canal stenosis. Mild right neural foraminal narrowing. L3-L4: Trace anterolisthesis with mild disc bulge and disc unroofing. Mild-to-moderate facet arthropathy. Ligamentum flavum hypertrophy. Narrowing of the lateral recesses. No spinal canal stenosis or neural foraminal narrowing. L4-L5: Mild disc bulge. Mild facet arthropathy. Ligamentum flavum hypertrophy. Narrowing of the lateral recesses. No spinal canal stenosis. No neural foraminal narrowing. Disc height loss with right eccentric disc osteophyte complex. No spinal canal stenosis. No definite neural foraminal narrowing. L5-S1: No significant disc bulge. No spinal canal stenosis or neural  foraminal narrowing. IMPRESSION: 1. 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 This causes severe spinal canal stenosis, with deformation of the spinal cord but no abnormal cord signal, as well as mild to moderate spinal canal stenosis at T9-T10, moderate neural foraminal narrowing T8-T9, and severe right neural foraminal narrowing at T9-T10. 2. Additional metastatic disease is seen at T1, T8, T10, L3, L4, and L5, as described above. 3. C6-C7 moderate spinal canal stenosis. 4. C7-T1 mild-to-moderate spinal canal stenosis. 5. L3-L4 and L4-L5 narrowing of the lateral recesses, which could affect the descending L4 and L5 nerve roots, respectively. 6. L1-L2 and L2-L3 mild right neural foraminal narrowing. 7. Indeterminate right renal mass, which is better evaluated on the same day CT abdomen pelvis. Electronically Signed   By: Merilyn Baba M.D.   On: 03/07/2022 00:03   CT Angio Chest Pulmonary Embolism (PE) W or WO Contrast  Result Date: 03/06/2022 CLINICAL DATA:  Shortness of breath with chest pain. Acute abdominal pain. History of non-small cell lung cancer status post right lobectomy. EXAM: CT ANGIOGRAPHY CHEST CT ABDOMEN AND PELVIS WITH CONTRAST TECHNIQUE: Multidetector CT imaging of the chest was performed using the standard protocol during bolus administration of intravenous contrast. Multiplanar CT image reconstructions and MIPs were obtained to evaluate the vascular anatomy. Multidetector CT imaging of the abdomen and pelvis was performed using the standard protocol during bolus administration of intravenous contrast. RADIATION DOSE REDUCTION: This exam was performed according to the departmental dose-optimization program which includes automated exposure control, adjustment of the  mA and/or kV according to patient size and/or use of iterative reconstruction technique. CONTRAST:  135mL OMNIPAQUE IOHEXOL 350 MG/ML SOLN COMPARISON:  MRI abdomen 12/02/2021. CT chest 08/13/2021. chest CT 02/24/2020. FINDINGS: CTA CHEST FINDINGS Cardiovascular: Satisfactory opacification of the pulmonary arteries to the segmental level. No evidence of pulmonary embolism. Normal heart size. No pericardial effusion. Mediastinum/Nodes: No enlarged mediastinal, hilar, or axillary lymph nodes. Thyroid gland, trachea, and esophagus demonstrate no significant findings. Lungs/Pleura: Right upper lobectomy changes are again seen. There is stable scarring and bronchiectasis in the right upper lobe. There is some scattered peripheral reticular opacities which are nonspecific throughout both lower lungs. There is no focal lung infiltrate, pleural effusion or pneumothorax. There is a 3 mm nodule in the left upper lobe image 5/1 which is unchanged from most recent prior. This is new compared to 2021. No new pulmonary  nodules are seen. No pleural effusion or pneumothorax. Musculoskeletal: There is new metastatic disease in the posterior elements T9, right greater than left, extending into the posterior right ninth rib. There is soft tissue extension into the central spinal canal causing central spinal canal stenosis. There are healed posterior right rib fractures. Metastatic osseous lesion in the T1 vertebral body has increased in size now measuring 14 mm (previously 8 mm). Review of the MIP images confirms the above findings. CT ABDOMEN and PELVIS FINDINGS Hepatobiliary: Scattered rounded hypodense lesions in the liver are unchanged and favored as small cysts. No new liver lesions. Gallbladder and bile ducts are within normal limits. Pancreas: Unremarkable. No pancreatic ductal dilatation or surrounding inflammatory changes. Spleen: Normal in size without focal abnormality. Adrenals/Urinary Tract: Rounded right renal mass  measuring 2.2 cm is unchanged. Otherwise, adrenal glands, kidneys and bladder are within normal limits. Stomach/Bowel: Stomach is within normal limits. Appendix appears normal. No evidence of bowel wall thickening, distention, or inflammatory changes. There is a large amount of stool throughout the colon. Vascular/Lymphatic: Aortic atherosclerosis. No enlarged abdominal or pelvic lymph nodes. Reproductive: Status post hysterectomy. No adnexal masses. Other: No ascites or free air. Musculoskeletal: Sclerotic osseous metastatic disease has increased in the spine, most significantly at the L5 level measuring up to 4.5 cm (previously 3.1 cm). No acute fractures are seen. Review of the MIP images confirms the above findings. IMPRESSION: 1. No evidence for pulmonary embolism. 2. Stable right upper lobectomy changes. 3. Stable 3 mm left upper lobe pulmonary nodule. 4. New metastatic disease in the posterior elements of T9 with soft tissue extension into the central spinal canal causing central spinal canal stenosis. Correlate for signs of cord compression. This can be further evaluated with MRI. 5. Worsening osseous metastatic disease. 6. No acute localizing process in the abdomen or pelvis. 7. Stable right renal mass worrisome for renal cell carcinoma. Aortic Atherosclerosis (ICD10-I70.0). Electronically Signed   By: Ronney Asters M.D.   On: 03/06/2022 20:14   CT ABDOMEN PELVIS W CONTRAST  Result Date: 03/06/2022 CLINICAL DATA:  Shortness of breath with chest pain. Acute abdominal pain. History of non-small cell lung cancer status post right lobectomy. EXAM: CT ANGIOGRAPHY CHEST CT ABDOMEN AND PELVIS WITH CONTRAST TECHNIQUE: Multidetector CT imaging of the chest was performed using the standard protocol during bolus administration of intravenous contrast. Multiplanar CT image reconstructions and MIPs were obtained to evaluate the vascular anatomy. Multidetector CT imaging of the abdomen and pelvis was performed using  the standard protocol during bolus administration of intravenous contrast. RADIATION DOSE REDUCTION: This exam was performed according to the departmental dose-optimization program which includes automated exposure control, adjustment of the mA and/or kV according to patient size and/or use of iterative reconstruction technique. CONTRAST:  165mL OMNIPAQUE IOHEXOL 350 MG/ML SOLN COMPARISON:  MRI abdomen 12/02/2021. CT chest 08/13/2021. chest CT 02/24/2020. FINDINGS: CTA CHEST FINDINGS Cardiovascular: Satisfactory opacification of the pulmonary arteries to the segmental level. No evidence of pulmonary embolism. Normal heart size. No pericardial effusion. Mediastinum/Nodes: No enlarged mediastinal, hilar, or axillary lymph nodes. Thyroid gland, trachea, and esophagus demonstrate no significant findings. Lungs/Pleura: Right upper lobectomy changes are again seen. There is stable scarring and bronchiectasis in the right upper lobe. There is some scattered peripheral reticular opacities which are nonspecific throughout both lower lungs. There is no focal lung infiltrate, pleural effusion or pneumothorax. There is a 3 mm nodule in the left upper lobe image 5/1 which is unchanged from most recent prior. This is  new compared to 2021. No new pulmonary nodules are seen. No pleural effusion or pneumothorax. Musculoskeletal: There is new metastatic disease in the posterior elements T9, right greater than left, extending into the posterior right ninth rib. There is soft tissue extension into the central spinal canal causing central spinal canal stenosis. There are healed posterior right rib fractures. Metastatic osseous lesion in the T1 vertebral body has increased in size now measuring 14 mm (previously 8 mm). Review of the MIP images confirms the above findings. CT ABDOMEN and PELVIS FINDINGS Hepatobiliary: Scattered rounded hypodense lesions in the liver are unchanged and favored as small cysts. No new liver lesions.  Gallbladder and bile ducts are within normal limits. Pancreas: Unremarkable. No pancreatic ductal dilatation or surrounding inflammatory changes. Spleen: Normal in size without focal abnormality. Adrenals/Urinary Tract: Rounded right renal mass measuring 2.2 cm is unchanged. Otherwise, adrenal glands, kidneys and bladder are within normal limits. Stomach/Bowel: Stomach is within normal limits. Appendix appears normal. No evidence of bowel wall thickening, distention, or inflammatory changes. There is a large amount of stool throughout the colon. Vascular/Lymphatic: Aortic atherosclerosis. No enlarged abdominal or pelvic lymph nodes. Reproductive: Status post hysterectomy. No adnexal masses. Other: No ascites or free air. Musculoskeletal: Sclerotic osseous metastatic disease has increased in the spine, most significantly at the L5 level measuring up to 4.5 cm (previously 3.1 cm). No acute fractures are seen. Review of the MIP images confirms the above findings. IMPRESSION: 1. No evidence for pulmonary embolism. 2. Stable right upper lobectomy changes. 3. Stable 3 mm left upper lobe pulmonary nodule. 4. New metastatic disease in the posterior elements of T9 with soft tissue extension into the central spinal canal causing central spinal canal stenosis. Correlate for signs of cord compression. This can be further evaluated with MRI. 5. Worsening osseous metastatic disease. 6. No acute localizing process in the abdomen or pelvis. 7. Stable right renal mass worrisome for renal cell carcinoma. Aortic Atherosclerosis (ICD10-I70.0). Electronically Signed   By: Ronney Asters M.D.   On: 03/06/2022 20:14   DG Chest 2 View  Result Date: 03/06/2022 CLINICAL DATA:  Chest pain. EXAM: CHEST - 2 VIEW COMPARISON:  06/15/2020 FINDINGS: The cardiac silhouette, mediastinal and hilar contours are within normal limits and stable. Stable tortuosity of the thoracic aorta. Chronic emphysematous changes and remote surgical changes  involving the right lung. Probable radiation changes also. No acute pulmonary findings or worrisome pulmonary lesions. IMPRESSION: Chronic lung changes but no acute pulmonary findings. Electronically Signed   By: Marijo Sanes M.D.   On: 03/06/2022 17:20       Discharge Exam: Vitals:   03/15/22 2141 03/16/22 0510  BP: 139/63 (!) 128/58  Pulse: 63 (!) 56  Resp: 14 14  Temp: 98.3 F (36.8 C) 97.7 F (36.5 C)  SpO2: 97% 100%    General: Pt is alert, awake, not in acute distress Cardiovascular: RRR, S1/S2 +, no edema Respiratory: CTA bilaterally, no wheezing, no rhonchi, no respiratory distress, no conversational dyspnea  Abdominal: Soft, NT, ND, bowel sounds + Extremities: no edema, no cyanosis Psych: Normal mood and affect, stable judgement and insight     The results of significant diagnostics from this hospitalization (including imaging, microbiology, ancillary and laboratory) are listed below for reference.     Microbiology: Recent Results (from the past 240 hour(s))  Urine Culture     Status: None   Collection Time: 03/11/22  7:55 PM   Specimen: Urine, Clean Catch  Result Value Ref Range Status  Specimen Description   Final    URINE, CLEAN CATCH Performed at [facility], [place] [address] [place], [place] [address]    Special Requests   Final    Immunocompromised Performed at [facility], [place] [address], [place], [place] [address]    Culture   Final    NO GROWTH Performed at [facility], [place] [address], [place], [place] [address]    Report Status 03/13/2022 FINAL  Final     Labs: BNP (last 3 results) No results for input(s): "BNP" in the last 8760 hours. Basic Metabolic Panel: Recent Labs  Lab 03/10/22 0627 03/11/22 0452 03/12/22 0548 03/13/22 0631  NA 139 137 136 137  K 4.8 4.8 4.9 5.0  CL 103 105 103 102  CO2 26 24 23 26   GLUCOSE 118* 128* 124* 122*  BUN 34* 30* 24* 27*  CREATININE 0.89 0.67 0.88 0.85   CALCIUM 9.7 9.3 9.1 9.5  MG 2.8* 2.7* 2.5* 2.7*  PHOS 3.5 3.3 3.1 3.8   Liver Function Tests: Recent Labs  Lab 03/10/22 0627 03/11/22 0452 03/12/22 0548 03/13/22 0631  AST 17 15 12* 15  ALT 13 13 13 15   ALKPHOS 145* 118 105 117  BILITOT 0.6 0.7 0.6 0.8  PROT 8.2* 7.3 6.8 7.9  ALBUMIN 3.9 3.5 3.2* 3.6   No results for input(s): "LIPASE", "AMYLASE" in the last 168 hours. No results for input(s): "AMMONIA" in the last 168 hours. CBC: Recent Labs  Lab 03/10/22 0627 03/11/22 0452 03/12/22 0548 03/13/22 0631  WBC 20.9* 16.6* 17.0* 17.7*  NEUTROABS 17.7* 14.1* 14.2* 14.4*  HGB 12.8 12.1 12.0 13.9  HCT 40.9 38.8 37.8 44.8  MCV 92.3 93.3 92.4 93.3  PLT 379 375 389 428*   Cardiac Enzymes: No results for input(s): "CKTOTAL", "CKMB", "CKMBINDEX", "TROPONINI" in the last 168 hours. BNP: Invalid input(s): "POCBNP" CBG: No results for input(s): "GLUCAP" in the last 168 hours. D-Dimer No results for input(s): "DDIMER" in the last 72 hours. Hgb A1c No results for input(s): "HGBA1C" in the last 72 hours. Lipid Profile No results for input(s): "CHOL", "HDL", "LDLCALC", "TRIG", "CHOLHDL", "LDLDIRECT" in the last 72 hours. Thyroid function studies No results for input(s): "TSH", "T4TOTAL", "T3FREE", "THYROIDAB" in the last 72 hours.  Invalid input(s): "FREET3" Anemia work up No results for input(s): "VITAMINB12", "FOLATE", "FERRITIN", "TIBC", "IRON", "RETICCTPCT" in the last 72 hours. Urinalysis    Component Value Date/Time   COLORURINE STRAW (A) 03/07/2022 1524   APPEARANCEUR CLEAR 03/07/2022 1524   LABSPEC 1.004 (L) 03/07/2022 1524   PHURINE 5.0 03/07/2022 1524   GLUCOSEU NEGATIVE 03/07/2022 1524   HGBUR SMALL (A) 03/07/2022 1524   BILIRUBINUR NEGATIVE 03/07/2022 1524   KETONESUR NEGATIVE 03/07/2022 1524   PROTEINUR NEGATIVE 03/07/2022 1524   UROBILINOGEN 0.2 06/14/2010 1748   NITRITE NEGATIVE 03/07/2022 1524   LEUKOCYTESUR TRACE (A) 03/07/2022 1524   Sepsis  Labs Recent Labs  Lab 03/10/22 0627 03/11/22 0452 03/12/22 0548 03/13/22 0631  WBC 20.9* 16.6* 17.0* 17.7*   Microbiology Recent Results (from the past 240 hour(s))  Urine Culture     Status: None   Collection Time: 03/11/22  7:55 PM   Specimen: Urine, Clean Catch  Result Value Ref Range Status   Specimen Description   Final    URINE, CLEAN CATCH Performed at [facility], [place] [address], [place], [place] [address]    Special Requests   Final    Immunocompromised Performed at [facility], [place] [address], [place], [place] [address]    Culture   Final    NO GROWTH Performed at [facility], [place] [address], [place], [place] [address]    Report Status 03/13/2022 FINAL  Final     Patient was seen and examined on the day of discharge and was found to be in stable condition. Time coordinating discharge: 25 minutes including assessment and coordination of care, as well as examination of the patient.   SIGNED:  Dessa Phi, DO [facility] 03/16/2022, 11:10 AM

## 2022-03-16 NOTE — Progress Notes (Signed)
Reviewed discharge paperwork. Went over medication regimen and changes, upcoming appointments, & advised of where to pick up prescription. Patient escorted by RN to entrance and discharged with friend.

## 2022-03-18 ENCOUNTER — Encounter: Payer: Self-pay | Admitting: Radiation Oncology

## 2022-03-18 ENCOUNTER — Other Ambulatory Visit: Payer: Self-pay

## 2022-03-18 ENCOUNTER — Ambulatory Visit (INDEPENDENT_AMBULATORY_CARE_PROVIDER_SITE_OTHER): Payer: Medicare HMO | Admitting: Adult Health

## 2022-03-18 ENCOUNTER — Encounter: Payer: Self-pay | Admitting: Adult Health

## 2022-03-18 ENCOUNTER — Ambulatory Visit
Admission: RE | Admit: 2022-03-18 | Discharge: 2022-03-18 | Disposition: A | Discharge status: Home / Self Care | Payer: Medicare HMO | Source: Ambulatory Visit | Attending: Radiation Oncology | Admitting: Radiation Oncology

## 2022-03-18 ENCOUNTER — Telehealth: Payer: Self-pay | Admitting: *Deleted

## 2022-03-18 VITALS — BP 100/60 | HR 98 | Temp 98.2°F | Ht 66.5 in | Wt 176.2 lb

## 2022-03-18 DIAGNOSIS — Z87891 Personal history of nicotine dependence: Secondary | ICD-10-CM | POA: Diagnosis not present

## 2022-03-18 DIAGNOSIS — Z510 Encounter for antineoplastic radiation therapy: Secondary | ICD-10-CM | POA: Diagnosis not present

## 2022-03-18 DIAGNOSIS — C7951 Secondary malignant neoplasm of bone: Secondary | ICD-10-CM | POA: Diagnosis not present

## 2022-03-18 DIAGNOSIS — C3411 Malignant neoplasm of upper lobe, right bronchus or lung: Secondary | ICD-10-CM | POA: Diagnosis not present

## 2022-03-18 DIAGNOSIS — C3491 Malignant neoplasm of unspecified part of right bronchus or lung: Secondary | ICD-10-CM

## 2022-03-18 LAB — RAD ONC ARIA SESSION SUMMARY
Course Elapsed Days: 5
Plan Fractions Treated to Date: 4
Plan Prescribed Dose Per Fraction: 3 Gy
Plan Total Fractions Prescribed: 10
Plan Total Prescribed Dose: 30 Gy
Reference Point Dosage Given to Date: 12 Gy
Reference Point Session Dosage Given: 3 Gy
Session Number: 4

## 2022-03-18 NOTE — Patient Instructions (Addendum)
Continue on pain regimen as directed  Use caution with sedating medications  Continue with Radiation treatments and follow up with Radiation Oncology  Need follow up with Dr. Julien Nordmann  Follow up with Dr. Valeta Harms or Eavan Gonterman NP in 4-6 months and As needed   Please contact office for sooner follow up if symptoms do not improve or worsen or seek emergency care

## 2022-03-18 NOTE — Telephone Encounter (Signed)
Good afternoon, Ms. McAdoo-Bey was seen in our office today by Rexene Edison NP at [facility].  She would like for her to have a f/u with Dr. Ewing Schlein before May of 2024.  Could you please arrange this for [place]?  Thank you.

## 2022-03-18 NOTE — Assessment & Plan Note (Signed)
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  Patient does appear to be more comfortable.  Is undergoing SBRT.  Continue follow-up with radiation oncology.  Patient is followed by oncology-we will set patient up for posthospital follow-up with medical oncology Dr. Julien Nordmann.  Plan  Patient Instructions  Continue on pain regimen as directed  Use caution with sedating medications  Continue with Radiation treatments and follow up with Radiation Oncology  Need follow up with Dr. Julien Nordmann  Follow up with Dr. Valeta Harms or Chloeann Alfred NP in 4-6 months and As needed   Please contact office for sooner follow up if symptoms do not improve or worsen or seek emergency care

## 2022-03-18 NOTE — Progress Notes (Signed)
@Patient  ID: Brandi Holmes, female    DOB: [birth year], 71 y.o.   MRN: [id number]  Chief Complaint  Patient presents with   Hospitalization Follow-up    Referring provider: Lin Landsman, MD  HPI: 71 year old female former smoker followed for metastatic lung cancer Underwent navigational bronchoscopy 2021 with tissue diagnosis of upper lobe lung mass that was positive for adenocarcinoma.  Seen by thoracic surgeon in [facility]  Underwent right upper lobectomy diagnosed with T11B, N1MO malignancy with +3 nodes) declined adjunctive chemotherapy [employer]   TEST/EVENTS :  CT chest Aug 13, 2021 status post right upper lobectomy no evidence of recurrent or metastatic disease unchanged pretracheal lymph nodes, mild emphysema   03/18/2022 Follow up : Metastatic lung cancer, post hospital follow-up Patient returns for a posthospital follow-up.  As above patient was previously seen for right upper lobe lung mass.  She underwent navigational bronchoscopy with tissue diagnosis for adenocarcinoma.  Positive lymph nodes.  She had a right upper lobectomy with lymph node dissection on September 30, 2019 in [place].  She declined adjunctive systemic chemotherapy.  Patient was seen 1 month ago with significant pain in her ribs and back.  Previous CT abdomen and pelvis October 04, 2021 showed a right lower renal mass measuring 2.4 cm slightly increased and sclerotic lesions in L5 suspicious for metastatic disease.  MRI abdomen and pelvis showed enhancing bone lesions along right T9 and L5 suspicious for bone metastasis.  Patient was set up for a PET scan.  Unfortunately patient was hospitalized in the interim.  She presented to the emergency room with severe excruciating pain.  A CT chest was negative for PE.  Showed T9 mets with some soft tissue extension into the central spinal canal causing central spinal canal stenosis. She was seen by neurosurgery and felt no role for decompression at that time.   Radiation oncology consulted.  He is set patient up for radiation treatments.  Pain was managed with pain medications and gabapentin.  Patient underwent bone biopsy on March 11, 2022 that was positive for metastatic adenocarcinoma Immunostains show that the tumor cells are positive for CK7 and TTF-1  while they are negative for CK20 and PAX8  Since discharge patient is feeling better.  Her pain is more manageable.  She has started SBRT.  MRI brain showed no evidence of metastasis. Patient says appetite is okay.  No nausea vomiting.  No hemoptysis. Patient has not been set up for oncology since discharge.  She says she open to chemotherapy or immunotherapy discussion if an option .      Allergies  Allergen Reactions   Crab [Shellfish Allergy] Swelling    Swelling of feet   Morphine And Related Other (See Comments)    Nightmares     Immunization History  Administered Date(s) Administered   Moderna Sars-Covid-2 Vaccination 07/15/2019, 08/09/2019    Past Medical History:  Diagnosis Date   Anemia    as a child only   Coronary artery calcification of native artery    Hyperlipidemia    Hypertension    Liver spots    " per pt"   Lung nodule    Wears glasses    Wears partial dentures     Tobacco History: Social History   Tobacco Use  Smoking Status Former   Packs/day: 0.30   Years: 50.00   Total pack years: 15.00   Types: Cigarettes   Quit date: 2016   Years since quitting: 7.9  Smokeless Tobacco  Never   Counseling given: Not Answered   Outpatient Medications Prior to Visit  Medication Sig Dispense Refill   aspirin EC 81 MG tablet Take 81 mg by mouth at bedtime. Swallow whole.     calcium carbonate (OSCAL) 1500 (600 Ca) MG TABS tablet Take 600 mg of elemental calcium by mouth daily. When able to remember     clobetasol ointment (TEMOVATE) 2.99 % Apply 1 application  topically as needed (rash).     gabapentin (NEURONTIN) 600 MG tablet Take 600 mg by mouth 3 (three)  times daily.     lisinopril (ZESTRIL) 20 MG tablet Take 20 mg by mouth in the morning.     MAGNESIUM PO Take 1 tablet by mouth daily. When able to remember     nystatin-triamcinolone ointment (MYCOLOG) Apply 1 application  topically daily.     oxybutynin (DITROPAN) 5 MG tablet Take 5 mg by mouth as needed for bladder spasms.     tiZANidine (ZANAFLEX) 2 MG tablet Take 2 mg by mouth at bedtime.     traMADol (ULTRAM) 50 MG tablet Take 50 mg by mouth every 6 (six) hours as needed for severe pain. Every 8 hours     amLODipine (NORVASC) 5 MG tablet Take 1 tablet (5 mg total) by mouth every evening. (Patient taking differently: Take 5 mg by mouth at bedtime.) 90 tablet 0   atorvastatin (LIPITOR) 20 MG tablet Take 1 tablet (20 mg total) by mouth at bedtime. 90 tablet 0   dexamethasone (DECADRON) 4 MG tablet Take 1 tablet (4 mg total) by mouth 3 (three) times daily. (Patient not taking: Reported on 03/18/2022) 90 tablet 1   pregabalin (LYRICA) 50 MG capsule Take 50 mg by mouth 2 (two) times daily. (Patient not taking: Reported on 03/18/2022)     No facility-administered medications prior to visit.     Review of Systems:   Constitutional:   No  weight loss, night sweats,  Fevers, chills, + fatigue, or  lassitude.  HEENT:   No headaches,  Difficulty swallowing,  Tooth/dental problems, or  Sore throat,                No sneezing, itching, ear ache, nasal congestion, post nasal drip,   CV:  No chest pain,  Orthopnea, PND, swelling in lower extremities, anasarca, dizziness, palpitations, syncope.   GI  No heartburn, indigestion, abdominal pain, nausea, vomiting, diarrhea, change in bowel habits, loss of appetite, bloody stools.   Resp: No shortness of breath with exertion or at rest.  No excess mucus, no productive cough,  No non-productive cough,  No coughing up of blood.  No change in color of mucus.  No wheezing.  No chest wall deformity  Skin: no rash or lesions.  GU: no dysuria, change in color  of urine, no urgency or frequency.  No flank pain, no hematuria   MS: Back and rib pain   Physical Exam  BP 100/60 (BP Location: Left Arm, Patient Position: Sitting, Cuff Size: Normal)   Pulse 98   Temp 98.2 F (36.8 C) (Oral)   Ht 5' 6.5" (1.689 m)   Wt 176 lb 3.2 oz (79.9 kg)   SpO2 95%   BMI 28.01 kg/m   GEN: A/Ox3; pleasant , NAD, well nourished    HEENT:  [place]/AT,  EACs-clear, TMs-wnl, NOSE-clear, THROAT-clear, no lesions, no postnasal drip or exudate noted.   NECK:  Supple w/ fair ROM; no JVD; normal carotid impulses w/o bruits; no thyromegaly or nodules  palpated; no lymphadenopathy.    RESP  Clear  P & A; w/o, wheezes/ rales/ or rhonchi. no accessory muscle use, no dullness to percussion  CARD:  RRR, no m/r/g, no peripheral edema, pulses intact, no cyanosis or clubbing.  GI:   Soft & nt; nml bowel sounds; no organomegaly or masses detected.   Musco: Warm bil, no deformities or joint swelling noted.   Neuro: alert, no focal deficits noted.    Skin: Warm, no lesions or rashes    Lab Results:  CBC    Component Value Date/Time   WBC 17.7 (H) 03/13/2022 0631   RBC 4.80 03/13/2022 0631   HGB 13.9 03/13/2022 0631   HGB 13.4 02/14/2021 1528   HCT 44.8 03/13/2022 0631   PLT 428 (H) 03/13/2022 0631   PLT 310 02/14/2021 1528   MCV 93.3 03/13/2022 0631   MCH 29.0 03/13/2022 0631   MCHC 31.0 03/13/2022 0631   RDW 12.7 03/13/2022 0631   LYMPHSABS 2.0 03/13/2022 0631   MONOABS 1.0 03/13/2022 0631   EOSABS 0.0 03/13/2022 0631   BASOSABS 0.0 03/13/2022 0631    BMET    Component Value Date/Time   NA 137 03/13/2022 0631   NA 142 07/22/2019 1522   K 5.0 03/13/2022 0631   CL 102 03/13/2022 0631   CO2 26 03/13/2022 0631   GLUCOSE 122 (H) 03/13/2022 0631   BUN 27 (H) 03/13/2022 0631   BUN 13 07/22/2019 1522   CREATININE 0.85 03/13/2022 0631   CREATININE 0.96 02/14/2021 1528   CREATININE 0.91 09/16/2011 1000   CALCIUM 9.5 03/13/2022 0631   GFRNONAA >60  03/13/2022 0631   GFRNONAA >60 02/14/2021 1528   GFRAA >60 08/13/2019 0605    BNP No results found for: "BNP"  ProBNP No results found for: "PROBNP"  Imaging: CT BONE TROCAR/NEEDLE BIOPSY DEEP  Result Date: 03/11/2022 CLINICAL DATA:  Metastatic disease of the spine involving thoracic and lumbar vertebral bodies with history of lung carcinoma. The most significant vertebral lesion is at the T9 level and also involving posterior elements of T9 including the right-sided transverse process and costovertebral junction. EXAM: CT GUIDED CORE BIOPSY OF BODY PART ANESTHESIA/SEDATION: Moderate (conscious) sedation was employed during this procedure. A total of Versed 2.0 mg and Fentanyl 100 mcg was administered intravenously by radiology nursing. Moderate Sedation Time: 17 minutes. The patient's level of consciousness and vital signs were monitored continuously by radiology nursing throughout the procedure under my direct supervision. PROCEDURE: The procedure risks, benefits, and alternatives were explained to the patient. Questions regarding the procedure were encouraged and answered. The patient understands and consents to the procedure. A time-out was performed prior to initiating the procedure. CT was performed through the mid to lower thoracic region in a prone position. The posterior thoracic region was prepped with chlorhexidine in a sterile fashion, and a sterile drape was applied covering the operative field. A sterile gown and sterile gloves were used for the procedure. Local anesthesia was provided with 1% Lidocaine. Under CT guidance, a 17 gauge trocar needle was advanced to the level of a lesion involving the right-sided transverse process and costovertebral junction of the T9 vertebral body. After confirming needle tip position, 3 separate core biopsy samples were obtained with an 18 gauge biopsy device. Samples were submitted in formalin. Additional CT was performed after outer needle removal.  RADIATION DOSE REDUCTION: This exam was performed according to the departmental dose-optimization program which includes automated exposure control, adjustment of the mA and/or kV according to  patient size and/or use of iterative reconstruction technique. COMPLICATIONS: None FINDINGS: Tumor involving the T9 vertebral body at the level of right-sided posterior elements was targeted. Fragments of solid tissue were obtained. IMPRESSION: CT-guided core biopsy performed of tumor involving the T9 vertebral body at the level of the right-sided transverse process and right costovertebral junction. Electronically Signed   By: Aletta Edouard M.D.   On: 03/11/2022 14:34   MR BRAIN W WO CONTRAST  Result Date: 03/08/2022 CLINICAL DATA:  Metastatic disease evaluation. EXAM: MRI HEAD WITHOUT AND WITH CONTRAST TECHNIQUE: Multiplanar, multiecho pulse sequences of the brain and surrounding structures were obtained without and with intravenous contrast. CONTRAST:  31mL GADAVIST GADOBUTROL 1 MMOL/ML IV SOLN COMPARISON:  Head CT 02/16/2005 FINDINGS: Brain: There is no evidence of an acute infarct, intracranial hemorrhage, mass, midline shift, or extra-axial fluid collection. There is mild cerebral atrophy. Small T2 hyperintensities in the cerebral white matter bilaterally are nonspecific but compatible with minimal chronic small vessel ischemic disease. No abnormal enhancement is identified. Vascular: Major intracranial vascular flow voids are preserved. Skull and upper cervical spine: 1.3 cm sclerotic focus in the right frontal calvarium, not felt to reflect active metastatic disease as this was also present in 2006 and is without appreciable enhancement. Disc degeneration and asymmetric left-sided facet arthrosis at C3-4 and C4-5. Sinuses/Orbits: Unremarkable orbits. Paranasal sinuses and mastoid air cells are clear. Other: None. IMPRESSION: No evidence of intracranial metastases. Electronically Signed   By: Logan Bores M.D.    On: 03/08/2022 14:30   MR THORACIC SPINE W WO CONTRAST  Result Date: 03/07/2022 CLINICAL DATA:  Metastatic disease evaluation EXAM: MRI THORACIC AND LUMBAR SPINE WITHOUT AND WITH CONTRAST TECHNIQUE: Multiplanar and multiecho pulse sequences of the thoracic and lumbar spine were obtained without and with intravenous contrast. CONTRAST:  33mL GADAVIST GADOBUTROL 1 MMOL/ML IV SOLN COMPARISON:  No prior MRI, correlation is made with same day CT chest abdomen pelvis. FINDINGS: MRI THORACIC SPINE FINDINGS Alignment: S shaped curvature of the thoracolumbar spine. 3 mm anterolisthesis of C7 on T1. No significant listhesis in the thoracic spine. Vertebrae: Abnormal marrow signal in the T1 vertebral body, extending into the pedicles; in the spinous process and left pedicle of T8 (series 17, images 8 and 11); in the posterior aspect of T9 and in the posterior elements, described in more detail below; at the superior aspect of T10 and in the spinous process (series 17, images 10 and 11). The majority of the T9 vertebral body is occupied by an expansile lesion, which extends into the right greater than left posterior elements and proximal right ninth and tenth ribs (series 30, image 32), as well as likely the right eighth rib. This circumferentially narrows the spinal canal, causing severe spinal canal stenosis posterior to the T9 vertebral body, as well as moderate right neural foraminal narrowing at T8-T9 and severe right neural foraminal narrowing at T9-T10. While the majority of the aforementioned lesions do not demonstrate significant enhancement, the T9 lesion and its components in the right ninth and tenth ribs do enhance. Cord: Deformation of the spinal cord posterior to the T9 vertebral body. No abnormal spinal cord signal. Otherwise normal morphology. Paraspinal and other soft tissues: No acute finding. Hepatic cysts, which are better evaluate CT. Disc levels: C6-C7: Moderate spinal canal stenosis. C7-T1:  Mild-to-moderate spinal canal stenosis. T8-T9: No spinal canal stenosis. Moderate right neural foraminal narrowing. T9: Severe spinal canal stenosis. T9-T10: Mild to moderate spinal canal stenosis. Severe right neural foraminal narrowing. MRI  LUMBAR SPINE FINDINGS Segmentation:  5 lumbar type vertebral bodies. Alignment: S shaped curvature of the thoracolumbar spine. Trace anterolisthesis of L3 on L4. Vertebrae: Abnormal marrow signal in the left posterior aspect of L3, involving the anterior aspect of the pedicle (series 23, image 11). Abnormal signal involving the right greater than left posterior elements of L4 (series 23, images 5-12). Abnormal signal involving the majority of the L5 vertebral body. The pedicles do not appear to be significantly involved, although there is some abnormal signal in the right inferior to killer facet (series 23, image 7). Conus medullaris: Extends to the L2 level and appears normal. No definite abnormal enhancement. Several nerve roots at the level of L5-S1 demonstrate mildly increased T1 signal (series 27, image 31). Paraspinal and other soft tissues: Indeterminate right renal mass, which is better evaluated on the same day CT abdomen pelvis. Disc levels: T12-L1: No significant disc bulge. No spinal canal stenosis or neural foraminal narrowing. L1-L2: No significant disc bulge. Mild facet arthropathy. No spinal canal stenosis. Mild right neural foraminal narrowing. L2-L3: Mild disc bulge. Mild facet arthropathy. Ligamentum flavum hypertrophy. Borderline spinal canal stenosis. Mild right neural foraminal narrowing. L3-L4: Trace anterolisthesis with mild disc bulge and disc unroofing. Mild-to-moderate facet arthropathy. Ligamentum flavum hypertrophy. Narrowing of the lateral recesses. No spinal canal stenosis or neural foraminal narrowing. L4-L5: Mild disc bulge. Mild facet arthropathy. Ligamentum flavum hypertrophy. Narrowing of the lateral recesses. No spinal canal stenosis. No  neural foraminal narrowing. Disc height loss with right eccentric disc osteophyte complex. No spinal canal stenosis. No definite neural foraminal narrowing. L5-S1: No significant disc bulge. No spinal canal stenosis or neural foraminal narrowing. IMPRESSION: 1. 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 This causes severe spinal canal stenosis, with deformation of the spinal cord but no abnormal cord signal, as well as mild to moderate spinal canal stenosis at T9-T10, moderate neural foraminal narrowing T8-T9, and severe right neural foraminal narrowing at T9-T10. 2. Additional metastatic disease is seen at T1, T8, T10, L3, L4, and L5, as described above. 3. C6-C7 moderate spinal canal stenosis. 4. C7-T1 mild-to-moderate spinal canal stenosis. 5. L3-L4 and L4-L5 narrowing of the lateral recesses, which could affect the descending L4 and L5 nerve roots, respectively. 6. L1-L2 and L2-L3 mild right neural foraminal narrowing. 7. Indeterminate right renal mass, which is better evaluated on the same day CT abdomen pelvis. Electronically Signed   By: Merilyn Baba M.D.   On: 03/07/2022 00:03   MR Lumbar Spine W Wo Contrast  Result Date: 03/07/2022 CLINICAL DATA:  Metastatic disease evaluation EXAM: MRI THORACIC AND LUMBAR SPINE WITHOUT AND WITH CONTRAST TECHNIQUE: Multiplanar and multiecho pulse sequences of the thoracic and lumbar spine were obtained without and with intravenous contrast. CONTRAST:  23mL GADAVIST GADOBUTROL 1 MMOL/ML IV SOLN COMPARISON:  No prior MRI, correlation is made with same day CT chest abdomen pelvis. FINDINGS: MRI THORACIC SPINE FINDINGS Alignment: S shaped curvature of the thoracolumbar spine. 3 mm anterolisthesis of C7 on T1. No significant listhesis in the thoracic  spine. Vertebrae: Abnormal marrow signal in the T1 vertebral body, extending into the pedicles; in the spinous process and left pedicle of T8 (series 17, images 8 and 11); in the posterior aspect of T9 and in the posterior elements, described in more detail below; at  the superior aspect of T10 and in the spinous process (series 17, images 10 and 11). The majority of the T9 vertebral body is occupied by an expansile lesion, which extends into the right greater than left posterior elements and proximal right ninth and tenth ribs (series 30, image 32), as well as likely the right eighth rib. This circumferentially narrows the spinal canal, causing severe spinal canal stenosis posterior to the T9 vertebral body, as well as moderate right neural foraminal narrowing at T8-T9 and severe right neural foraminal narrowing at T9-T10. While the majority of the aforementioned lesions do not demonstrate significant enhancement, the T9 lesion and its components in the right ninth and tenth ribs do enhance. Cord: Deformation of the spinal cord posterior to the T9 vertebral body. No abnormal spinal cord signal. Otherwise normal morphology. Paraspinal and other soft tissues: No acute finding. Hepatic cysts, which are better evaluate CT. Disc levels: C6-C7: Moderate spinal canal stenosis. C7-T1: Mild-to-moderate spinal canal stenosis. T8-T9: No spinal canal stenosis. Moderate right neural foraminal narrowing. T9: Severe spinal canal stenosis. T9-T10: Mild to moderate spinal canal stenosis. Severe right neural foraminal narrowing. MRI LUMBAR SPINE FINDINGS Segmentation:  5 lumbar type vertebral bodies. Alignment: S shaped curvature of the thoracolumbar spine. Trace anterolisthesis of L3 on L4. Vertebrae: Abnormal marrow signal in the left posterior aspect of L3, involving the anterior aspect of the pedicle (series 23, image 11). Abnormal signal involving the right greater than left posterior elements of L4 (series 23, images 5-12).  Abnormal signal involving the majority of the L5 vertebral body. The pedicles do not appear to be significantly involved, although there is some abnormal signal in the right inferior to killer facet (series 23, image 7). Conus medullaris: Extends to the L2 level and appears normal. No definite abnormal enhancement. Several nerve roots at the level of L5-S1 demonstrate mildly increased T1 signal (series 27, image 31). Paraspinal and other soft tissues: Indeterminate right renal mass, which is better evaluated on the same day CT abdomen pelvis. Disc levels: T12-L1: No significant disc bulge. No spinal canal stenosis or neural foraminal narrowing. L1-L2: No significant disc bulge. Mild facet arthropathy. No spinal canal stenosis. Mild right neural foraminal narrowing. L2-L3: Mild disc bulge. Mild facet arthropathy. Ligamentum flavum hypertrophy. Borderline spinal canal stenosis. Mild right neural foraminal narrowing. L3-L4: Trace anterolisthesis with mild disc bulge and disc unroofing. Mild-to-moderate facet arthropathy. Ligamentum flavum hypertrophy. Narrowing of the lateral recesses. No spinal canal stenosis or neural foraminal narrowing. L4-L5: Mild disc bulge. Mild facet arthropathy. Ligamentum flavum hypertrophy. Narrowing of the lateral recesses. No spinal canal stenosis. No neural foraminal narrowing. Disc height loss with right eccentric disc osteophyte complex. No spinal canal stenosis. No definite neural foraminal narrowing. L5-S1: No significant disc bulge. No spinal canal stenosis or neural foraminal narrowing. IMPRESSION: 1. 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 This causes severe spinal canal stenosis, with deformation of the spinal cord but no abnormal cord signal, as well as  mild to moderate spinal canal stenosis at T9-T10, moderate neural foraminal narrowing T8-T9, and severe right neural foraminal narrowing at T9-T10. 2. Additional metastatic disease is seen at T1, T8, T10, L3, L4, and L5, as described above. 3. C6-C7 moderate spinal canal stenosis. 4. C7-T1 mild-to-moderate spinal canal stenosis. 5. L3-L4 and L4-L5 narrowing of the  lateral recesses, which could affect the descending L4 and L5 nerve roots, respectively. 6. L1-L2 and L2-L3 mild right neural foraminal narrowing. 7. Indeterminate right renal mass, which is better evaluated on the same day CT abdomen pelvis. Electronically Signed   By: Merilyn Baba M.D.   On: 03/07/2022 00:03   CT Angio Chest Pulmonary Embolism (PE) W or WO Contrast  Result Date: 03/06/2022 CLINICAL DATA:  Shortness of breath with chest pain. Acute abdominal pain. History of non-small cell lung cancer status post right lobectomy. EXAM: CT ANGIOGRAPHY CHEST CT ABDOMEN AND PELVIS WITH CONTRAST TECHNIQUE: Multidetector CT imaging of the chest was performed using the standard protocol during bolus administration of intravenous contrast. Multiplanar CT image reconstructions and MIPs were obtained to evaluate the vascular anatomy. Multidetector CT imaging of the abdomen and pelvis was performed using the standard protocol during bolus administration of intravenous contrast. RADIATION DOSE REDUCTION: This exam was performed according to the departmental dose-optimization program which includes automated exposure control, adjustment of the mA and/or kV according to patient size and/or use of iterative reconstruction technique. CONTRAST:  11mL OMNIPAQUE IOHEXOL 350 MG/ML SOLN COMPARISON:  MRI abdomen 12/02/2021. CT chest 08/13/2021. chest CT 02/24/2020. FINDINGS: CTA CHEST FINDINGS Cardiovascular: Satisfactory opacification of the pulmonary arteries to the segmental level. No evidence of pulmonary embolism. Normal heart size. No pericardial effusion.  Mediastinum/Nodes: No enlarged mediastinal, hilar, or axillary lymph nodes. Thyroid gland, trachea, and esophagus demonstrate no significant findings. Lungs/Pleura: Right upper lobectomy changes are again seen. There is stable scarring and bronchiectasis in the right upper lobe. There is some scattered peripheral reticular opacities which are nonspecific throughout both lower lungs. There is no focal lung infiltrate, pleural effusion or pneumothorax. There is a 3 mm nodule in the left upper lobe image 5/1 which is unchanged from most recent prior. This is new compared to 2021. No new pulmonary nodules are seen. No pleural effusion or pneumothorax. Musculoskeletal: There is new metastatic disease in the posterior elements T9, right greater than left, extending into the posterior right ninth rib. There is soft tissue extension into the central spinal canal causing central spinal canal stenosis. There are healed posterior right rib fractures. Metastatic osseous lesion in the T1 vertebral body has increased in size now measuring 14 mm (previously 8 mm). Review of the MIP images confirms the above findings. CT ABDOMEN and PELVIS FINDINGS Hepatobiliary: Scattered rounded hypodense lesions in the liver are unchanged and favored as small cysts. No new liver lesions. Gallbladder and bile ducts are within normal limits. Pancreas: Unremarkable. No pancreatic ductal dilatation or surrounding inflammatory changes. Spleen: Normal in size without focal abnormality. Adrenals/Urinary Tract: Rounded right renal mass measuring 2.2 cm is unchanged. Otherwise, adrenal glands, kidneys and bladder are within normal limits. Stomach/Bowel: Stomach is within normal limits. Appendix appears normal. No evidence of bowel wall thickening, distention, or inflammatory changes. There is a large amount of stool throughout the colon. Vascular/Lymphatic: Aortic atherosclerosis. No enlarged abdominal or pelvic lymph nodes. Reproductive: Status post  hysterectomy. No adnexal masses. Other: No ascites or free air. Musculoskeletal: Sclerotic osseous metastatic disease has increased in the spine, most significantly at the L5 level measuring up to 4.5 cm (previously 3.1 cm). No acute fractures are seen. Review of the MIP images confirms the above findings. IMPRESSION: 1. No evidence for pulmonary embolism. 2. Stable right upper lobectomy changes. 3. Stable 3 mm left upper lobe pulmonary nodule. 4. New metastatic disease in the posterior elements of T9 with soft tissue extension into  the central spinal canal causing central spinal canal stenosis. Correlate for signs of cord compression. This can be further evaluated with MRI. 5. Worsening osseous metastatic disease. 6. No acute localizing process in the abdomen or pelvis. 7. Stable right renal mass worrisome for renal cell carcinoma. Aortic Atherosclerosis (ICD10-I70.0). Electronically Signed   By: Ronney Asters M.D.   On: 03/06/2022 20:14   CT ABDOMEN PELVIS W CONTRAST  Result Date: 03/06/2022 CLINICAL DATA:  Shortness of breath with chest pain. Acute abdominal pain. History of non-small cell lung cancer status post right lobectomy. EXAM: CT ANGIOGRAPHY CHEST CT ABDOMEN AND PELVIS WITH CONTRAST TECHNIQUE: Multidetector CT imaging of the chest was performed using the standard protocol during bolus administration of intravenous contrast. Multiplanar CT image reconstructions and MIPs were obtained to evaluate the vascular anatomy. Multidetector CT imaging of the abdomen and pelvis was performed using the standard protocol during bolus administration of intravenous contrast. RADIATION DOSE REDUCTION: This exam was performed according to the departmental dose-optimization program which includes automated exposure control, adjustment of the mA and/or kV according to patient size and/or use of iterative reconstruction technique. CONTRAST:  120mL OMNIPAQUE IOHEXOL 350 MG/ML SOLN COMPARISON:  MRI abdomen 12/02/2021. CT  chest 08/13/2021. chest CT 02/24/2020. FINDINGS: CTA CHEST FINDINGS Cardiovascular: Satisfactory opacification of the pulmonary arteries to the segmental level. No evidence of pulmonary embolism. Normal heart size. No pericardial effusion. Mediastinum/Nodes: No enlarged mediastinal, hilar, or axillary lymph nodes. Thyroid gland, trachea, and esophagus demonstrate no significant findings. Lungs/Pleura: Right upper lobectomy changes are again seen. There is stable scarring and bronchiectasis in the right upper lobe. There is some scattered peripheral reticular opacities which are nonspecific throughout both lower lungs. There is no focal lung infiltrate, pleural effusion or pneumothorax. There is a 3 mm nodule in the left upper lobe image 5/1 which is unchanged from most recent prior. This is new compared to 2021. No new pulmonary nodules are seen. No pleural effusion or pneumothorax. Musculoskeletal: There is new metastatic disease in the posterior elements T9, right greater than left, extending into the posterior right ninth rib. There is soft tissue extension into the central spinal canal causing central spinal canal stenosis. There are healed posterior right rib fractures. Metastatic osseous lesion in the T1 vertebral body has increased in size now measuring 14 mm (previously 8 mm). Review of the MIP images confirms the above findings. CT ABDOMEN and PELVIS FINDINGS Hepatobiliary: Scattered rounded hypodense lesions in the liver are unchanged and favored as small cysts. No new liver lesions. Gallbladder and bile ducts are within normal limits. Pancreas: Unremarkable. No pancreatic ductal dilatation or surrounding inflammatory changes. Spleen: Normal in size without focal abnormality. Adrenals/Urinary Tract: Rounded right renal mass measuring 2.2 cm is unchanged. Otherwise, adrenal glands, kidneys and bladder are within normal limits. Stomach/Bowel: Stomach is within normal limits. Appendix appears normal. No  evidence of bowel wall thickening, distention, or inflammatory changes. There is a large amount of stool throughout the colon. Vascular/Lymphatic: Aortic atherosclerosis. No enlarged abdominal or pelvic lymph nodes. Reproductive: Status post hysterectomy. No adnexal masses. Other: No ascites or free air. Musculoskeletal: Sclerotic osseous metastatic disease has increased in the spine, most significantly at the L5 level measuring up to 4.5 cm (previously 3.1 cm). No acute fractures are seen. Review of the MIP images confirms the above findings. IMPRESSION: 1. No evidence for pulmonary embolism. 2. Stable right upper lobectomy changes. 3. Stable 3 mm left upper lobe pulmonary nodule. 4. New metastatic disease in the posterior elements  of T9 with soft tissue extension into the central spinal canal causing central spinal canal stenosis. Correlate for signs of cord compression. This can be further evaluated with MRI. 5. Worsening osseous metastatic disease. 6. No acute localizing process in the abdomen or pelvis. 7. Stable right renal mass worrisome for renal cell carcinoma. Aortic Atherosclerosis (ICD10-I70.0). Electronically Signed   By: Ronney Asters M.D.   On: 03/06/2022 20:14   DG Chest 2 View  Result Date: 03/06/2022 CLINICAL DATA:  Chest pain. EXAM: CHEST - 2 VIEW COMPARISON:  06/15/2020 FINDINGS: The cardiac silhouette, mediastinal and hilar contours are within normal limits and stable. Stable tortuosity of the thoracic aorta. Chronic emphysematous changes and remote surgical changes involving the right lung. Probable radiation changes also. No acute pulmonary findings or worrisome pulmonary lesions. IMPRESSION: Chronic lung changes but no acute pulmonary findings. Electronically Signed   By: Marijo Sanes M.D.   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  Patient does appear to be more comfortable.  Is undergoing SBRT.  Continue follow-up with radiation oncology.  Patient is followed by oncology-we will set patient up for posthospital follow-up with medical oncology Dr. Julien Nordmann.  Plan  Patient Instructions  Continue on pain regimen as directed  Use caution with sedating medications  Continue with Radiation treatments and follow up with Radiation Oncology  Need follow up with Dr. Julien Nordmann  Follow up with Dr. Valeta Harms or Marleigh Kaylor NP in 4-6 months and As needed   Please contact office for sooner follow up if symptoms do not improve or worsen or seek emergency care        I spent   42 minutes dedicated to the care of this patient on the date of this encounter to include pre-visit review of records, face-to-face time with the patient discussing conditions above, post visit ordering of testing, clinical documentation with the electronic health record, making appropriate referrals as documented, and communicating necessary findings to members of the patients care team.   Rexene Edison, NP 03/18/2022

## 2022-03-18 NOTE — Progress Notes (Signed)
  Radiation Oncology         ([phone]) [phone] ________________________________  Name: Brandi Holmes  JZP:[id number]  Date of Service: 03/18/22  DOB: [birth year]   Steroid Taper Instructions   You currently have a prescription for Dexamethasone 4 mg Tablets.   Beginning 03/19/22  Take a 4 mg tablet twice a day  Beginning 03/26/22: Take 1/2 of a tablet (which is 2 mg) twice a day  Beginning 03/30/22: Take 1/2 of a tablet (which is 2 mg) once a day and stop on 04/03/22.   Please call our office if you have any headaches, visual changes, uncontrolled movements, extremity weakness, nausea or vomiting.

## 2022-03-19 ENCOUNTER — Other Ambulatory Visit: Payer: Self-pay

## 2022-03-19 ENCOUNTER — Ambulatory Visit
Admission: RE | Admit: 2022-03-19 | Discharge: 2022-03-19 | Disposition: A | Discharge status: Home / Self Care | Payer: Medicare HMO | Source: Ambulatory Visit | Attending: Radiation Oncology | Admitting: Radiation Oncology

## 2022-03-19 DIAGNOSIS — Z510 Encounter for antineoplastic radiation therapy: Secondary | ICD-10-CM | POA: Diagnosis not present

## 2022-03-19 LAB — RAD ONC ARIA SESSION SUMMARY
Course Elapsed Days: 6
Plan Fractions Treated to Date: 5
Plan Prescribed Dose Per Fraction: 3 Gy
Plan Total Fractions Prescribed: 10
Plan Total Prescribed Dose: 30 Gy
Reference Point Dosage Given to Date: 15 Gy
Reference Point Session Dosage Given: 3 Gy
Session Number: 5

## 2022-03-20 ENCOUNTER — Other Ambulatory Visit: Payer: Self-pay

## 2022-03-20 ENCOUNTER — Ambulatory Visit
Admission: RE | Admit: 2022-03-20 | Discharge: 2022-03-20 | Disposition: A | Discharge status: Home / Self Care | Payer: Medicare HMO | Source: Ambulatory Visit | Attending: Radiation Oncology | Admitting: Radiation Oncology

## 2022-03-20 DIAGNOSIS — Z510 Encounter for antineoplastic radiation therapy: Secondary | ICD-10-CM | POA: Diagnosis not present

## 2022-03-20 LAB — RAD ONC ARIA SESSION SUMMARY
Course Elapsed Days: 7
Plan Fractions Treated to Date: 6
Plan Prescribed Dose Per Fraction: 3 Gy
Plan Total Fractions Prescribed: 10
Plan Total Prescribed Dose: 30 Gy
Reference Point Dosage Given to Date: 18 Gy
Reference Point Session Dosage Given: 3 Gy
Session Number: 6

## 2022-03-20 NOTE — Progress Notes (Unsigned)
[facility] OFFICE PROGRESS NOTE  Lin Landsman, [place] [address]  DIAGNOSIS: Metastatic non-small lung cancer, adenocarcinoma.  She was initially diagnosed in June 2021 as a stage IIb (T1b, N1, M0) she had metastatic disease to the bones diagnosed in November 2023.   PRIOR THERAPY: Status post right upper lobectomy with lymph node dissection on September 30, 2019 in [place]. She declines adjuvant systemic chemotherapy.   CURRENT THERAPY: None    INTERVAL HISTORY: Brandi Holmes 71 y.o. female returns to the clinic for a follow-up visit.  The patient was last seen in the clinic by Dr. Julien Nordmann on 09/13/21.  The patient has a history of stage IIb non-small cell lung cancer adenocarcinoma. Sshe underwent a right upper lobectomy lymph node dissection in [place], [place]  She declined adjuvant systemic chemotherapy at that time.  In June 2023, the patient was endorsing pain in her ribs and back.  She had a CT abdomen pelvis that showed a right lower lobe renal mass measuring 2.4 cm slightly increased and a sclerotic lesion at L5 suspicious for metastatic disease.  She subsequently had an MRI of the abdomen and pelvis which showed enhancing bone lesions along T9 and L5 suspicious for bone metastases.  The patient was set up for PET scan.  Unfortunately, the patient was hospitalized in the interim.  She recently presented to the emergency room on 03/06/2022 with severe pain.  She has a T9 metastatic bone lesion with soft tissue extension into the central spinal canal causing central spinal canal stenosis.  She then had MRIs of the thoracic spine and lumbar spine which showed metastatic multifocal osseous disease at T1, T8, T9, T10, L3, L4, and L5.   She was evaluated by neurosurgery and no decompression was not recommended at this time.  The patient is being seen by Dr. Lisbeth Renshaw from radiation oncology for palliative radiation to this lesion. The last day of  radiation is expected on 12/12.  Her pain is being managed by her PCP.  She takes tramadol every 8 hours.  She states taking tramadol takes her pain level down to a 2 out of 10.  She also discusses that repositioning helps her pain.  While admitted to the hospital, she had a staging brain MRI which did not show any evidence of metastatic disease.   The patient had a CT-guided biopsy of the metastatic bone lesion and the final pathology was consistent with non-small cell lung cancer, adenocarcinoma.  Overall, besides the pain the patient is feeling fairly well today except she reports some soreness in her tongue and in her throat.  She states she has an appointment at 2:00 today with her PCP.  Denies any fever, chills, or night sweats.  She lost approximately 1 pound since last week due to the soreness in her throat.  Denies any changes in breathing and she states her breathing is "wonderful".  She denies any chest pain, shortness of breath, cough, or hemoptysis.  Denies any nausea, vomiting, diarrhea.  She sometimes has constipation for which she uses laxative Gummies.  Denies any headache or visual changes.  She is here today for evaluation and more detailed discussion about her current condition and recommended treatment options.   MEDICAL HISTORY: Past Medical History:  Diagnosis Date   Anemia    as a child only   Coronary artery calcification of native artery    Hyperlipidemia    Hypertension    Liver spots    "  per pt"   Lung nodule    Wears glasses    Wears partial dentures     ALLERGIES:  is allergic to crab [shellfish allergy] and morphine and related.  MEDICATIONS:  Current Outpatient Medications  Medication Sig Dispense Refill   amLODipine (NORVASC) 5 MG tablet Take 1 tablet (5 mg total) by mouth every evening. (Patient taking differently: Take 5 mg by mouth at bedtime.) 90 tablet 0   aspirin EC 81 MG tablet Take 81 mg by mouth at bedtime. Swallow whole.     atorvastatin  (LIPITOR) 20 MG tablet Take 1 tablet (20 mg total) by mouth at bedtime. 90 tablet 0   calcium carbonate (OSCAL) 1500 (600 Ca) MG TABS tablet Take 600 mg of elemental calcium by mouth daily. When able to remember     clobetasol ointment (TEMOVATE) 8.46 % Apply 1 application  topically as needed (rash).     dexamethasone (DECADRON) 4 MG tablet Take 1 tablet (4 mg total) by mouth 3 (three) times daily. 90 tablet 1   gabapentin (NEURONTIN) 600 MG tablet Take 600 mg by mouth 3 (three) times daily.     lisinopril (ZESTRIL) 20 MG tablet Take 20 mg by mouth in the morning.     MAGNESIUM PO Take 1 tablet by mouth daily. When able to remember     nystatin-triamcinolone ointment (MYCOLOG) Apply 1 application  topically daily.     oxybutynin (DITROPAN) 5 MG tablet Take 5 mg by mouth as needed for bladder spasms.     potassium chloride SA (KLOR-CON M) 20 MEQ tablet Take by mouth daily. Take 1 tablet a day with Calcium and Magnesium when she can remember for leg cramps     tiZANidine (ZANAFLEX) 2 MG tablet Take 2 mg by mouth at bedtime.     traMADol (ULTRAM) 50 MG tablet Take 50 mg by mouth every 6 (six) hours as needed for severe pain. Every 8 hours     No current facility-administered medications for this visit.    SURGICAL HISTORY:  Past Surgical History:  Procedure Laterality Date   ABDOMINAL HYSTERECTOMY  1974   heavy bleeding   ABDOMINAL HYSTERECTOMY     BRONCHIAL BRUSHINGS  08/13/2019   Procedure: BRONCHIAL BRUSHINGS;  Surgeon: Garner Nash, DO;  Location: [facility];  Service: Pulmonary;;   BRONCHIAL WASHINGS  08/13/2019   Procedure: BRONCHIAL WASHINGS;  Surgeon: Garner Nash, DO;  Location: [facility];  Service: Pulmonary;;   COLONOSCOPY W/ BIOPSIES AND POLYPECTOMY     DILATION AND CURETTAGE OF UTERUS     FINE NEEDLE ASPIRATION  08/13/2019   Procedure: FINE NEEDLE ASPIRATION (FNA) LINEAR;  Surgeon: Garner Nash, DO;  Location: [facility];  Service: Pulmonary;;   FOOT SURGERY      bilateral bunions and hammer toes   LUNG BIOPSY  08/13/2019   Procedure: LUNG BIOPSY;  Surgeon: Garner Nash, DO;  Location: [facility];  Service: Pulmonary;;   MULTIPLE TOOTH EXTRACTIONS     VIDEO BRONCHOSCOPY WITH ENDOBRONCHIAL NAVIGATION Right 08/13/2019   Procedure: VIDEO BRONCHOSCOPY WITH ENDOBRONCHIAL NAVIGATION;  Surgeon: Garner Nash, DO;  Location: [facility];  Service: Pulmonary;  Laterality: Right;   VIDEO BRONCHOSCOPY WITH ENDOBRONCHIAL ULTRASOUND Right 08/13/2019   Procedure: VIDEO BRONCHOSCOPY WITH ENDOBRONCHIAL ULTRASOUND;  Surgeon: Garner Nash, DO;  Location: [facility];  Service: Pulmonary;  Laterality: Right;    REVIEW OF SYSTEMS:   Review of Systems  Constitutional: Negative for appetite change, chills, fatigue, fever and unexpected  weight change.  HENT:  Positive for sore throat and odynphagia. Negative for mouth sores, nosebleeds, and trouble swallowing.   Eyes: Negative for eye problems and icterus.  Respiratory: Negative for cough, hemoptysis, shortness of breath and wheezing.   Cardiovascular: Negative for chest pain and leg swelling.  Gastrointestinal: Positive for frequent constipation. Negative for abdominal pain, diarrhea, nausea and vomiting.  Genitourinary: Negative for bladder incontinence, difficulty urinating, dysuria, frequency and hematuria.   Musculoskeletal: Positive for back pain. Negative for gait problem, neck pain and neck stiffness.  Skin: Negative for itching and rash.  Neurological: Negative for dizziness, extremity weakness, gait problem, headaches, light-headedness and seizures.  Hematological: Negative for adenopathy. Does not bruise/bleed easily.  Psychiatric/Behavioral: Negative for confusion, depression and sleep disturbance. The patient is not nervous/anxious.     PHYSICAL EXAMINATION:  Blood pressure 138/86, pulse 91, temperature 97.9 F (36.6 C), temperature source Oral, resp. rate (!) 22, weight 175 lb 14.4 oz (79.8  kg), SpO2 100 %.  ECOG PERFORMANCE STATUS: 1  Physical Exam  Constitutional: Oriented to person, place, and time and well-developed, well-nourished, and in no distress.  HENT:  Head: Normocephalic and atraumatic.  Mouth/Throat: Oropharynx is clear and moist. No oropharyngeal exudate.  Eyes: Conjunctivae are normal. Right eye exhibits no discharge. Left eye exhibits no discharge. No scleral icterus.  Neck: Normal range of motion. Neck supple.  Cardiovascular: Normal rate, regular rhythm, normal heart sounds and intact distal pulses.   Pulmonary/Chest: Effort normal and breath sounds normal. No respiratory distress. No wheezes. No rales.  Abdominal: Soft. Bowel sounds are normal. Exhibits no distension and no mass. There is no tenderness.  Musculoskeletal: Normal range of motion. Exhibits no edema.  Lymphadenopathy:    No cervical adenopathy.  Neurological: Alert and oriented to person, place, and time. Exhibits normal muscle tone. Gait normal. Coordination normal.  Skin: Skin is warm and dry. No rash noted. Not diaphoretic. No erythema. No pallor.  Psychiatric: Mood, memory and judgment normal.  Vitals reviewed.  LABORATORY DATA: Lab Results  Component Value Date   WBC 17.7 (H) 03/13/2022   HGB 13.9 03/13/2022   HCT 44.8 03/13/2022   MCV 93.3 03/13/2022   PLT 428 (H) 03/13/2022      Chemistry      Component Value Date/Time   NA 137 03/13/2022 0631   NA 142 07/22/2019 1522   K 5.0 03/13/2022 0631   CL 102 03/13/2022 0631   CO2 26 03/13/2022 0631   BUN 27 (H) 03/13/2022 0631   BUN 13 07/22/2019 1522   CREATININE 0.85 03/13/2022 0631   CREATININE 0.96 02/14/2021 1528   CREATININE 0.91 09/16/2011 1000      Component Value Date/Time   CALCIUM 9.5 03/13/2022 0631   ALKPHOS 117 03/13/2022 0631   AST 15 03/13/2022 0631   AST 21 02/14/2021 1528   ALT 15 03/13/2022 0631   ALT 16 02/14/2021 1528   BILITOT 0.8 03/13/2022 0631   BILITOT 0.7 02/14/2021 1528        RADIOGRAPHIC STUDIES:  CT BONE TROCAR/NEEDLE BIOPSY DEEP  Result Date: 03/11/2022 CLINICAL DATA:  Metastatic disease of the spine involving thoracic and lumbar vertebral bodies with history of lung carcinoma. The most significant vertebral lesion is at the T9 level and also involving posterior elements of T9 including the right-sided transverse process and costovertebral junction. EXAM: CT GUIDED CORE BIOPSY OF BODY PART ANESTHESIA/SEDATION: Moderate (conscious) sedation was employed during this procedure. A total of Versed 2.0 mg and Fentanyl 100 mcg was  administered intravenously by radiology nursing. Moderate Sedation Time: 17 minutes. The patient's level of consciousness and vital signs were monitored continuously by radiology nursing throughout the procedure under my direct supervision. PROCEDURE: The procedure risks, benefits, and alternatives were explained to the patient. Questions regarding the procedure were encouraged and answered. The patient understands and consents to the procedure. A time-out was performed prior to initiating the procedure. CT was performed through the mid to lower thoracic region in a prone position. The posterior thoracic region was prepped with chlorhexidine in a sterile fashion, and a sterile drape was applied covering the operative field. A sterile gown and sterile gloves were used for the procedure. Local anesthesia was provided with 1% Lidocaine. Under CT guidance, a 17 gauge trocar needle was advanced to the level of a lesion involving the right-sided transverse process and costovertebral junction of the T9 vertebral body. After confirming needle tip position, 3 separate core biopsy samples were obtained with an 18 gauge biopsy device. Samples were submitted in formalin. Additional CT was performed after outer needle removal. RADIATION DOSE REDUCTION: This exam was performed according to the departmental dose-optimization program which includes automated exposure  control, adjustment of the mA and/or kV according to patient size and/or use of iterative reconstruction technique. COMPLICATIONS: None FINDINGS: Tumor involving the T9 vertebral body at the level of right-sided posterior elements was targeted. Fragments of solid tissue were obtained. IMPRESSION: CT-guided core biopsy performed of tumor involving the T9 vertebral body at the level of the right-sided transverse process and right costovertebral junction. Electronically Signed   By: Aletta Edouard M.D.   On: 03/11/2022 14:34   MR BRAIN W WO CONTRAST  Result Date: 03/08/2022 CLINICAL DATA:  Metastatic disease evaluation. EXAM: MRI HEAD WITHOUT AND WITH CONTRAST TECHNIQUE: Multiplanar, multiecho pulse sequences of the brain and surrounding structures were obtained without and with intravenous contrast. CONTRAST:  65m GADAVIST GADOBUTROL 1 MMOL/ML IV SOLN COMPARISON:  Head CT 02/16/2005 FINDINGS: Brain: There is no evidence of an acute infarct, intracranial hemorrhage, mass, midline shift, or extra-axial fluid collection. There is mild cerebral atrophy. Small T2 hyperintensities in the cerebral white matter bilaterally are nonspecific but compatible with minimal chronic small vessel ischemic disease. No abnormal enhancement is identified. Vascular: Major intracranial vascular flow voids are preserved. Skull and upper cervical spine: 1.3 cm sclerotic focus in the right frontal calvarium, not felt to reflect active metastatic disease as this was also present in 2006 and is without appreciable enhancement. Disc degeneration and asymmetric left-sided facet arthrosis at C3-4 and C4-5. Sinuses/Orbits: Unremarkable orbits. Paranasal sinuses and mastoid air cells are clear. Other: None. IMPRESSION: No evidence of intracranial metastases. Electronically Signed   By: ALogan BoresM.D.   On: 03/08/2022 14:30   MR THORACIC SPINE W WO CONTRAST  Result Date: 03/07/2022 CLINICAL DATA:  Metastatic disease evaluation EXAM: MRI  THORACIC AND LUMBAR SPINE WITHOUT AND WITH CONTRAST TECHNIQUE: Multiplanar and multiecho pulse sequences of the thoracic and lumbar spine were obtained without and with intravenous contrast. CONTRAST:  865mGADAVIST GADOBUTROL 1 MMOL/ML IV SOLN COMPARISON:  No prior MRI, correlation is made with same day CT chest abdomen pelvis. FINDINGS: MRI THORACIC SPINE FINDINGS Alignment: S shaped curvature of the thoracolumbar spine. 3 mm anterolisthesis of C7 on T1. No significant listhesis in the thoracic spine. Vertebrae: Abnormal marrow signal in the T1 vertebral body, extending into the pedicles; in the spinous process and left pedicle of T8 (series 17, images 8 and 11); in the posterior aspect  of T9 and in the posterior elements, described in more detail below; at the superior aspect of T10 and in the spinous process (series 17, images 10 and 11). The majority of the T9 vertebral body is occupied by an expansile lesion, which extends into the right greater than left posterior elements and proximal right ninth and tenth ribs (series 30, image 32), as well as likely the right eighth rib. This circumferentially narrows the spinal canal, causing severe spinal canal stenosis posterior to the T9 vertebral body, as well as moderate right neural foraminal narrowing at T8-T9 and severe right neural foraminal narrowing at T9-T10. While the majority of the aforementioned lesions do not demonstrate significant enhancement, the T9 lesion and its components in the right ninth and tenth ribs do enhance. Cord: Deformation of the spinal cord posterior to the T9 vertebral body. No abnormal spinal cord signal. Otherwise normal morphology. Paraspinal and other soft tissues: No acute finding. Hepatic cysts, which are better evaluate CT. Disc levels: C6-C7: Moderate spinal canal stenosis. C7-T1: Mild-to-moderate spinal canal stenosis. T8-T9: No spinal canal stenosis. Moderate right neural foraminal narrowing. T9: Severe spinal canal stenosis.  T9-T10: Mild to moderate spinal canal stenosis. Severe right neural foraminal narrowing. MRI LUMBAR SPINE FINDINGS Segmentation:  5 lumbar type vertebral bodies. Alignment: S shaped curvature of the thoracolumbar spine. Trace anterolisthesis of L3 on L4. Vertebrae: Abnormal marrow signal in the left posterior aspect of L3, involving the anterior aspect of the pedicle (series 23, image 11). Abnormal signal involving the right greater than left posterior elements of L4 (series 23, images 5-12). Abnormal signal involving the majority of the L5 vertebral body. The pedicles do not appear to be significantly involved, although there is some abnormal signal in the right inferior to killer facet (series 23, image 7). Conus medullaris: Extends to the L2 level and appears normal. No definite abnormal enhancement. Several nerve roots at the level of L5-S1 demonstrate mildly increased T1 signal (series 27, image 31). Paraspinal and other soft tissues: Indeterminate right renal mass, which is better evaluated on the same day CT abdomen pelvis. Disc levels: T12-L1: No significant disc bulge. No spinal canal stenosis or neural foraminal narrowing. L1-L2: No significant disc bulge. Mild facet arthropathy. No spinal canal stenosis. Mild right neural foraminal narrowing. L2-L3: Mild disc bulge. Mild facet arthropathy. Ligamentum flavum hypertrophy. Borderline spinal canal stenosis. Mild right neural foraminal narrowing. L3-L4: Trace anterolisthesis with mild disc bulge and disc unroofing. Mild-to-moderate facet arthropathy. Ligamentum flavum hypertrophy. Narrowing of the lateral recesses. No spinal canal stenosis or neural foraminal narrowing. L4-L5: Mild disc bulge. Mild facet arthropathy. Ligamentum flavum hypertrophy. Narrowing of the lateral recesses. No spinal canal stenosis. No neural foraminal narrowing. Disc height loss with right eccentric disc osteophyte complex. No spinal canal stenosis. No definite neural foraminal  narrowing. L5-S1: No significant disc bulge. No spinal canal stenosis or neural foraminal narrowing. IMPRESSION: 1. 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 This causes severe spinal canal stenosis, with deformation of the spinal cord but no abnormal cord signal, as well as mild to moderate spinal canal stenosis at T9-T10, moderate neural foraminal narrowing T8-T9, and severe right neural foraminal narrowing at T9-T10. 2. Additional metastatic disease is seen at T1, T8, T10, L3, L4, and L5, as described above. 3. C6-C7 moderate spinal canal stenosis.  4. C7-T1 mild-to-moderate spinal canal stenosis. 5. L3-L4 and L4-L5 narrowing of the lateral recesses, which could affect the descending L4 and L5 nerve roots, respectively. 6. L1-L2 and L2-L3 mild right neural foraminal narrowing. 7. Indeterminate right renal mass, which is better evaluated on the same day CT abdomen pelvis. Electronically Signed   By: Merilyn Baba M.D.   On: 03/07/2022 00:03   MR Lumbar Spine W Wo Contrast  Result Date: 03/07/2022 CLINICAL DATA:  Metastatic disease evaluation EXAM: MRI THORACIC AND LUMBAR SPINE WITHOUT AND WITH CONTRAST TECHNIQUE: Multiplanar and multiecho pulse sequences of the thoracic and lumbar spine were obtained without and with intravenous contrast. CONTRAST:  62m GADAVIST GADOBUTROL 1 MMOL/ML IV SOLN COMPARISON:  No prior MRI, correlation is made with same day CT chest abdomen pelvis. FINDINGS: MRI THORACIC SPINE FINDINGS Alignment: S shaped curvature of the thoracolumbar spine. 3 mm anterolisthesis of C7 on T1. No significant listhesis in the thoracic spine. Vertebrae: Abnormal marrow signal in the T1 vertebral body, extending into the pedicles; in the spinous process and left pedicle of T8  (series 17, images 8 and 11); in the posterior aspect of T9 and in the posterior elements, described in more detail below; at the superior aspect of T10 and in the spinous process (series 17, images 10 and 11). The majority of the T9 vertebral body is occupied by an expansile lesion, which extends into the right greater than left posterior elements and proximal right ninth and tenth ribs (series 30, image 32), as well as likely the right eighth rib. This circumferentially narrows the spinal canal, causing severe spinal canal stenosis posterior to the T9 vertebral body, as well as moderate right neural foraminal narrowing at T8-T9 and severe right neural foraminal narrowing at T9-T10. While the majority of the aforementioned lesions do not demonstrate significant enhancement, the T9 lesion and its components in the right ninth and tenth ribs do enhance. Cord: Deformation of the spinal cord posterior to the T9 vertebral body. No abnormal spinal cord signal. Otherwise normal morphology. Paraspinal and other soft tissues: No acute finding. Hepatic cysts, which are better evaluate CT. Disc levels: C6-C7: Moderate spinal canal stenosis. C7-T1: Mild-to-moderate spinal canal stenosis. T8-T9: No spinal canal stenosis. Moderate right neural foraminal narrowing. T9: Severe spinal canal stenosis. T9-T10: Mild to moderate spinal canal stenosis. Severe right neural foraminal narrowing. MRI LUMBAR SPINE FINDINGS Segmentation:  5 lumbar type vertebral bodies. Alignment: S shaped curvature of the thoracolumbar spine. Trace anterolisthesis of L3 on L4. Vertebrae: Abnormal marrow signal in the left posterior aspect of L3, involving the anterior aspect of the pedicle (series 23, image 11). Abnormal signal involving the right greater than left posterior elements of L4 (series 23, images 5-12). Abnormal signal involving the majority of the L5 vertebral body. The pedicles do not appear to be significantly involved, although there is some  abnormal signal in the right inferior to killer facet (series 23, image 7). Conus medullaris: Extends to the L2 level and appears normal. No definite abnormal enhancement. Several nerve roots at the level of L5-S1 demonstrate mildly increased T1 signal (series 27, image 31). Paraspinal and other soft tissues: Indeterminate right renal mass, which is better evaluated on the same day CT abdomen pelvis. Disc levels: T12-L1: No significant disc bulge. No spinal canal stenosis or neural foraminal narrowing. L1-L2: No significant disc bulge. Mild facet arthropathy. No spinal canal stenosis. Mild right neural foraminal narrowing. L2-L3: Mild disc bulge. Mild facet arthropathy. Ligamentum flavum hypertrophy. Borderline spinal canal stenosis.  Mild right neural foraminal narrowing. L3-L4: Trace anterolisthesis with mild disc bulge and disc unroofing. Mild-to-moderate facet arthropathy. Ligamentum flavum hypertrophy. Narrowing of the lateral recesses. No spinal canal stenosis or neural foraminal narrowing. L4-L5: Mild disc bulge. Mild facet arthropathy. Ligamentum flavum hypertrophy. Narrowing of the lateral recesses. No spinal canal stenosis. No neural foraminal narrowing. Disc height loss with right eccentric disc osteophyte complex. No spinal canal stenosis. No definite neural foraminal narrowing. L5-S1: No significant disc bulge. No spinal canal stenosis or neural foraminal narrowing. IMPRESSION: 1. 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 This causes severe spinal canal stenosis, with deformation of the spinal cord but no abnormal cord signal, as well as mild to moderate spinal canal stenosis at T9-T10, moderate neural foraminal narrowing T8-T9, and severe right neural foraminal narrowing at  T9-T10. 2. Additional metastatic disease is seen at T1, T8, T10, L3, L4, and L5, as described above. 3. C6-C7 moderate spinal canal stenosis. 4. C7-T1 mild-to-moderate spinal canal stenosis. 5. L3-L4 and L4-L5 narrowing of the lateral recesses, which could affect the descending L4 and L5 nerve roots, respectively. 6. L1-L2 and L2-L3 mild right neural foraminal narrowing. 7. Indeterminate right renal mass, which is better evaluated on the same day CT abdomen pelvis. Electronically Signed   By: Merilyn Baba M.D.   On: 03/07/2022 00:03   CT Angio Chest Pulmonary Embolism (PE) W or WO Contrast  Result Date: 03/06/2022 CLINICAL DATA:  Shortness of breath with chest pain. Acute abdominal pain. History of non-small cell lung cancer status post right lobectomy. EXAM: CT ANGIOGRAPHY CHEST CT ABDOMEN AND PELVIS WITH CONTRAST TECHNIQUE: Multidetector CT imaging of the chest was performed using the standard protocol during bolus administration of intravenous contrast. Multiplanar CT image reconstructions and MIPs were obtained to evaluate the vascular anatomy. Multidetector CT imaging of the abdomen and pelvis was performed using the standard protocol during bolus administration of intravenous contrast. RADIATION DOSE REDUCTION: This exam was performed according to the departmental dose-optimization program which includes automated exposure control, adjustment of the mA and/or kV according to patient size and/or use of iterative reconstruction technique. CONTRAST:  171m OMNIPAQUE IOHEXOL 350 MG/ML SOLN COMPARISON:  MRI abdomen 12/02/2021. CT chest 08/13/2021. chest CT 02/24/2020. FINDINGS: CTA CHEST FINDINGS Cardiovascular: Satisfactory opacification of the pulmonary arteries to the segmental level. No evidence of pulmonary embolism. Normal heart size. No pericardial effusion. Mediastinum/Nodes: No enlarged mediastinal, hilar, or axillary lymph nodes. Thyroid gland, trachea, and esophagus demonstrate no significant  findings. Lungs/Pleura: Right upper lobectomy changes are again seen. There is stable scarring and bronchiectasis in the right upper lobe. There is some scattered peripheral reticular opacities which are nonspecific throughout both lower lungs. There is no focal lung infiltrate, pleural effusion or pneumothorax. There is a 3 mm nodule in the left upper lobe image 5/1 which is unchanged from most recent prior. This is new compared to 2021. No new pulmonary nodules are seen. No pleural effusion or pneumothorax. Musculoskeletal: There is new metastatic disease in the posterior elements T9, right greater than left, extending into the posterior right ninth rib. There is soft tissue extension into the central spinal canal causing central spinal canal stenosis. There are healed posterior right rib fractures. Metastatic osseous lesion in the T1 vertebral body has increased in size now measuring 14 mm (previously  8 mm). Review of the MIP images confirms the above findings. CT ABDOMEN and PELVIS FINDINGS Hepatobiliary: Scattered rounded hypodense lesions in the liver are unchanged and favored as small cysts. No new liver lesions. Gallbladder and bile ducts are within normal limits. Pancreas: Unremarkable. No pancreatic ductal dilatation or surrounding inflammatory changes. Spleen: Normal in size without focal abnormality. Adrenals/Urinary Tract: Rounded right renal mass measuring 2.2 cm is unchanged. Otherwise, adrenal glands, kidneys and bladder are within normal limits. Stomach/Bowel: Stomach is within normal limits. Appendix appears normal. No evidence of bowel wall thickening, distention, or inflammatory changes. There is a large amount of stool throughout the colon. Vascular/Lymphatic: Aortic atherosclerosis. No enlarged abdominal or pelvic lymph nodes. Reproductive: Status post hysterectomy. No adnexal masses. Other: No ascites or free air. Musculoskeletal: Sclerotic osseous metastatic disease has increased in the  spine, most significantly at the L5 level measuring up to 4.5 cm (previously 3.1 cm). No acute fractures are seen. Review of the MIP images confirms the above findings. IMPRESSION: 1. No evidence for pulmonary embolism. 2. Stable right upper lobectomy changes. 3. Stable 3 mm left upper lobe pulmonary nodule. 4. New metastatic disease in the posterior elements of T9 with soft tissue extension into the central spinal canal causing central spinal canal stenosis. Correlate for signs of cord compression. This can be further evaluated with MRI. 5. Worsening osseous metastatic disease. 6. No acute localizing process in the abdomen or pelvis. 7. Stable right renal mass worrisome for renal cell carcinoma. Aortic Atherosclerosis (ICD10-I70.0). Electronically Signed   By: Ronney Asters M.D.   On: 03/06/2022 20:14   CT ABDOMEN PELVIS W CONTRAST  Result Date: 03/06/2022 CLINICAL DATA:  Shortness of breath with chest pain. Acute abdominal pain. History of non-small cell lung cancer status post right lobectomy. EXAM: CT ANGIOGRAPHY CHEST CT ABDOMEN AND PELVIS WITH CONTRAST TECHNIQUE: Multidetector CT imaging of the chest was performed using the standard protocol during bolus administration of intravenous contrast. Multiplanar CT image reconstructions and MIPs were obtained to evaluate the vascular anatomy. Multidetector CT imaging of the abdomen and pelvis was performed using the standard protocol during bolus administration of intravenous contrast. RADIATION DOSE REDUCTION: This exam was performed according to the departmental dose-optimization program which includes automated exposure control, adjustment of the mA and/or kV according to patient size and/or use of iterative reconstruction technique. CONTRAST:  168m OMNIPAQUE IOHEXOL 350 MG/ML SOLN COMPARISON:  MRI abdomen 12/02/2021. CT chest 08/13/2021. chest CT 02/24/2020. FINDINGS: CTA CHEST FINDINGS Cardiovascular: Satisfactory opacification of the pulmonary arteries to  the segmental level. No evidence of pulmonary embolism. Normal heart size. No pericardial effusion. Mediastinum/Nodes: No enlarged mediastinal, hilar, or axillary lymph nodes. Thyroid gland, trachea, and esophagus demonstrate no significant findings. Lungs/Pleura: Right upper lobectomy changes are again seen. There is stable scarring and bronchiectasis in the right upper lobe. There is some scattered peripheral reticular opacities which are nonspecific throughout both lower lungs. There is no focal lung infiltrate, pleural effusion or pneumothorax. There is a 3 mm nodule in the left upper lobe image 5/1 which is unchanged from most recent prior. This is new compared to 2021. No new pulmonary nodules are seen. No pleural effusion or pneumothorax. Musculoskeletal: There is new metastatic disease in the posterior elements T9, right greater than left, extending into the posterior right ninth rib. There is soft tissue extension into the central spinal canal causing central spinal canal stenosis. There are healed posterior right rib fractures. Metastatic osseous lesion in the T1 vertebral body has increased  in size now measuring 14 mm (previously 8 mm). Review of the MIP images confirms the above findings. CT ABDOMEN and PELVIS FINDINGS Hepatobiliary: Scattered rounded hypodense lesions in the liver are unchanged and favored as small cysts. No new liver lesions. Gallbladder and bile ducts are within normal limits. Pancreas: Unremarkable. No pancreatic ductal dilatation or surrounding inflammatory changes. Spleen: Normal in size without focal abnormality. Adrenals/Urinary Tract: Rounded right renal mass measuring 2.2 cm is unchanged. Otherwise, adrenal glands, kidneys and bladder are within normal limits. Stomach/Bowel: Stomach is within normal limits. Appendix appears normal. No evidence of bowel wall thickening, distention, or inflammatory changes. There is a large amount of stool throughout the colon.  Vascular/Lymphatic: Aortic atherosclerosis. No enlarged abdominal or pelvic lymph nodes. Reproductive: Status post hysterectomy. No adnexal masses. Other: No ascites or free air. Musculoskeletal: Sclerotic osseous metastatic disease has increased in the spine, most significantly at the L5 level measuring up to 4.5 cm (previously 3.1 cm). No acute fractures are seen. Review of the MIP images confirms the above findings. IMPRESSION: 1. No evidence for pulmonary embolism. 2. Stable right upper lobectomy changes. 3. Stable 3 mm left upper lobe pulmonary nodule. 4. New metastatic disease in the posterior elements of T9 with soft tissue extension into the central spinal canal causing central spinal canal stenosis. Correlate for signs of cord compression. This can be further evaluated with MRI. 5. Worsening osseous metastatic disease. 6. No acute localizing process in the abdomen or pelvis. 7. Stable right renal mass worrisome for renal cell carcinoma. Aortic Atherosclerosis (ICD10-I70.0). Electronically Signed   By: Ronney Asters M.D.   On: 03/06/2022 20:14   DG Chest 2 View  Result Date: 03/06/2022 CLINICAL DATA:  Chest pain. EXAM: CHEST - 2 VIEW COMPARISON:  06/15/2020 FINDINGS: The cardiac silhouette, mediastinal and hilar contours are within normal limits and stable. Stable tortuosity of the thoracic aorta. Chronic emphysematous changes and remote surgical changes involving the right lung. Probable radiation changes also. No acute pulmonary findings or worrisome pulmonary lesions. IMPRESSION: Chronic lung changes but no acute pulmonary findings. Electronically Signed   By: Marijo Sanes M.D.   On: 03/06/2022 17:20     ASSESSMENT/PLAN:  This is a very pleasant 71 year old female with metastatic non-small cell lung cancer, adenocarcinoma that was initially diagnosed with stage IIb non-small cell lung cancer, adenocarcinoma in 2021 in [place].  She underwent a right upper lobectomy at that time.  She  declined adjuvant systemic chemotherapy.  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   She presented to the emergency room in November 2023 with significant pain.  She underwent a CT-guided biopsy of the bone lesion which was consistent with her prior non-small cell lung cancer, adenocarcinoma.   Neurosurgery was consulted and no role for decompression at this time.  The patient is currently on a Decadron taper for pain.  She is currently undergoing palliative radiation under the care of Dr. Lisbeth Renshaw to the bone lesion. Her last day of radiation is scheduled for 03/26/22.   Her pain is sufficiently managed by her tramadol which is prescribed by her PCP. She states she takes this every 8  hours.   She had a brain MRI in the hospital to complete the staging workup which was negative.  A PET scan was ordered by pulmonary medicine but this has not been completed at this time due to her hospital evaluation.  The patient was given the number to the radiology department and instructed to call upon returning home to get this scheduled at her earliest convenience.  The patient was seen with Dr. Julien Nordmann today.  Requested PD-L1 and foundation 1 testing on her recent biopsy.  We have also arrange for [facility] testing today.  Discussed with the patient that we are assessing if she is a candidate for targeted treatment.  If she is not a candidate for targeted treatment, then we will likely offer chemotherapy and immunotherapy.  The plan is to complete her PET scan and wait for the results of her molecular test which may take approximately 10 days or so.  We will  then see her back after we have all the results for more detailed discussion about her current condition and treatment plan.  Discussed with the patient that the PET scan is going to be head to thigh which will also include the kidney.  Will see her back for follow-up visit in 2 weeks or sooner for more detailed discussion about her current recommended treatment options.  The patient was advised to call immediately if she has any concerning symptoms in the interval. The patient voices understanding of current disease status and treatment options and is in agreement with the current care plan. All questions were answered. The patient knows to call the clinic with any problems, questions or concerns. We can certainly see the patient much sooner if necessary     Orders Placed This Encounter  Procedures   [facility]    Standing Status:   Future    Standing Expiration Date:   03/21/2023      Tobe Sos Broox Lonigro, PA-C 03/21/22  ADDENDUM: Hematology/Oncology Attending: I had a face-to-face encounter with the patient today.  I reviewed her lab, scan, records and recommended her care plan.  This is a very pleasant 71 years old African-American female with now metastatic non-small cell lung cancer that was initially diagnosed as a stage IIb (T1b, N1, M0) adenocarcinoma in June 2021 status post right upper lobectomy with lymph node dissection in [place] and she declined adjuvant systemic chemotherapy at that time. The patient was found to have evidence for metastatic disease with extensive osseous metastasis in November 2023.  She had CT-guided biopsy of T9 vertebral body lesion that showed metastatic adenocarcinoma to the bone. I had a lengthy discussion with the patient today about her current condition and treatment options. I recommended for the patient to have molecular studies to identify any actionable mutations that could be used as a treatment option. Will complete the  staging workup by ordering a PET scan to rule out any other areas of metastatic disease. Will also send blood test to [facility] for molecular studies. The patient will come back for follow-up visit in around 2 weeks for evaluation and more detailed discussion of her treatment options based on the imaging studies and molecular studies. The patient was advised to call immediately if she has any other concerning symptoms in the interval. The total time spent in the appointment was 40 minutes. Disclaimer: This note was dictated with voice recognition software. Similar sounding words can inadvertently be transcribed and may be missed upon review. Eilleen Kempf, MD

## 2022-03-21 ENCOUNTER — Other Ambulatory Visit: Payer: Self-pay

## 2022-03-21 ENCOUNTER — Inpatient Hospital Stay: Payer: Medicare HMO | Attending: Physician Assistant | Admitting: Physician Assistant

## 2022-03-21 ENCOUNTER — Inpatient Hospital Stay: Payer: Medicare HMO

## 2022-03-21 ENCOUNTER — Ambulatory Visit
Admission: RE | Admit: 2022-03-21 | Discharge: 2022-03-21 | Disposition: A | Discharge status: Home / Self Care | Payer: Medicare HMO | Source: Ambulatory Visit | Attending: Radiation Oncology | Admitting: Radiation Oncology

## 2022-03-21 ENCOUNTER — Telehealth: Payer: Self-pay

## 2022-03-21 ENCOUNTER — Ambulatory Visit: Payer: Medicare HMO

## 2022-03-21 VITALS — BP 138/86 | HR 91 | Temp 97.9°F | Resp 22 | Wt 175.9 lb

## 2022-03-21 DIAGNOSIS — C3491 Malignant neoplasm of unspecified part of right bronchus or lung: Secondary | ICD-10-CM

## 2022-03-21 DIAGNOSIS — C7951 Secondary malignant neoplasm of bone: Secondary | ICD-10-CM

## 2022-03-21 DIAGNOSIS — Z510 Encounter for antineoplastic radiation therapy: Secondary | ICD-10-CM | POA: Diagnosis not present

## 2022-03-21 LAB — RAD ONC ARIA SESSION SUMMARY
Course Elapsed Days: 8
Plan Fractions Treated to Date: 7
Plan Prescribed Dose Per Fraction: 3 Gy
Plan Total Fractions Prescribed: 10
Plan Total Prescribed Dose: 30 Gy
Reference Point Dosage Given to Date: 21 Gy
Reference Point Session Dosage Given: 3 Gy
Session Number: 7

## 2022-03-21 NOTE — Patient Instructions (Addendum)
-  It was nice meeting you today. -When they did the biopsy on the spot on your spine, pathologist looks at this under a microscope and run special test on this.  When the pathologist looked at this under the microscope, this is consistent with lung cancer that you had previously and it has now spread to the bones.  Because lung cancer has now spread from the site of origin (lung) to the bone, this makes it metastatic disease also equivalent to stage IV. -Next step is to make sure we are not seeing any other places that the cancer has spread.  I have given you the number to the radiology scheduling.  Please call [phone] at your earliest convenience and schedule the scan as soon as possible. -The next step to determine what the best treatment is for you is to run some special test.  We will run this test on the piece of the cancer that they took from your bones as well as from the blood test you had drawn today.  This takes about 10 to 14 days to get the results. What this test is telling us is whether or not you are a candidate for treatment for the cancer that is a pill. -About 20% of patients with non-small cell lung cancer, adenocarcinoma have a marker that would make you a candidate for treatment that is a pill. -If you are not a candidate for treatment that is a pill, then treatment would likely be chemotherapy and immunotherapy which is given IV once every 3 weeks.  We will discuss this in more detail at your follow-up appointment if this is the route that we will go. -We will see you back for follow-up visit in 2 weeks for evaluation and to review the results of your PET scan and your special blood test that was done today.

## 2022-03-21 NOTE — Telephone Encounter (Signed)
This nurse contacted [facility] lab and requested PDL1 testing for this patient on the surgical pathology specimen from 03/11/22.  No further questions or concerns noted.   

## 2022-03-22 ENCOUNTER — Ambulatory Visit
Admission: RE | Admit: 2022-03-22 | Discharge: 2022-03-22 | Disposition: A | Discharge status: Home / Self Care | Payer: Medicare HMO | Source: Ambulatory Visit | Attending: Radiation Oncology | Admitting: Radiation Oncology

## 2022-03-22 ENCOUNTER — Other Ambulatory Visit: Payer: Self-pay

## 2022-03-22 DIAGNOSIS — Z510 Encounter for antineoplastic radiation therapy: Secondary | ICD-10-CM | POA: Diagnosis not present

## 2022-03-22 LAB — RAD ONC ARIA SESSION SUMMARY
Course Elapsed Days: 9
Plan Fractions Treated to Date: 8
Plan Prescribed Dose Per Fraction: 3 Gy
Plan Total Fractions Prescribed: 10
Plan Total Prescribed Dose: 30 Gy
Reference Point Dosage Given to Date: 24 Gy
Reference Point Session Dosage Given: 3 Gy
Session Number: 8

## 2022-03-25 ENCOUNTER — Ambulatory Visit: Payer: Medicare HMO

## 2022-03-26 ENCOUNTER — Other Ambulatory Visit: Payer: Self-pay

## 2022-03-26 ENCOUNTER — Ambulatory Visit
Admission: RE | Admit: 2022-03-26 | Discharge: 2022-03-26 | Disposition: A | Discharge status: Home / Self Care | Payer: Medicare HMO | Source: Ambulatory Visit | Attending: Radiation Oncology | Admitting: Radiation Oncology

## 2022-03-26 ENCOUNTER — Ambulatory Visit: Payer: Medicare HMO

## 2022-03-26 ENCOUNTER — Ambulatory Visit: Payer: Medicare HMO | Admitting: Radiation Oncology

## 2022-03-26 DIAGNOSIS — Z510 Encounter for antineoplastic radiation therapy: Secondary | ICD-10-CM | POA: Diagnosis not present

## 2022-03-26 LAB — RAD ONC ARIA SESSION SUMMARY
Course Elapsed Days: 13
Plan Fractions Treated to Date: 9
Plan Prescribed Dose Per Fraction: 3 Gy
Plan Total Fractions Prescribed: 10
Plan Total Prescribed Dose: 30 Gy
Reference Point Dosage Given to Date: 27 Gy
Reference Point Session Dosage Given: 3 Gy
Session Number: 9

## 2022-03-26 NOTE — Progress Notes (Signed)
                                                                                                                                                            Patient Name: Brandi Holmes MRN: [id number] DOB: 1950-06-07 Referring Physician: Lin Landsman (Profile Not Attached) Date of Service: 03/29/2022 [facility], [place]                                                        End Of Treatment Note  Diagnoses: C79.51-Secondary malignant neoplasm of bone  Cancer Staging: Recurrent metastatic Stage IIB, cT1bN1M0, NSCLC, adenocarcinoma of the RUL with thoracic spin metastasis.   Intent: Palliative  Radiation Treatment Dates: 03/13/2022 through 03/29/2022 Site Technique Total Dose (Gy) Dose per Fx (Gy) Completed Fx Beam Energies  Thoracic Spine: Spine_T9 IMRT 30/30 3 10/10 15X   Narrative: The patient tolerated radiation therapy relatively well. She remained neurologically intact during radiation. Her steroid taper began halfway through her radiation dose and she will complete this taper in the coming weeks. She knows to call if she develops weakness, loss of sensation or changes in mobility.   Plan: The patient will receive a call in about one month from the radiation oncology department. She will continue follow up with Dr. Julien Nordmann.  ________________________________________________    Carola Rhine, [facility]

## 2022-03-27 ENCOUNTER — Ambulatory Visit: Payer: Medicare HMO

## 2022-03-27 ENCOUNTER — Ambulatory Visit: Payer: Medicare HMO | Admitting: Radiation Oncology

## 2022-03-28 ENCOUNTER — Ambulatory Visit: Payer: Medicare HMO

## 2022-03-28 NOTE — Telephone Encounter (Signed)
error 

## 2022-03-29 ENCOUNTER — Ambulatory Visit
Admission: RE | Admit: 2022-03-29 | Discharge: 2022-03-29 | Disposition: A | Discharge status: Home / Self Care | Payer: Medicare HMO | Source: Ambulatory Visit | Attending: Radiation Oncology | Admitting: Radiation Oncology

## 2022-03-29 ENCOUNTER — Telehealth: Payer: Self-pay | Admitting: Internal Medicine

## 2022-03-29 ENCOUNTER — Other Ambulatory Visit: Payer: Self-pay | Admitting: Radiation Oncology

## 2022-03-29 ENCOUNTER — Other Ambulatory Visit: Payer: Self-pay

## 2022-03-29 ENCOUNTER — Encounter: Payer: Self-pay | Admitting: Radiation Oncology

## 2022-03-29 DIAGNOSIS — Z510 Encounter for antineoplastic radiation therapy: Secondary | ICD-10-CM | POA: Diagnosis not present

## 2022-03-29 LAB — RAD ONC ARIA SESSION SUMMARY
Course Elapsed Days: 16
Plan Fractions Treated to Date: 10
Plan Prescribed Dose Per Fraction: 3 Gy
Plan Total Fractions Prescribed: 10
Plan Total Prescribed Dose: 30 Gy
Reference Point Dosage Given to Date: 30 Gy
Reference Point Session Dosage Given: 3 Gy
Session Number: 10

## 2022-03-29 MED ORDER — FLUCONAZOLE 100 MG PO TABS: 100.0000 mg | ORAL_TABLET | Freq: Every day | ORAL | 0 refills | Status: DC

## 2022-03-29 NOTE — Telephone Encounter (Signed)
Called patient regarding upcoming December appointment, patient is notified.

## 2022-04-02 ENCOUNTER — Telehealth: Payer: Self-pay | Admitting: *Deleted

## 2022-04-02 ENCOUNTER — Encounter: Payer: Self-pay | Admitting: *Deleted

## 2022-04-02 ENCOUNTER — Encounter: Payer: Self-pay | Admitting: Internal Medicine

## 2022-04-02 NOTE — Telephone Encounter (Signed)
Attempted to call patient to see how she was doing with her steroid taper.  Unable to reach the patient and her voicemail box is full.  Will attempt a mychart message.  Brandi Holmes. Leonie Green, BSN

## 2022-04-03 LAB — GUARDANT 360

## 2022-04-04 ENCOUNTER — Inpatient Hospital Stay: Payer: Medicare HMO | Admitting: Internal Medicine

## 2022-04-04 ENCOUNTER — Telehealth: Payer: Self-pay | Admitting: Internal Medicine

## 2022-04-04 ENCOUNTER — Inpatient Hospital Stay: Payer: Medicare HMO

## 2022-04-04 NOTE — Telephone Encounter (Signed)
Patient called to r/s missed appointment. Patient notified of new appointment time/date.

## 2022-04-05 ENCOUNTER — Inpatient Hospital Stay: Payer: Medicare HMO

## 2022-04-05 ENCOUNTER — Ambulatory Visit (HOSPITAL_COMMUNITY)
Admission: RE | Admit: 2022-04-05 | Discharge: 2022-04-05 | Disposition: A | Discharge status: Home / Self Care | Payer: Medicare HMO | Source: Ambulatory Visit | Attending: Adult Health | Admitting: Adult Health

## 2022-04-05 ENCOUNTER — Other Ambulatory Visit: Payer: Self-pay

## 2022-04-05 DIAGNOSIS — R1031 Right lower quadrant pain: Secondary | ICD-10-CM | POA: Diagnosis present

## 2022-04-05 DIAGNOSIS — R911 Solitary pulmonary nodule: Secondary | ICD-10-CM | POA: Diagnosis not present

## 2022-04-05 DIAGNOSIS — R933 Abnormal findings on diagnostic imaging of other parts of digestive tract: Secondary | ICD-10-CM | POA: Diagnosis not present

## 2022-04-05 DIAGNOSIS — C7951 Secondary malignant neoplasm of bone: Secondary | ICD-10-CM | POA: Diagnosis not present

## 2022-04-05 DIAGNOSIS — I700 Atherosclerosis of aorta: Secondary | ICD-10-CM | POA: Insufficient documentation

## 2022-04-05 DIAGNOSIS — R0781 Pleurodynia: Secondary | ICD-10-CM | POA: Diagnosis present

## 2022-04-05 DIAGNOSIS — R0609 Other forms of dyspnea: Secondary | ICD-10-CM | POA: Diagnosis present

## 2022-04-05 DIAGNOSIS — C3491 Malignant neoplasm of unspecified part of right bronchus or lung: Secondary | ICD-10-CM | POA: Insufficient documentation

## 2022-04-05 LAB — CBC WITH DIFFERENTIAL/PLATELET
Abs Immature Granulocytes: 0.01 10*3/uL (ref 0.00–0.07)
Basophils Absolute: 0 10*3/uL (ref 0.0–0.1)
Basophils Relative: 0 %
Eosinophils Absolute: 0.1 10*3/uL (ref 0.0–0.5)
Eosinophils Relative: 1 %
HCT: 39.4 % (ref 36.0–46.0)
Hemoglobin: 12.7 g/dL (ref 12.0–15.0)
Immature Granulocytes: 0 %
Lymphocytes Relative: 4 %
Lymphs Abs: 0.3 10*3/uL — ABNORMAL LOW (ref 0.7–4.0)
MCH: 29.3 pg (ref 26.0–34.0)
MCHC: 32.2 g/dL (ref 30.0–36.0)
MCV: 90.8 fL (ref 80.0–100.0)
Monocytes Absolute: 0.6 10*3/uL (ref 0.1–1.0)
Monocytes Relative: 8 %
Neutro Abs: 6.3 10*3/uL (ref 1.7–7.7)
Neutrophils Relative %: 87 %
Platelets: 265 10*3/uL (ref 150–400)
RBC: 4.34 MIL/uL (ref 3.87–5.11)
RDW: 12.7 % (ref 11.5–15.5)
WBC: 7.2 10*3/uL (ref 4.0–10.5)
nRBC: 0 % (ref 0.0–0.2)

## 2022-04-05 LAB — GLUCOSE, CAPILLARY: Glucose-Capillary: 83 mg/dL (ref 70–99)

## 2022-04-05 MED ORDER — FLUDEOXYGLUCOSE F - 18 (FDG) INJECTION
9.1000 | Freq: Once | INTRAVENOUS | Status: AC
  Administered 2022-04-05: 8.2 via INTRAVENOUS

## 2022-04-10 ENCOUNTER — Inpatient Hospital Stay (HOSPITAL_BASED_OUTPATIENT_CLINIC_OR_DEPARTMENT_OTHER): Payer: Medicare HMO | Admitting: Internal Medicine

## 2022-04-10 ENCOUNTER — Other Ambulatory Visit: Payer: Self-pay

## 2022-04-10 ENCOUNTER — Inpatient Hospital Stay: Payer: Medicare HMO

## 2022-04-10 VITALS — BP 87/56 | HR 98 | Temp 98.1°F | Resp 18 | Wt 167.6 lb

## 2022-04-10 DIAGNOSIS — Z5111 Encounter for antineoplastic chemotherapy: Secondary | ICD-10-CM

## 2022-04-10 DIAGNOSIS — E538 Deficiency of other specified B group vitamins: Secondary | ICD-10-CM

## 2022-04-10 DIAGNOSIS — C7951 Secondary malignant neoplasm of bone: Secondary | ICD-10-CM | POA: Insufficient documentation

## 2022-04-10 DIAGNOSIS — I10 Essential (primary) hypertension: Secondary | ICD-10-CM | POA: Insufficient documentation

## 2022-04-10 DIAGNOSIS — C3411 Malignant neoplasm of upper lobe, right bronchus or lung: Secondary | ICD-10-CM | POA: Diagnosis present

## 2022-04-10 DIAGNOSIS — K5900 Constipation, unspecified: Secondary | ICD-10-CM | POA: Insufficient documentation

## 2022-04-10 DIAGNOSIS — Z90710 Acquired absence of both cervix and uterus: Secondary | ICD-10-CM | POA: Diagnosis not present

## 2022-04-10 MED ORDER — CYANOCOBALAMIN 1000 MCG/ML IJ SOLN: 1000.0000 ug | Freq: Once | INTRAMUSCULAR | Status: DC

## 2022-04-10 MED ORDER — PROCHLORPERAZINE MALEATE 10 MG PO TABS: 10.0000 mg | ORAL_TABLET | Freq: Four times a day (QID) | ORAL | 0 refills | Status: AC | PRN

## 2022-04-10 MED ORDER — FOLIC ACID 1 MG PO TABS: 1.0000 mg | ORAL_TABLET | Freq: Every day | ORAL | 4 refills | Status: DC

## 2022-04-10 MED ORDER — CYANOCOBALAMIN 1000 MCG/ML IJ SOLN
1000.0000 ug | Freq: Once | INTRAMUSCULAR | Status: AC
  Administered 2022-04-10: 1000 ug via INTRAMUSCULAR

## 2022-04-10 NOTE — Progress Notes (Signed)
START OFF PATHWAY REGIMEN - Non-Small Cell Lung   [id number]:Pembrolizumab 200 mg  IV D1 + Pemetrexed 500 mg/m2 IV D1 + Carboplatin AUC=5 IV D1 q21 Days:   A cycle is every 21 days:     Pembrolizumab      Pemetrexed      Carboplatin   **Always confirm dose/schedule in your pharmacy ordering system**  Patient Characteristics: Stage IV Metastatic, Nonsquamous, Molecular Analysis Completed, Molecular Alteration Present and Eligible for Molecular Targeted Therapy, Initial Molecular Targeted Therapy, BRAF V600E Mutation Positive Therapeutic Status: Stage IV Metastatic Histology: Nonsquamous Cell Broad Molecular Profiling Status: Molecular Analysis Completed Molecular Analysis Results: Alteration Present and Eligible for Molecular Targeted Therapy Molecular Alteration Present: BRAF V600E Mutation Positive Molecular Targeted Line of Therapy: Initial Molecular Targeted Therapy Intent of Therapy: Non-Curative / Palliative Intent, Discussed with Patient

## 2022-04-10 NOTE — Progress Notes (Signed)
[facility] [phone]   [phone]  OFFICE PROGRESS NOTE  Lin [address], [place] [id number]  DIAGNOSIS: Metastatic non-small cell lung cancer, adenocarcinoma that was initially diagnosed as stage IIb (T1b, N1, M0) non-small cell lung cancer, adenocarcinoma presented with right upper lobe lung nodule  Detected Alteration(s) / Biomarker(s) Associated FDA-approved therapies Clinical Trial Availability % cfDNA or Amplification BRAF V600E approved by FDA Dabrafenib+trametinib, Encorafenib+binimetinib approved in other indication Cobimetinib, Dabrafenib, Trametinib, Vemurafenib, Vemurafenib+cobimetinib Yes 8.3%  BRCA1 R252S None (VUS) None (VUS) 0.1%   SMAD4 D460f None None 7.2%  PD-L1 expression 35%  PRIOR THERAPY:  1) Status post right upper [place]  She declines adjuvant systemic chemotherapy. 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 MLisbeth Renshaw  CURRENT THERAPY: Systemic chemotherapy with carboplatin for AUC of 5, Alimta 500 Mg/M2 and Keytruda 200 Mg IV every 3 weeks.  First dose April 16, 2022.  INTERVAL HISTORY: Brandi Holmes 71y.o. female returns to the clinic today for follow-up visit.  The patient continues to complain of increasing fatigue and weakness as well as pain in the back.  She completed a course of palliative radiotherapy to the T9 vertebral lesion by Dr. MLisbeth Renshawbut she continues to have persistent pain.  She is currently on pain medication by her primary care physician as well as Dr. MLisbeth Renshaw  She is on tramadol and Lyrica.  She denied having any current chest pain, shortness of breath, cough or hemoptysis.  She has no nausea, vomiting, diarrhea or constipation.  She has no headache or visual changes.  [facility] [place] and she would like to have her treatment scheduled around these times.  She molecular studies done by [facility] and that showed positive BRAF V600E mutation.  She also had PD-L1 expression of 35%.  She is here for evaluation and discussion of her treatment options.  MEDICAL HISTORY: Past Medical History:  Diagnosis Date   Anemia    as a child only   Coronary artery calcification of native artery    Hyperlipidemia    Hypertension    Liver spots    " per pt"   Lung nodule    Wears glasses    Wears partial dentures     ALLERGIES:  is allergic to crab [shellfish allergy] and morphine and related.  MEDICATIONS:  Current Outpatient Medications  Medication Sig Dispense Refill   fluconazole (DIFLUCAN) 100 MG tablet Take 1 tablet (100 mg total) by mouth daily. Take 2 tabs x 1, then 1 tab QD x 9 additional days. 11 tablet 0   amLODipine (NORVASC) 5 MG tablet Take 1 tablet (5 mg total) by mouth every evening. (Patient taking differently: Take 5 mg by mouth at bedtime.) 90 tablet 0   aspirin EC 81 MG tablet Take 81 mg by mouth at bedtime. Swallow whole.     atorvastatin (LIPITOR) 20 MG tablet Take 1 tablet (20 mg total) by mouth at bedtime. 90 tablet 0   calcium carbonate (OSCAL) 1500 (600 Ca) MG TABS tablet Take 600 mg of elemental calcium by mouth daily. When able to remember     clobetasol ointment (TEMOVATE) 0.05 % Apply  1 application  topically as needed (rash).     dexamethasone (DECADRON) 4 MG tablet Take 1 tablet (4 mg total) by mouth 3 (three) times daily. 90 tablet 1   gabapentin (NEURONTIN) 600 MG tablet Take 600 mg by mouth 3 (three) times daily.     lisinopril (ZESTRIL) 20 MG tablet Take 20 mg by mouth in the morning.     MAGNESIUM PO Take 1 tablet by mouth daily. When able to remember     nystatin-triamcinolone ointment (MYCOLOG) Apply 1 application  topically daily.     oxybutynin  (DITROPAN) 5 MG tablet Take 5 mg by mouth as needed for bladder spasms.     potassium chloride SA (KLOR-CON M) 20 MEQ tablet Take by mouth daily. Take 1 tablet a day with Calcium and Magnesium when she can remember for leg cramps     tiZANidine (ZANAFLEX) 2 MG tablet Take 2 mg by mouth at bedtime.     traMADol (ULTRAM) 50 MG tablet Take 50 mg by mouth every 6 (six) hours as needed for severe pain. Every 8 hours     No current facility-administered medications for this visit.    SURGICAL HISTORY:  Past Surgical History:  Procedure Laterality Date   ABDOMINAL HYSTERECTOMY  1974   heavy bleeding   ABDOMINAL HYSTERECTOMY     BRONCHIAL BRUSHINGS  08/13/2019   Procedure: BRONCHIAL BRUSHINGS;  Surgeon: Garner Nash, DO;  Location: [facility];  Service: Pulmonary;;   BRONCHIAL WASHINGS  08/13/2019   Procedure: BRONCHIAL WASHINGS;  Surgeon: Garner Nash, DO;  Location: [facility];  Service: Pulmonary;;   COLONOSCOPY W/ BIOPSIES AND POLYPECTOMY     DILATION AND CURETTAGE OF UTERUS     FINE NEEDLE ASPIRATION  08/13/2019   Procedure: FINE NEEDLE ASPIRATION (FNA) LINEAR;  Surgeon: Garner Nash, DO;  Location: [facility];  Service: Pulmonary;;   FOOT SURGERY     bilateral bunions and hammer toes   LUNG BIOPSY  08/13/2019   Procedure: LUNG BIOPSY;  Surgeon: Garner Nash, DO;  Location: [facility];  Service: Pulmonary;;   MULTIPLE TOOTH EXTRACTIONS     VIDEO BRONCHOSCOPY WITH ENDOBRONCHIAL NAVIGATION Right 08/13/2019   Procedure: VIDEO BRONCHOSCOPY WITH ENDOBRONCHIAL NAVIGATION;  Surgeon: Garner Nash, DO;  Location: [facility];  Service: Pulmonary;  Laterality: Right;   VIDEO BRONCHOSCOPY WITH ENDOBRONCHIAL ULTRASOUND Right 08/13/2019   Procedure: VIDEO BRONCHOSCOPY WITH ENDOBRONCHIAL ULTRASOUND;  Surgeon: Garner Nash, DO;  Location: [facility];  Service: Pulmonary;  Laterality: Right;    REVIEW OF SYSTEMS:  Constitutional: positive for fatigue Eyes: negative Ears,  nose, mouth, throat, and face: negative Respiratory: negative Cardiovascular: negative Gastrointestinal: negative Genitourinary:negative Integument/breast: negative Hematologic/lymphatic: negative Musculoskeletal:positive for back pain Neurological: negative Behavioral/Psych: negative Endocrine: negative Allergic/Immunologic: negative   PHYSICAL EXAMINATION: General appearance: alert, cooperative, fatigued, and no distress Head: Normocephalic, without obvious abnormality, atraumatic Neck: no adenopathy, no JVD, supple, symmetrical, trachea midline, and thyroid not enlarged, symmetric, no tenderness/mass/nodules Lymph nodes: Cervical, supraclavicular, and axillary nodes normal. Resp: clear to auscultation bilaterally Back: symmetric, no curvature. ROM normal. No CVA tenderness. Cardio: regular rate and rhythm, S1, S2 normal, no murmur, click, rub or gallop GI: soft, non-tender; bowel sounds normal; no masses,  no organomegaly Extremities: extremities normal, atraumatic, no cyanosis or edema Neurologic: Alert and oriented X 3, normal strength and tone. Normal symmetric reflexes. Normal coordination and gait  ECOG PERFORMANCE STATUS: 1 - Symptomatic but completely ambulatory  Blood pressure (!) 87/56, pulse 98, temperature 98.1  F (36.7 C), temperature source Oral, resp. rate 18, weight 167 lb 9.6 oz (76 kg), SpO2 96 %.  LABORATORY DATA: Lab Results  Component Value Date   WBC 7.2 04/05/2022   HGB 12.7 04/05/2022   HCT 39.4 04/05/2022   MCV 90.8 04/05/2022   PLT 265 04/05/2022      Chemistry      Component Value Date/Time   NA 137 03/13/2022 0631   NA 142 07/22/2019 1522   K 5.0 03/13/2022 0631   CL 102 03/13/2022 0631   CO2 26 03/13/2022 0631   BUN 27 (H) 03/13/2022 0631   BUN 13 07/22/2019 1522   CREATININE 0.85 03/13/2022 0631   CREATININE 0.96 02/14/2021 1528   CREATININE 0.91 09/16/2011 1000      Component Value Date/Time   CALCIUM 9.5 03/13/2022 0631    ALKPHOS 117 03/13/2022 0631   AST 15 03/13/2022 0631   AST 21 02/14/2021 1528   ALT 15 03/13/2022 0631   ALT 16 02/14/2021 1528   BILITOT 0.8 03/13/2022 0631   BILITOT 0.7 02/14/2021 1528       RADIOGRAPHIC STUDIES: NM PET Image Restage (PS) Skull Base to Thigh (F-18 FDG)  Result Date: 04/05/2022 CLINICAL DATA:  Subsequent treatment strategy for non-small cell lung cancer with bone metastases. EXAM: NUCLEAR MEDICINE PET SKULL BASE TO THIGH TECHNIQUE: 8.2 mCi F-18 FDG was injected intravenously. Full-ring PET imaging was performed from the skull base to thigh after the radiotracer. CT data was obtained and used for attenuation correction and anatomic localization. Fasting blood glucose: 83 mg/dl COMPARISON:  08/12/2019 FINDINGS: Mediastinal blood pool activity: SUV max 2.7 Liver activity: SUV max NA NECK: No hypermetabolic lymph nodes in the neck. Incidental CT findings: None. CHEST: No hypermetabolic mediastinal or hilar nodes. No suspicious pulmonary nodules on the CT scan. Low level uptake in the distal esophagus is likely physiologic although esophagitis could also have this appearance. Incidental CT findings: Coronary artery calcification is evident. Mild atherosclerotic calcification noted in the thoracic aorta. Previously described 3 mm left upper lobe pulmonary nodule is stable on image 43/4. Post treatment scarring noted right lung. ABDOMEN/PELVIS: No abnormal hypermetabolic activity within the liver, pancreas, adrenal glands, or spleen. No hypermetabolic lymph nodes in the abdomen or pelvis. Focal hypermetabolism is identified in the descending duodenum near the ampulla and a tiny diverticulum. There is no discernible mass lesion in this region nor lymphadenopathy on noncontrast CT imaging. Uptake in the duodenum is at upper portion to small bowel uptake seen elsewhere in the abdomen and pelvis. Incidental CT findings: There is mild atherosclerotic calcification of the abdominal aorta without  aneurysm. SKELETON: The patient's known T9 metastatic lesion is markedly hypermetabolic with SUV max = 8.6. Low level hypermetabolism is identified in the posterior aspect of the L3 vertebral body corresponding to a 14 mm sclerotic lesion visible on image 114/4. There is hypermetabolism of all vein the posterior elements bilaterally at L4 with SUV max = 8.7. Mottled hypermetabolism is seen in the L5 vertebral body with dense sclerosis visible on CT imaging. 8 mm sclerotic lesion in the anterior left iliac crest (136/4) demonstrates SUV max = 3. Incidental CT findings: None. IMPRESSION: 1. Hypermetabolic bony metastases identified at T9, L3, L4, and L5. 2. No evidence for hypermetabolic soft tissue metastases in the neck, chest, abdomen, or pelvis. 3. Focal hypermetabolism identified in the descending duodenum in the region of the duodenal diverticulum. Uptake may be related to infection/inflammation (duodenitis). No mass lesion evident on noncontrast CT  imaging. Consider CT abdomen/pelvis with contrast to further evaluate as clinically warranted. Upper endoscopy could be used to exclude mucosal lesion as clinically warranted. 4. Low level uptake in the mid-distal esophagus may be physiologic or related to underlying esophagitis. No wall thickening in this region by CT imaging. 5.  Aortic Atherosclerosis (ICD10-I70.0). Electronically Signed   By: Misty Stanley M.D.   On: 04/05/2022 14:33   CT BONE TROCAR/NEEDLE BIOPSY DEEP  Result Date: 03/11/2022 CLINICAL DATA:  Metastatic disease of the spine involving thoracic and lumbar vertebral bodies with history of lung carcinoma. The most significant vertebral lesion is at the T9 level and also involving posterior elements of T9 including the right-sided transverse process and costovertebral junction. EXAM: CT GUIDED CORE BIOPSY OF BODY PART ANESTHESIA/SEDATION: Moderate (conscious) sedation was employed during this procedure. A total of Versed 2.0 mg and Fentanyl 100  mcg was administered intravenously by radiology nursing. Moderate Sedation Time: 17 minutes. The patient's level of consciousness and vital signs were monitored continuously by radiology nursing throughout the procedure under my direct supervision. PROCEDURE: The procedure risks, benefits, and alternatives were explained to the patient. Questions regarding the procedure were encouraged and answered. The patient understands and consents to the procedure. A time-out was performed prior to initiating the procedure. CT was performed through the mid to lower thoracic region in a prone position. The posterior thoracic region was prepped with chlorhexidine in a sterile fashion, and a sterile drape was applied covering the operative field. A sterile gown and sterile gloves were used for the procedure. Local anesthesia was provided with 1% Lidocaine. Under CT guidance, a 17 gauge trocar needle was advanced to the level of a lesion involving the right-sided transverse process and costovertebral junction of the T9 vertebral body. After confirming needle tip position, 3 separate core biopsy samples were obtained with an 18 gauge biopsy device. Samples were submitted in formalin. Additional CT was performed after outer needle removal. RADIATION DOSE REDUCTION: This exam was performed according to the departmental dose-optimization program which includes automated exposure control, adjustment of the mA and/or kV according to patient size and/or use of iterative reconstruction technique. COMPLICATIONS: None FINDINGS: Tumor involving the T9 vertebral body at the level of right-sided posterior elements was targeted. Fragments of solid tissue were obtained. IMPRESSION: CT-guided core biopsy performed of tumor involving the T9 vertebral body at the level of the right-sided transverse process and right costovertebral junction. Electronically Signed   By: Aletta Edouard M.D.   On: 03/11/2022 14:34     ASSESSMENT AND PLAN: This is a  pleasant 71 years old African-American female diagnosed with metastatic also lung cancer, adenocarcinoma initially diagnosed as stage IIb (T1b, N1, M0) non-small cell lung cancer, adenocarcinoma status post a right upper lobectomy with lymph node dissection on September 30, 2019 in [place].  She declined adjuvant systemic chemotherapy at that time. She was found to have evidence for disease metastasis in November 2023 presenting with multiple metastatic bone lesions. The patient had molecular studies that showed an actionable mutation with BRAF V600E mutation and PD-L1 expression of 35%. She underwent palliative radiotherapy to the T9 vertebral lesion under the care of Dr. Lisbeth Renshaw. I had a lengthy discussion with the patient today about her current disease condition and treatment options. The patient understands that she has incurable condition and all the treatment will be of palliative nature.  She was given the option of palliative care versus palliative treatment with targeted therapy with dabrafenib and trametinib versus first-line combination  of systemic chemotherapy with carboplatin for AUC of 5, Alimta 500 Mg/M2 and Keytruda 200 Mg IV every 3 weeks.  The patient is concerned about the cost of the targeted therapy as well as the number of tablets and adverse effects she is interested in proceeding with the systemic chemotherapy in combination with immunotherapy every 3 weeks.  I discussed with her the adverse effect of this treatment including but not limited to alopecia, myelosuppression, nausea and vomiting, peripheral neuropathy, liver or renal dysfunction as well as the adverse effect of the immunotherapy. She is expected to start the first cycle of this treatment on April 16, 2022. The patient will have a chemotherapy education class before the first dose of her treatment. She will receive vitamin B12 injection today. I will call her pharmacy with prescription for Compazine 10 mg p.o. every 6 hours as  needed for nausea in addition to folic acid 1 mg p.o. daily. For pain management she will continue her current treatment with tramadol and Lyrica.  I will also refer the patient to interventional radiology for evaluation and consideration of treatment to his osteocool or vertebroplasty if needed. The patient will come back for follow-up visit in 2 weeks for evaluation and management of any adverse effect of her treatment. She was advised to call immediately if she has any other concerning symptoms in the interval. The patient was advised to call immediately if she has any other concerning symptoms in the interval. The patient voices understanding of current disease status and treatment options and is in agreement with the current care plan. The total time spent in the appointment was 55 minutes.  All questions were answered. The patient knows to call the clinic with any problems, questions or concerns. We can certainly see the patient much sooner if necessary.  Disclaimer: This note was dictated with voice recognition software. Similar sounding words can inadvertently be transcribed and may not be corrected upon review.

## 2022-04-11 ENCOUNTER — Other Ambulatory Visit: Payer: Self-pay

## 2022-04-11 ENCOUNTER — Other Ambulatory Visit: Payer: Self-pay | Admitting: Physician Assistant

## 2022-04-11 ENCOUNTER — Telehealth: Payer: Self-pay | Admitting: Pulmonary Disease

## 2022-04-11 DIAGNOSIS — C7951 Secondary malignant neoplasm of bone: Secondary | ICD-10-CM

## 2022-04-11 NOTE — Telephone Encounter (Signed)
Spoke to pt and she stated she got a message from [employer], NP a few days ago about an appt. to go over her test results. I didn't see any telephone message from TP, NP. Pt also stated that she has seen Dr Earlie Server and he went over her test results and they've come up with a course of treatment plan for pt. I informed her that she would need to follow up with Dr Valeta Harms when needed in 6-8 months. Pt verbalized understanding nothing further needed.

## 2022-04-12 ENCOUNTER — Telehealth: Payer: Self-pay | Admitting: Internal Medicine

## 2022-04-12 NOTE — Telephone Encounter (Signed)
Scheduled per 12/27 los and work-que, patient has been called and notified of upcoming appointments.

## 2022-04-15 ENCOUNTER — Other Ambulatory Visit: Payer: Self-pay

## 2022-04-16 ENCOUNTER — Other Ambulatory Visit: Payer: Self-pay

## 2022-04-16 ENCOUNTER — Inpatient Hospital Stay: Payer: Medicare HMO

## 2022-04-16 ENCOUNTER — Inpatient Hospital Stay: Payer: Medicare HMO | Attending: Physician Assistant

## 2022-04-16 DIAGNOSIS — Z5112 Encounter for antineoplastic immunotherapy: Secondary | ICD-10-CM | POA: Diagnosis not present

## 2022-04-16 DIAGNOSIS — C7951 Secondary malignant neoplasm of bone: Secondary | ICD-10-CM | POA: Diagnosis not present

## 2022-04-16 DIAGNOSIS — Z5111 Encounter for antineoplastic chemotherapy: Secondary | ICD-10-CM | POA: Diagnosis not present

## 2022-04-16 DIAGNOSIS — I10 Essential (primary) hypertension: Secondary | ICD-10-CM | POA: Insufficient documentation

## 2022-04-16 DIAGNOSIS — C3411 Malignant neoplasm of upper lobe, right bronchus or lung: Secondary | ICD-10-CM | POA: Insufficient documentation

## 2022-04-16 DIAGNOSIS — Z90710 Acquired absence of both cervix and uterus: Secondary | ICD-10-CM | POA: Diagnosis not present

## 2022-04-16 DIAGNOSIS — Z79899 Other long term (current) drug therapy: Secondary | ICD-10-CM | POA: Insufficient documentation

## 2022-04-16 LAB — CMP (CANCER CENTER ONLY)
ALT: 20 U/L (ref 0–44)
AST: 27 U/L (ref 15–41)
Albumin: 3.1 g/dL — ABNORMAL LOW (ref 3.5–5.0)
Alkaline Phosphatase: 87 U/L (ref 38–126)
Anion gap: 8 (ref 5–15)
BUN: 14 mg/dL (ref 8–23)
CO2: 25 mmol/L (ref 22–32)
Calcium: 8.9 mg/dL (ref 8.9–10.3)
Chloride: 102 mmol/L (ref 98–111)
Creatinine: 0.98 mg/dL (ref 0.44–1.00)
GFR, Estimated: >60 mL/min (ref >60)
Glucose, Bld: 89 mg/dL (ref 70–99)
Potassium: 4 mmol/L (ref 3.5–5.1)
Sodium: 135 mmol/L (ref 135–145)
Total Bilirubin: 0.5 mg/dL (ref 0.3–1.2)
Total Protein: 6.5 g/dL (ref 6.5–8.1)

## 2022-04-16 LAB — CBC WITH DIFFERENTIAL (CANCER CENTER ONLY)
Abs Immature Granulocytes: 0.03 10*3/uL (ref 0.00–0.07)
Basophils Absolute: 0.1 10*3/uL (ref 0.0–0.1)
Basophils Relative: 1 %
Eosinophils Absolute: 0.1 10*3/uL (ref 0.0–0.5)
Eosinophils Relative: 2 %
HCT: 34.9 % — ABNORMAL LOW (ref 36.0–46.0)
Hemoglobin: 11.1 g/dL — ABNORMAL LOW (ref 12.0–15.0)
Immature Granulocytes: 0 %
Lymphocytes Relative: 16 %
Lymphs Abs: 1.1 10*3/uL (ref 0.7–4.0)
MCH: 29 pg (ref 26.0–34.0)
MCHC: 31.8 g/dL (ref 30.0–36.0)
MCV: 91.1 fL (ref 80.0–100.0)
Monocytes Absolute: 0.7 10*3/uL (ref 0.1–1.0)
Monocytes Relative: 9 %
Neutro Abs: 5.1 10*3/uL (ref 1.7–7.7)
Neutrophils Relative %: 72 %
Platelet Count: 381 10*3/uL (ref 150–400)
RBC: 3.83 MIL/uL — ABNORMAL LOW (ref 3.87–5.11)
RDW: 12.7 % (ref 11.5–15.5)
WBC Count: 7 10*3/uL (ref 4.0–10.5)
nRBC: 0 % (ref 0.0–0.2)

## 2022-04-16 NOTE — Progress Notes (Signed)
Spoke to pt and she stated she got a message from [employer], NP a few days ago about an appt. to go over her test results. I didn't see any telephone message from TP, NP. Pt also stated that she has seen Dr Earlie Server and he went over her test results and they've come up with a course of treatment plan for pt. I informed her that she would need to follow up with Dr Valeta Harms when needed in 6-8 months. Pt verbalized understanding nothing further needed.    Electronically signed by Alvin Critchley, [place] at 04/11/2022  9:13 AM

## 2022-04-17 LAB — T4: T4, Total: 5.8 ug/dL (ref 4.5–12.0)

## 2022-04-17 LAB — TSH: TSH: 0.242 u[IU]/mL — ABNORMAL LOW (ref 0.350–4.500)

## 2022-04-17 MED FILL — Dexamethasone Sodium Phosphate Inj 100 MG/10ML: INTRAMUSCULAR | Qty: 1 | Status: AC

## 2022-04-17 MED FILL — Fosaprepitant Dimeglumine For IV Infusion 150 MG (Base Eq): INTRAVENOUS | Qty: 5 | Status: AC

## 2022-04-18 ENCOUNTER — Inpatient Hospital Stay: Payer: Medicare HMO

## 2022-04-18 ENCOUNTER — Other Ambulatory Visit: Payer: Self-pay | Admitting: Internal Medicine

## 2022-04-18 ENCOUNTER — Other Ambulatory Visit: Payer: Self-pay

## 2022-04-18 ENCOUNTER — Other Ambulatory Visit: Payer: Self-pay | Admitting: Medical Oncology

## 2022-04-18 VITALS — BP 101/60 | HR 82 | Temp 98.5°F | Resp 18 | Wt 168.1 lb

## 2022-04-18 DIAGNOSIS — Z5111 Encounter for antineoplastic chemotherapy: Secondary | ICD-10-CM | POA: Diagnosis not present

## 2022-04-18 DIAGNOSIS — C7951 Secondary malignant neoplasm of bone: Secondary | ICD-10-CM

## 2022-04-18 DIAGNOSIS — I878 Other specified disorders of veins: Secondary | ICD-10-CM

## 2022-04-18 MED ORDER — SODIUM CHLORIDE 0.9 % IV SOLN: Freq: Once | INTRAVENOUS | Status: AC

## 2022-04-18 MED ORDER — PALONOSETRON HCL INJECTION 0.25 MG/5ML
0.2500 mg | Freq: Once | INTRAVENOUS | Status: AC
  Administered 2022-04-18: 0.25 mg via INTRAVENOUS
  Filled 2022-04-18: qty 5

## 2022-04-18 MED ORDER — SODIUM CHLORIDE 0.9 % IV SOLN
10.0000 mg | Freq: Once | INTRAVENOUS | Status: AC
  Administered 2022-04-18: 10 mg via INTRAVENOUS
  Filled 2022-04-18: qty 10

## 2022-04-18 MED ORDER — SODIUM CHLORIDE 0.9 % IV SOLN
500.0000 mg/m2 | Freq: Once | INTRAVENOUS | Status: AC
  Administered 2022-04-18: 900 mg via INTRAVENOUS
  Filled 2022-04-18: qty 20

## 2022-04-18 MED ORDER — SODIUM CHLORIDE 0.9 % IV SOLN
200.0000 mg | Freq: Once | INTRAVENOUS | Status: AC
  Administered 2022-04-18: 200 mg via INTRAVENOUS
  Filled 2022-04-18: qty 200

## 2022-04-18 MED ORDER — SODIUM CHLORIDE 0.9 % IV SOLN
425.5000 mg | Freq: Once | INTRAVENOUS | Status: AC
  Administered 2022-04-18: 430 mg via INTRAVENOUS
  Filled 2022-04-18: qty 43

## 2022-04-18 MED ORDER — SODIUM CHLORIDE 0.9 % IV SOLN
150.0000 mg | Freq: Once | INTRAVENOUS | Status: AC
  Administered 2022-04-18: 150 mg via INTRAVENOUS
  Filled 2022-04-18: qty 150

## 2022-04-18 NOTE — Patient Instructions (Signed)
[facility]  Discharge Instructions: Thank you for choosing [facility] to provide your oncology and hematology care.   If you have a lab appointment with the [facility], please go directly to the [facility] and check in at the registration area.   Wear comfortable clothing and clothing appropriate for easy access to any Portacath or PICC line.   We strive to give you quality time with your provider. You may need to reschedule your appointment if you arrive late (15 or more minutes).  Arriving late affects you and other patients whose appointments are after yours.  Also, if you miss three or more appointments without notifying the office, you may be dismissed from the clinic at the provider's discretion.      For prescription refill requests, have your pharmacy contact our office and allow 72 hours for refills to be completed.    Today you received the following chemotherapy and/or immunotherapy agents: Pembrolizumab (Keytruda), Pemetrexed (Alimta), Carboplatin (Paraplatin)      To help prevent nausea and vomiting after your treatment, we encourage you to take your nausea medication as directed.  BELOW ARE SYMPTOMS THAT SHOULD BE REPORTED IMMEDIATELY: *FEVER GREATER THAN 100.4 F (38 C) OR HIGHER *CHILLS OR SWEATING *NAUSEA AND VOMITING THAT IS NOT CONTROLLED WITH YOUR NAUSEA MEDICATION *UNUSUAL SHORTNESS OF BREATH *UNUSUAL BRUISING OR BLEEDING *URINARY PROBLEMS (pain or burning when urinating, or frequent urination) *BOWEL PROBLEMS (unusual diarrhea, constipation, pain near the anus) TENDERNESS IN MOUTH AND THROAT WITH OR WITHOUT PRESENCE OF ULCERS (sore throat, sores in mouth, or a toothache) UNUSUAL RASH, SWELLING OR PAIN  UNUSUAL VAGINAL DISCHARGE OR ITCHING   Items with * indicate a potential emergency and should be followed up as soon as possible or go to the Emergency Department if any problems should occur.  Please show the  CHEMOTHERAPY ALERT CARD or IMMUNOTHERAPY ALERT CARD at check-in to the Emergency Department and triage nurse.  Should you have questions after your visit or need to cancel or reschedule your appointment, please contact [facility]  Dept: [phone]  and follow the prompts.  Office hours are 8:00 a.m. to 4:30 p.m. Monday - Friday. Please note that voicemails left after 4:00 p.m. may not be returned until the following business day.  We are closed weekends and major holidays. You have access to a nurse at all times for urgent questions. Please call the main number to the clinic Dept: [phone] and follow the prompts.   For any non-urgent questions, you may also contact your provider using MyChart. We now offer e-Visits for anyone 57 and older to request care online for non-urgent symptoms. For details visit [email or web address].   Also download the MyChart app! Go to the app store, search "MyChart", open the app, select [facility], and log in with your MyChart username and password.

## 2022-04-18 NOTE — Progress Notes (Signed)
Pt c/o pain of PIV site. This RN stopped infusion and assessed site. Upon assessment this RN discovered no blood return, but was able to flush the PIV with no pain noted from Pt. No discoloration or swelling was present at the time of assessment. This RN had [place] RN assess PIV site, PIV flushed, but blood return was noted. D/t lack of blood return and discomfort at site during infusion, PIV was discontinued and new PIV was obtained. See flowsheets for details.

## 2022-04-19 ENCOUNTER — Telehealth: Payer: Self-pay

## 2022-04-19 NOTE — Telephone Encounter (Signed)
-----   Message from Willis Modena, RN sent at 04/18/2022  2:11 PM EST ----- Regarding: Dr. Julien Nordmann 1st time Keytruda/Alimta/Carbo f/u tol well Dr. Julien Nordmann 1st time Keytruda/Alimta/Carbo. Pt call back due, Pt tolerated tx well without incident. Could you please discuss with this Pt a PAC placement as she was stuck multiple times with discomfort to obtain site for infusion.

## 2022-04-19 NOTE — Telephone Encounter (Signed)
Brandi Holmes states that she is doing fine. She is eating, drinking, and urinating well. She knows to call the office at [phone]. She declined a port -a -cath at this time. She wants to try one more time with peripheral veins.

## 2022-04-19 NOTE — Telephone Encounter (Signed)
Tiffany in IR called to say that patient declined port. Tiffany spent 15 minutes educating pt on benefits of PAC.

## 2022-04-24 ENCOUNTER — Inpatient Hospital Stay (HOSPITAL_BASED_OUTPATIENT_CLINIC_OR_DEPARTMENT_OTHER): Payer: Medicare HMO | Admitting: Internal Medicine

## 2022-04-24 ENCOUNTER — Inpatient Hospital Stay: Payer: Medicare HMO

## 2022-04-24 VITALS — BP 102/64 | Temp 98.4°F | Resp 18 | Wt 166.4 lb

## 2022-04-24 DIAGNOSIS — C7951 Secondary malignant neoplasm of bone: Secondary | ICD-10-CM

## 2022-04-24 DIAGNOSIS — C3411 Malignant neoplasm of upper lobe, right bronchus or lung: Secondary | ICD-10-CM

## 2022-04-24 DIAGNOSIS — Z5111 Encounter for antineoplastic chemotherapy: Secondary | ICD-10-CM | POA: Diagnosis not present

## 2022-04-24 LAB — CMP (CANCER CENTER ONLY)
ALT: 23 U/L (ref 0–44)
AST: 26 U/L (ref 15–41)
Albumin: 3 g/dL — ABNORMAL LOW (ref 3.5–5.0)
Alkaline Phosphatase: 72 U/L (ref 38–126)
Anion gap: 7 (ref 5–15)
BUN: 9 mg/dL (ref 8–23)
CO2: 24 mmol/L (ref 22–32)
Calcium: 8.5 mg/dL — ABNORMAL LOW (ref 8.9–10.3)
Chloride: 107 mmol/L (ref 98–111)
Creatinine: 0.75 mg/dL (ref 0.44–1.00)
GFR, Estimated: >60 mL/min (ref >60)
Glucose, Bld: 91 mg/dL (ref 70–99)
Potassium: 4.1 mmol/L (ref 3.5–5.1)
Sodium: 138 mmol/L (ref 135–145)
Total Bilirubin: 0.6 mg/dL (ref 0.3–1.2)
Total Protein: 6 g/dL — ABNORMAL LOW (ref 6.5–8.1)

## 2022-04-24 LAB — CBC WITH DIFFERENTIAL/PLATELET
Abs Immature Granulocytes: 0.01 10*3/uL (ref 0.00–0.07)
Basophils Absolute: 0 10*3/uL (ref 0.0–0.1)
Basophils Relative: 0 %
Eosinophils Absolute: 0.1 10*3/uL (ref 0.0–0.5)
Eosinophils Relative: 3 %
HCT: 33.4 % — ABNORMAL LOW (ref 36.0–46.0)
Hemoglobin: 10.7 g/dL — ABNORMAL LOW (ref 12.0–15.0)
Immature Granulocytes: 0 %
Lymphocytes Relative: 15 %
Lymphs Abs: 0.5 10*3/uL — ABNORMAL LOW (ref 0.7–4.0)
MCH: 28.9 pg (ref 26.0–34.0)
MCHC: 32 g/dL (ref 30.0–36.0)
MCV: 90.3 fL (ref 80.0–100.0)
Monocytes Absolute: 0.1 10*3/uL (ref 0.1–1.0)
Monocytes Relative: 3 %
Neutro Abs: 2.7 10*3/uL (ref 1.7–7.7)
Neutrophils Relative %: 79 %
Platelets: 303 10*3/uL (ref 150–400)
RBC: 3.7 MIL/uL — ABNORMAL LOW (ref 3.87–5.11)
RDW: 12.7 % (ref 11.5–15.5)
Smear Review: NORMAL
WBC: 3.5 10*3/uL — ABNORMAL LOW (ref 4.0–10.5)
nRBC: 0 % (ref 0.0–0.2)

## 2022-04-24 NOTE — Progress Notes (Signed)
[place] [phone]   [phone]  OFFICE PROGRESS NOTE  [address], [place] [address]  DIAGNOSIS: Metastatic non-small cell lung cancer, adenocarcinoma that was initially diagnosed as stage IIb (T1b, N1, M0) non-small cell lung cancer, adenocarcinoma presented with right upper lobe lung nodule  Detected Alteration(s) / Biomarker(s) Associated FDA-approved therapies Clinical Trial Availability % cfDNA or Amplification BRAF V600E approved by FDA Dabrafenib+trametinib, Encorafenib+binimetinib approved in other indication Cobimetinib, Dabrafenib, Trametinib, Vemurafenib, Vemurafenib+cobimetinib Yes 8.3%  BRCA1 R252S None (VUS) None (VUS) 0.1%   SMAD4 D443fs None None 7.2%  PD-L1 expression 35%  PRIOR THERAPY:  1) Status post right upper lobectomy with lymph node dissection on September 30, 2019 in [place].  She declines adjuvant systemic chemotherapy. 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 Lisbeth Renshaw.  CURRENT THERAPY: Systemic chemotherapy with carboplatin for AUC of 5, Alimta 500 Mg/M2 and Keytruda 200 Mg IV every 3 weeks.  First dose April 16, 2022.  Status post 1 cycle.  INTERVAL HISTORY: Brandi Holmes 72 y.o. female returns to the clinic today for follow-up visit.  The patient started the first cycle of her systemic chemotherapy with carboplatin, Alimta and Keytruda last week.  She tolerated the first week of her treatment well with no concerning adverse effects.  She denied having any significant nausea or vomiting, diarrhea or constipation.  She has no chest pain, shortness of breath, cough or hemoptysis.  She continues to have mid back pain with radiation to the right side.  She has no recent weight loss or night sweats.  She is here today for evaluation and repeat blood work.  MEDICAL  HISTORY: Past Medical History:  Diagnosis Date   Anemia    as a child only   Coronary artery calcification of native artery    Hyperlipidemia    Hypertension    Liver spots    " per pt"   Lung nodule    Wears glasses    Wears partial dentures     ALLERGIES:  is allergic to crab [shellfish allergy] and morphine and related.  MEDICATIONS:  Current Outpatient Medications  Medication Sig Dispense Refill   amLODipine (NORVASC) 5 MG tablet Take 1 tablet (5 mg total) by mouth every evening. (Patient taking differently: Take 5 mg by mouth at bedtime.) 90 tablet 0   aspirin EC 81 MG tablet Take 81 mg by mouth at bedtime. Swallow whole.     atorvastatin (LIPITOR) 20 MG tablet Take 1 tablet (20 mg total) by mouth at bedtime. 90 tablet 0   calcium carbonate (OSCAL) 1500 (600 Ca) MG TABS tablet Take 600 mg of elemental calcium by mouth daily. When able to remember     clobetasol ointment (TEMOVATE) 2.77 % Apply 1 application  topically as needed (rash).     dexamethasone (DECADRON) 4 MG tablet Take 1 tablet (4 mg total) by mouth 3 (three) times daily. 90 tablet 1   fluconazole (DIFLUCAN) 100 MG tablet Take 1 tablet (100 mg total) by mouth daily. Take 2 tabs x 1, then 1 tab QD x 9 additional days. 11 tablet 0   folic acid (FOLVITE) 1 MG tablet Take 1 tablet (1 mg total) by mouth daily. 30 tablet 4   gabapentin (NEURONTIN) 600 MG tablet Take 600 mg by mouth 3 (three) times daily.  lisinopril (ZESTRIL) 20 MG tablet Take 20 mg by mouth in the morning.     MAGNESIUM PO Take 1 tablet by mouth daily. When able to remember     nystatin-triamcinolone ointment (MYCOLOG) Apply 1 application  topically daily.     oxybutynin (DITROPAN) 5 MG tablet Take 5 mg by mouth as needed for bladder spasms.     potassium chloride SA (KLOR-CON M) 20 MEQ tablet Take by mouth daily. Take 1 tablet a day with Calcium and Magnesium when she can remember for leg cramps     prochlorperazine (COMPAZINE) 10 MG tablet Take 1  tablet (10 mg total) by mouth every 6 (six) hours as needed for nausea or vomiting. 30 tablet 0   tiZANidine (ZANAFLEX) 2 MG tablet Take 2 mg by mouth at bedtime.     traMADol (ULTRAM) 50 MG tablet Take 50 mg by mouth every 6 (six) hours as needed for severe pain. Every 8 hours     No current facility-administered medications for this visit.    SURGICAL HISTORY:  Past Surgical History:  Procedure Laterality Date   ABDOMINAL HYSTERECTOMY  1974   heavy bleeding   ABDOMINAL HYSTERECTOMY     BRONCHIAL BRUSHINGS  08/13/2019   Procedure: BRONCHIAL BRUSHINGS;  Surgeon: Garner Nash, DO;  Location: [facility];  Service: Pulmonary;;   BRONCHIAL WASHINGS  08/13/2019   Procedure: BRONCHIAL WASHINGS;  Surgeon: Garner Nash, DO;  Location: [facility];  Service: Pulmonary;;   COLONOSCOPY W/ BIOPSIES AND POLYPECTOMY     DILATION AND CURETTAGE OF UTERUS     FINE NEEDLE ASPIRATION  08/13/2019   Procedure: FINE NEEDLE ASPIRATION (FNA) LINEAR;  Surgeon: Garner Nash, DO;  Location: [facility];  Service: Pulmonary;;   FOOT SURGERY     bilateral bunions and hammer toes   LUNG BIOPSY  08/13/2019   Procedure: LUNG BIOPSY;  Surgeon: Garner Nash, DO;  Location: [facility];  Service: Pulmonary;;   MULTIPLE TOOTH EXTRACTIONS     VIDEO BRONCHOSCOPY WITH ENDOBRONCHIAL NAVIGATION Right 08/13/2019   Procedure: VIDEO BRONCHOSCOPY WITH ENDOBRONCHIAL NAVIGATION;  Surgeon: Garner Nash, DO;  Location: [facility];  Service: Pulmonary;  Laterality: Right;   VIDEO BRONCHOSCOPY WITH ENDOBRONCHIAL ULTRASOUND Right 08/13/2019   Procedure: VIDEO BRONCHOSCOPY WITH ENDOBRONCHIAL ULTRASOUND;  Surgeon: Garner Nash, DO;  Location: [facility];  Service: Pulmonary;  Laterality: Right;    REVIEW OF SYSTEMS:  A comprehensive review of systems was negative except for: Constitutional: positive for fatigue Musculoskeletal: positive for back pain   PHYSICAL EXAMINATION: General appearance: alert,  cooperative, fatigued, and no distress Head: Normocephalic, without obvious abnormality, atraumatic Neck: no adenopathy, no JVD, supple, symmetrical, trachea midline, and thyroid not enlarged, symmetric, no tenderness/mass/nodules Lymph nodes: Cervical, supraclavicular, and axillary nodes normal. Resp: clear to auscultation bilaterally Back: symmetric, no curvature. ROM normal. No CVA tenderness. Cardio: regular rate and rhythm, S1, S2 normal, no murmur, click, rub or gallop GI: soft, non-tender; bowel sounds normal; no masses,  no organomegaly Extremities: extremities normal, atraumatic, no cyanosis or edema Neurologic: Alert and oriented X 3, normal strength and tone. Normal symmetric reflexes. Normal coordination and gait  ECOG PERFORMANCE STATUS: 1 - Symptomatic but completely ambulatory  Blood pressure 102/64, temperature 98.4 F (36.9 C), resp. rate 18, weight 166 lb 7 oz (75.5 kg).  LABORATORY DATA: Lab Results  Component Value Date   WBC 3.5 (L) 04/24/2022   HGB 10.7 (L) 04/24/2022   HCT 33.4 (L) 04/24/2022   MCV 90.3  04/24/2022   PLT 303 04/24/2022      Chemistry      Component Value Date/Time   NA 138 04/24/2022 1531   NA 142 07/22/2019 1522   K 4.1 04/24/2022 1531   CL 107 04/24/2022 1531   CO2 24 04/24/2022 1531   BUN 9 04/24/2022 1531   BUN 13 07/22/2019 1522   CREATININE 0.75 04/24/2022 1531   CREATININE 0.91 09/16/2011 1000      Component Value Date/Time   CALCIUM 8.5 (L) 04/24/2022 1531   ALKPHOS 72 04/24/2022 1531   AST 26 04/24/2022 1531   ALT 23 04/24/2022 1531   BILITOT 0.6 04/24/2022 1531       RADIOGRAPHIC STUDIES: NM PET Image Restage (PS) Skull Base to Thigh (F-18 FDG)  Result Date: 04/05/2022 CLINICAL DATA:  Subsequent treatment strategy for non-small cell lung cancer with bone metastases. EXAM: NUCLEAR MEDICINE PET SKULL BASE TO THIGH TECHNIQUE: 8.2 mCi F-18 FDG was injected intravenously. Full-ring PET imaging was performed from the  skull base to thigh after the radiotracer. CT data was obtained and used for attenuation correction and anatomic localization. Fasting blood glucose: 83 mg/dl COMPARISON:  08/12/2019 FINDINGS: Mediastinal blood pool activity: SUV max 2.7 Liver activity: SUV max NA NECK: No hypermetabolic lymph nodes in the neck. Incidental CT findings: None. CHEST: No hypermetabolic mediastinal or hilar nodes. No suspicious pulmonary nodules on the CT scan. Low level uptake in the distal esophagus is likely physiologic although esophagitis could also have this appearance. Incidental CT findings: Coronary artery calcification is evident. Mild atherosclerotic calcification noted in the thoracic aorta. Previously described 3 mm left upper lobe pulmonary nodule is stable on image 43/4. Post treatment scarring noted right lung. ABDOMEN/PELVIS: No abnormal hypermetabolic activity within the liver, pancreas, adrenal glands, or spleen. No hypermetabolic lymph nodes in the abdomen or pelvis. Focal hypermetabolism is identified in the descending duodenum near the ampulla and a tiny diverticulum. There is no discernible mass lesion in this region nor lymphadenopathy on noncontrast CT imaging. Uptake in the duodenum is at upper portion to small bowel uptake seen elsewhere in the abdomen and pelvis. Incidental CT findings: There is mild atherosclerotic calcification of the abdominal aorta without aneurysm. SKELETON: The patient's known T9 metastatic lesion is markedly hypermetabolic with SUV max = 8.6. Low level hypermetabolism is identified in the posterior aspect of the L3 vertebral body corresponding to a 14 mm sclerotic lesion visible on image 114/4. There is hypermetabolism of all vein the posterior elements bilaterally at L4 with SUV max = 8.7. Mottled hypermetabolism is seen in the L5 vertebral body with dense sclerosis visible on CT imaging. 8 mm sclerotic lesion in the anterior left iliac crest (136/4) demonstrates SUV max = 3.  Incidental CT findings: None. IMPRESSION: 1. Hypermetabolic bony metastases identified at T9, L3, L4, and L5. 2. No evidence for hypermetabolic soft tissue metastases in the neck, chest, abdomen, or pelvis. 3. Focal hypermetabolism identified in the descending duodenum in the region of the duodenal diverticulum. Uptake may be related to infection/inflammation (duodenitis). No mass lesion evident on noncontrast CT imaging. Consider CT abdomen/pelvis with contrast to further evaluate as clinically warranted. Upper endoscopy could be used to exclude mucosal lesion as clinically warranted. 4. Low level uptake in the mid-distal esophagus may be physiologic or related to underlying esophagitis. No wall thickening in this region by CT imaging. 5.  Aortic Atherosclerosis (ICD10-I70.0). Electronically Signed   By: Misty Stanley M.D.   On: 04/05/2022 14:33  ASSESSMENT AND PLAN: This is a pleasant 72 years old African-American female diagnosed with metastatic also lung cancer, adenocarcinoma initially diagnosed as stage IIb (T1b, N1, M0) non-small cell lung cancer, adenocarcinoma status post a right upper lobectomy with lymph node dissection on September 30, 2019 in [place].  She declined adjuvant systemic chemotherapy at that time. She was found to have evidence for disease metastasis in November 2023 presenting with multiple metastatic bone lesions. The patient had molecular studies that showed an actionable mutation with BRAF V600E mutation and PD-L1 expression of 35%. She underwent palliative radiotherapy to the T9 vertebral lesion under the care of Dr. Lisbeth Renshaw. The patient is currently undergoing first-line combination of systemic chemotherapy with carboplatin for AUC of 5, Alimta 500 Mg/M2 and Keytruda 200 Mg IV every 3 weeks.  Status post 1 cycle.   She tolerated the first cycle of her treatment fairly well with no concerning adverse effects. I recommended for the patient to continue her treatment as planned and she  is expected to start cycle #2 in around 2 weeks.  She will come back for follow-up visit at that time. For the pain management she will continue her current treatment with tramadol and Lyrica and the patient was referred to IR for consideration of vertebroplasty. The patient was advised to call immediately if she has any other concerning symptoms in the interval. The patient voices understanding of current disease status and treatment options and is in agreement with the current care plan. The total time spent in the appointment was 20 minutes.  All questions were answered. The patient knows to call the clinic with any problems, questions or concerns. We can certainly see the patient much sooner if necessary.  Disclaimer: This note was dictated with voice recognition software. Similar sounding words can inadvertently be transcribed and may not be corrected upon review.

## 2022-04-29 ENCOUNTER — Telehealth: Payer: Self-pay | Admitting: Internal Medicine

## 2022-04-29 NOTE — Telephone Encounter (Signed)
Called patient regarding upcoming Jan-May appointments, patient is notified.

## 2022-05-08 ENCOUNTER — Ambulatory Visit: Payer: Medicare HMO

## 2022-05-08 ENCOUNTER — Ambulatory Visit: Payer: Medicare HMO | Admitting: Physician Assistant

## 2022-05-08 ENCOUNTER — Other Ambulatory Visit: Payer: Medicare HMO

## 2022-05-08 MED FILL — Fosaprepitant Dimeglumine For IV Infusion 150 MG (Base Eq): INTRAVENOUS | Qty: 5 | Status: AC

## 2022-05-08 MED FILL — Dexamethasone Sodium Phosphate Inj 100 MG/10ML: INTRAMUSCULAR | Qty: 1 | Status: AC

## 2022-05-08 NOTE — Progress Notes (Unsigned)
[place], [place], [place] [address]  DIAGNOSIS: Metastatic non-small cell lung cancer, adenocarcinoma that was initially diagnosed as stage IIb (T1b, N1, M0) non-small cell lung cancer, adenocarcinoma presented with right upper lobe lung nodule.  She was initially diagnosed in June 2021 as a stage IIb (T1b, N1, M0) she had metastatic disease to the bones diagnosed in November 2023.    Detected Alteration(s) / Biomarker(s) Associated FDA-approved therapies         Clinical Trial Availability          % cfDNA or Amplification BRAF V600E approved by FDA Dabrafenib+trametinib, Encorafenib+binimetinib approved in other indication Cobimetinib, Dabrafenib, Trametinib, Vemurafenib, Vemurafenib+cobimetinib Yes      8.3%   BRCA1 R252S None (VUS) None (VUS) 0.1%     SMAD4 D457fs None None 7.2%   PD-L1 expression 35%  PRIOR THERAPY: 1) Status post right upper lobectomy with lymph node dissection on September 30, 2019 in [place].  She declines adjuvant systemic chemotherapy. 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 Lisbeth Renshaw.  CURRENT THERAPY:  Systemic chemotherapy with carboplatin for AUC of 5, Alimta 500 Mg/M2 and Keytruda 200 Mg IV every 3 weeks.  First dose April 16, 2022.  Status post 1 cycle.   INTERVAL HISTORY: Brandi Holmes 72 y.o. female returns to the clinic today for a follow-up visit.  The patient was last seen by Dr. Julien Nordmann on 04/24/2022.  The patient is currently undergoing palliative systemic chemotherapy. The patient overall tolerated treatment well without any concerning adverse side effects.    The patient is in a lot of pain today. She has had chronic back pain reportedly for 2.5 years. However, it had worsened a few months ago which lead to her evaluation finding the numerous metastatic  bone lesions. Most of her pain is secondary to the metastatic lesion on T9 which is what prompted her hospital visit which lead to her diagnosis of disease recurrence and metastatic disease. This lesion extends into the proximal right 9th and 10th ribs. The patient has a lot of radiating pain from her back to the anterior right chest/under her right breast. She received palliative radiation to this area. She states by the end of her 6 hours when she is due for another dose of tramadol, she is usually in pain.  She is due for her next dose of tramadol while in the appointment with me and took her tramadol in the exam room.  The patient's pain medication is managed by her PCP.  The patient states she does not like other prescription pain medicine due to the way it makes her feel.  She is intolerant to morphine.  Discussed other long-acting medication such as fentanyl patches which she is not interested in.  I asked the patient if she ever considered seeing a pain clinic for a nerve block.  She states that her PCP recommended this but she is not interested.  Dr. Julien Nordmann referred the patient to interventional radiology over a month ago for possible osteocool or kyphoplasty for metastatic thoracic spine lesion. The patient cancelled her appointment due to "too much going on". She is not interested in rescheduling this until February. She is on gabapentin as well. At some point, her PCP considered changing her to lyrica and sent a prescription. The patient is interested in trying lyrica.  Otherwise, she also saw her PCP for bilateral lower extremity swelling. This apparently started prior to her chemotherapy.  She tries to elevate her legs but is limited due to her back pain. Her PCP recommended compression stockings. However, due to the patients back pain, she has a hard time bending forward to put compression stockings on. She lives in independent living however, there are no staff members   The patient denies any  fever, chills, night sweats, or unexplained weight loss. She denies any shortness of breath, cough, or hemoptysis. She may have limited breathing due to taking a deep breath due to it exacerbating her rib pain.  She denies any nausea, vomiting, or diarrhea.  The patient sometimes experiences constipation.  She also mentions that she blows her nose forcefully and sometimes takes Q-tips in her nose and she has nosebleeds.  She also mentions she sometimes has nasal congestion.  Denies any headache or visual changes. She is here today for evaluation repeat blood work before undergoing cycle #2.   MEDICAL HISTORY: Past Medical History:  Diagnosis Date   Anemia    as a child only   Coronary artery calcification of native artery    Hyperlipidemia    Hypertension    Liver spots    " per pt"   Lung nodule    Wears glasses    Wears partial dentures     ALLERGIES:  is allergic to crab [shellfish allergy] and morphine and related.  MEDICATIONS:  Current Outpatient Medications  Medication Sig Dispense Refill   amLODipine (NORVASC) 5 MG tablet Take 1 tablet (5 mg total) by mouth every evening. (Patient taking differently: Take 5 mg by mouth at bedtime.) 90 tablet 0   aspirin EC 81 MG tablet Take 81 mg by mouth at bedtime. Swallow whole.     atorvastatin (LIPITOR) 20 MG tablet Take 1 tablet (20 mg total) by mouth at bedtime. 90 tablet 0   calcium carbonate (OSCAL) 1500 (600 Ca) MG TABS tablet Take 600 mg of elemental calcium by mouth daily. When able to remember     clobetasol ointment (TEMOVATE) 9.60 % Apply 1 application  topically as needed (rash).     dexamethasone (DECADRON) 4 MG tablet Take 1 tablet (4 mg total) by mouth 3 (three) times daily. 90 tablet 1   fluconazole (DIFLUCAN) 100 MG tablet Take 1 tablet (100 mg total) by mouth daily. Take 2 tabs x 1, then 1 tab QD x 9 additional days. 11 tablet 0   folic acid (FOLVITE) 1 MG tablet Take 1 tablet (1 mg total) by mouth daily. 30 tablet 4    gabapentin (NEURONTIN) 600 MG tablet Take 600 mg by mouth 3 (three) times daily.     lisinopril (ZESTRIL) 20 MG tablet Take 20 mg by mouth in the morning.     MAGNESIUM PO Take 1 tablet by mouth daily. When able to remember     nystatin-triamcinolone ointment (MYCOLOG) Apply 1 application  topically daily.     oxybutynin (DITROPAN) 5 MG tablet Take 5 mg by mouth as needed for bladder spasms.     potassium chloride SA (KLOR-CON M) 20 MEQ tablet Take by mouth daily. Take 1 tablet a day with Calcium and Magnesium when she can remember for leg cramps     prochlorperazine (COMPAZINE) 10 MG tablet Take 1 tablet (10 mg total) by mouth every 6 (six) hours as needed for nausea or vomiting. 30 tablet 0   tiZANidine (ZANAFLEX) 2 MG tablet Take  2 mg by mouth at bedtime.     traMADol (ULTRAM) 50 MG tablet Take 50 mg by mouth every 6 (six) hours as needed for severe pain. Every 8 hours     No current facility-administered medications for this visit.    SURGICAL HISTORY:  Past Surgical History:  Procedure Laterality Date   ABDOMINAL HYSTERECTOMY  1974   heavy bleeding   ABDOMINAL HYSTERECTOMY     BRONCHIAL BRUSHINGS  08/13/2019   Procedure: BRONCHIAL BRUSHINGS;  Surgeon: Garner Nash, DO;  Location: [facility];  Service: Pulmonary;;   BRONCHIAL WASHINGS  08/13/2019   Procedure: BRONCHIAL WASHINGS;  Surgeon: Garner Nash, DO;  Location: [facility];  Service: Pulmonary;;   COLONOSCOPY W/ BIOPSIES AND POLYPECTOMY     DILATION AND CURETTAGE OF UTERUS     FINE NEEDLE ASPIRATION  08/13/2019   Procedure: FINE NEEDLE ASPIRATION (FNA) LINEAR;  Surgeon: Garner Nash, DO;  Location: [facility];  Service: Pulmonary;;   FOOT SURGERY     bilateral bunions and hammer toes   LUNG BIOPSY  08/13/2019   Procedure: LUNG BIOPSY;  Surgeon: Garner Nash, DO;  Location: [facility];  Service: Pulmonary;;   MULTIPLE TOOTH EXTRACTIONS     VIDEO BRONCHOSCOPY WITH ENDOBRONCHIAL NAVIGATION Right 08/13/2019    Procedure: VIDEO BRONCHOSCOPY WITH ENDOBRONCHIAL NAVIGATION;  Surgeon: Garner Nash, DO;  Location: [facility];  Service: Pulmonary;  Laterality: Right;   VIDEO BRONCHOSCOPY WITH ENDOBRONCHIAL ULTRASOUND Right 08/13/2019   Procedure: VIDEO BRONCHOSCOPY WITH ENDOBRONCHIAL ULTRASOUND;  Surgeon: Garner Nash, DO;  Location: [facility];  Service: Pulmonary;  Laterality: Right;    REVIEW OF SYSTEMS:   Review of Systems  Constitutional: Negative for appetite change, chills, fatigue, fever and unexpected weight change.  HENT: Negative for mouth sores, nosebleeds, sore throat and trouble swallowing.   Eyes: Negative for eye problems and icterus.  Respiratory: Negative for cough, hemoptysis, shortness of breath and wheezing.   Cardiovascular: Positive for right rib pain and back pain.  Positive for bilateral lower extremity swelling.  Negative for chest pain.  Gastrointestinal: Negative for abdominal pain, constipation, diarrhea, nausea and vomiting.  Genitourinary: Negative for bladder incontinence, difficulty urinating, dysuria, frequency and hematuria.   Musculoskeletal: Positive for back pain. Negative for gait problem, neck pain and neck stiffness.  Skin: Negative for itching and rash.  Neurological: Negative for dizziness, extremity weakness, gait problem, headaches, light-headedness and seizures.  Hematological: Negative for adenopathy. Does not bruise/bleed easily.  Psychiatric/Behavioral: Negative for confusion, depression and sleep disturbance. The patient is not nervous/anxious.     PHYSICAL EXAMINATION:  Blood pressure 116/66, pulse 95, temperature 97.9 F (36.6 C), resp. rate 18, weight 172 lb 6.4 oz (78.2 kg), SpO2 93 %.  ECOG PERFORMANCE STATUS: 2  Physical Exam  Constitutional: Oriented to person, place, and time and well-developed, well-nourished, and in no distress.  HENT:  Head: Normocephalic and atraumatic.  Mouth/Throat: Oropharynx is clear and moist. No  oropharyngeal exudate.  Eyes: Conjunctivae are normal. Right eye exhibits no discharge. Left eye exhibits no discharge. No scleral icterus.  Neck: Normal range of motion. Neck supple.  Cardiovascular: Normal rate, regular rhythm, normal heart sounds and intact distal pulses.   Pulmonary/Chest: Effort normal and breath sounds normal. No respiratory distress. No wheezes. No rales.  Abdominal: Soft. Bowel sounds are normal. Exhibits no distension and no mass. There is no tenderness.  Musculoskeletal: Normal range of motion. Bilateral lower extremity swelling.  Lymphadenopathy:    No cervical adenopathy.  Neurological: Alert and oriented to person, place, and time. Exhibits normal muscle tone. Examined in the wheelchair.  Skin: Skin is warm and dry. No rash noted. Not diaphoretic. No erythema. No pallor.  Psychiatric: Mood, memory and judgment normal.  Vitals reviewed.  LABORATORY DATA: Lab Results  Component Value Date   WBC 6.4 05/09/2022   HGB 10.2 (L) 05/09/2022   HCT 31.9 (L) 05/09/2022   MCV 92.7 05/09/2022   PLT 407 (H) 05/09/2022      Chemistry      Component Value Date/Time   NA 140 05/09/2022 1112   NA 142 07/22/2019 1522   K 3.7 05/09/2022 1112   CL 108 05/09/2022 1112   CO2 25 05/09/2022 1112   BUN 8 05/09/2022 1112   BUN 13 07/22/2019 1522   CREATININE 0.90 05/09/2022 1112   CREATININE 0.91 09/16/2011 1000      Component Value Date/Time   CALCIUM 8.7 (L) 05/09/2022 1112   ALKPHOS 101 05/09/2022 1112   AST 20 05/09/2022 1112   ALT 10 05/09/2022 1112   BILITOT 0.5 05/09/2022 1112       RADIOGRAPHIC STUDIES:  No results found.   ASSESSMENT/PLAN:  This is a pleasant 72 year old African-American female diagnosed with metastatic also lung cancer, adenocarcinoma initially diagnosed as stage IIb (T1b, N1, M0) non-small cell lung cancer, adenocarcinoma status post a right upper lobectomy with lymph node dissection on September 30, 2019 in [place].  She declined adjuvant  systemic chemotherapy at that time. She was found to have evidence for disease metastasis in November 2023 presenting with multiple metastatic bone lesions.  The patient had molecular studies that showed an actionable mutation with BRAF V600E mutation and PD-L1 expression of 35%.   She underwent palliative radiotherapy to the T9 vertebral lesion under the care of Dr. Lisbeth Renshaw. The patient is currently undergoing first-line combination of systemic chemotherapy with carboplatin for AUC of 5, Alimta 500 Mg/M2 and Keytruda 200 Mg IV every 3 weeks.  Status post 1 cycle.    Labs were reviewed.  Reviewed the patient's symptoms with Dr. Julien Nordmann today.  She will proceed with cycle #2 today scheduled.  The patient states that she did not notice any adverse side effects of treatment after cycle 1.  The patient was seen in the clinic when she was due for another dose of her pain medicine.  She took a tramadol during our encounter. She was in quite a bit of pain during the encounter today.  We had a lengthy discussion about her pain today.  Apparently this pain has been ongoing for several months.  Her tramadol is prescribed by her PCP as well as gabapentin.  The patient states that she has intolerance to other pain medications.  We discussed alternative options such as referral to palliative care, referral to pain clinic for consideration of nerve block, referral to IR, etc.  The patient is not interested.  I offered to prescribe long-acting pain medicine with fentanyl patch since the patient has intolerance to morphine per chart review.  The patient declined.  The patient is not interested in any type of evaluation or management of her back pain until February.  I gave the patient the number to radiology scheduling to reschedule her interventional radiology evaluation.  I discussed with the patient think this would greatly help her quality of life as she seems very uncomfortable during the encounter today.  I will  arrange for her to receive 650 mg of Tylenol while in the  infusion room today.  Also ordered x-rays of her thoracic and lumbar spine to be performed after her infusion today to assess for any new acute concerns such as compression fractures for her pain.  Will see the patient back for follow-up visit in 3 weeks for evaluation before starting cycle #3.  Regarding the patient's lower extremity swelling, I be hesitant to put the patient on Lasix due to her borderline low blood pressure.  I encouraged the patient to use the compression stockings at her PCP prescribed.  I reviewed with the patient tips on how to get these on easier.  The patient has limited ability to put these on due to having a hard time bending forward due to her back pain.  The patient states that there are no staff members at her independent living that his sister.  [place] the patient in the infusion room if she is interested in a referral to home health for some nursing assistance.  They might also be able to help her wrap her legs.  She was advised to elevate her legs is much as she can andavoid salty foods  During the patient's nosebleeds, I encouraged her to avoid forceful blowing and picking up her nose which can exacerbate bleeding.  I also encouraged her to take an antihistamine due to her nasal congestion and Mucinex if needed.  Encouraged her to use saline sprays in her nose to keep her nose moist.  Encouraged the patient to take a stool softener to prevent constipation.  She is on tramadol for her pain and likely needs to be on a bowel regimen with stool softener to keep her bowels regular.  The patient was advised to call immediately if she has any concerning symptoms in the interval. The patient voices understanding of current disease status and treatment options and is in agreement with the current care plan. All questions were answered. The patient knows to call the clinic with any problems, questions or concerns. We can  certainly see the patient much sooner if necessary       Orders Placed This Encounter  Procedures   DG Lumbar Spine 2-3 Views    Standing Status:   Future    Standing Expiration Date:   05/09/2023    Order Specific Question:   Reason for Exam (SYMPTOM  OR DIAGNOSIS REQUIRED)    Answer:   check bone mets and comprsesion fractures to make sure not worse, complainting of worsening back pain    Order Specific Question:   Preferred imaging location?    Answer:   [facility]   DG Thoracic Spine 2 View    Standing Status:   Future    Standing Expiration Date:   05/09/2023    Order Specific Question:   Reason for Exam (SYMPTOM  OR DIAGNOSIS REQUIRED)    Answer:   check bone mets and comprsesion fractures to make sure not worse, complainting of worsening back pain    Order Specific Question:   Preferred imaging location?    Answer:   [facility]     The total time spent in the appointment was 30-39 minutes  [place], PA-C 05/09/22

## 2022-05-09 ENCOUNTER — Inpatient Hospital Stay: Payer: Medicare HMO

## 2022-05-09 ENCOUNTER — Inpatient Hospital Stay (HOSPITAL_BASED_OUTPATIENT_CLINIC_OR_DEPARTMENT_OTHER): Payer: Medicare HMO | Admitting: Physician Assistant

## 2022-05-09 ENCOUNTER — Other Ambulatory Visit: Payer: Medicare HMO

## 2022-05-09 ENCOUNTER — Ambulatory Visit: Payer: Medicare HMO | Admitting: Physician Assistant

## 2022-05-09 ENCOUNTER — Ambulatory Visit: Payer: Medicare HMO

## 2022-05-09 VITALS — BP 116/66 | HR 95 | Temp 97.9°F | Resp 18 | Wt 172.4 lb

## 2022-05-09 VITALS — BP 111/54 | HR 70 | Temp 98.7°F | Resp 16

## 2022-05-09 DIAGNOSIS — G893 Neoplasm related pain (acute) (chronic): Secondary | ICD-10-CM | POA: Diagnosis not present

## 2022-05-09 DIAGNOSIS — C7951 Secondary malignant neoplasm of bone: Secondary | ICD-10-CM

## 2022-05-09 DIAGNOSIS — Z5112 Encounter for antineoplastic immunotherapy: Secondary | ICD-10-CM

## 2022-05-09 DIAGNOSIS — C3411 Malignant neoplasm of upper lobe, right bronchus or lung: Secondary | ICD-10-CM

## 2022-05-09 DIAGNOSIS — Z5111 Encounter for antineoplastic chemotherapy: Secondary | ICD-10-CM

## 2022-05-09 LAB — CMP (CANCER CENTER ONLY)
ALT: 10 U/L (ref 0–44)
AST: 20 U/L (ref 15–41)
Albumin: 3 g/dL — ABNORMAL LOW (ref 3.5–5.0)
Alkaline Phosphatase: 101 U/L (ref 38–126)
Anion gap: 7 (ref 5–15)
BUN: 8 mg/dL (ref 8–23)
CO2: 25 mmol/L (ref 22–32)
Calcium: 8.7 mg/dL — ABNORMAL LOW (ref 8.9–10.3)
Chloride: 108 mmol/L (ref 98–111)
Creatinine: 0.9 mg/dL (ref 0.44–1.00)
GFR, Estimated: >60 mL/min (ref >60)
Glucose, Bld: 88 mg/dL (ref 70–99)
Potassium: 3.7 mmol/L (ref 3.5–5.1)
Sodium: 140 mmol/L (ref 135–145)
Total Bilirubin: 0.5 mg/dL (ref 0.3–1.2)
Total Protein: 6.3 g/dL — ABNORMAL LOW (ref 6.5–8.1)

## 2022-05-09 LAB — CBC WITH DIFFERENTIAL (CANCER CENTER ONLY)
Abs Immature Granulocytes: 0.02 10*3/uL (ref 0.00–0.07)
Basophils Absolute: 0.1 10*3/uL (ref 0.0–0.1)
Basophils Relative: 1 %
Eosinophils Absolute: 0.3 10*3/uL (ref 0.0–0.5)
Eosinophils Relative: 4 %
HCT: 31.9 % — ABNORMAL LOW (ref 36.0–46.0)
Hemoglobin: 10.2 g/dL — ABNORMAL LOW (ref 12.0–15.0)
Immature Granulocytes: 0 %
Lymphocytes Relative: 25 %
Lymphs Abs: 1.6 10*3/uL (ref 0.7–4.0)
MCH: 29.7 pg (ref 26.0–34.0)
MCHC: 32 g/dL (ref 30.0–36.0)
MCV: 92.7 fL (ref 80.0–100.0)
Monocytes Absolute: 1.2 10*3/uL — ABNORMAL HIGH (ref 0.1–1.0)
Monocytes Relative: 19 %
Neutro Abs: 3.2 10*3/uL (ref 1.7–7.7)
Neutrophils Relative %: 51 %
Platelet Count: 407 10*3/uL — ABNORMAL HIGH (ref 150–400)
RBC: 3.44 MIL/uL — ABNORMAL LOW (ref 3.87–5.11)
RDW: 15.6 % — ABNORMAL HIGH (ref 11.5–15.5)
WBC Count: 6.4 10*3/uL (ref 4.0–10.5)
nRBC: 0 % (ref 0.0–0.2)

## 2022-05-09 MED ORDER — SODIUM CHLORIDE 0.9 % IV SOLN
150.0000 mg | Freq: Once | INTRAVENOUS | Status: AC
  Administered 2022-05-09: 150 mg via INTRAVENOUS
  Filled 2022-05-09: qty 150

## 2022-05-09 MED ORDER — SODIUM CHLORIDE 0.9 % IV SOLN
10.0000 mg | Freq: Once | INTRAVENOUS | Status: AC
  Administered 2022-05-09: 10 mg via INTRAVENOUS
  Filled 2022-05-09: qty 10

## 2022-05-09 MED ORDER — SODIUM CHLORIDE 0.9 % IV SOLN
500.0000 mg/m2 | Freq: Once | INTRAVENOUS | Status: AC
  Administered 2022-05-09: 900 mg via INTRAVENOUS
  Filled 2022-05-09: qty 20

## 2022-05-09 MED ORDER — PALONOSETRON HCL INJECTION 0.25 MG/5ML
0.2500 mg | Freq: Once | INTRAVENOUS | Status: AC
  Administered 2022-05-09: 0.25 mg via INTRAVENOUS
  Filled 2022-05-09: qty 5

## 2022-05-09 MED ORDER — SODIUM CHLORIDE 0.9 % IV SOLN: Freq: Once | INTRAVENOUS | Status: AC

## 2022-05-09 MED ORDER — SODIUM CHLORIDE 0.9 % IV SOLN
200.0000 mg | Freq: Once | INTRAVENOUS | Status: AC
  Administered 2022-05-09: 200 mg via INTRAVENOUS
  Filled 2022-05-09: qty 8

## 2022-05-09 MED ORDER — SODIUM CHLORIDE 0.9 % IV SOLN
425.5000 mg | Freq: Once | INTRAVENOUS | Status: AC
  Administered 2022-05-09: 450 mg via INTRAVENOUS
  Filled 2022-05-09: qty 45

## 2022-05-09 NOTE — Patient Instructions (Signed)
[address]  Discharge Instructions: Thank you for choosing [facility] to provide your oncology and hematology care.   If you have a lab appointment with the [facility], please go directly to the [facility] and check in at the registration area.   Wear comfortable clothing and clothing appropriate for easy access to any Portacath or PICC line.   We strive to give you quality time with your provider. You may need to reschedule your appointment if you arrive late (15 or more minutes).  Arriving late affects you and other patients whose appointments are after yours.  Also, if you miss three or more appointments without notifying the office, you may be dismissed from the clinic at the provider's discretion.      For prescription refill requests, have your pharmacy contact our office and allow 72 hours for refills to be completed.    Today you received the following chemotherapy and/or immunotherapy agents: Pembrolizumab (Keytruda), Pemetrexed (Alimta), Carboplatin (Paraplatin)      To help prevent nausea and vomiting after your treatment, we encourage you to take your nausea medication as directed.  BELOW ARE SYMPTOMS THAT SHOULD BE REPORTED IMMEDIATELY: *FEVER GREATER THAN 100.4 F (38 C) OR HIGHER *CHILLS OR SWEATING *NAUSEA AND VOMITING THAT IS NOT CONTROLLED WITH YOUR NAUSEA MEDICATION *UNUSUAL SHORTNESS OF BREATH *UNUSUAL BRUISING OR BLEEDING *URINARY PROBLEMS (pain or burning when urinating, or frequent urination) *BOWEL PROBLEMS (unusual diarrhea, constipation, pain near the anus) TENDERNESS IN MOUTH AND THROAT WITH OR WITHOUT PRESENCE OF ULCERS (sore throat, sores in mouth, or a toothache) UNUSUAL RASH, SWELLING OR PAIN  UNUSUAL VAGINAL DISCHARGE OR ITCHING   Items with * indicate a potential emergency and should be followed up as soon as possible or go to the Emergency Department if any problems should occur.  Please show  the CHEMOTHERAPY ALERT CARD or IMMUNOTHERAPY ALERT CARD at check-in to the Emergency Department and triage nurse.  Should you have questions after your visit or need to cancel or reschedule your appointment, please contact [facility]  Dept: [phone]  and follow the prompts.  Office hours are 8:00 a.m. to 4:30 p.m. Monday - Friday. Please note that voicemails left after 4:00 p.m. may not be returned until the following business day.  We are closed weekends and major holidays. You have access to a nurse at all times for urgent questions. Please call the main number to the clinic Dept: [phone] and follow the prompts.   For any non-urgent questions, you may also contact your provider using MyChart. We now offer e-Visits for anyone 74 and older to request care online for non-urgent symptoms. For details visit [email or web address].   Also download the MyChart app! Go to the app store, search "MyChart", open the app, select [facility], and log in with your MyChart username and password.

## 2022-05-13 ENCOUNTER — Ambulatory Visit
Admission: RE | Admit: 2022-05-13 | Discharge: 2022-05-13 | Disposition: A | Discharge status: Home / Self Care | Payer: Medicare HMO | Source: Ambulatory Visit | Attending: Physician Assistant | Admitting: Physician Assistant

## 2022-05-13 DIAGNOSIS — Z510 Encounter for antineoplastic radiation therapy: Secondary | ICD-10-CM | POA: Insufficient documentation

## 2022-05-13 DIAGNOSIS — C7951 Secondary malignant neoplasm of bone: Secondary | ICD-10-CM | POA: Insufficient documentation

## 2022-05-13 DIAGNOSIS — Z87891 Personal history of nicotine dependence: Secondary | ICD-10-CM | POA: Insufficient documentation

## 2022-05-13 DIAGNOSIS — C3411 Malignant neoplasm of upper lobe, right bronchus or lung: Secondary | ICD-10-CM | POA: Insufficient documentation

## 2022-05-13 NOTE — Progress Notes (Signed)
  Radiation Oncology         [phone] ________________________________  Name: Brandi Holmes MRN: [id number]  Date of Service: 05/13/2022  DOB: 12/18/1950  Post Treatment Telephone Note  Diagnosis:  Recurrent metastatic Stage IIB, cT1bN1M0, NSCLC, adenocarcinoma of the RUL with thoracic spin metastasis.   Intent: Palliative  Radiation Treatment Dates: 03/13/2022 through 03/29/2022 Site Technique Total Dose (Gy) Dose per Fx (Gy) Completed Fx Beam Energies  Thoracic Spine: Spine_T9 IMRT 30/30 3 10/10 15X  (as documented in provider EOT note)   The patient was not available for call today. A detailed voicemail was left.   The patient is scheduled for ongoing care with Dr. Julien Nordmann in medical oncology. The patient was encouraged to call if she develops concerns or questions regarding radiation.    Leandra Kern, LPN

## 2022-05-16 ENCOUNTER — Inpatient Hospital Stay: Payer: Medicare HMO | Attending: Physician Assistant

## 2022-05-16 ENCOUNTER — Telehealth: Payer: Self-pay | Admitting: Medical Oncology

## 2022-05-16 ENCOUNTER — Other Ambulatory Visit: Payer: Self-pay | Admitting: Internal Medicine

## 2022-05-16 ENCOUNTER — Other Ambulatory Visit: Payer: Self-pay

## 2022-05-16 DIAGNOSIS — E538 Deficiency of other specified B group vitamins: Secondary | ICD-10-CM | POA: Insufficient documentation

## 2022-05-16 DIAGNOSIS — Z5112 Encounter for antineoplastic immunotherapy: Secondary | ICD-10-CM | POA: Diagnosis not present

## 2022-05-16 DIAGNOSIS — Z923 Personal history of irradiation: Secondary | ICD-10-CM | POA: Insufficient documentation

## 2022-05-16 DIAGNOSIS — C7951 Secondary malignant neoplasm of bone: Secondary | ICD-10-CM

## 2022-05-16 DIAGNOSIS — Z90710 Acquired absence of both cervix and uterus: Secondary | ICD-10-CM | POA: Diagnosis not present

## 2022-05-16 DIAGNOSIS — Z5111 Encounter for antineoplastic chemotherapy: Secondary | ICD-10-CM | POA: Insufficient documentation

## 2022-05-16 DIAGNOSIS — C3411 Malignant neoplasm of upper lobe, right bronchus or lung: Secondary | ICD-10-CM | POA: Diagnosis not present

## 2022-05-16 DIAGNOSIS — Z79899 Other long term (current) drug therapy: Secondary | ICD-10-CM | POA: Insufficient documentation

## 2022-05-16 DIAGNOSIS — I10 Essential (primary) hypertension: Secondary | ICD-10-CM | POA: Diagnosis not present

## 2022-05-16 LAB — CBC WITH DIFFERENTIAL/PLATELET
Abs Immature Granulocytes: 0.03 10*3/uL (ref 0.00–0.07)
Basophils Absolute: 0 10*3/uL (ref 0.0–0.1)
Basophils Relative: 1 %
Eosinophils Absolute: 0.1 10*3/uL (ref 0.0–0.5)
Eosinophils Relative: 2 %
HCT: 32 % — ABNORMAL LOW (ref 36.0–46.0)
Hemoglobin: 10.1 g/dL — ABNORMAL LOW (ref 12.0–15.0)
Immature Granulocytes: 1 %
Lymphocytes Relative: 33 %
Lymphs Abs: 0.9 10*3/uL (ref 0.7–4.0)
MCH: 29.4 pg (ref 26.0–34.0)
MCHC: 31.6 g/dL (ref 30.0–36.0)
MCV: 93.3 fL (ref 80.0–100.0)
Monocytes Absolute: 0.3 10*3/uL (ref 0.1–1.0)
Monocytes Relative: 10 %
Neutro Abs: 1.4 10*3/uL — ABNORMAL LOW (ref 1.7–7.7)
Neutrophils Relative %: 53 %
Platelets: 305 10*3/uL (ref 150–400)
RBC: 3.43 MIL/uL — ABNORMAL LOW (ref 3.87–5.11)
RDW: 15 % (ref 11.5–15.5)
WBC: 2.6 10*3/uL — ABNORMAL LOW (ref 4.0–10.5)
nRBC: 0 % (ref 0.0–0.2)

## 2022-05-16 LAB — COMPREHENSIVE METABOLIC PANEL
ALT: 13 U/L (ref 0–44)
AST: 26 U/L (ref 15–41)
Albumin: 3 g/dL — ABNORMAL LOW (ref 3.5–5.0)
Alkaline Phosphatase: 81 U/L (ref 38–126)
Anion gap: 7 (ref 5–15)
BUN: 10 mg/dL (ref 8–23)
CO2: 25 mmol/L (ref 22–32)
Calcium: 8.7 mg/dL — ABNORMAL LOW (ref 8.9–10.3)
Chloride: 106 mmol/L (ref 98–111)
Creatinine, Ser: 0.85 mg/dL (ref 0.44–1.00)
GFR, Estimated: >60 mL/min (ref >60)
Glucose, Bld: 78 mg/dL (ref 70–99)
Potassium: 3.9 mmol/L (ref 3.5–5.1)
Sodium: 138 mmol/L (ref 135–145)
Total Bilirubin: 0.5 mg/dL (ref 0.3–1.2)
Total Protein: 6.5 g/dL (ref 6.5–8.1)

## 2022-05-16 NOTE — Telephone Encounter (Signed)
Pt told Tiffany that she does not want to do Kyphoplasty or get a port because she does not have anyone to stay with her overnight and no transportation.

## 2022-05-20 ENCOUNTER — Other Ambulatory Visit: Payer: Medicare HMO

## 2022-05-20 ENCOUNTER — Ambulatory Visit
Admission: RE | Admit: 2022-05-20 | Discharge: 2022-05-20 | Disposition: A | Discharge status: Home / Self Care | Payer: Medicare HMO | Source: Ambulatory Visit | Attending: Internal Medicine | Admitting: Internal Medicine

## 2022-05-20 ENCOUNTER — Encounter: Payer: Self-pay | Admitting: Internal Medicine

## 2022-05-20 ENCOUNTER — Other Ambulatory Visit: Payer: Self-pay | Admitting: Interventional Radiology

## 2022-05-20 DIAGNOSIS — C7951 Secondary malignant neoplasm of bone: Secondary | ICD-10-CM

## 2022-05-20 DIAGNOSIS — M792 Neuralgia and neuritis, unspecified: Secondary | ICD-10-CM

## 2022-05-20 HISTORY — PX: IR RADIOLOGIST EVAL & MGMT: IMG5224

## 2022-05-20 NOTE — Consult Note (Signed)
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  She is now status post right upper lobectomy with lymph node dissection on September 30, 2019 performed in [place].  She declined adjuvant systemic chemotherapy and unfortunately presented with osseous metastatic disease in late 2023.  She had symptomatic mets at T9 involving the adjacent right 9th rib.   Palliative radiotherapy to the metastatic bone disease in the thoracic spine was completed March 29, 2022 under the care of Dr. Lisbeth Renshaw.  Her symptoms persist and consist of severe with rib pain radiating around her chest to just under her right breast.  The pain is fine when laying down, but any movement or activity causes severe 10/10 pain that can take her to her knees.     CURRENT THERAPY: Systemic chemotherapy with carboplatin for AUC of 5, Alimta 500 Mg/M2 and Keytruda 200 Mg IV every 3 weeks.  First dose April 16, 2022.  Status post 1 cycle.     Past Medical History:  Diagnosis Date   Anemia    as a child only   Coronary artery calcification of native artery    Hyperlipidemia    Hypertension    Liver spots    " per pt"   Lung nodule    Wears glasses    Wears partial dentures     Past Surgical History:  Procedure Laterality Date   ABDOMINAL HYSTERECTOMY  1974   heavy bleeding   ABDOMINAL HYSTERECTOMY     BRONCHIAL BRUSHINGS  08/13/2019   Procedure: BRONCHIAL BRUSHINGS;  Surgeon: Garner Nash, DO;  Location: [facility];  Service: Pulmonary;;   BRONCHIAL WASHINGS  08/13/2019   Procedure: BRONCHIAL WASHINGS;  Surgeon: Garner Nash, DO;  Location: [facility];  Service: Pulmonary;;   COLONOSCOPY W/  BIOPSIES AND POLYPECTOMY     DILATION AND CURETTAGE OF UTERUS     FINE NEEDLE ASPIRATION  08/13/2019   Procedure: FINE NEEDLE ASPIRATION (FNA) LINEAR;  Surgeon: Garner Nash, DO;  Location: [facility];  Service: Pulmonary;;   FOOT SURGERY     bilateral bunions and hammer toes   IR RADIOLOGIST EVAL & MGMT  05/20/2022   LUNG BIOPSY  08/13/2019   Procedure: LUNG BIOPSY;  Surgeon: Garner Nash, DO;  Location: [facility];  Service: Pulmonary;;   MULTIPLE TOOTH EXTRACTIONS     VIDEO BRONCHOSCOPY WITH ENDOBRONCHIAL NAVIGATION Right 08/13/2019   Procedure: VIDEO BRONCHOSCOPY WITH ENDOBRONCHIAL NAVIGATION;  Surgeon: Garner Nash, DO;  Location: [facility];  Service: Pulmonary;  Laterality: Right;   VIDEO BRONCHOSCOPY WITH ENDOBRONCHIAL ULTRASOUND Right 08/13/2019   Procedure: VIDEO BRONCHOSCOPY WITH ENDOBRONCHIAL ULTRASOUND;  Surgeon: Garner Nash, DO;  Location: [facility];  Service: Pulmonary;  Laterality: Right;    Allergies: Crab [shellfish allergy] and Morphine and related  Medications: Prior to Admission medications   Medication Sig Start Date End Date Taking? Authorizing Provider  aspirin EC 81 MG tablet Take 81 mg by mouth at bedtime. Swallow whole.   Yes [provider]  calcium carbonate (OSCAL) 1500 (600 Ca) MG TABS tablet Take 600 mg of elemental calcium by mouth daily. When able to remember   Yes [provider]  clobetasol ointment (TEMOVATE) 0.05 % Apply  1 application  topically as needed (rash). 06/06/21  Yes [provider]  folic acid (FOLVITE) 1 MG tablet Take 1 tablet (1 mg total) by mouth daily. 04/10/22  Yes Curt Bears, MD  lisinopril (ZESTRIL) 20 MG tablet Take 20 mg by mouth in the morning.   Yes [provider]  MAGNESIUM PO Take 1 tablet by mouth daily. When able to remember   Yes [provider]  potassium chloride SA (KLOR-CON M) 20 MEQ tablet Take by mouth daily. Take 1 tablet a day with Calcium and  Magnesium when she can remember for leg cramps   Yes [provider]  pregabalin (LYRICA) 25 MG capsule Take 50 mg by mouth 2 (two) times daily.   Yes [provider]  tiZANidine (ZANAFLEX) 2 MG tablet Take 2 mg by mouth at bedtime. 07/05/21  Yes [provider]  traMADol (ULTRAM) 50 MG tablet Take 50 mg by mouth every 6 (six) hours as needed for severe pain. Every 8 hours 02/07/20  Yes [provider]  amLODipine (NORVASC) 5 MG tablet Take 1 tablet (5 mg total) by mouth every evening. Patient taking differently: Take 5 mg by mouth at bedtime. 10/09/20 03/21/22  Tolia, Sunit, DO  atorvastatin (LIPITOR) 20 MG tablet Take 1 tablet (20 mg total) by mouth at bedtime. 08/22/20 03/21/22  Tolia, Sunit, DO  dexamethasone (DECADRON) 4 MG tablet Take 1 tablet (4 mg total) by mouth 3 (three) times daily. 03/13/22   Hayden Pedro, PA-C  fluconazole (DIFLUCAN) 100 MG tablet Take 1 tablet (100 mg total) by mouth daily. Take 2 tabs x 1, then 1 tab QD x 9 additional days. 03/29/22   Kyung Rudd, MD  gabapentin (NEURONTIN) 600 MG tablet Take 600 mg by mouth 3 (three) times daily. Patient not taking: Reported on 05/20/2022 07/05/21   [provider]  nystatin-triamcinolone ointment (MYCOLOG) Apply 1 application  topically daily.    [provider]  oxybutynin (DITROPAN) 5 MG tablet Take 5 mg by mouth as needed for bladder spasms.    [provider]  prochlorperazine (COMPAZINE) 10 MG tablet Take 1 tablet (10 mg total) by mouth every 6 (six) hours as needed for nausea or vomiting. Patient not taking: Reported on 05/20/2022 04/10/22   Curt Bears, MD     Family History  Problem Relation Age of Onset   Heart disease Mother 9       bypass at age    Hypertension Mother    Pneumonia Brother    Cancer Paternal Aunt    Diabetes Neg Hx    Stroke Neg Hx     Social History   Socioeconomic History   Marital status: Divorced    Spouse name: Not on  file   Number of children: 2   Years of education: Not on file   Highest education level: Not on file  Occupational History   Not on file  Tobacco Use   Smoking status: Former    Packs/day: 0.30    Years: 50.00    Total pack years: 15.00    Types: Cigarettes    Quit date: 2016    Years since quitting: 8.1   Smokeless tobacco: Never  Vaping Use   Vaping Use: Never used  Substance and Sexual Activity   Alcohol use: Not Currently    Comment: occasional   Drug use: Not Currently   Sexual activity: Not Currently  Other Topics Concern   Not on file  Social History Narrative   **  Merged History Encounter **       Lives alone.  Divoced.  Adult children- 1 living, 1 deceased.   Social Determinants of Health   Financial Resource Strain: Not on file  Food Insecurity: Not on file  Transportation Needs: Not on file  Physical Activity: Not on file  Stress: Not on file  Social Connections: Not on file    ECOG Status: 2 - Symptomatic, <50% confined to bed  Review of Systems: A 12 point ROS discussed and pertinent positives are indicated in the HPI above.  All other systems are negative.  Review of Systems  Vital Signs: BP (!) 88/57 (BP Location: Right Arm, Patient Position: Sitting, Cuff Size: Normal)   Pulse 84   Temp 98.4 F (36.9 C) (Oral)   Resp 14   Wt 77.1 kg   SpO2 96%   BMI 27.03 kg/m    Physical Exam Constitutional:      Appearance: Normal appearance.  HENT:     Head: Normocephalic and atraumatic.  Eyes:     General: No scleral icterus. Cardiovascular:     Rate and Rhythm: Normal rate.  Pulmonary:     Effort: Pulmonary effort is normal.  Abdominal:     General: Abdomen is flat. There is no distension.     Tenderness: There is no abdominal tenderness.  Musculoskeletal:       Arms:  Neurological:     Mental Status: She is alert and oriented to person, place, and time.  Psychiatric:        Mood and Affect: Mood normal.        Behavior: Behavior  normal.       Imaging: IR Radiologist Eval & Mgmt  Result Date: 05/20/2022 EXAM: NEW PATIENT OFFICE VISIT CHIEF COMPLAINT: SEE EPIC NOTE HISTORY OF PRESENT ILLNESS: SEE EPIC NOTE REVIEW OF SYSTEMS: SEE EPIC NOTE PHYSICAL EXAMINATION: SEE EPIC NOTE ASSESSMENT AND PLAN: SEE EPIC NOTE Electronically Signed   By: Jacqulynn Cadet M.D.   On: 05/20/2022 14:14    Labs:  CBC: Recent Labs    04/16/22 1542 04/24/22 1531 05/09/22 1112 05/16/22 1315  WBC 7.0 3.5* 6.4 2.6*  HGB 11.1* 10.7* 10.2* 10.1*  HCT 34.9* 33.4* 31.9* 32.0*  PLT 381 303 407* 305    COAGS: Recent Labs    03/10/22 0627 03/11/22 0452 03/12/22 0548 03/13/22 0631  INR 0.9 1.0 1.0 1.0    BMP: Recent Labs    04/16/22 1542 04/24/22 1531 05/09/22 1112 05/16/22 1315  NA 135 138 140 138  K 4.0 4.1 3.7 3.9  CL 102 107 108 106  CO2 25 24 25 25   GLUCOSE 89 91 88 78  BUN 14 9 8 10   CALCIUM 8.9 8.5* 8.7* 8.7*  CREATININE 0.98 0.75 0.90 0.85  GFRNONAA >60 >60 >60 >60    LIVER FUNCTION TESTS: Recent Labs    04/16/22 1542 04/24/22 1531 05/09/22 1112 05/16/22 1315  BILITOT 0.5 0.6 0.5 0.5  AST 27 26 20 26   ALT 20 23 10 13   ALKPHOS 87 72 101 81  PROT 6.5 6.0* 6.3* 6.5  ALBUMIN 3.1* 3.0* 3.0* 3.0*    TUMOR MARKERS: No results for input(s): "AFPTM", "CEA", "CA199", "CHROMGRNA" in the last 8760 hours.  Assessment and Plan:  Very pleasant but unfortunate 72 year-old lady with metastatic lung adenocarcinoma.  Her primary symptoms are related to intercostal nerve pain on the right emanating from the complex metastatic lesion at T9.  She is not a candidate for [employer]  given the severe central and formainal stenosis which completely effaces the CSF space and poses a risk for thermal injury to the cord.   Given her symptoms are largely due to intercostal nerve pain, I think she could benefit from an intercostal nerve block which my partner, Dr. Sharen Heck Mir performs.  I have discussed her case with him and he  is willing to attempt right 9th rib intercostal block.  I will ask my staff to assist scheduling.   Thank you for this interesting consult.  I greatly enjoyed meeting Brandi Holmes and look forward to participating in their care.  A copy of this report was sent to the requesting provider on this date.  Electronically Signed: Criselda Peaches 05/20/2022, 2:33 PM   I spent a total of  40 Minutes  in face to face in clinical consultation, greater than 50% of which was counseling/coordinating care for back and right ninth rib pain due to metastatic disease.  '

## 2022-05-21 ENCOUNTER — Other Ambulatory Visit: Payer: Self-pay | Admitting: Internal Medicine

## 2022-05-21 DIAGNOSIS — M792 Neuralgia and neuritis, unspecified: Secondary | ICD-10-CM

## 2022-05-22 ENCOUNTER — Other Ambulatory Visit: Payer: Self-pay | Admitting: Internal Medicine

## 2022-05-22 ENCOUNTER — Ambulatory Visit
Admission: RE | Admit: 2022-05-22 | Discharge: 2022-05-22 | Disposition: A | Discharge status: Home / Self Care | Payer: Medicare HMO | Source: Ambulatory Visit | Attending: Interventional Radiology | Admitting: Interventional Radiology

## 2022-05-22 ENCOUNTER — Ambulatory Visit
Admission: RE | Admit: 2022-05-22 | Discharge: 2022-05-22 | Disposition: A | Discharge status: Home / Self Care | Payer: Medicare HMO | Source: Ambulatory Visit | Attending: Internal Medicine | Admitting: Internal Medicine

## 2022-05-22 DIAGNOSIS — M792 Neuralgia and neuritis, unspecified: Secondary | ICD-10-CM

## 2022-05-22 DIAGNOSIS — C7951 Secondary malignant neoplasm of bone: Secondary | ICD-10-CM

## 2022-05-22 MED ORDER — METHYLPREDNISOLONE ACETATE 40 MG/ML INJ SUSP (RADIOLOG
80.0000 mg | Freq: Once | INTRAMUSCULAR | Status: AC
  Administered 2022-05-22: 80 mg via INTRALESIONAL

## 2022-05-23 ENCOUNTER — Inpatient Hospital Stay: Payer: Medicare HMO

## 2022-05-23 DIAGNOSIS — C7951 Secondary malignant neoplasm of bone: Secondary | ICD-10-CM

## 2022-05-23 DIAGNOSIS — Z5111 Encounter for antineoplastic chemotherapy: Secondary | ICD-10-CM | POA: Diagnosis not present

## 2022-05-23 LAB — CBC WITH DIFFERENTIAL/PLATELET
Abs Immature Granulocytes: 0.01 10*3/uL (ref 0.00–0.07)
Basophils Absolute: 0 10*3/uL (ref 0.0–0.1)
Basophils Relative: 0 %
Eosinophils Absolute: 0.1 10*3/uL (ref 0.0–0.5)
Eosinophils Relative: 1 %
HCT: 31.9 % — ABNORMAL LOW (ref 36.0–46.0)
Hemoglobin: 10.4 g/dL — ABNORMAL LOW (ref 12.0–15.0)
Immature Granulocytes: 0 %
Lymphocytes Relative: 32 %
Lymphs Abs: 1.6 10*3/uL (ref 0.7–4.0)
MCH: 29.9 pg (ref 26.0–34.0)
MCHC: 32.6 g/dL (ref 30.0–36.0)
MCV: 91.7 fL (ref 80.0–100.0)
Monocytes Absolute: 1.1 10*3/uL — ABNORMAL HIGH (ref 0.1–1.0)
Monocytes Relative: 23 %
Neutro Abs: 2.1 10*3/uL (ref 1.7–7.7)
Neutrophils Relative %: 44 %
Platelets: 167 10*3/uL (ref 150–400)
RBC: 3.48 MIL/uL — ABNORMAL LOW (ref 3.87–5.11)
RDW: 15.5 % (ref 11.5–15.5)
WBC: 5 10*3/uL (ref 4.0–10.5)
nRBC: 0 % (ref 0.0–0.2)

## 2022-05-23 LAB — COMPREHENSIVE METABOLIC PANEL
ALT: 11 U/L (ref 0–44)
AST: 22 U/L (ref 15–41)
Albumin: 3.2 g/dL — ABNORMAL LOW (ref 3.5–5.0)
Alkaline Phosphatase: 82 U/L (ref 38–126)
Anion gap: 9 (ref 5–15)
BUN: 11 mg/dL (ref 8–23)
CO2: 24 mmol/L (ref 22–32)
Calcium: 9.3 mg/dL (ref 8.9–10.3)
Chloride: 106 mmol/L (ref 98–111)
Creatinine, Ser: 0.82 mg/dL (ref 0.44–1.00)
GFR, Estimated: >60 mL/min (ref >60)
Glucose, Bld: 99 mg/dL (ref 70–99)
Potassium: 4.1 mmol/L (ref 3.5–5.1)
Sodium: 139 mmol/L (ref 135–145)
Total Bilirubin: 0.6 mg/dL (ref 0.3–1.2)
Total Protein: 6.5 g/dL (ref 6.5–8.1)

## 2022-05-29 MED FILL — Dexamethasone Sodium Phosphate Inj 100 MG/10ML: INTRAMUSCULAR | Qty: 1 | Status: AC

## 2022-05-29 MED FILL — Fosaprepitant Dimeglumine For IV Infusion 150 MG (Base Eq): INTRAVENOUS | Qty: 5 | Status: AC

## 2022-05-30 ENCOUNTER — Inpatient Hospital Stay: Payer: Medicare HMO

## 2022-05-30 ENCOUNTER — Encounter: Payer: Self-pay | Admitting: Internal Medicine

## 2022-05-30 ENCOUNTER — Ambulatory Visit: Payer: Medicare HMO

## 2022-05-30 ENCOUNTER — Inpatient Hospital Stay (HOSPITAL_BASED_OUTPATIENT_CLINIC_OR_DEPARTMENT_OTHER): Payer: Medicare HMO | Admitting: Internal Medicine

## 2022-05-30 ENCOUNTER — Other Ambulatory Visit: Payer: Medicare HMO

## 2022-05-30 ENCOUNTER — Ambulatory Visit: Payer: Medicare HMO | Admitting: Internal Medicine

## 2022-05-30 VITALS — BP 99/55 | HR 78 | Temp 98.3°F | Resp 16 | Wt 163.8 lb

## 2022-05-30 DIAGNOSIS — C3490 Malignant neoplasm of unspecified part of unspecified bronchus or lung: Secondary | ICD-10-CM | POA: Diagnosis not present

## 2022-05-30 DIAGNOSIS — Z5111 Encounter for antineoplastic chemotherapy: Secondary | ICD-10-CM | POA: Diagnosis not present

## 2022-05-30 DIAGNOSIS — C7951 Secondary malignant neoplasm of bone: Secondary | ICD-10-CM

## 2022-05-30 LAB — CBC WITH DIFFERENTIAL (CANCER CENTER ONLY)
Abs Immature Granulocytes: 0.03 10*3/uL (ref 0.00–0.07)
Basophils Absolute: 0.1 10*3/uL (ref 0.0–0.1)
Basophils Relative: 1 %
Eosinophils Absolute: 0.2 10*3/uL (ref 0.0–0.5)
Eosinophils Relative: 3 %
HCT: 32.6 % — ABNORMAL LOW (ref 36.0–46.0)
Hemoglobin: 10.4 g/dL — ABNORMAL LOW (ref 12.0–15.0)
Immature Granulocytes: 1 %
Lymphocytes Relative: 22 %
Lymphs Abs: 1.4 10*3/uL (ref 0.7–4.0)
MCH: 30.1 pg (ref 26.0–34.0)
MCHC: 31.9 g/dL (ref 30.0–36.0)
MCV: 94.2 fL (ref 80.0–100.0)
Monocytes Absolute: 1.1 10*3/uL — ABNORMAL HIGH (ref 0.1–1.0)
Monocytes Relative: 17 %
Neutro Abs: 3.7 10*3/uL (ref 1.7–7.7)
Neutrophils Relative %: 56 %
Platelet Count: 278 10*3/uL (ref 150–400)
RBC: 3.46 MIL/uL — ABNORMAL LOW (ref 3.87–5.11)
RDW: 16.6 % — ABNORMAL HIGH (ref 11.5–15.5)
WBC Count: 6.4 10*3/uL (ref 4.0–10.5)
nRBC: 0 % (ref 0.0–0.2)

## 2022-05-30 LAB — CMP (CANCER CENTER ONLY)
ALT: 9 U/L (ref 0–44)
AST: 17 U/L (ref 15–41)
Albumin: 3.3 g/dL — ABNORMAL LOW (ref 3.5–5.0)
Alkaline Phosphatase: 82 U/L (ref 38–126)
Anion gap: 8 (ref 5–15)
BUN: 12 mg/dL (ref 8–23)
CO2: 24 mmol/L (ref 22–32)
Calcium: 9.1 mg/dL (ref 8.9–10.3)
Chloride: 108 mmol/L (ref 98–111)
Creatinine: 0.91 mg/dL (ref 0.44–1.00)
GFR, Estimated: >60 mL/min (ref >60)
Glucose, Bld: 125 mg/dL — ABNORMAL HIGH (ref 70–99)
Potassium: 4.2 mmol/L (ref 3.5–5.1)
Sodium: 140 mmol/L (ref 135–145)
Total Bilirubin: 0.4 mg/dL (ref 0.3–1.2)
Total Protein: 6.6 g/dL (ref 6.5–8.1)

## 2022-05-30 LAB — TSH: TSH: 0.706 u[IU]/mL (ref 0.350–4.500)

## 2022-05-30 MED ORDER — CYANOCOBALAMIN 1000 MCG/ML IJ SOLN
1000.0000 ug | Freq: Once | INTRAMUSCULAR | Status: AC
  Administered 2022-05-30: 1000 ug via INTRAMUSCULAR
  Filled 2022-05-30: qty 1

## 2022-05-30 MED ORDER — SODIUM CHLORIDE 0.9 % IV SOLN
500.0000 mg/m2 | Freq: Once | INTRAVENOUS | Status: AC
  Administered 2022-05-30: 900 mg via INTRAVENOUS
  Filled 2022-05-30: qty 20

## 2022-05-30 MED ORDER — SODIUM CHLORIDE 0.9 % IV SOLN
200.0000 mg | Freq: Once | INTRAVENOUS | Status: AC
  Administered 2022-05-30: 200 mg via INTRAVENOUS
  Filled 2022-05-30: qty 8

## 2022-05-30 MED ORDER — SODIUM CHLORIDE 0.9 % IV SOLN
10.0000 mg | Freq: Once | INTRAVENOUS | Status: AC
  Administered 2022-05-30: 10 mg via INTRAVENOUS
  Filled 2022-05-30: qty 10

## 2022-05-30 MED ORDER — SODIUM CHLORIDE 0.9 % IV SOLN: Freq: Once | INTRAVENOUS | Status: AC

## 2022-05-30 MED ORDER — PALONOSETRON HCL INJECTION 0.25 MG/5ML
0.2500 mg | Freq: Once | INTRAVENOUS | Status: AC
  Administered 2022-05-30: 0.25 mg via INTRAVENOUS
  Filled 2022-05-30: qty 5

## 2022-05-30 MED ORDER — SODIUM CHLORIDE 0.9 % IV SOLN
150.0000 mg | Freq: Once | INTRAVENOUS | Status: AC
  Administered 2022-05-30: 150 mg via INTRAVENOUS
  Filled 2022-05-30: qty 150

## 2022-05-30 MED ORDER — SODIUM CHLORIDE 0.9 % IV SOLN
425.5000 mg | Freq: Once | INTRAVENOUS | Status: AC
  Administered 2022-05-30: 450 mg via INTRAVENOUS
  Filled 2022-05-30: qty 45

## 2022-05-30 NOTE — Progress Notes (Signed)
[facility] [phone]   [phone]  OFFICE PROGRESS NOTE  [address], [place] [address]  DIAGNOSIS: Metastatic non-small cell lung cancer, adenocarcinoma that was initially diagnosed as stage IIb (T1b, N1, M0) non-small cell lung cancer, adenocarcinoma presented with right upper lobe lung nodule  Detected Alteration(s) / Biomarker(s) Associated FDA-approved therapies Clinical Trial Availability % cfDNA or Amplification BRAF V600E approved by FDA Dabrafenib+trametinib, Encorafenib+binimetinib approved in other indication Cobimetinib, Dabrafenib, Trametinib, Vemurafenib, Vemurafenib+cobimetinib Yes 8.3%  BRCA1 R252S None (VUS) None (VUS) 0.1%   SMAD4 D416fs None None 7.2%  PD-L1 expression 35%  PRIOR THERAPY:  1) Status post right upper lobectomy with lymph node dissection on September 30, 2019 in [place].  She declines adjuvant systemic chemotherapy. 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 Lisbeth Renshaw.  CURRENT THERAPY: Systemic chemotherapy with carboplatin for AUC of 5, Alimta 500 Mg/M2 and Keytruda 200 Mg IV every 3 weeks.  First dose April 16, 2022.  Status post 2 cycles.  INTERVAL HISTORY: Brandi Holmes 72 y.o. female returns to the clinic today for follow-up visit.  The patient is feeling fine today with no concerning complaints except for some aching pain and phlebitis in her left upper extremity.  She has no current chest pain but has shortness of breath with exertion with no cough or hemoptysis.  She has no nausea, vomiting, diarrhea or constipation.  She has no headache or visual changes.  She recently underwent T8 and T9 intercostal nerve blocks by interventional radiology.  She is feeling a little bit better but not complete resolution.  She has been tolerating her treatment with  chemotherapy fairly well.  She is here for evaluation and repeat blood work before starting cycle #3.  MEDICAL HISTORY: Past Medical History:  Diagnosis Date   Anemia    as a child only   Coronary artery calcification of native artery    Hyperlipidemia    Hypertension    Liver spots    " per pt"   Lung nodule    Wears glasses    Wears partial dentures     ALLERGIES:  is allergic to crab [shellfish allergy] and morphine and related.  MEDICATIONS:  Current Outpatient Medications  Medication Sig Dispense Refill   furosemide (LASIX) 20 MG tablet Take 20 mg by mouth daily.     amLODipine (NORVASC) 5 MG tablet Take 1 tablet (5 mg total) by mouth every evening. (Patient taking differently: Take 5 mg by mouth at bedtime.) 90 tablet 0   aspirin EC 81 MG tablet Take 81 mg by mouth at bedtime. Swallow whole.     atorvastatin (LIPITOR) 20 MG tablet Take 1 tablet (20 mg total) by mouth at bedtime. 90 tablet 0   calcium carbonate (OSCAL) 1500 (600 Ca) MG TABS tablet Take 600 mg of elemental calcium by mouth daily. When able to remember     clobetasol ointment (TEMOVATE) 1.47 % Apply 1 application  topically as needed (rash).     folic acid (FOLVITE) 1 MG tablet Take 1 tablet (1 mg total) by mouth daily. 30 tablet 4   gabapentin (NEURONTIN) 600 MG tablet Take 600 mg by mouth 3 (three) times daily. (Patient not taking: Reported on 05/20/2022)     lisinopril (ZESTRIL) 20 MG tablet Take 20 mg by mouth in the morning.  MAGNESIUM PO Take 1 tablet by mouth daily. When able to remember     nystatin-triamcinolone ointment (MYCOLOG) Apply 1 application  topically daily.     oxybutynin (DITROPAN) 5 MG tablet Take 5 mg by mouth as needed for bladder spasms.     potassium chloride SA (KLOR-CON M) 20 MEQ tablet Take by mouth daily. Take 1 tablet a day with Calcium and Magnesium when she can remember for leg cramps     pregabalin (LYRICA) 25 MG capsule Take 50 mg by mouth 2 (two) times daily.      prochlorperazine (COMPAZINE) 10 MG tablet Take 1 tablet (10 mg total) by mouth every 6 (six) hours as needed for nausea or vomiting. (Patient not taking: Reported on 05/20/2022) 30 tablet 0   tiZANidine (ZANAFLEX) 2 MG tablet Take 2 mg by mouth at bedtime.     traMADol (ULTRAM) 50 MG tablet Take 50 mg by mouth every 6 (six) hours as needed for severe pain. Every 8 hours     No current facility-administered medications for this visit.    SURGICAL HISTORY:  Past Surgical History:  Procedure Laterality Date   ABDOMINAL HYSTERECTOMY  1974   heavy bleeding   ABDOMINAL HYSTERECTOMY     BRONCHIAL BRUSHINGS  08/13/2019   Procedure: BRONCHIAL BRUSHINGS;  Surgeon: Garner Nash, DO;  Location: [facility];  Service: Pulmonary;;   BRONCHIAL WASHINGS  08/13/2019   Procedure: BRONCHIAL WASHINGS;  Surgeon: Garner Nash, DO;  Location: [facility];  Service: Pulmonary;;   COLONOSCOPY W/ BIOPSIES AND POLYPECTOMY     DILATION AND CURETTAGE OF UTERUS     FINE NEEDLE ASPIRATION  08/13/2019   Procedure: FINE NEEDLE ASPIRATION (FNA) LINEAR;  Surgeon: Garner Nash, DO;  Location: [facility];  Service: Pulmonary;;   FOOT SURGERY     bilateral bunions and hammer toes   IR RADIOLOGIST EVAL & MGMT  05/20/2022   LUNG BIOPSY  08/13/2019   Procedure: LUNG BIOPSY;  Surgeon: Garner Nash, DO;  Location: [facility];  Service: Pulmonary;;   MULTIPLE TOOTH EXTRACTIONS     VIDEO BRONCHOSCOPY WITH ENDOBRONCHIAL NAVIGATION Right 08/13/2019   Procedure: VIDEO BRONCHOSCOPY WITH ENDOBRONCHIAL NAVIGATION;  Surgeon: Garner Nash, DO;  Location: [facility];  Service: Pulmonary;  Laterality: Right;   VIDEO BRONCHOSCOPY WITH ENDOBRONCHIAL ULTRASOUND Right 08/13/2019   Procedure: VIDEO BRONCHOSCOPY WITH ENDOBRONCHIAL ULTRASOUND;  Surgeon: Garner Nash, DO;  Location: [facility];  Service: Pulmonary;  Laterality: Right;    REVIEW OF SYSTEMS:  A comprehensive review of systems was negative except for:  Constitutional: positive for fatigue Musculoskeletal: positive for back pain   PHYSICAL EXAMINATION: General appearance: alert, cooperative, fatigued, and no distress Head: Normocephalic, without obvious abnormality, atraumatic Neck: no adenopathy, no JVD, supple, symmetrical, trachea midline, and thyroid not enlarged, symmetric, no tenderness/mass/nodules Lymph nodes: Cervical, supraclavicular, and axillary nodes normal. Resp: clear to auscultation bilaterally Back: symmetric, no curvature. ROM normal. No CVA tenderness. Cardio: regular rate and rhythm, S1, S2 normal, no murmur, click, rub or gallop GI: soft, non-tender; bowel sounds normal; no masses,  no organomegaly Extremities: extremities normal, atraumatic, no cyanosis or edema Neurologic: Alert and oriented X 3, normal strength and tone. Normal symmetric reflexes. Normal coordination and gait  ECOG PERFORMANCE STATUS: 1 - Symptomatic but completely ambulatory  Blood pressure (!) 99/55, pulse 78, temperature 98.3 F (36.8 C), temperature source Oral, resp. rate 16, weight 163 lb 12.8 oz (74.3 kg), SpO2 98 %.  LABORATORY DATA: Lab Results  Component Value Date   WBC 6.4 05/30/2022   HGB 10.4 (L) 05/30/2022   HCT 32.6 (L) 05/30/2022   MCV 94.2 05/30/2022   PLT 278 05/30/2022      Chemistry      Component Value Date/Time   NA 140 05/30/2022 1128   NA 142 07/22/2019 1522   K 4.2 05/30/2022 1128   CL 108 05/30/2022 1128   CO2 24 05/30/2022 1128   BUN 12 05/30/2022 1128   BUN 13 07/22/2019 1522   CREATININE 0.91 05/30/2022 1128   CREATININE 0.91 09/16/2011 1000      Component Value Date/Time   CALCIUM 9.1 05/30/2022 1128   ALKPHOS 82 05/30/2022 1128   AST 17 05/30/2022 1128   ALT 9 05/30/2022 1128   BILITOT 0.4 05/30/2022 1128       RADIOGRAPHIC STUDIES: DG Chest 2 View  Result Date: 05/22/2022 CLINICAL DATA:  Status post intercostal block EXAM: CHEST - 2 VIEW COMPARISON:  03/06/2022 FINDINGS: Cardiomediastinal  silhouette and pulmonary vasculature are within normal limits. Postsurgical changes of the right upper lobe are again seen. Minimal strandy opacities at the right lung base likely due to atelectasis. Lungs otherwise clear. No pneumothorax. Advanced degenerative changes of the right glenohumeral joint. IMPRESSION: 1. No acute cardiopulmonary process. 2. No pneumothorax. Electronically Signed   By: Miachel Roux M.D.   On: 05/22/2022 15:48   US Guided Needle Placement  Result Date: 05/22/2022 INDICATION: 72 year old woman with history of metastatic non-small cell lung cancer treated with right upper lobectomy and lymph node dissection on 09/30/2019. She is progressed to osseous metastatic disease with involvement of the T9 vertebral body and is adjacent right rib. Despite palliative treatment to the metastatic lesion, she continues to have persistent severe right posterolateral chest pain extending along the rib, just below her right breast. She presents to interventional radiology today for ultrasound-guided intercostal nerve block. EXAM: Ultrasound-guided T8 and T9 intercostal nerve block MEDICATIONS: None. ANESTHESIA/SEDATION: None COMPLICATIONS: None immediate. PROCEDURE: Informed written consent was obtained from the patient after a thorough discussion of the procedural risks, benefits and alternatives. All questions were addressed. Maximal Sterile Barrier Technique was utilized including caps, mask, sterile gowns, sterile gloves, sterile drape, hand hygiene and skin antiseptic. A timeout was performed prior to the initiation of the procedure. Patient was positioned prone on the procedure table. The T9 vertebral body was localized with fluoroscopy. The site was marked and prepped with Betadine. Following local lidocaine administration, 22 gauge needle was advanced into the T9-T10 intercostal space utilizing continuous ultrasound guidance. 3 mL of 0.5% bupivacaine and 40 mg of Depo-Medrol was injected into the  T9-T10 intracostal space. Following local lidocaine administration, 22 gauge needle was advanced into the T8-T9 intracostal space utilizing continuous ultrasound guidance. 3 mL of 0.5% bupivacaine and 40 mg of Depo-Medrol was injected into the T8-T9 intercostal space. Patient on the procedures well without immediate complication. Postprocedure radiograph showed no pneumothorax. IMPRESSION: T8 and T9 intercostal nerve blocks. Electronically Signed   By: Miachel Roux M.D.   On: 05/22/2022 15:46   IR Radiologist Eval & Mgmt  Result Date: 05/20/2022 EXAM: NEW PATIENT OFFICE VISIT CHIEF COMPLAINT: SEE EPIC NOTE HISTORY OF PRESENT ILLNESS: SEE EPIC NOTE REVIEW OF SYSTEMS: SEE EPIC NOTE PHYSICAL EXAMINATION: SEE EPIC NOTE ASSESSMENT AND PLAN: SEE EPIC NOTE Electronically Signed   By: Jacqulynn Cadet M.D.   On: 05/20/2022 14:14     ASSESSMENT AND PLAN: This is a pleasant 72 years old African-American female diagnosed with  metastatic also lung cancer, adenocarcinoma initially diagnosed as stage IIb (T1b, N1, M0) non-small cell lung cancer, adenocarcinoma status post a right upper lobectomy with lymph node dissection on September 30, 2019 in [place].  She declined adjuvant systemic chemotherapy at that time. She was found to have evidence for disease metastasis in November 2023 presenting with multiple metastatic bone lesions. The patient had molecular studies that showed an actionable mutation with BRAF V600E mutation and PD-L1 expression of 35%. She underwent palliative radiotherapy to the T9 vertebral lesion under the care of Dr. Lisbeth Renshaw. The patient is currently undergoing first-line combination of systemic chemotherapy with carboplatin for AUC of 5, Alimta 500 Mg/M2 and Keytruda 200 Mg IV every 3 weeks.  Status post 2 cycles.   The patient is feeling fine today with no concerning complaints except for fatigue and the intermittent low back pain. She recently underwent nerve block to T8 and T9 intercostal nerves. I  recommended for her to proceed with cycle #3 of her treatment today as planned. I will see her back for follow-up visit in 3 weeks for evaluation with repeat CT scan of the chest, abdomen and pelvis for restaging of her disease. The patient was advised to call immediately if she has any other concerning symptoms in the interval. The patient voices understanding of current disease status and treatment options and is in agreement with the current care plan. The total time spent in the appointment was 20 minutes.  All questions were answered. The patient knows to call the clinic with any problems, questions or concerns. We can certainly see the patient much sooner if necessary.  Disclaimer: This note was dictated with voice recognition software. Similar sounding words can inadvertently be transcribed and may not be corrected upon review.

## 2022-05-30 NOTE — Progress Notes (Signed)
At 1315 patient C/O pain at her IV site on R forearm, no edema or redness noted, site is very tender to touch.  Carboplatin infusing, paused at this time.  IV removed.  New IV started L hand, 24 gauge, good blood return noted.  Carboplatin restarted, patient denies any pain, new site WNL.

## 2022-05-30 NOTE — Patient Instructions (Signed)
[facility]  Discharge Instructions: Thank you for choosing [facility] to provide your oncology and hematology care.   If you have a lab appointment with the [facility], please go directly to the [facility] and check in at the registration area.   Wear comfortable clothing and clothing appropriate for easy access to any Portacath or PICC line.   We strive to give you quality time with your provider. You may need to reschedule your appointment if you arrive late (15 or more minutes).  Arriving late affects you and other patients whose appointments are after yours.  Also, if you miss three or more appointments without notifying the office, you may be dismissed from the clinic at the provider's discretion.      For prescription refill requests, have your pharmacy contact our office and allow 72 hours for refills to be completed.    Today you received the following chemotherapy and/or immunotherapy agents Keytruda, Alimta, Carboplatin      To help prevent nausea and vomiting after your treatment, we encourage you to take your nausea medication as directed.  BELOW ARE SYMPTOMS THAT SHOULD BE REPORTED IMMEDIATELY: *FEVER GREATER THAN 100.4 F (38 C) OR HIGHER *CHILLS OR SWEATING *NAUSEA AND VOMITING THAT IS NOT CONTROLLED WITH YOUR NAUSEA MEDICATION *UNUSUAL SHORTNESS OF BREATH *UNUSUAL BRUISING OR BLEEDING *URINARY PROBLEMS (pain or burning when urinating, or frequent urination) *BOWEL PROBLEMS (unusual diarrhea, constipation, pain near the anus) TENDERNESS IN MOUTH AND THROAT WITH OR WITHOUT PRESENCE OF ULCERS (sore throat, sores in mouth, or a toothache) UNUSUAL RASH, SWELLING OR PAIN  UNUSUAL VAGINAL DISCHARGE OR ITCHING   Items with * indicate a potential emergency and should be followed up as soon as possible or go to the Emergency Department if any problems should occur.  Please show the CHEMOTHERAPY ALERT CARD or IMMUNOTHERAPY  ALERT CARD at check-in to the Emergency Department and triage nurse.  Should you have questions after your visit or need to cancel or reschedule your appointment, please contact [facility]  Dept: [phone]  and follow the prompts.  Office hours are 8:00 a.m. to 4:30 p.m. Monday - Friday. Please note that voicemails left after 4:00 p.m. may not be returned until the following business day.  We are closed weekends and major holidays. You have access to a nurse at all times for urgent questions. Please call the main number to the clinic Dept: [phone] and follow the prompts.   For any non-urgent questions, you may also contact your provider using MyChart. We now offer e-Visits for anyone 34 and older to request care online for non-urgent symptoms. For details visit [email or web address].   Also download the MyChart app! Go to the app store, search "MyChart", open the app, select [facility], and log in with your MyChart username and password.

## 2022-05-30 NOTE — Progress Notes (Signed)
Patient called after receiving my card per my request.  Introduced myself as Arboriculturist and to offer available resources.  Discussed one-time $1000 Radio broadcast assistant to assist with personal expenses while going through treatment. Advised what is needed to apply. She will bring at next appointment on 06/06/22 and be given paperwork to complete. Briefly went over some expenses it will help cover. Advised her to contact me at earliest convenience after appointment to discuss expense sheet in detail.  She has my card to do so and for any additional financial questions or concerns.

## 2022-06-01 LAB — T4: T4, Total: 6.3 ug/dL (ref 4.5–12.0)

## 2022-06-06 ENCOUNTER — Encounter: Payer: Self-pay | Admitting: Internal Medicine

## 2022-06-06 ENCOUNTER — Inpatient Hospital Stay: Payer: Medicare HMO

## 2022-06-06 DIAGNOSIS — Z5111 Encounter for antineoplastic chemotherapy: Secondary | ICD-10-CM | POA: Diagnosis not present

## 2022-06-06 DIAGNOSIS — C7951 Secondary malignant neoplasm of bone: Secondary | ICD-10-CM

## 2022-06-06 LAB — CBC WITH DIFFERENTIAL/PLATELET
Abs Immature Granulocytes: 0.02 10*3/uL (ref 0.00–0.07)
Basophils Absolute: 0 10*3/uL (ref 0.0–0.1)
Basophils Relative: 1 %
Eosinophils Absolute: 0.1 10*3/uL (ref 0.0–0.5)
Eosinophils Relative: 2 %
HCT: 32.7 % — ABNORMAL LOW (ref 36.0–46.0)
Hemoglobin: 10.6 g/dL — ABNORMAL LOW (ref 12.0–15.0)
Immature Granulocytes: 0 %
Lymphocytes Relative: 23 %
Lymphs Abs: 1.2 10*3/uL (ref 0.7–4.0)
MCH: 30.1 pg (ref 26.0–34.0)
MCHC: 32.4 g/dL (ref 30.0–36.0)
MCV: 92.9 fL (ref 80.0–100.0)
Monocytes Absolute: 0.5 10*3/uL (ref 0.1–1.0)
Monocytes Relative: 9 %
Neutro Abs: 3.3 10*3/uL (ref 1.7–7.7)
Neutrophils Relative %: 65 %
Platelets: 227 10*3/uL (ref 150–400)
RBC: 3.52 MIL/uL — ABNORMAL LOW (ref 3.87–5.11)
RDW: 16 % — ABNORMAL HIGH (ref 11.5–15.5)
WBC: 5.1 10*3/uL (ref 4.0–10.5)
nRBC: 0 % (ref 0.0–0.2)

## 2022-06-06 LAB — COMPREHENSIVE METABOLIC PANEL
ALT: 20 U/L (ref 0–44)
AST: 29 U/L (ref 15–41)
Albumin: 3.6 g/dL (ref 3.5–5.0)
Alkaline Phosphatase: 77 U/L (ref 38–126)
Anion gap: 10 (ref 5–15)
BUN: 21 mg/dL (ref 8–23)
CO2: 23 mmol/L (ref 22–32)
Calcium: 8.8 mg/dL — ABNORMAL LOW (ref 8.9–10.3)
Chloride: 106 mmol/L (ref 98–111)
Creatinine, Ser: 1.01 mg/dL — ABNORMAL HIGH (ref 0.44–1.00)
GFR, Estimated: 58 mL/min — ABNORMAL LOW (ref >60)
Glucose, Bld: 109 mg/dL — ABNORMAL HIGH (ref 70–99)
Potassium: 3.7 mmol/L (ref 3.5–5.1)
Sodium: 139 mmol/L (ref 135–145)
Total Bilirubin: 0.7 mg/dL (ref 0.3–1.2)
Total Protein: 7.1 g/dL (ref 6.5–8.1)

## 2022-06-06 NOTE — Progress Notes (Signed)
Patient provided documents for grant at check-in.  Patient approved for one-time $1000 [employer] grant to assist with personal expenses while going through treatment. She has a copy of the approval letter and expense sheet along with the Outpatient pharmacy information. She received a gift card today. Advised to use as credit and it is already activated and not to use at pump for gas. She verbalized understanding.  She has my card to contact at earliest convenience to discuss expenses in detail and for any additional financial questions or concerns.

## 2022-06-13 ENCOUNTER — Other Ambulatory Visit: Payer: Self-pay

## 2022-06-13 ENCOUNTER — Inpatient Hospital Stay: Payer: Medicare HMO

## 2022-06-13 DIAGNOSIS — Z5111 Encounter for antineoplastic chemotherapy: Secondary | ICD-10-CM | POA: Diagnosis not present

## 2022-06-13 DIAGNOSIS — C7951 Secondary malignant neoplasm of bone: Secondary | ICD-10-CM

## 2022-06-13 LAB — CBC WITH DIFFERENTIAL/PLATELET
Abs Immature Granulocytes: 0.02 10*3/uL (ref 0.00–0.07)
Basophils Absolute: 0 10*3/uL (ref 0.0–0.1)
Basophils Relative: 0 %
Eosinophils Absolute: 0.1 10*3/uL (ref 0.0–0.5)
Eosinophils Relative: 1 %
HCT: 28.9 % — ABNORMAL LOW (ref 36.0–46.0)
Hemoglobin: 9.3 g/dL — ABNORMAL LOW (ref 12.0–15.0)
Immature Granulocytes: 0 %
Lymphocytes Relative: 22 %
Lymphs Abs: 1 10*3/uL (ref 0.7–4.0)
MCH: 30.8 pg (ref 26.0–34.0)
MCHC: 32.2 g/dL (ref 30.0–36.0)
MCV: 95.7 fL (ref 80.0–100.0)
Monocytes Absolute: 0.7 10*3/uL (ref 0.1–1.0)
Monocytes Relative: 16 %
Neutro Abs: 2.7 10*3/uL (ref 1.7–7.7)
Neutrophils Relative %: 61 %
Platelets: 90 10*3/uL — ABNORMAL LOW (ref 150–400)
RBC: 3.02 MIL/uL — ABNORMAL LOW (ref 3.87–5.11)
RDW: 16.4 % — ABNORMAL HIGH (ref 11.5–15.5)
WBC: 4.6 10*3/uL (ref 4.0–10.5)
nRBC: 0 % (ref 0.0–0.2)

## 2022-06-13 LAB — COMPREHENSIVE METABOLIC PANEL
ALT: 19 U/L (ref 0–44)
AST: 24 U/L (ref 15–41)
Albumin: 3.4 g/dL — ABNORMAL LOW (ref 3.5–5.0)
Alkaline Phosphatase: 76 U/L (ref 38–126)
Anion gap: 7 (ref 5–15)
BUN: 10 mg/dL (ref 8–23)
CO2: 25 mmol/L (ref 22–32)
Calcium: 8.6 mg/dL — ABNORMAL LOW (ref 8.9–10.3)
Chloride: 109 mmol/L (ref 98–111)
Creatinine, Ser: 0.78 mg/dL (ref 0.44–1.00)
GFR, Estimated: >60 mL/min (ref >60)
Glucose, Bld: 72 mg/dL (ref 70–99)
Potassium: 4 mmol/L (ref 3.5–5.1)
Sodium: 141 mmol/L (ref 135–145)
Total Bilirubin: 0.4 mg/dL (ref 0.3–1.2)
Total Protein: 6.1 g/dL — ABNORMAL LOW (ref 6.5–8.1)

## 2022-06-17 ENCOUNTER — Ambulatory Visit (HOSPITAL_COMMUNITY)
Admission: RE | Admit: 2022-06-17 | Discharge: 2022-06-17 | Disposition: A | Discharge status: Home / Self Care | Payer: Medicare HMO | Source: Ambulatory Visit | Attending: Internal Medicine | Admitting: Internal Medicine

## 2022-06-17 DIAGNOSIS — C3490 Malignant neoplasm of unspecified part of unspecified bronchus or lung: Secondary | ICD-10-CM | POA: Diagnosis not present

## 2022-06-17 MED ORDER — IOHEXOL 300 MG/ML  SOLN
100.0000 mL | Freq: Once | INTRAMUSCULAR | Status: AC | PRN
  Administered 2022-06-17: 100 mL via INTRAVENOUS

## 2022-06-17 NOTE — Progress Notes (Unsigned)
[place], [place], [place] [address]  DIAGNOSIS: Metastatic non-small cell lung cancer, adenocarcinoma that was initially diagnosed as stage IIb (T1b, N1, M0) non-small cell lung cancer, adenocarcinoma presented with right upper lobe lung nodule.  She was initially diagnosed in June 2021 as a stage IIb (T1b, N1, M0) she had metastatic disease to the bones diagnosed in November 2023.     Detected Alteration(s) / Biomarker(s) Associated FDA-approved therapies         Clinical Trial Availability          % cfDNA or Amplification BRAF V600E approved by FDA Dabrafenib+trametinib, Encorafenib+binimetinib approved in other indication Cobimetinib, Dabrafenib, Trametinib, Vemurafenib, Vemurafenib+cobimetinib Yes      8.3%   BRCA1 R252S None (VUS) None (VUS) 0.1%     SMAD4 D437f None None 7.2%   PD-L1 expression 35%  PRIOR THERAPY:  1) Status post right upper lob[place]  She declines adjuvant systemic chemotherapy. 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 MLisbeth Renshaw  CURRENT THERAPY: Systemic chemotherapy with carboplatin for AUC of 5, Alimta 500 Mg/M2 and Keytruda 200 Mg IV every 3 weeks.  First dose April 16, 2022.  Status post 3 cycles.   INTERVAL HISTORY: Brandi Holmes 72y.o. female returns to the clinic today for a follow-up visit. The patient was last seen 3 weeks ago by Dr. MJulien Nordmann  The patient was diagnosed with disease recurrence in December 2023.  Therefore, she is currently undergoing palliative systemic chemotherapy and immunotherapy.  Thus far she is tolerating treatment well without any adverse side effects.  Although she has been having some ongoing back pain since she was found to have evidence of disease recurrence.  She saw  interventional radiology last month for a intercostal nerve block at T8 and T9.  She did not notice an appreciable improvement in her pain. She also has some right rib pain since her surgery a few years ago that wraps around to the antioer right chest. She sleeps with a heating pad. She has not tried topical patches. She is undergoing pain management by her PCP with tramadol due to intolerance to other pain medications. She has tenderness to light palpation over the back and ribs in this area. She also has some skin darkening from her prior radiation at this site.   The patient denies any fever, chills, night sweats, or unexplained weight loss. She denies any shortness of breath, cough, or hemoptysis. She reports some ongoing fatigue. She reports due to her weakness, she is only able to ambulate about 25-30 feet. She states her PCP is working on getting her an upright walker for when she goes out in public. She declined DME order for other walker or cane. She denies any nausea, vomiting, or diarrhea.  The patient sometimes experiences constipation due to her tramadol. She uses magnesium citrate. Denies any headache or visual changes.  Denies any rashes or skin changes.  He is following closely with the financial advocate for enrollment in the [employer]  She recently had a restaging CT scan performed.  She is here today for evaluation to review her scan results before starting cycle #4.    MEDICAL HISTORY: Past Medical History:  Diagnosis Date   Anemia    as a child only  Coronary artery calcification of native artery    Hyperlipidemia    Hypertension    Liver spots    " per pt"   Lung nodule    Wears glasses    Wears partial dentures     ALLERGIES:  is allergic to crab [shellfish allergy] and morphine and related.  MEDICATIONS:  Current Outpatient Medications  Medication Sig Dispense Refill   aspirin EC 81 MG tablet Take 81 mg by mouth at bedtime. Swallow whole.     calcium carbonate  (OSCAL) 1500 (600 Ca) MG TABS tablet Take 600 mg of elemental calcium by mouth daily. When able to remember     clobetasol ointment (TEMOVATE) [id number] % Apply 1 application  topically as needed (rash).     folic acid (FOLVITE) 1 MG tablet Take 1 tablet (1 mg total) by mouth daily. 30 tablet 4   lidocaine (LIDODERM) 5 % Place 1 patch onto the skin daily. Remove & Discard patch within 12 hours or as directed by MD 30 patch 0   lisinopril (ZESTRIL) 20 MG tablet Take 20 mg by mouth in the morning.     magnesium citrate SOLN Take 1 Bottle by mouth once.     MAGNESIUM PO Take 1 tablet by mouth daily. When able to remember     nystatin-triamcinolone ointment (MYCOLOG) Apply 1 application  topically daily.     oxybutynin (DITROPAN) 5 MG tablet Take 5 mg by mouth as needed for bladder spasms.     potassium chloride SA (KLOR-CON M) 20 MEQ tablet Take by mouth daily. Take 1 tablet a day with Calcium and Magnesium when she can remember for leg cramps     pregabalin (LYRICA) 100 MG capsule Take 100 mg by mouth 2 (two) times daily.     prochlorperazine (COMPAZINE) 10 MG tablet Take 1 tablet (10 mg total) by mouth every 6 (six) hours as needed for nausea or vomiting. 30 tablet 0   tiZANidine (ZANAFLEX) 2 MG tablet Take 2 mg by mouth at bedtime.     traMADol (ULTRAM) 50 MG tablet Take 50 mg by mouth every 6 (six) hours as needed for severe pain. Every 8 hours     atorvastatin (LIPITOR) 20 MG tablet Take 1 tablet (20 mg total) by mouth at bedtime. 90 tablet 0   No current facility-administered medications for this visit.    SURGICAL HISTORY:  Past Surgical History:  Procedure Laterality Date   ABDOMINAL HYSTERECTOMY  1974   heavy bleeding   ABDOMINAL HYSTERECTOMY     BRONCHIAL BRUSHINGS  08/13/2019   Procedure: BRONCHIAL BRUSHINGS;  Surgeon: Garner Nash, DO;  Location: [facility];  Service: Pulmonary;;   BRONCHIAL WASHINGS  08/13/2019   Procedure: BRONCHIAL WASHINGS;  Surgeon: Garner Nash, DO;   Location: [facility];  Service: Pulmonary;;   COLONOSCOPY W/ BIOPSIES AND POLYPECTOMY     DILATION AND CURETTAGE OF UTERUS     FINE NEEDLE ASPIRATION  08/13/2019   Procedure: FINE NEEDLE ASPIRATION (FNA) LINEAR;  Surgeon: Garner Nash, DO;  Location: [facility];  Service: Pulmonary;;   FOOT SURGERY     bilateral bunions and hammer toes   IR RADIOLOGIST EVAL & MGMT  05/20/2022   LUNG BIOPSY  08/13/2019   Procedure: LUNG BIOPSY;  Surgeon: Garner Nash, DO;  Location: [facility];  Service: Pulmonary;;   MULTIPLE TOOTH EXTRACTIONS     VIDEO BRONCHOSCOPY WITH ENDOBRONCHIAL NAVIGATION Right 08/13/2019   Procedure: VIDEO BRONCHOSCOPY WITH ENDOBRONCHIAL NAVIGATION;  Surgeon:  Garner Nash, DO;  Location: [facility];  Service: Pulmonary;  Laterality: Right;   VIDEO BRONCHOSCOPY WITH ENDOBRONCHIAL ULTRASOUND Right 08/13/2019   Procedure: VIDEO BRONCHOSCOPY WITH ENDOBRONCHIAL ULTRASOUND;  Surgeon: Garner Nash, DO;  Location: [facility];  Service: Pulmonary;  Laterality: Right;    REVIEW OF SYSTEMS:   Review of Systems  Constitutional: Positive for fatigue. Negative for appetite change, chills, fever and unexpected weight change.  HENT: Negative for mouth sores, nosebleeds, sore throat and trouble swallowing.  Eyes: Negative for eye problems and icterus.  Respiratory: Negative for cough, hemoptysis, shortness of breath and wheezing.   Cardiovascular: Positive for right rib pain and back pain.   Gastrointestinal: Negative for abdominal pain, constipation, diarrhea, nausea and vomiting.  Genitourinary: Negative for bladder incontinence, difficulty urinating, dysuria, frequency and hematuria.   Musculoskeletal: Positive for back pain/right anterior rib pain. Negative for gait problem, neck pain and neck stiffness.  Skin: Negative for itching and rash.  Neurological: Negative for dizziness, extremity weakness, gait problem, headaches, light-headedness and seizures.  Hematological:  Negative for adenopathy. Does not bruise/bleed easily.  Psychiatric/Behavioral: Negative for confusion, depression and sleep disturbance. The patient is not nervous/anxious.    PHYSICAL EXAMINATION:  Blood pressure 124/65, pulse 82, temperature 98.4 F (36.9 C), temperature source Oral, resp. rate 16, weight 167 lb 1.6 oz (75.8 kg), SpO2 97 %.  ECOG PERFORMANCE STATUS: 2  Physical Exam  Constitutional: Oriented to person, place, and time and well-developed, well-nourished, and in no distress. HENT:  Head: Normocephalic and atraumatic.  Mouth/Throat: Oropharynx is clear and moist. No oropharyngeal exudate.  Eyes: Conjunctivae are normal. Right eye exhibits no discharge. Left eye exhibits no discharge. No scleral icterus.  Neck: Normal range of motion. Neck supple.  Cardiovascular: Normal rate, regular rhythm, normal heart sounds and intact distal pulses.   Pulmonary/Chest: Effort normal and breath sounds normal. No respiratory distress. No wheezes. No rales.  Abdominal: Soft. Bowel sounds are normal. Exhibits no distension and no mass. There is no tenderness.  Musculoskeletal: Tenderness with minimal palpation to the thoracic spine. Normal range of motion. Exhibits no edema.  Lymphadenopathy:    No cervical adenopathy.  Neurological: Alert and oriented to person, place, and time. Exhibits normal muscle tone. Gait normal. Coordination normal.  Skin: Some skin darkening over prior radiation site. Skin is warm and dry. No rash noted. Not diaphoretic. No erythema. No pallor.  Psychiatric: Mood, memory and judgment normal.  Vitals reviewed.  LABORATORY DATA: Lab Results  Component Value Date   WBC 3.9 (L) 06/19/2022   HGB 8.8 (L) 06/19/2022   HCT 27.4 (L) 06/19/2022   MCV 95.5 06/19/2022   PLT 164 06/19/2022      Chemistry      Component Value Date/Time   NA 140 06/19/2022 1032   NA 142 07/22/2019 1522   K 3.7 06/19/2022 1032   CL 109 06/19/2022 1032   CO2 23 06/19/2022 1032    BUN 9 06/19/2022 1032   BUN 13 07/22/2019 1522   CREATININE 0.92 06/19/2022 1032   CREATININE 0.91 09/16/2011 1000      Component Value Date/Time   CALCIUM 8.8 (L) 06/19/2022 1032   ALKPHOS 70 06/19/2022 1032   AST 21 06/19/2022 1032   ALT 12 06/19/2022 1032   BILITOT 0.5 06/19/2022 1032       RADIOGRAPHIC STUDIES:  CT Chest W Contrast  Result Date: 06/18/2022 CLINICAL DATA:  Staging non-small-cell lung cancer. * Tracking Code: BO * EXAM: CT CHEST, ABDOMEN, AND  PELVIS WITH CONTRAST TECHNIQUE: Multidetector CT imaging of the chest, abdomen and pelvis was performed following the standard protocol during bolus administration of intravenous contrast. RADIATION DOSE REDUCTION: This exam was performed according to the departmental dose-optimization program which includes automated exposure control, adjustment of the mA and/or kV according to patient size and/or use of iterative reconstruction technique. CONTRAST:  162m OMNIPAQUE IOHEXOL 300 MG/ML  SOLN COMPARISON:  PET-CT scan 04/05/2022. Older chest CT scan, CT angiogram 03/06/2022 and abdomen pelvis CT from 03/06/2022. Older exams as well. FINDINGS: CT CHEST FINDINGS Cardiovascular: Heart is nonenlarged. No pericardial effusion. Coronary artery calcifications are seen. The thoracic aorta has a normal course and caliber with mild atherosclerotic calcified plaque. Bovine type aortic arch, normal variant. Mediastinum/Nodes: Normal caliber thoracic esophagus. Mild lower esophageal wall thickening. Please correlate with symptoms. Preserved thyroid gland. No specific abnormal lymph node enlargement identified in the axillary region, hilum or mediastinum. There are a few small lymph nodes in the mediastinum such as precarinal which are less than a cm in short axis and not pathologic by size criteria Lungs/Pleura: Left lung has diffuse interstitial septal thickening with scarring and fibrotic changes. There is some bronchiectasis along the medial left lower  lobe. No pneumothorax or effusion. The more confluence area of atelectasis along medial left lower lobe is increased from prior PET-CT scan. Stable 3 mm left apical nodule on series 6, image 31. The right lung has increasing parenchymal opacity along the right lower lobe greatest medially where there are confluence areas associated with bronchiectasis and interstitial thickening. Once again there is volume loss in the right hemithorax related to lung resection. Areas of peripheral interstitial septal thickening and fibrosis. No new right-sided lung dominant mass. No pneumothorax. Trace pleural fluid pleural fluid is increasing from previous. Musculoskeletal: Curvature and degenerative changes again seen along the spine. There are posterior right-sided rib deformities identified which could be related to old trauma or previous intervention. Sclerotic bone lesions are once again identified consistent with known osseous metastatic disease along the spine. These increasing in the upper thoracic spine region. There is also a destructive lytic lesion involving the spine at T9 and T8 consistent with additional area of osseous metastatic disease. These areas were hypermetabolic on PET-CT. CT ABDOMEN PELVIS FINDINGS Hepatobiliary: Multiple hepatic small cystic lesions are identified, benign-appearing no new space-occupying liver lesion. Gallbladder is nondilated. Patent portal vein. Pancreas: Unremarkable. No pancreatic ductal dilatation or surrounding inflammatory changes. Spleen: Normal in size without focal abnormality. Adrenals/Urinary Tract: Adrenal glands are preserved. No left-sided enhancing mass or collecting system dilatation. There is a focal lesion along the lower portion of the right kidney laterally. Once again this is concerning for malignant lesion and today has dimensions of 2.2 by 2.1 cm. Previously 2.2 cm, unchanged. The ureters have normal course and caliber extending down to the bladder. Preserved  contours of the urinary bladder. Stomach/Bowel: There is a redundant course of the sigmoid colon extending into the right upper quadrant. The large bowel otherwise has a normal caliber with moderate colonic stool. Normal appendix. Stomach is distended with fluid and debris. Small bowel is nondilated. Vascular/Lymphatic: Diffuse vascular calcifications identified along the aorta and branch vessels. Normal caliber aorta and IVC. There is significant plaque along the origin of the renal arteries. Please correlate for any evidence of renal artery hypertension. No specific abnormal lymph node enlargement identified in the abdomen and pelvis. Reproductive: Status post hysterectomy. No adnexal masses. Other: Anasarca.  No ascites.  No free air. Musculoskeletal: Multiple  areas of bony sclerosis, sclerotic metastatic lesions. Confluence large area involving L5. Few other areas as well such as L3 which appears to be increasing. A few other areas are increasing as well. Whole-body bone scan as clinically directed. IMPRESSION: Multifocal sclerotic bone metastases again identified. Overall these areas appear to be slightly increasing. If needed follow-up whole-body bone scan. Please correlate with MRI of 03/06/2022 as well. Surgical changes along the right lung from prior resection volume loss. Underlying chronic lung changes. There is some increase in tiny right pleural effusion. Is also increasing bilateral lower lung ill-defined parenchymal opacity and distortion. This could be posttreatment changes. Please correlate for any history of recent radiation to the thoracic spine. Stable aggressive appearing right renal mass. Electronically Signed   By: Jill Side M.D.   On: 06/18/2022 16:36   CT Abdomen Pelvis W Contrast  Result Date: 06/18/2022 CLINICAL DATA:  Staging non-small-cell lung cancer. * Tracking Code: BO * EXAM: CT CHEST, ABDOMEN, AND PELVIS WITH CONTRAST TECHNIQUE: Multidetector CT imaging of the chest, abdomen  and pelvis was performed following the standard protocol during bolus administration of intravenous contrast. RADIATION DOSE REDUCTION: This exam was performed according to the departmental dose-optimization program which includes automated exposure control, adjustment of the mA and/or kV according to patient size and/or use of iterative reconstruction technique. CONTRAST:  162m OMNIPAQUE IOHEXOL 300 MG/ML  SOLN COMPARISON:  PET-CT scan 04/05/2022. Older chest CT scan, CT angiogram 03/06/2022 and abdomen pelvis CT from 03/06/2022. Older exams as well. FINDINGS: CT CHEST FINDINGS Cardiovascular: Heart is nonenlarged. No pericardial effusion. Coronary artery calcifications are seen. The thoracic aorta has a normal course and caliber with mild atherosclerotic calcified plaque. Bovine type aortic arch, normal variant. Mediastinum/Nodes: Normal caliber thoracic esophagus. Mild lower esophageal wall thickening. Please correlate with symptoms. Preserved thyroid gland. No specific abnormal lymph node enlargement identified in the axillary region, hilum or mediastinum. There are a few small lymph nodes in the mediastinum such as precarinal which are less than a cm in short axis and not pathologic by size criteria Lungs/Pleura: Left lung has diffuse interstitial septal thickening with scarring and fibrotic changes. There is some bronchiectasis along the medial left lower lobe. No pneumothorax or effusion. The more confluence area of atelectasis along medial left lower lobe is increased from prior PET-CT scan. Stable 3 mm left apical nodule on series 6, image 31. The right lung has increasing parenchymal opacity along the right lower lobe greatest medially where there are confluence areas associated with bronchiectasis and interstitial thickening. Once again there is volume loss in the right hemithorax related to lung resection. Areas of peripheral interstitial septal thickening and fibrosis. No new right-sided lung dominant  mass. No pneumothorax. Trace pleural fluid pleural fluid is increasing from previous. Musculoskeletal: Curvature and degenerative changes again seen along the spine. There are posterior right-sided rib deformities identified which could be related to old trauma or previous intervention. Sclerotic bone lesions are once again identified consistent with known osseous metastatic disease along the spine. These increasing in the upper thoracic spine region. There is also a destructive lytic lesion involving the spine at T9 and T8 consistent with additional area of osseous metastatic disease. These areas were hypermetabolic on PET-CT. CT ABDOMEN PELVIS FINDINGS Hepatobiliary: Multiple hepatic small cystic lesions are identified, benign-appearing no new space-occupying liver lesion. Gallbladder is nondilated. Patent portal vein. Pancreas: Unremarkable. No pancreatic ductal dilatation or surrounding inflammatory changes. Spleen: Normal in size without focal abnormality. Adrenals/Urinary Tract: Adrenal glands are preserved.  No left-sided enhancing mass or collecting system dilatation. There is a focal lesion along the lower portion of the right kidney laterally. Once again this is concerning for malignant lesion and today has dimensions of 2.2 by 2.1 cm. Previously 2.2 cm, unchanged. The ureters have normal course and caliber extending down to the bladder. Preserved contours of the urinary bladder. Stomach/Bowel: There is a redundant course of the sigmoid colon extending into the right upper quadrant. The large bowel otherwise has a normal caliber with moderate colonic stool. Normal appendix. Stomach is distended with fluid and debris. Small bowel is nondilated. Vascular/Lymphatic: Diffuse vascular calcifications identified along the aorta and branch vessels. Normal caliber aorta and IVC. There is significant plaque along the origin of the renal arteries. Please correlate for any evidence of renal artery hypertension. No  specific abnormal lymph node enlargement identified in the abdomen and pelvis. Reproductive: Status post hysterectomy. No adnexal masses. Other: Anasarca.  No ascites.  No free air. Musculoskeletal: Multiple areas of bony sclerosis, sclerotic metastatic lesions. Confluence large area involving L5. Few other areas as well such as L3 which appears to be increasing. A few other areas are increasing as well. Whole-body bone scan as clinically directed. IMPRESSION: Multifocal sclerotic bone metastases again identified. Overall these areas appear to be slightly increasing. If needed follow-up whole-body bone scan. Please correlate with MRI of 03/06/2022 as well. Surgical changes along the right lung from prior resection volume loss. Underlying chronic lung changes. There is some increase in tiny right pleural effusion. Is also increasing bilateral lower lung ill-defined parenchymal opacity and distortion. This could be posttreatment changes. Please correlate for any history of recent radiation to the thoracic spine. Stable aggressive appearing right renal mass. Electronically Signed   By: Jill Side M.D.   On: 06/18/2022 16:36   DG Chest 2 View  Result Date: 05/22/2022 CLINICAL DATA:  Status post intercostal block EXAM: CHEST - 2 VIEW COMPARISON:  03/06/2022 FINDINGS: Cardiomediastinal silhouette and pulmonary vasculature are within normal limits. Postsurgical changes of the right upper lobe are again seen. Minimal strandy opacities at the right lung base likely due to atelectasis. Lungs otherwise clear. No pneumothorax. Advanced degenerative changes of the right glenohumeral joint. IMPRESSION: 1. No acute cardiopulmonary process. 2. No pneumothorax. Electronically Signed   By: Miachel Roux M.D.   On: 05/22/2022 15:48   US Guided Needle Placement  Result Date: 05/22/2022 INDICATION: 72 year old woman with history of metastatic non-small cell lung cancer treated with right upper lobectomy and lymph node dissection  on 09/30/2019. She is progressed to osseous metastatic disease with involvement of the T9 vertebral body and is adjacent right rib. Despite palliative treatment to the metastatic lesion, she continues to have persistent severe right posterolateral chest pain extending along the rib, just below her right breast. She presents to interventional radiology today for ultrasound-guided intercostal nerve block. EXAM: Ultrasound-guided T8 and T9 intercostal nerve block MEDICATIONS: None. ANESTHESIA/SEDATION: None COMPLICATIONS: None immediate. PROCEDURE: Informed written consent was obtained from the patient after a thorough discussion of the procedural risks, benefits and alternatives. All questions were addressed. Maximal Sterile Barrier Technique was utilized including caps, mask, sterile gowns, sterile gloves, sterile drape, hand hygiene and skin antiseptic. A timeout was performed prior to the initiation of the procedure. Patient was positioned prone on the procedure table. The T9 vertebral body was localized with fluoroscopy. The site was marked and prepped with Betadine. Following local lidocaine administration, 22 gauge needle was advanced into the T9-T10 intercostal space utilizing  continuous ultrasound guidance. 3 mL of 0.5% bupivacaine and 40 mg of Depo-Medrol was injected into the T9-T10 intracostal space. Following local lidocaine administration, 22 gauge needle was advanced into the T8-T9 intracostal space utilizing continuous ultrasound guidance. 3 mL of 0.5% bupivacaine and 40 mg of Depo-Medrol was injected into the T8-T9 intercostal space. Patient on the procedures well without immediate complication. Postprocedure radiograph showed no pneumothorax. IMPRESSION: T8 and T9 intercostal nerve blocks. Electronically Signed   By: Miachel Roux M.D.   On: 05/22/2022 15:46   IR Radiologist Eval & Mgmt  Result Date: 05/20/2022 EXAM: NEW PATIENT OFFICE VISIT CHIEF COMPLAINT: SEE EPIC NOTE HISTORY OF PRESENT ILLNESS:  SEE EPIC NOTE REVIEW OF SYSTEMS: SEE EPIC NOTE PHYSICAL EXAMINATION: SEE EPIC NOTE ASSESSMENT AND PLAN: SEE EPIC NOTE Electronically Signed   By: Jacqulynn Cadet M.D.   On: 05/20/2022 14:14     ASSESSMENT/PLAN:  This is a pleasant 72 year old African-American female diagnosed with metastatic also lung cancer, adenocarcinoma initially diagnosed as stage IIb (T1b, N1, M0) non-small cell lung cancer, adenocarcinoma status post a right upper lobectomy with lymph node dissection on September 30, 2019 in [place].  She declined adjuvant systemic chemotherapy at that time. She was found to have evidence for disease metastasis in November 2023 presenting with multiple metastatic bone lesions.   The patient had molecular studies that showed an actionable mutation with BRAF V600E mutation and PD-L1 expression of 35%.    She underwent palliative radiotherapy to the T9 vertebral lesion under the care of Dr. Lisbeth Renshaw. The patient is currently undergoing first-line combination of systemic chemotherapy with carboplatin for AUC of 5, Alimta 500 Mg/M2 and Keytruda 200 Mg IV every 3 weeks.  Status post 3 cycles.    The patient recently had a restaging CT scan performed.  The patient was seen with Dr. Julien Nordmann today.  Dr. Julien Nordmann personally independently reviewed the scan discussed results with the patient today.  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 There is no new or progressive disease findings.  Dr. Julien Nordmann recommends that she continue on cycle #4 as scheduled today.  Will see her back for follow-up visit in 3 weeks for evaluation repeat blood work before undergoing cycle #5.   She will continue to follow with her PCP for her pain management.  I previously offered her referral to palliative care team.  She previously underwent intercostal nerve block at T8 and T9.  I sent her a prescription to the [facility] for the Lidoderm patches. She has a few dollars left from the [facility].    Encouraged the patient to take a stool softener to prevent constipation.  She is on tramadol for her pain and likely needs to be on a bowel regimen with stool softener and laxatives to keep her bowels regular.  I offered DME order for a walker. She declined and stated her PCP was going to order her a red upright walker.   The patient was advised to call immediately if she has any concerning symptoms in the interval. The patient voices understanding of current disease status and treatment options and is in agreement with the current care plan. All questions were answered. The patient knows to call the clinic with any problems, questions or concerns. We can certainly see the patient much sooner if necessary  No orders of the defined types were placed in this encounter.   Lucella Pommier L Octavia Mottola, PA-C 06/19/22  ADDENDUM: Hematology/Oncology Attending: I had  a face-to-face encounter with the patient today.  I reviewed her records, lab, scan and recommended her care plan.  This is a very pleasant 72 years old African-American female with metastatic non-small cell lung cancer, adenocarcinoma that was initially diagnosed as stage IIb with evidence of metastatic disease in November 2023.  The patient has positive BRAF V600E and PD-L1 expression of 35%. She is currently undergoing palliative systemic chemotherapy with carboplatin for AUC of 5, Alimta 500 Mg/M2 and Keytruda 200 Mg IV every 3 weeks status post 3 cycles.  The patient has been tolerating this treatment well except for the back pain and she underwent ablation and nerve block in that area by interventional radiology. She had repeat CT scan of the chest, abdomen and pelvis performed recently.  I personally and independently reviewed the scan images and discussed the result with the patient today. Her scan showed no concerning findings for disease progression. I recommended for her to continue on her current treatment with the same  regimen and she will proceed with cycle #4 today. She will come back for follow-up visit in 3 weeks for evaluation before starting cycle #5 with maintenance treatment with Alimta and Keytruda every 3 weeks. If the patient has any evidence for disease progression in the future, I will treat her with a second line option with targeted therapy for the BRAF mutation. The patient was advised to call immediately if she has any other concerning symptoms in the interval. The total time spent in the appointment was 30 minutes. Disclaimer: This note was dictated with voice recognition software. Similar sounding words can inadvertently be transcribed and may be missed upon review. Eilleen Kempf, MD

## 2022-06-18 MED FILL — Dexamethasone Sodium Phosphate Inj 100 MG/10ML: INTRAMUSCULAR | Qty: 1 | Status: AC

## 2022-06-18 MED FILL — Fosaprepitant Dimeglumine For IV Infusion 150 MG (Base Eq): INTRAVENOUS | Qty: 5 | Status: AC

## 2022-06-19 ENCOUNTER — Inpatient Hospital Stay: Payer: Medicare HMO | Attending: Physician Assistant

## 2022-06-19 ENCOUNTER — Inpatient Hospital Stay: Payer: Medicare HMO

## 2022-06-19 ENCOUNTER — Other Ambulatory Visit (HOSPITAL_COMMUNITY): Payer: Self-pay

## 2022-06-19 ENCOUNTER — Inpatient Hospital Stay (HOSPITAL_BASED_OUTPATIENT_CLINIC_OR_DEPARTMENT_OTHER): Payer: Medicare HMO | Admitting: Physician Assistant

## 2022-06-19 ENCOUNTER — Encounter: Payer: Self-pay | Admitting: Internal Medicine

## 2022-06-19 ENCOUNTER — Telehealth: Payer: Self-pay | Admitting: Medical Oncology

## 2022-06-19 VITALS — BP 124/65 | HR 82 | Temp 98.4°F | Resp 16 | Wt 167.1 lb

## 2022-06-19 VITALS — BP 113/70 | HR 66 | Temp 98.2°F | Resp 14

## 2022-06-19 DIAGNOSIS — Z5111 Encounter for antineoplastic chemotherapy: Secondary | ICD-10-CM

## 2022-06-19 DIAGNOSIS — C7951 Secondary malignant neoplasm of bone: Secondary | ICD-10-CM

## 2022-06-19 DIAGNOSIS — C3411 Malignant neoplasm of upper lobe, right bronchus or lung: Secondary | ICD-10-CM | POA: Diagnosis not present

## 2022-06-19 DIAGNOSIS — Z5112 Encounter for antineoplastic immunotherapy: Secondary | ICD-10-CM | POA: Diagnosis not present

## 2022-06-19 LAB — CMP (CANCER CENTER ONLY)
ALT: 12 U/L (ref 0–44)
AST: 21 U/L (ref 15–41)
Albumin: 3.4 g/dL — ABNORMAL LOW (ref 3.5–5.0)
Alkaline Phosphatase: 70 U/L (ref 38–126)
Anion gap: 8 (ref 5–15)
BUN: 9 mg/dL (ref 8–23)
CO2: 23 mmol/L (ref 22–32)
Calcium: 8.8 mg/dL — ABNORMAL LOW (ref 8.9–10.3)
Chloride: 109 mmol/L (ref 98–111)
Creatinine: 0.92 mg/dL (ref 0.44–1.00)
GFR, Estimated: >60 mL/min (ref >60)
Glucose, Bld: 102 mg/dL — ABNORMAL HIGH (ref 70–99)
Potassium: 3.7 mmol/L (ref 3.5–5.1)
Sodium: 140 mmol/L (ref 135–145)
Total Bilirubin: 0.5 mg/dL (ref 0.3–1.2)
Total Protein: 6.5 g/dL (ref 6.5–8.1)

## 2022-06-19 LAB — CBC WITH DIFFERENTIAL (CANCER CENTER ONLY)
Abs Immature Granulocytes: 0.01 10*3/uL (ref 0.00–0.07)
Basophils Absolute: 0 10*3/uL (ref 0.0–0.1)
Basophils Relative: 1 %
Eosinophils Absolute: 0.1 10*3/uL (ref 0.0–0.5)
Eosinophils Relative: 3 %
HCT: 27.4 % — ABNORMAL LOW (ref 36.0–46.0)
Hemoglobin: 8.8 g/dL — ABNORMAL LOW (ref 12.0–15.0)
Immature Granulocytes: 0 %
Lymphocytes Relative: 32 %
Lymphs Abs: 1.3 10*3/uL (ref 0.7–4.0)
MCH: 30.7 pg (ref 26.0–34.0)
MCHC: 32.1 g/dL (ref 30.0–36.0)
MCV: 95.5 fL (ref 80.0–100.0)
Monocytes Absolute: 0.7 10*3/uL (ref 0.1–1.0)
Monocytes Relative: 19 %
Neutro Abs: 1.7 10*3/uL (ref 1.7–7.7)
Neutrophils Relative %: 45 %
Platelet Count: 164 10*3/uL (ref 150–400)
RBC: 2.87 MIL/uL — ABNORMAL LOW (ref 3.87–5.11)
RDW: 17.7 % — ABNORMAL HIGH (ref 11.5–15.5)
WBC Count: 3.9 10*3/uL — ABNORMAL LOW (ref 4.0–10.5)
nRBC: 0 % (ref 0.0–0.2)

## 2022-06-19 MED ORDER — SODIUM CHLORIDE 0.9 % IV SOLN
150.0000 mg | Freq: Once | INTRAVENOUS | Status: AC
  Administered 2022-06-19: 150 mg via INTRAVENOUS
  Filled 2022-06-19: qty 150

## 2022-06-19 MED ORDER — SODIUM CHLORIDE 0.9 % IV SOLN
200.0000 mg | Freq: Once | INTRAVENOUS | Status: AC
  Administered 2022-06-19: 200 mg via INTRAVENOUS
  Filled 2022-06-19: qty 8

## 2022-06-19 MED ORDER — SODIUM CHLORIDE 0.9 % IV SOLN
10.0000 mg | Freq: Once | INTRAVENOUS | Status: AC
  Administered 2022-06-19: 10 mg via INTRAVENOUS
  Filled 2022-06-19: qty 10

## 2022-06-19 MED ORDER — SODIUM CHLORIDE 0.9 % IV SOLN: Freq: Once | INTRAVENOUS | Status: AC

## 2022-06-19 MED ORDER — SODIUM CHLORIDE 0.9 % IV SOLN
425.5000 mg | Freq: Once | INTRAVENOUS | Status: AC
  Administered 2022-06-19: 450 mg via INTRAVENOUS
  Filled 2022-06-19: qty 45

## 2022-06-19 MED ORDER — LIDOCAINE 5 % EX PTCH
1.0000 | MEDICATED_PATCH | CUTANEOUS | 0 refills | Status: DC
  Filled 2022-06-19: qty 30, 30d supply, fill #0

## 2022-06-19 MED ORDER — PALONOSETRON HCL INJECTION 0.25 MG/5ML
0.2500 mg | Freq: Once | INTRAVENOUS | Status: AC
  Administered 2022-06-19: 0.25 mg via INTRAVENOUS
  Filled 2022-06-19: qty 5

## 2022-06-19 MED ORDER — SODIUM CHLORIDE 0.9 % IV SOLN
500.0000 mg/m2 | Freq: Once | INTRAVENOUS | Status: AC
  Administered 2022-06-19: 900 mg via INTRAVENOUS
  Filled 2022-06-19: qty 20

## 2022-06-19 NOTE — Progress Notes (Signed)
  Patient seen by PA today  Vitals are within treatment parameters.  Labs reviewed: and are within treatment parameters.  Per physician team, patient is ready for treatment and there are NO modifications to the treatment plan.

## 2022-06-19 NOTE — Telephone Encounter (Signed)
Faxed dental form with pertinent medical information.

## 2022-06-19 NOTE — Patient Instructions (Signed)
[facility]  Discharge Instructions: Thank you for choosing [facility] to provide your oncology and hematology care.   If you have a lab appointment with the [facility], please go directly to the [facility] and check in at the registration area.   Wear comfortable clothing and clothing appropriate for easy access to any Portacath or PICC line.   We strive to give you quality time with your provider. You may need to reschedule your appointment if you arrive late (15 or more minutes).  Arriving late affects you and other patients whose appointments are after yours.  Also, if you miss three or more appointments without notifying the office, you may be dismissed from the clinic at the provider's discretion.      For prescription refill requests, have your pharmacy contact our office and allow 72 hours for refills to be completed.    Today you received the following chemotherapy and/or immunotherapy agents: pembrolizumab, pemetrexed, and carboplatin      To help prevent nausea and vomiting after your treatment, we encourage you to take your nausea medication as directed.  BELOW ARE SYMPTOMS THAT SHOULD BE REPORTED IMMEDIATELY: *FEVER GREATER THAN 100.4 F (38 C) OR HIGHER *CHILLS OR SWEATING *NAUSEA AND VOMITING THAT IS NOT CONTROLLED WITH YOUR NAUSEA MEDICATION *UNUSUAL SHORTNESS OF BREATH *UNUSUAL BRUISING OR BLEEDING *URINARY PROBLEMS (pain or burning when urinating, or frequent urination) *BOWEL PROBLEMS (unusual diarrhea, constipation, pain near the anus) TENDERNESS IN MOUTH AND THROAT WITH OR WITHOUT PRESENCE OF ULCERS (sore throat, sores in mouth, or a toothache) UNUSUAL RASH, SWELLING OR PAIN  UNUSUAL VAGINAL DISCHARGE OR ITCHING   Items with * indicate a potential emergency and should be followed up as soon as possible or go to the Emergency Department if any problems should occur.  Please show the CHEMOTHERAPY ALERT CARD or  IMMUNOTHERAPY ALERT CARD at check-in to the Emergency Department and triage nurse.  Should you have questions after your visit or need to cancel or reschedule your appointment, please contact [facility]  Dept: [phone]  and follow the prompts.  Office hours are 8:00 a.m. to 4:30 p.m. Monday - Friday. Please note that voicemails left after 4:00 p.m. may not be returned until the following business day.  We are closed weekends and major holidays. You have access to a nurse at all times for urgent questions. Please call the main number to the clinic Dept: [phone] and follow the prompts.   For any non-urgent questions, you may also contact your provider using MyChart. We now offer e-Visits for anyone 49 and older to request care online for non-urgent symptoms. For details visit [email or web address].   Also download the MyChart app! Go to the app store, search "MyChart", open the app, select [facility], and log in with your MyChart username and password.

## 2022-06-20 ENCOUNTER — Telehealth: Payer: Self-pay | Admitting: Internal Medicine

## 2022-06-20 ENCOUNTER — Other Ambulatory Visit (HOSPITAL_COMMUNITY): Payer: Self-pay

## 2022-06-20 ENCOUNTER — Other Ambulatory Visit: Payer: Self-pay

## 2022-06-20 ENCOUNTER — Telehealth: Payer: Self-pay

## 2022-06-20 DIAGNOSIS — G893 Neoplasm related pain (acute) (chronic): Secondary | ICD-10-CM

## 2022-06-20 MED ORDER — LIDOCAINE 5 % EX PTCH: 1.0000 | MEDICATED_PATCH | CUTANEOUS | 0 refills | Status: DC

## 2022-06-20 NOTE — Telephone Encounter (Signed)
Notified Patient of prior authorization approval for Lidocaine 5% Patches. Medication is approved through 04/15/2023. No other needs or concerns voiced at this time.

## 2022-06-20 NOTE — Telephone Encounter (Signed)
Scheduled per 03/05 los, patient has been called and voicemail was left.

## 2022-06-27 ENCOUNTER — Inpatient Hospital Stay: Payer: Medicare HMO

## 2022-06-27 DIAGNOSIS — Z5111 Encounter for antineoplastic chemotherapy: Secondary | ICD-10-CM | POA: Diagnosis not present

## 2022-06-27 DIAGNOSIS — C7951 Secondary malignant neoplasm of bone: Secondary | ICD-10-CM

## 2022-06-27 LAB — CBC WITH DIFFERENTIAL/PLATELET
Abs Immature Granulocytes: 0.01 10*3/uL (ref 0.00–0.07)
Basophils Absolute: 0 10*3/uL (ref 0.0–0.1)
Basophils Relative: 1 %
Eosinophils Absolute: 0 10*3/uL (ref 0.0–0.5)
Eosinophils Relative: 1 %
HCT: 29.2 % — ABNORMAL LOW (ref 36.0–46.0)
Hemoglobin: 9.5 g/dL — ABNORMAL LOW (ref 12.0–15.0)
Immature Granulocytes: 0 %
Lymphocytes Relative: 34 %
Lymphs Abs: 1.2 10*3/uL (ref 0.7–4.0)
MCH: 31.4 pg (ref 26.0–34.0)
MCHC: 32.5 g/dL (ref 30.0–36.0)
MCV: 96.4 fL (ref 80.0–100.0)
Monocytes Absolute: 0.5 10*3/uL (ref 0.1–1.0)
Monocytes Relative: 13 %
Neutro Abs: 1.8 10*3/uL (ref 1.7–7.7)
Neutrophils Relative %: 51 %
Platelets: 189 10*3/uL (ref 150–400)
RBC: 3.03 MIL/uL — ABNORMAL LOW (ref 3.87–5.11)
RDW: 16.9 % — ABNORMAL HIGH (ref 11.5–15.5)
WBC: 3.6 10*3/uL — ABNORMAL LOW (ref 4.0–10.5)
nRBC: 0 % (ref 0.0–0.2)

## 2022-06-27 LAB — COMPREHENSIVE METABOLIC PANEL
ALT: 18 U/L (ref 0–44)
AST: 28 U/L (ref 15–41)
Albumin: 3.5 g/dL (ref 3.5–5.0)
Alkaline Phosphatase: 74 U/L (ref 38–126)
Anion gap: 8 (ref 5–15)
BUN: 17 mg/dL (ref 8–23)
CO2: 22 mmol/L (ref 22–32)
Calcium: 9 mg/dL (ref 8.9–10.3)
Chloride: 109 mmol/L (ref 98–111)
Creatinine, Ser: 1.25 mg/dL — ABNORMAL HIGH (ref 0.44–1.00)
GFR, Estimated: 45 mL/min — ABNORMAL LOW (ref >60)
Glucose, Bld: 112 mg/dL — ABNORMAL HIGH (ref 70–99)
Potassium: 4.2 mmol/L (ref 3.5–5.1)
Sodium: 139 mmol/L (ref 135–145)
Total Bilirubin: 0.5 mg/dL (ref 0.3–1.2)
Total Protein: 6.8 g/dL (ref 6.5–8.1)

## 2022-06-28 ENCOUNTER — Telehealth: Payer: Self-pay | Admitting: Internal Medicine

## 2022-06-28 NOTE — Telephone Encounter (Signed)
Called patient regarding upcoming March - April appointments, patient is notified. 

## 2022-07-03 ENCOUNTER — Inpatient Hospital Stay: Payer: Medicare HMO

## 2022-07-03 DIAGNOSIS — Z5111 Encounter for antineoplastic chemotherapy: Secondary | ICD-10-CM | POA: Diagnosis not present

## 2022-07-03 DIAGNOSIS — C7951 Secondary malignant neoplasm of bone: Secondary | ICD-10-CM

## 2022-07-03 LAB — CBC WITH DIFFERENTIAL/PLATELET
Abs Immature Granulocytes: 0 10*3/uL (ref 0.00–0.07)
Basophils Absolute: 0 10*3/uL (ref 0.0–0.1)
Basophils Relative: 0 %
Eosinophils Absolute: 0.1 10*3/uL (ref 0.0–0.5)
Eosinophils Relative: 3 %
HCT: 29 % — ABNORMAL LOW (ref 36.0–46.0)
Hemoglobin: 9.1 g/dL — ABNORMAL LOW (ref 12.0–15.0)
Immature Granulocytes: 0 %
Lymphocytes Relative: 30 %
Lymphs Abs: 1.2 10*3/uL (ref 0.7–4.0)
MCH: 30.8 pg (ref 26.0–34.0)
MCHC: 31.4 g/dL (ref 30.0–36.0)
MCV: 98.3 fL (ref 80.0–100.0)
Monocytes Absolute: 0.8 10*3/uL (ref 0.1–1.0)
Monocytes Relative: 20 %
Neutro Abs: 2 10*3/uL (ref 1.7–7.7)
Neutrophils Relative %: 47 %
Platelets: 96 10*3/uL — ABNORMAL LOW (ref 150–400)
RBC: 2.95 MIL/uL — ABNORMAL LOW (ref 3.87–5.11)
RDW: 17.2 % — ABNORMAL HIGH (ref 11.5–15.5)
WBC: 4.1 10*3/uL (ref 4.0–10.5)
nRBC: 0 % (ref 0.0–0.2)

## 2022-07-03 LAB — CMP (CANCER CENTER ONLY)
ALT: 16 U/L (ref 0–44)
AST: 28 U/L (ref 15–41)
Albumin: 3.4 g/dL — ABNORMAL LOW (ref 3.5–5.0)
Alkaline Phosphatase: 78 U/L (ref 38–126)
Anion gap: 6 (ref 5–15)
BUN: 11 mg/dL (ref 8–23)
CO2: 24 mmol/L (ref 22–32)
Calcium: 8.9 mg/dL (ref 8.9–10.3)
Chloride: 110 mmol/L (ref 98–111)
Creatinine: 0.99 mg/dL (ref 0.44–1.00)
GFR, Estimated: 60 mL/min — ABNORMAL LOW (ref >60)
Glucose, Bld: 88 mg/dL (ref 70–99)
Potassium: 4.1 mmol/L (ref 3.5–5.1)
Sodium: 140 mmol/L (ref 135–145)
Total Bilirubin: 0.6 mg/dL (ref 0.3–1.2)
Total Protein: 7 g/dL (ref 6.5–8.1)

## 2022-07-10 ENCOUNTER — Encounter: Payer: Self-pay | Admitting: Medical Oncology

## 2022-07-10 ENCOUNTER — Inpatient Hospital Stay (HOSPITAL_BASED_OUTPATIENT_CLINIC_OR_DEPARTMENT_OTHER): Payer: Medicare HMO | Admitting: Internal Medicine

## 2022-07-10 ENCOUNTER — Other Ambulatory Visit: Payer: Self-pay

## 2022-07-10 ENCOUNTER — Inpatient Hospital Stay: Payer: Medicare HMO

## 2022-07-10 VITALS — BP 108/64 | HR 60 | Temp 97.7°F | Resp 16 | Wt 161.8 lb

## 2022-07-10 VITALS — BP 106/55 | HR 80 | Resp 17

## 2022-07-10 DIAGNOSIS — C7951 Secondary malignant neoplasm of bone: Secondary | ICD-10-CM

## 2022-07-10 DIAGNOSIS — Z5111 Encounter for antineoplastic chemotherapy: Secondary | ICD-10-CM | POA: Diagnosis not present

## 2022-07-10 LAB — CMP (CANCER CENTER ONLY)
ALT: 11 U/L (ref 0–44)
AST: 24 U/L (ref 15–41)
Albumin: 3.5 g/dL (ref 3.5–5.0)
Alkaline Phosphatase: 73 U/L (ref 38–126)
Anion gap: 8 (ref 5–15)
BUN: 10 mg/dL (ref 8–23)
CO2: 25 mmol/L (ref 22–32)
Calcium: 9.3 mg/dL (ref 8.9–10.3)
Chloride: 109 mmol/L (ref 98–111)
Creatinine: 1.09 mg/dL — ABNORMAL HIGH (ref 0.44–1.00)
GFR, Estimated: 53 mL/min — ABNORMAL LOW (ref >60)
Glucose, Bld: 134 mg/dL — ABNORMAL HIGH (ref 70–99)
Potassium: 3.6 mmol/L (ref 3.5–5.1)
Sodium: 142 mmol/L (ref 135–145)
Total Bilirubin: 0.5 mg/dL (ref 0.3–1.2)
Total Protein: 6.9 g/dL (ref 6.5–8.1)

## 2022-07-10 LAB — CBC WITH DIFFERENTIAL (CANCER CENTER ONLY)
Abs Immature Granulocytes: 0.02 10*3/uL (ref 0.00–0.07)
Basophils Absolute: 0 10*3/uL (ref 0.0–0.1)
Basophils Relative: 1 %
Eosinophils Absolute: 0.2 10*3/uL (ref 0.0–0.5)
Eosinophils Relative: 3 %
HCT: 29.8 % — ABNORMAL LOW (ref 36.0–46.0)
Hemoglobin: 9.6 g/dL — ABNORMAL LOW (ref 12.0–15.0)
Immature Granulocytes: 0 %
Lymphocytes Relative: 14 %
Lymphs Abs: 0.9 10*3/uL (ref 0.7–4.0)
MCH: 31.8 pg (ref 26.0–34.0)
MCHC: 32.2 g/dL (ref 30.0–36.0)
MCV: 98.7 fL (ref 80.0–100.0)
Monocytes Absolute: 1 10*3/uL (ref 0.1–1.0)
Monocytes Relative: 16 %
Neutro Abs: 4.3 10*3/uL (ref 1.7–7.7)
Neutrophils Relative %: 66 %
Platelet Count: 233 10*3/uL (ref 150–400)
RBC: 3.02 MIL/uL — ABNORMAL LOW (ref 3.87–5.11)
RDW: 17.8 % — ABNORMAL HIGH (ref 11.5–15.5)
WBC Count: 6.5 10*3/uL (ref 4.0–10.5)
nRBC: 0 % (ref 0.0–0.2)

## 2022-07-10 MED ORDER — PROCHLORPERAZINE MALEATE 10 MG PO TABS
10.0000 mg | ORAL_TABLET | Freq: Once | ORAL | Status: AC
  Administered 2022-07-10: 10 mg via ORAL
  Filled 2022-07-10: qty 1

## 2022-07-10 MED ORDER — SODIUM CHLORIDE 0.9 % IV SOLN: Freq: Once | INTRAVENOUS | Status: AC

## 2022-07-10 MED ORDER — SODIUM CHLORIDE 0.9 % IV SOLN
200.0000 mg | Freq: Once | INTRAVENOUS | Status: AC
  Administered 2022-07-10: 200 mg via INTRAVENOUS
  Filled 2022-07-10: qty 200

## 2022-07-10 MED ORDER — SODIUM CHLORIDE 0.9 % IV SOLN
500.0000 mg/m2 | Freq: Once | INTRAVENOUS | Status: AC
  Administered 2022-07-10: 900 mg via INTRAVENOUS
  Filled 2022-07-10: qty 20

## 2022-07-10 NOTE — Patient Instructions (Signed)
[facility]  Discharge Instructions: Thank you for choosing [facility] to provide your oncology and hematology care.   If you have a lab appointment with the Cancer Center, please go directly to the Cancer Center and check in at the registration area.   Wear comfortable clothing and clothing appropriate for easy access to any Portacath or PICC line.   We strive to give you quality time with your provider. You may need to reschedule your appointment if you arrive late (15 or more minutes).  Arriving late affects you and other patients whose appointments are after yours.  Also, if you miss three or more appointments without notifying the office, you may be dismissed from the clinic at the provider's discretion.      For prescription refill requests, have your pharmacy contact our office and allow 72 hours for refills to be completed.    Today you received the following chemotherapy and/or immunotherapy agents: Keytruda and Alimta      To help prevent nausea and vomiting after your treatment, we encourage you to take your nausea medication as directed.  BELOW ARE SYMPTOMS THAT SHOULD BE REPORTED IMMEDIATELY: *FEVER GREATER THAN 100.4 F (38 C) OR HIGHER *CHILLS OR SWEATING *NAUSEA AND VOMITING THAT IS NOT CONTROLLED WITH YOUR NAUSEA MEDICATION *UNUSUAL SHORTNESS OF BREATH *UNUSUAL BRUISING OR BLEEDING *URINARY PROBLEMS (pain or burning when urinating, or frequent urination) *BOWEL PROBLEMS (unusual diarrhea, constipation, pain near the anus) TENDERNESS IN MOUTH AND THROAT WITH OR WITHOUT PRESENCE OF ULCERS (sore throat, sores in mouth, or a toothache) UNUSUAL RASH, SWELLING OR PAIN  UNUSUAL VAGINAL DISCHARGE OR ITCHING   Items with * indicate a potential emergency and should be followed up as soon as possible or go to the Emergency Department if any problems should occur.  Please show the CHEMOTHERAPY ALERT CARD or IMMUNOTHERAPY ALERT  CARD at check-in to the Emergency Department and triage nurse.  Should you have questions after your visit or need to cancel or reschedule your appointment, please contact [facility]  Dept: [phone]  and follow the prompts.  Office hours are 8:00 a.m. to 4:30 p.m. Monday - Friday. Please note that voicemails left after 4:00 p.m. may not be returned until the following business day.  We are closed weekends and major holidays. You have access to a nurse at all times for urgent questions. Please call the main number to the clinic Dept: [phone] and follow the prompts.   For any non-urgent questions, you may also contact your provider using MyChart. We now offer e-Visits for anyone 18 and older to request care online for non-urgent symptoms. For details visit [email or web address].   Also download the MyChart app! Go to the app store, search "MyChart", open the app, select [facility], and log in with your MyChart username and password.   

## 2022-07-10 NOTE — Progress Notes (Signed)
Patient seen by Dr. Inez Pilgrim are within treatment parameters.  Labs reviewed: and are within treatment parameters.  Per physician team, patient is ready for treatment and there are NO modifications to the treatment plan.

## 2022-07-10 NOTE — Progress Notes (Signed)
[facility] Telephone:([phone]) [phone]   Fax:(336) [phone]  OFFICE PROGRESS NOTE  [address], [place] [address]  DIAGNOSIS: Metastatic non-small cell lung cancer, adenocarcinoma that was initially diagnosed as stage IIb (T1b, N1, M0) non-small cell lung cancer, adenocarcinoma presented with right upper lobe lung nodule  Detected Alteration(s) / Biomarker(s) Associated FDA-approved therapies Clinical Trial Availability % cfDNA or Amplification BRAF V600E approved by FDA Dabrafenib+trametinib, Encorafenib+binimetinib approved in other indication Cobimetinib, Dabrafenib, Trametinib, Vemurafenib, Vemurafenib+cobimetinib Yes 8.3%  BRCA1 R252S None (VUS) None (VUS) 0.1%   SMAD4 D469fs None None 7.2%  PD-L1 expression 35%  PRIOR THERAPY:  1) Status post right upper lobectomy with lymph node dissection on September 30, 2019 in [place].  She declines adjuvant systemic chemotherapy. 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 Lisbeth Renshaw.  CURRENT THERAPY: Systemic chemotherapy with carboplatin for AUC of 5, Alimta 500 Mg/M2 and Keytruda 200 Mg IV every 3 weeks.  First dose April 16, 2022.  Status post 4 cycles.  Starting cycle #5 she will be on maintenance treatment with Alimta and Keytruda every 3 weeks.  INTERVAL HISTORY: Brandi Holmes 72 y.o. female returns to clinic today for follow-up visit.  The patient is feeling fine today with no concerning complaints except for fatigue and inability to raise her left arm above the shoulder.  She has some discoloration of her veins from the chemotherapy.  She was advised to have a Port-A-Cath but she is reluctant because she does not have anyone to take her home.  She denied having any chest pain, shortness of breath, cough or hemoptysis.  She has no nausea, vomiting, diarrhea or  constipation.  She has no headache or visual changes.  She has no recent weight loss or night sweats.  MEDICAL HISTORY: Past Medical History:  Diagnosis Date   Anemia    as a child only   Coronary artery calcification of native artery    Hyperlipidemia    Hypertension    Liver spots    " per pt"   Lung nodule    Wears glasses    Wears partial dentures     ALLERGIES:  is allergic to crab [shellfish allergy] and morphine and related.  MEDICATIONS:  Current Outpatient Medications  Medication Sig Dispense Refill   aspirin EC 81 MG tablet Take 81 mg by mouth at bedtime. Swallow whole.     atorvastatin (LIPITOR) 20 MG tablet Take 1 tablet (20 mg total) by mouth at bedtime. 90 tablet 0   calcium carbonate (OSCAL) 1500 (600 Ca) MG TABS tablet Take 600 mg of elemental calcium by mouth daily. When able to remember     clobetasol ointment (TEMOVATE) [id number] % Apply 1 application  topically as needed (rash).     folic acid (FOLVITE) 1 MG tablet Take 1 tablet (1 mg total) by mouth daily. 30 tablet 4   lidocaine (LIDODERM) 5 % Place 1 patch onto the skin daily. Remove & Discard patch within 12 hours or as directed by MD 30 patch 0   lisinopril (ZESTRIL) 20 MG tablet Take 20 mg by mouth in the morning.     magnesium citrate SOLN Take 1 Bottle by mouth once.     MAGNESIUM PO Take 1 tablet by mouth daily. When able to remember     nystatin-triamcinolone ointment (MYCOLOG) Apply 1 application  topically daily.     oxybutynin (DITROPAN) 5 MG tablet Take 5 mg by mouth as needed for bladder spasms.     potassium chloride SA (KLOR-CON M) 20 MEQ tablet Take by mouth daily. Take 1 tablet a day with Calcium and Magnesium when she can remember for leg cramps     pregabalin (LYRICA) 100 MG capsule Take 100 mg by mouth 2 (two) times daily.     prochlorperazine (COMPAZINE) 10 MG tablet Take 1 tablet (10 mg total) by mouth every 6 (six) hours as needed for nausea or vomiting. 30 tablet 0   tiZANidine (ZANAFLEX) 2  MG tablet Take 2 mg by mouth at bedtime.     traMADol (ULTRAM) 50 MG tablet Take 50 mg by mouth every 6 (six) hours as needed for severe pain. Every 8 hours     No current facility-administered medications for this visit.    SURGICAL HISTORY:  Past Surgical History:  Procedure Laterality Date   ABDOMINAL HYSTERECTOMY  1974   heavy bleeding   ABDOMINAL HYSTERECTOMY     BRONCHIAL BRUSHINGS  08/13/2019   Procedure: BRONCHIAL BRUSHINGS;  Surgeon: Garner Nash, DO;  Location: [facility];  Service: Pulmonary;;   BRONCHIAL WASHINGS  08/13/2019   Procedure: BRONCHIAL WASHINGS;  Surgeon: Garner Nash, DO;  Location: [facility];  Service: Pulmonary;;   COLONOSCOPY W/ BIOPSIES AND POLYPECTOMY     DILATION AND CURETTAGE OF UTERUS     FINE NEEDLE ASPIRATION  08/13/2019   Procedure: FINE NEEDLE ASPIRATION (FNA) LINEAR;  Surgeon: Garner Nash, DO;  [facility];  Service: Pulmonary;;   FOOT SURGERY     bilateral bunions and hammer toes   IR RADIOLOGIST EVAL & MGMT  05/20/2022   LUNG BIOPSY  08/13/2019   Procedure: LUNG BIOPSY;  Surgeon: Garner Nash, DO;  Location: [facility];  Service: Pulmonary;;   MULTIPLE TOOTH EXTRACTIONS     VIDEO BRONCHOSCOPY WITH ENDOBRONCHIAL NAVIGATION Right 08/13/2019   Procedure: VIDEO BRONCHOSCOPY WITH ENDOBRONCHIAL NAVIGATION;  Surgeon: Garner Nash, DO;  Location: [facility];  Service: Pulmonary;  Laterality: Right;   VIDEO BRONCHOSCOPY WITH ENDOBRONCHIAL ULTRASOUND Right 08/13/2019   Procedure: VIDEO BRONCHOSCOPY WITH ENDOBRONCHIAL ULTRASOUND;  Surgeon: Garner Nash, DO;  Location: [facility];  Service: Pulmonary;  Laterality: Right;    REVIEW OF SYSTEMS:  A comprehensive review of systems was negative except for: Constitutional: positive for fatigue Musculoskeletal: positive for back pain   PHYSICAL EXAMINATION: General appearance: alert, cooperative, fatigued, and no distress Head: Normocephalic, without obvious abnormality,  atraumatic Neck: no adenopathy, no JVD, supple, symmetrical, trachea midline, and thyroid not enlarged, symmetric, no tenderness/mass/nodules Lymph nodes: Cervical, supraclavicular, and axillary nodes normal. Resp: clear to auscultation bilaterally Back: symmetric, no curvature. ROM normal. No CVA tenderness. Cardio: regular rate and rhythm, S1, S2 normal, no murmur, click, rub or gallop GI: soft, non-tender; bowel sounds normal; no masses,  no organomegaly Extremities: extremities normal, atraumatic, no cyanosis or edema  ECOG PERFORMANCE STATUS: 1 - Symptomatic but completely ambulatory  Blood pressure 108/64, pulse 60, temperature 97.7 F (36.5 C), temperature source Temporal, resp. rate 16, weight 161 lb 12.8 oz (73.4 kg), SpO2 100 %.  LABORATORY DATA: Lab Results  Component Value Date   WBC 6.5 07/10/2022   HGB 9.6 (L) 07/10/2022   HCT 29.8 (L) 07/10/2022   MCV 98.7 07/10/2022   PLT 233 07/10/2022      Chemistry      Component Value Date/Time   NA  140 07/03/2022 1419   NA 142 07/22/2019 1522   K 4.1 07/03/2022 1419   CL 110 07/03/2022 1419   CO2 24 07/03/2022 1419   BUN 11 07/03/2022 1419   BUN 13 07/22/2019 1522   CREATININE 0.99 07/03/2022 1419   CREATININE 0.91 09/16/2011 1000      Component Value Date/Time   CALCIUM 8.9 07/03/2022 1419   ALKPHOS 78 07/03/2022 1419   AST 28 07/03/2022 1419   ALT 16 07/03/2022 1419   BILITOT 0.6 07/03/2022 1419       RADIOGRAPHIC STUDIES: CT Chest W Contrast  Result Date: 06/18/2022 CLINICAL DATA:  Staging non-small-cell lung cancer. * Tracking Code: BO * EXAM: CT CHEST, ABDOMEN, AND PELVIS WITH CONTRAST TECHNIQUE: Multidetector CT imaging of the chest, abdomen and pelvis was performed following the standard protocol during bolus administration of intravenous contrast. RADIATION DOSE REDUCTION: This exam was performed according to the departmental dose-optimization program which includes automated exposure control, adjustment  of the mA and/or kV according to patient size and/or use of iterative reconstruction technique. CONTRAST:  19mL OMNIPAQUE IOHEXOL 300 MG/ML  SOLN COMPARISON:  PET-CT scan 04/05/2022. Older chest CT scan, CT angiogram 03/06/2022 and abdomen pelvis CT from 03/06/2022. Older exams as well. FINDINGS: CT CHEST FINDINGS Cardiovascular: Heart is nonenlarged. No pericardial effusion. Coronary artery calcifications are seen. The thoracic aorta has a normal course and caliber with mild atherosclerotic calcified plaque. Bovine type aortic arch, normal variant. Mediastinum/Nodes: Normal caliber thoracic esophagus. Mild lower esophageal wall thickening. Please correlate with symptoms. Preserved thyroid gland. No specific abnormal lymph node enlargement identified in the axillary region, hilum or mediastinum. There are a few small lymph nodes in the mediastinum such as precarinal which are less than a cm in short axis and not pathologic by size criteria Lungs/Pleura: Left lung has diffuse interstitial septal thickening with scarring and fibrotic changes. There is some bronchiectasis along the medial left lower lobe. No pneumothorax or effusion. The more confluence area of atelectasis along medial left lower lobe is increased from prior PET-CT scan. Stable 3 mm left apical nodule on series 6, image 31. The right lung has increasing parenchymal opacity along the right lower lobe greatest medially where there are confluence areas associated with bronchiectasis and interstitial thickening. Once again there is volume loss in the right hemithorax related to lung resection. Areas of peripheral interstitial septal thickening and fibrosis. No new right-sided lung dominant mass. No pneumothorax. Trace pleural fluid pleural fluid is increasing from previous. Musculoskeletal: Curvature and degenerative changes again seen along the spine. There are posterior right-sided rib deformities identified which could be related to old trauma or  previous intervention. Sclerotic bone lesions are once again identified consistent with known osseous metastatic disease along the spine. These increasing in the upper thoracic spine region. There is also a destructive lytic lesion involving the spine at T9 and T8 consistent with additional area of osseous metastatic disease. These areas were hypermetabolic on PET-CT. CT ABDOMEN PELVIS FINDINGS Hepatobiliary: Multiple hepatic small cystic lesions are identified, benign-appearing no new space-occupying liver lesion. Gallbladder is nondilated. Patent portal vein. Pancreas: Unremarkable. No pancreatic ductal dilatation or surrounding inflammatory changes. Spleen: Normal in size without focal abnormality. Adrenals/Urinary Tract: Adrenal glands are preserved. No left-sided enhancing mass or collecting system dilatation. There is a focal lesion along the lower portion of the right kidney laterally. Once again this is concerning for malignant lesion and today has dimensions of 2.2 by 2.1 cm. Previously 2.2 cm, unchanged. The ureters have  normal course and caliber extending down to the bladder. Preserved contours of the urinary bladder. Stomach/Bowel: There is a redundant course of the sigmoid colon extending into the right upper quadrant. The large bowel otherwise has a normal caliber with moderate colonic stool. Normal appendix. Stomach is distended with fluid and debris. Small bowel is nondilated. Vascular/Lymphatic: Diffuse vascular calcifications identified along the aorta and branch vessels. Normal caliber aorta and IVC. There is significant plaque along the origin of the renal arteries. Please correlate for any evidence of renal artery hypertension. No specific abnormal lymph node enlargement identified in the abdomen and pelvis. Reproductive: Status post hysterectomy. No adnexal masses. Other: Anasarca.  No ascites.  No free air. Musculoskeletal: Multiple areas of bony sclerosis, sclerotic metastatic lesions.  Confluence large area involving L5. Few other areas as well such as L3 which appears to be increasing. A few other areas are increasing as well. Whole-body bone scan as clinically directed. IMPRESSION: Multifocal sclerotic bone metastases again identified. Overall these areas appear to be slightly increasing. If needed follow-up whole-body bone scan. Please correlate with MRI of 03/06/2022 as well. Surgical changes along the right lung from prior resection volume loss. Underlying chronic lung changes. There is some increase in tiny right pleural effusion. Is also increasing bilateral lower lung ill-defined parenchymal opacity and distortion. This could be posttreatment changes. Please correlate for any history of recent radiation to the thoracic spine. Stable aggressive appearing right renal mass. Electronically Signed   By: Jill Side M.D.   On: 06/18/2022 16:36   CT Abdomen Pelvis W Contrast  Result Date: 06/18/2022 CLINICAL DATA:  Staging non-small-cell lung cancer. * Tracking Code: BO * EXAM: CT CHEST, ABDOMEN, AND PELVIS WITH CONTRAST TECHNIQUE: Multidetector CT imaging of the chest, abdomen and pelvis was performed following the standard protocol during bolus administration of intravenous contrast. RADIATION DOSE REDUCTION: This exam was performed according to the departmental dose-optimization program which includes automated exposure control, adjustment of the mA and/or kV according to patient size and/or use of iterative reconstruction technique. CONTRAST:  114mL OMNIPAQUE IOHEXOL 300 MG/ML  SOLN COMPARISON:  PET-CT scan 04/05/2022. Older chest CT scan, CT angiogram 03/06/2022 and abdomen pelvis CT from 03/06/2022. Older exams as well. FINDINGS: CT CHEST FINDINGS Cardiovascular: Heart is nonenlarged. No pericardial effusion. Coronary artery calcifications are seen. The thoracic aorta has a normal course and caliber with mild atherosclerotic calcified plaque. Bovine type aortic arch, normal variant.  Mediastinum/Nodes: Normal caliber thoracic esophagus. Mild lower esophageal wall thickening. Please correlate with symptoms. Preserved thyroid gland. No specific abnormal lymph node enlargement identified in the axillary region, hilum or mediastinum. There are a few small lymph nodes in the mediastinum such as precarinal which are less than a cm in short axis and not pathologic by size criteria Lungs/Pleura: Left lung has diffuse interstitial septal thickening with scarring and fibrotic changes. There is some bronchiectasis along the medial left lower lobe. No pneumothorax or effusion. The more confluence area of atelectasis along medial left lower lobe is increased from prior PET-CT scan. Stable 3 mm left apical nodule on series 6, image 31. The right lung has increasing parenchymal opacity along the right lower lobe greatest medially where there are confluence areas associated with bronchiectasis and interstitial thickening. Once again there is volume loss in the right hemithorax related to lung resection. Areas of peripheral interstitial septal thickening and fibrosis. No new right-sided lung dominant mass. No pneumothorax. Trace pleural fluid pleural fluid is increasing from previous. Musculoskeletal: Curvature and degenerative  changes again seen along the spine. There are posterior right-sided rib deformities identified which could be related to old trauma or previous intervention. Sclerotic bone lesions are once again identified consistent with known osseous metastatic disease along the spine. These increasing in the upper thoracic spine region. There is also a destructive lytic lesion involving the spine at T9 and T8 consistent with additional area of osseous metastatic disease. These areas were hypermetabolic on PET-CT. CT ABDOMEN PELVIS FINDINGS Hepatobiliary: Multiple hepatic small cystic lesions are identified, benign-appearing no new space-occupying liver lesion. Gallbladder is nondilated. Patent portal  vein. Pancreas: Unremarkable. No pancreatic ductal dilatation or surrounding inflammatory changes. Spleen: Normal in size without focal abnormality. Adrenals/Urinary Tract: Adrenal glands are preserved. No left-sided enhancing mass or collecting system dilatation. There is a focal lesion along the lower portion of the right kidney laterally. Once again this is concerning for malignant lesion and today has dimensions of 2.2 by 2.1 cm. Previously 2.2 cm, unchanged. The ureters have normal course and caliber extending down to the bladder. Preserved contours of the urinary bladder. Stomach/Bowel: There is a redundant course of the sigmoid colon extending into the right upper quadrant. The large bowel otherwise has a normal caliber with moderate colonic stool. Normal appendix. Stomach is distended with fluid and debris. Small bowel is nondilated. Vascular/Lymphatic: Diffuse vascular calcifications identified along the aorta and branch vessels. Normal caliber aorta and IVC. There is significant plaque along the origin of the renal arteries. Please correlate for any evidence of renal artery hypertension. No specific abnormal lymph node enlargement identified in the abdomen and pelvis. Reproductive: Status post hysterectomy. No adnexal masses. Other: Anasarca.  No ascites.  No free air. Musculoskeletal: Multiple areas of bony sclerosis, sclerotic metastatic lesions. Confluence large area involving L5. Few other areas as well such as L3 which appears to be increasing. A few other areas are increasing as well. Whole-body bone scan as clinically directed. IMPRESSION: Multifocal sclerotic bone metastases again identified. Overall these areas appear to be slightly increasing. If needed follow-up whole-body bone scan. Please correlate with MRI of 03/06/2022 as well. Surgical changes along the right lung from prior resection volume loss. Underlying chronic lung changes. There is some increase in tiny right pleural effusion. Is  also increasing bilateral lower lung ill-defined parenchymal opacity and distortion. This could be posttreatment changes. Please correlate for any history of recent radiation to the thoracic spine. Stable aggressive appearing right renal mass. Electronically Signed   By: Jill Side M.D.   On: 06/18/2022 16:36     ASSESSMENT AND PLAN: This is a pleasant 72 years old African-American female diagnosed with metastatic also lung cancer, adenocarcinoma initially diagnosed as stage IIb (T1b, N1, M0) non-small cell lung cancer, adenocarcinoma status post a right upper lobectomy with lymph node dissection on September 30, 2019 in [place].  She declined adjuvant systemic chemotherapy at that time. She was found to have evidence for disease metastasis in November 2023 presenting with multiple metastatic bone lesions. The patient had molecular studies that showed an actionable mutation with BRAF V600E mutation and PD-L1 expression of 35%. She underwent palliative radiotherapy to the T9 vertebral lesion under the care of Dr. Lisbeth Renshaw. The patient is currently undergoing first-line combination of systemic chemotherapy with carboplatin for AUC of 5, Alimta 500 Mg/M2 and Keytruda 200 Mg IV every 3 weeks.  Status post 4 cycles.  Starting from cycle #5 she will be on maintenance treatment with Alimta and Keytruda every 3 weeks. The patient has been tolerating  this treatment well with no concerning adverse effects. I recommended for her to proceed with cycle #5 today as planned. I will see her back for follow-up visit in 3 weeks for evaluation before the next cycle of her treatment. The patient was advised to call immediately if she has any concerning symptoms in the interval. The patient voices understanding of current disease status and treatment options and is in agreement with the current care plan. The total time spent in the appointment was 20 minutes.  All questions were answered. The patient knows to call the clinic  with any problems, questions or concerns. We can certainly see the patient much sooner if necessary.  Disclaimer: This note was dictated with voice recognition software. Similar sounding words can inadvertently be transcribed and may not be corrected upon review.

## 2022-07-25 ENCOUNTER — Telehealth: Payer: Self-pay | Admitting: Medical Oncology

## 2022-07-25 ENCOUNTER — Telehealth: Payer: Self-pay | Admitting: Internal Medicine

## 2022-07-25 DIAGNOSIS — G893 Neoplasm related pain (acute) (chronic): Secondary | ICD-10-CM

## 2022-07-25 MED ORDER — LIDOCAINE 5 % EX PTCH: 1.0000 | MEDICATED_PATCH | CUTANEOUS | 0 refills | Status: DC

## 2022-07-25 NOTE — Telephone Encounter (Signed)
Called patient regarding April/May appointments, patient is notified.  

## 2022-07-25 NOTE — Telephone Encounter (Signed)
Pt requested a refill Lidocaine patch for pain near ribcage.

## 2022-08-01 ENCOUNTER — Inpatient Hospital Stay: Payer: Medicare HMO | Attending: Physician Assistant

## 2022-08-01 ENCOUNTER — Encounter: Payer: Self-pay | Admitting: Medical Oncology

## 2022-08-01 ENCOUNTER — Inpatient Hospital Stay (HOSPITAL_BASED_OUTPATIENT_CLINIC_OR_DEPARTMENT_OTHER): Payer: Medicare HMO | Admitting: Internal Medicine

## 2022-08-01 ENCOUNTER — Other Ambulatory Visit: Payer: Self-pay | Admitting: Internal Medicine

## 2022-08-01 ENCOUNTER — Inpatient Hospital Stay: Payer: Medicare HMO

## 2022-08-01 VITALS — BP 112/58 | HR 75 | Temp 97.8°F | Resp 16 | Wt 162.8 lb

## 2022-08-01 DIAGNOSIS — C3411 Malignant neoplasm of upper lobe, right bronchus or lung: Secondary | ICD-10-CM | POA: Diagnosis not present

## 2022-08-01 DIAGNOSIS — C7951 Secondary malignant neoplasm of bone: Secondary | ICD-10-CM | POA: Insufficient documentation

## 2022-08-01 DIAGNOSIS — Z5111 Encounter for antineoplastic chemotherapy: Secondary | ICD-10-CM | POA: Insufficient documentation

## 2022-08-01 DIAGNOSIS — Z5112 Encounter for antineoplastic immunotherapy: Secondary | ICD-10-CM | POA: Insufficient documentation

## 2022-08-01 DIAGNOSIS — Z79620 Long term (current) use of immunosuppressive biologic: Secondary | ICD-10-CM | POA: Insufficient documentation

## 2022-08-01 DIAGNOSIS — C3490 Malignant neoplasm of unspecified part of unspecified bronchus or lung: Secondary | ICD-10-CM | POA: Diagnosis not present

## 2022-08-01 DIAGNOSIS — Z902 Acquired absence of lung [part of]: Secondary | ICD-10-CM | POA: Diagnosis not present

## 2022-08-01 DIAGNOSIS — Z79631 Long term (current) use of antimetabolite agent: Secondary | ICD-10-CM | POA: Diagnosis not present

## 2022-08-01 LAB — CBC WITH DIFFERENTIAL (CANCER CENTER ONLY)
Abs Immature Granulocytes: 0.02 10*3/uL (ref 0.00–0.07)
Basophils Absolute: 0.1 10*3/uL (ref 0.0–0.1)
Basophils Relative: 1 %
Eosinophils Absolute: 0.5 10*3/uL (ref 0.0–0.5)
Eosinophils Relative: 6 %
HCT: 27.6 % — ABNORMAL LOW (ref 36.0–46.0)
Hemoglobin: 8.7 g/dL — ABNORMAL LOW (ref 12.0–15.0)
Immature Granulocytes: 0 %
Lymphocytes Relative: 17 %
Lymphs Abs: 1.2 10*3/uL (ref 0.7–4.0)
MCH: 32.1 pg (ref 26.0–34.0)
MCHC: 31.5 g/dL (ref 30.0–36.0)
MCV: 101.8 fL — ABNORMAL HIGH (ref 80.0–100.0)
Monocytes Absolute: 1.2 10*3/uL — ABNORMAL HIGH (ref 0.1–1.0)
Monocytes Relative: 16 %
Neutro Abs: 4.3 10*3/uL (ref 1.7–7.7)
Neutrophils Relative %: 60 %
Platelet Count: 228 10*3/uL (ref 150–400)
RBC: 2.71 MIL/uL — ABNORMAL LOW (ref 3.87–5.11)
RDW: 16.1 % — ABNORMAL HIGH (ref 11.5–15.5)
WBC Count: 7.2 10*3/uL (ref 4.0–10.5)
nRBC: 0 % (ref 0.0–0.2)

## 2022-08-01 LAB — CMP (CANCER CENTER ONLY)
ALT: 9 U/L (ref 0–44)
AST: 26 U/L (ref 15–41)
Albumin: 3.3 g/dL — ABNORMAL LOW (ref 3.5–5.0)
Alkaline Phosphatase: 73 U/L (ref 38–126)
Anion gap: 8 (ref 5–15)
BUN: 17 mg/dL (ref 8–23)
CO2: 25 mmol/L (ref 22–32)
Calcium: 9.3 mg/dL (ref 8.9–10.3)
Chloride: 108 mmol/L (ref 98–111)
Creatinine: 1.58 mg/dL — ABNORMAL HIGH (ref 0.44–1.00)
GFR, Estimated: 34 mL/min — ABNORMAL LOW (ref >60)
Glucose, Bld: 98 mg/dL (ref 70–99)
Potassium: 3.6 mmol/L (ref 3.5–5.1)
Sodium: 141 mmol/L (ref 135–145)
Total Bilirubin: 0.6 mg/dL (ref 0.3–1.2)
Total Protein: 6.8 g/dL (ref 6.5–8.1)

## 2022-08-01 LAB — TSH: TSH: 0.657 u[IU]/mL (ref 0.350–4.500)

## 2022-08-01 MED ORDER — SODIUM CHLORIDE 0.9 % IV SOLN: Freq: Once | INTRAVENOUS | Status: AC

## 2022-08-01 MED ORDER — HEPARIN SOD (PORK) LOCK FLUSH 100 UNIT/ML IV SOLN: 500.0000 [IU] | Freq: Once | INTRAVENOUS | Status: DC | PRN

## 2022-08-01 MED ORDER — SODIUM CHLORIDE 0.9% FLUSH: 10.0000 mL | INTRAVENOUS | Status: DC | PRN

## 2022-08-01 MED ORDER — SODIUM CHLORIDE 0.9 % IV SOLN
200.0000 mg | Freq: Once | INTRAVENOUS | Status: AC
  Administered 2022-08-01: 200 mg via INTRAVENOUS
  Filled 2022-08-01: qty 200

## 2022-08-01 MED ORDER — SODIUM CHLORIDE 0.9 % IV SOLN
400.0000 mg/m2 | Freq: Once | INTRAVENOUS | Status: AC
  Administered 2022-08-01: 800 mg via INTRAVENOUS
  Filled 2022-08-01: qty 20

## 2022-08-01 MED ORDER — CYANOCOBALAMIN 1000 MCG/ML IJ SOLN
1000.0000 ug | Freq: Once | INTRAMUSCULAR | Status: AC
  Administered 2022-08-01: 1000 ug via INTRAMUSCULAR
  Filled 2022-08-01: qty 1

## 2022-08-01 MED ORDER — PROCHLORPERAZINE MALEATE 10 MG PO TABS
10.0000 mg | ORAL_TABLET | Freq: Once | ORAL | Status: AC
  Administered 2022-08-01: 10 mg via ORAL
  Filled 2022-08-01: qty 1

## 2022-08-01 NOTE — Patient Instructions (Signed)
[facility]  Discharge Instructions: Thank you for choosing [facility] to provide your oncology and hematology care.   If you have a lab appointment with the Cancer Center, please go directly to the Cancer Center and check in at the registration area.   Wear comfortable clothing and clothing appropriate for easy access to any Portacath or PICC line.   We strive to give you quality time with your provider. You may need to reschedule your appointment if you arrive late (15 or more minutes).  Arriving late affects you and other patients whose appointments are after yours.  Also, if you miss three or more appointments without notifying the office, you may be dismissed from the clinic at the provider's discretion.      For prescription refill requests, have your pharmacy contact our office and allow 72 hours for refills to be completed.    Today you received the following chemotherapy and/or immunotherapy agents: Keytruda, Alimta.       To help prevent nausea and vomiting after your treatment, we encourage you to take your nausea medication as directed.  BELOW ARE SYMPTOMS THAT SHOULD BE REPORTED IMMEDIATELY: *FEVER GREATER THAN 100.4 F (38 C) OR HIGHER *CHILLS OR SWEATING *NAUSEA AND VOMITING THAT IS NOT CONTROLLED WITH YOUR NAUSEA MEDICATION *UNUSUAL SHORTNESS OF BREATH *UNUSUAL BRUISING OR BLEEDING *URINARY PROBLEMS (pain or burning when urinating, or frequent urination) *BOWEL PROBLEMS (unusual diarrhea, constipation, pain near the anus) TENDERNESS IN MOUTH AND THROAT WITH OR WITHOUT PRESENCE OF ULCERS (sore throat, sores in mouth, or a toothache) UNUSUAL RASH, SWELLING OR PAIN  UNUSUAL VAGINAL DISCHARGE OR ITCHING   Items with * indicate a potential emergency and should be followed up as soon as possible or go to the Emergency Department if any problems should occur.  Please show the CHEMOTHERAPY ALERT CARD or IMMUNOTHERAPY ALERT CARD  at check-in to the Emergency Department and triage nurse.  Should you have questions after your visit or need to cancel or reschedule your appointment, please contact [facility]  Dept: [phone]  and follow the prompts.  Office hours are 8:00 a.m. to 4:30 p.m. Monday - Friday. Please note that voicemails left after 4:00 p.m. may not be returned until the following business day.  We are closed weekends and major holidays. You have access to a nurse at all times for urgent questions. Please call the main number to the clinic Dept: [phone] and follow the prompts.   For any non-urgent questions, you may also contact your provider using MyChart. We now offer e-Visits for anyone 18 and older to request care online for non-urgent symptoms. For details visit [email or web address].   Also download the MyChart app! Go to the app store, search "MyChart", open the app, select , and log in with your MyChart username and password.   

## 2022-08-01 NOTE — Progress Notes (Signed)
Patient seen by Dr. Gypsy Balsam are within treatment parameters.  Labs reviewed: and are not all within treatment parameters. Per Dr. Arbutus Ped ,it is ok to treat pt today with Keytruda and Alimta and creatinine of  1.58 after a dose reduction of Alimta.  Per physician team, patient is ready for treatment. Please note that modifications are being made to the treatment plan including reducing Alimta dose.

## 2022-08-01 NOTE — Progress Notes (Signed)
[facility] [phone]   Fax:([phone]  OFFICE PROGRESS NOTE  Brandi Able, MD [address] [place] [address]  DIAGNOSIS: Metastatic non-small cell lung cancer, adenocarcinoma that was initially diagnosed as stage IIb (T1b, N1, M0) non-small cell lung cancer, adenocarcinoma presented with right upper lobe lung nodule  Detected Alteration(s) / Biomarker(s) Associated FDA-approved therapies Clinical Trial Availability % cfDNA or Amplification BRAF V600E approved by FDA Dabrafenib+trametinib, Encorafenib+binimetinib approved in other indication Cobimetinib, Dabrafenib, Trametinib, Vemurafenib, Vemurafenib+cobimetinib Yes 8.3%  BRCA1 R252S None (VUS) None (VUS) 0.1%   SMAD4 D471fs None None 7.2%  PD-L1 expression 35%  PRIOR THERAPY:  1) Status post right upper lobectomy with lymph node dissection on September 30, 2019 in [place].  She declines adjuvant systemic chemotherapy. 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 Mitzi Holmes.  CURRENT THERAPY: Systemic chemotherapy with carboplatin for AUC of 5, Alimta 500 Mg/M2 and Keytruda 200 Mg IV every 3 weeks.  First dose April 16, 2022.  Status post 5 cycles.  Starting cycle #5 she will be on maintenance treatment with Alimta and Keytruda every 3 weeks.  INTERVAL HISTORY: Brandi Holmes 72 y.o. female returns to the neck today for follow-up visit.  The patient is feeling fine with no concerning complaints except for the intermittent pain on the right side of the chest from the previous rib fractures.  She denied having any current shortness of breath except with exertion with mild cough and no hemoptysis.  She has no nausea, vomiting, diarrhea or constipation.  She has no headache or visual changes.  She denied having any recent weight loss or night sweats.  She is here today for  evaluation before starting cycle #6 of her treatment.   MEDICAL HISTORY: Past Medical History:  Diagnosis Date   Anemia    as a child only   Coronary artery calcification of native artery    Hyperlipidemia    Hypertension    Liver spots    " per pt"   Lung nodule    Wears glasses    Wears partial dentures     ALLERGIES:  is allergic to crab [shellfish allergy] and morphine and related.  MEDICATIONS:  Current Outpatient Medications  Medication Sig Dispense Refill   aspirin EC 81 MG tablet Take 81 mg by mouth at bedtime. Swallow whole.     atorvastatin (LIPITOR) 20 MG tablet Take 1 tablet (20 mg total) by mouth at bedtime. 90 tablet 0   calcium carbonate (OSCAL) 1500 (600 Ca) MG TABS tablet Take 600 mg of elemental calcium by mouth daily. When Holmes to remember     clobetasol ointment (TEMOVATE) 0.05 % Apply 1 application  topically as needed (rash).     folic acid (FOLVITE) 1 MG tablet Take 1 tablet (1 mg total) by mouth daily. 30 tablet 4   furosemide (LASIX) 20 MG tablet Take 20 mg by mouth daily.     lidocaine (LIDODERM) 5 % Place 1 patch onto the skin daily. Remove & Discard patch within 12 hours or as directed by MD 30 patch 0   lisinopril (ZESTRIL) 20 MG tablet Take 20 mg by mouth in the morning.     magnesium citrate SOLN Take 1 Bottle by mouth once.     MAGNESIUM PO Take 1 tablet by mouth daily. When Holmes to remember  nystatin-triamcinolone ointment (MYCOLOG) Apply 1 application  topically daily.     oxybutynin (DITROPAN) 5 MG tablet Take 5 mg by mouth as needed for bladder spasms.     potassium chloride SA (KLOR-CON M) 20 MEQ tablet Take by mouth daily. Take 1 tablet a day with Calcium and Magnesium when she can remember for leg cramps     pregabalin (LYRICA) 150 MG capsule Take 150 mg by mouth 2 (two) times daily.     prochlorperazine (COMPAZINE) 10 MG tablet Take 1 tablet (10 mg total) by mouth every 6 (six) hours as needed for nausea or vomiting. 30 tablet 0    tiZANidine (ZANAFLEX) 2 MG tablet Take 2 mg by mouth at bedtime.     traMADol (ULTRAM) 50 MG tablet Take 50 mg by mouth every 6 (six) hours as needed for severe pain. Every 8 hours     No current facility-administered medications for this visit.    SURGICAL HISTORY:  Past Surgical History:  Procedure Laterality Date   ABDOMINAL HYSTERECTOMY  1974   heavy bleeding   ABDOMINAL HYSTERECTOMY     BRONCHIAL BRUSHINGS  08/13/2019   Procedure: BRONCHIAL BRUSHINGS;  Surgeon: Josephine Igo, DO;  Location: [facility];  Service: Pulmonary;;   BRONCHIAL WASHINGS  08/13/2019   Procedure: BRONCHIAL WASHINGS;  Surgeon: Josephine Igo, DO;  [facility]: [facility];  Service: Pulmonary;;   COLONOSCOPY W/ BIOPSIES AND POLYPECTOMY     DILATION AND CURETTAGE OF UTERUS     FINE NEEDLE ASPIRATION  08/13/2019   Procedure: FINE NEEDLE ASPIRATION (FNA) LINEAR;  Surgeon: Josephine Igo, DO;  Location: [facility];  Service: Pulmonary;;   FOOT SURGERY     bilateral bunions and hammer toes   IR RADIOLOGIST EVAL & MGMT  05/20/2022   LUNG BIOPSY  08/13/2019   Procedure: LUNG BIOPSY;  Surgeon: Josephine Igo, DO;  [facility]: [facility];  Service: Pulmonary;;   MULTIPLE TOOTH EXTRACTIONS     VIDEO BRONCHOSCOPY WITH ENDOBRONCHIAL NAVIGATION Right 08/13/2019   Procedure: VIDEO BRONCHOSCOPY WITH ENDOBRONCHIAL NAVIGATION;  Surgeon: Josephine Igo, DO;  [facility];  Service: Pulmonary;  Laterality: Right;   VIDEO BRONCHOSCOPY WITH ENDOBRONCHIAL ULTRASOUND Right 08/13/2019   Procedure: VIDEO BRONCHOSCOPY WITH ENDOBRONCHIAL ULTRASOUND;  Surgeon: Josephine Igo, DO;  [facility]: [facility];  Service: Pulmonary;  Laterality: Right;    REVIEW OF SYSTEMS:  A comprehensive review of systems was negative except for: Constitutional: positive for fatigue Respiratory: positive for pleurisy/chest pain Musculoskeletal: positive for back pain   PHYSICAL EXAMINATION: General appearance: alert, cooperative,  fatigued, and no distress Head: Normocephalic, without obvious abnormality, atraumatic Neck: no adenopathy, no JVD, supple, symmetrical, trachea midline, and thyroid not enlarged, symmetric, no tenderness/mass/nodules Lymph nodes: Cervical, supraclavicular, and axillary nodes normal. Resp: clear to auscultation bilaterally Back: symmetric, no curvature. ROM normal. No CVA tenderness. Cardio: regular rate and rhythm, S1, S2 normal, no murmur, click, rub or gallop GI: soft, non-tender; bowel sounds normal; no masses,  no organomegaly Extremities: extremities normal, atraumatic, no cyanosis or edema  ECOG PERFORMANCE STATUS: 1 - Symptomatic but completely ambulatory  Blood pressure (!) 112/58, pulse 75, temperature (!) 100.4 F (38 C), temperature source Oral, resp. rate 16, weight 162 lb 12.8 oz (73.8 kg), SpO2 99 %.  Repeat temp 97.8.  LABORATORY DATA: Lab Results  Component Value Date   WBC 7.2 08/01/2022   HGB 8.7 (L) 08/01/2022   HCT 27.6 (L) 08/01/2022   MCV 101.8 (H) 08/01/2022   PLT  228 08/01/2022      Chemistry      Component Value Date/Time   NA 142 07/10/2022 1354   NA 142 07/22/2019 1522   K 3.6 07/10/2022 1354   CL 109 07/10/2022 1354   CO2 25 07/10/2022 1354   BUN 10 07/10/2022 1354   BUN 13 07/22/2019 1522   CREATININE 1.09 (H) 07/10/2022 1354   CREATININE 0.91 09/16/2011 1000      Component Value Date/Time   CALCIUM 9.3 07/10/2022 1354   ALKPHOS 73 07/10/2022 1354   AST 24 07/10/2022 1354   ALT 11 07/10/2022 1354   BILITOT 0.5 07/10/2022 1354       RADIOGRAPHIC STUDIES: No results found.   ASSESSMENT AND PLAN: This is a pleasant 72 years old African-American female diagnosed with metastatic also lung cancer, adenocarcinoma initially diagnosed as stage IIb (T1b, N1, M0) non-small cell lung cancer, adenocarcinoma status post a right upper lobectomy with lymph node dissection on September 30, 2019 in [place].  She declined adjuvant systemic chemotherapy at that  time. She was found to have evidence for disease metastasis in November 2023 presenting with multiple metastatic bone lesions. The patient had molecular studies that showed an actionable mutation with BRAF V600E mutation and PD-L1 expression of 35%. She underwent palliative radiotherapy to the T9 vertebral lesion under the care of Dr. Mitzi Holmes. The patient is currently undergoing first-line combination of systemic chemotherapy with carboplatin for AUC of 5, Alimta 500 Mg/M2 and Keytruda 200 Mg IV every 3 weeks.  Status post 5 cycles.  Starting from cycle #5 she will be on maintenance treatment with Alimta and Keytruda every 3 weeks. She has been tolerating this treatment well with no concerning adverse effects. I recommended for the patient to proceed with cycle #6 today as planned. I will see her back for follow-up visit in 3 weeks for evaluation with repeat CT scan of the chest, abdomen and pelvis for restaging of her disease. The patient was advised to call immediately if she has any other concerning symptoms in the interval. The patient voices understanding of current disease status and treatment options and is in agreement with the current care plan. The total time spent in the appointment was 20 minutes.  All questions were answered. The patient knows to call the clinic with any problems, questions or concerns. We can certainly see the patient much sooner if necessary.  Disclaimer: This note was dictated with voice recognition software. Similar sounding words can inadvertently be transcribed and may not be corrected upon review.

## 2022-08-01 NOTE — Progress Notes (Deleted)
Patient seen by Dr. Mohamed  Vitals are within treatment parameters.  Labs reviewed: and are within treatment parameters.  Per physician team, patient is ready for treatment and there are NO modifications to the treatment plan.  

## 2022-08-03 LAB — T4: T4, Total: 6.9 ug/dL (ref 4.5–12.0)

## 2022-08-08 ENCOUNTER — Other Ambulatory Visit: Payer: Self-pay | Admitting: Internal Medicine

## 2022-08-17 ENCOUNTER — Encounter (HOSPITAL_BASED_OUTPATIENT_CLINIC_OR_DEPARTMENT_OTHER): Payer: Self-pay

## 2022-08-17 ENCOUNTER — Ambulatory Visit (HOSPITAL_BASED_OUTPATIENT_CLINIC_OR_DEPARTMENT_OTHER)
Admission: RE | Admit: 2022-08-17 | Discharge: 2022-08-17 | Disposition: A | Discharge status: Home / Self Care | Payer: Medicare HMO | Source: Ambulatory Visit | Attending: Internal Medicine | Admitting: Internal Medicine

## 2022-08-17 DIAGNOSIS — C3490 Malignant neoplasm of unspecified part of unspecified bronchus or lung: Secondary | ICD-10-CM | POA: Diagnosis present

## 2022-08-17 MED ORDER — IOHEXOL 300 MG/ML  SOLN
100.0000 mL | Freq: Once | INTRAMUSCULAR | Status: AC | PRN
  Administered 2022-08-17: 80 mL via INTRAVENOUS

## 2022-08-21 ENCOUNTER — Inpatient Hospital Stay: Payer: Medicare HMO | Attending: Physician Assistant

## 2022-08-21 ENCOUNTER — Inpatient Hospital Stay: Payer: Medicare HMO

## 2022-08-21 ENCOUNTER — Other Ambulatory Visit: Payer: Self-pay

## 2022-08-21 ENCOUNTER — Inpatient Hospital Stay (HOSPITAL_BASED_OUTPATIENT_CLINIC_OR_DEPARTMENT_OTHER): Payer: Medicare HMO | Admitting: Internal Medicine

## 2022-08-21 VITALS — BP 98/48 | HR 88 | Temp 97.9°F | Resp 18

## 2022-08-21 DIAGNOSIS — Z5111 Encounter for antineoplastic chemotherapy: Secondary | ICD-10-CM | POA: Insufficient documentation

## 2022-08-21 DIAGNOSIS — C3411 Malignant neoplasm of upper lobe, right bronchus or lung: Secondary | ICD-10-CM | POA: Insufficient documentation

## 2022-08-21 DIAGNOSIS — C7951 Secondary malignant neoplasm of bone: Secondary | ICD-10-CM

## 2022-08-21 DIAGNOSIS — Z902 Acquired absence of lung [part of]: Secondary | ICD-10-CM | POA: Diagnosis not present

## 2022-08-21 DIAGNOSIS — Z5112 Encounter for antineoplastic immunotherapy: Secondary | ICD-10-CM | POA: Insufficient documentation

## 2022-08-21 LAB — CBC WITH DIFFERENTIAL (CANCER CENTER ONLY)
Abs Immature Granulocytes: 0.02 10*3/uL (ref 0.00–0.07)
Basophils Absolute: 0.1 10*3/uL (ref 0.0–0.1)
Basophils Relative: 1 %
Eosinophils Absolute: 0.7 10*3/uL — ABNORMAL HIGH (ref 0.0–0.5)
Eosinophils Relative: 9 %
HCT: 28.5 % — ABNORMAL LOW (ref 36.0–46.0)
Hemoglobin: 9.4 g/dL — ABNORMAL LOW (ref 12.0–15.0)
Immature Granulocytes: 0 %
Lymphocytes Relative: 25 %
Lymphs Abs: 2.1 10*3/uL (ref 0.7–4.0)
MCH: 33.1 pg (ref 26.0–34.0)
MCHC: 33 g/dL (ref 30.0–36.0)
MCV: 100.4 fL — ABNORMAL HIGH (ref 80.0–100.0)
Monocytes Absolute: 1.2 10*3/uL — ABNORMAL HIGH (ref 0.1–1.0)
Monocytes Relative: 14 %
Neutro Abs: 4.3 10*3/uL (ref 1.7–7.7)
Neutrophils Relative %: 51 %
Platelet Count: 239 10*3/uL (ref 150–400)
RBC: 2.84 MIL/uL — ABNORMAL LOW (ref 3.87–5.11)
RDW: 15.3 % (ref 11.5–15.5)
WBC Count: 8.5 10*3/uL (ref 4.0–10.5)
nRBC: 0 % (ref 0.0–0.2)

## 2022-08-21 LAB — CMP (CANCER CENTER ONLY)
ALT: 11 U/L (ref 0–44)
AST: 30 U/L (ref 15–41)
Albumin: 3.4 g/dL — ABNORMAL LOW (ref 3.5–5.0)
Alkaline Phosphatase: 82 U/L (ref 38–126)
Anion gap: 8 (ref 5–15)
BUN: 16 mg/dL (ref 8–23)
CO2: 24 mmol/L (ref 22–32)
Calcium: 8.9 mg/dL (ref 8.9–10.3)
Chloride: 108 mmol/L (ref 98–111)
Creatinine: 1.52 mg/dL — ABNORMAL HIGH (ref 0.44–1.00)
GFR, Estimated: 36 mL/min — ABNORMAL LOW (ref >60)
Glucose, Bld: 93 mg/dL (ref 70–99)
Potassium: 3.8 mmol/L (ref 3.5–5.1)
Sodium: 140 mmol/L (ref 135–145)
Total Bilirubin: 0.5 mg/dL (ref 0.3–1.2)
Total Protein: 6.9 g/dL (ref 6.5–8.1)

## 2022-08-21 MED ORDER — SODIUM CHLORIDE 0.9 % IV SOLN
400.0000 mg/m2 | Freq: Once | INTRAVENOUS | Status: AC
  Administered 2022-08-21: 800 mg via INTRAVENOUS
  Filled 2022-08-21: qty 32

## 2022-08-21 MED ORDER — SODIUM CHLORIDE 0.9 % IV SOLN: Freq: Once | INTRAVENOUS | Status: AC

## 2022-08-21 MED ORDER — SODIUM CHLORIDE 0.9 % IV SOLN
200.0000 mg | Freq: Once | INTRAVENOUS | Status: AC
  Administered 2022-08-21: 200 mg via INTRAVENOUS
  Filled 2022-08-21: qty 200

## 2022-08-21 MED ORDER — PROCHLORPERAZINE MALEATE 10 MG PO TABS
10.0000 mg | ORAL_TABLET | Freq: Once | ORAL | Status: AC
  Administered 2022-08-21: 10 mg via ORAL
  Filled 2022-08-21: qty 1

## 2022-08-21 NOTE — Progress Notes (Signed)
[facility] [phone]   Fax:(336) [phone]  OFFICE PROGRESS NOTE  Brandi Able, MD [address] [place] [address]  DIAGNOSIS: Metastatic non-small cell lung cancer, adenocarcinoma that was initially diagnosed as stage IIb (T1b, N1, M0) non-small cell lung cancer, adenocarcinoma presented with right upper lobe lung nodule  Detected Alteration(s) / Biomarker(s) Associated FDA-approved therapies Clinical Trial Availability % cfDNA or Amplification BRAF V600E approved by FDA Dabrafenib+trametinib, Encorafenib+binimetinib approved in other indication Cobimetinib, Dabrafenib, Trametinib, Vemurafenib, Vemurafenib+cobimetinib Yes 8.3%  BRCA1 R252S None (VUS) None (VUS) 0.1%   SMAD4 D415fs None None 7.2%  PD-L1 expression 35%  PRIOR THERAPY:  1) Status post right upper lobectomy with lymph node dissection on September 30, 2019 in [place].  She declines adjuvant systemic chemotherapy. 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 Mitzi Hansen.  CURRENT THERAPY: Systemic chemotherapy with carboplatin for AUC of 5, Alimta 500 Mg/M2 and Keytruda 200 Mg IV every 3 weeks.  First dose April 16, 2022.  Status post 6 cycles.  Starting cycle #5 she will be on maintenance treatment with Alimta and Keytruda every 3 weeks.  INTERVAL HISTORY: Brandi Holmes 72 y.o. female returns to the clinic today for follow-up visit.  The patient is feeling fine today with no concerning complaints except for the baseline fatigue and shortness of breath with exertion.  She also has chronic back pain and she was seen recently by her primary care physician and her dose of tramadol was increased to 100 mg p.o. 4 times daily.  She takes it around 3 times a day.  She denied having any chest pain, cough or hemoptysis.  She has no nausea, vomiting, diarrhea or constipation.   She has no headache or visual changes.  She had repeat CT scan of the chest, abdomen and pelvis performed few days ago but the results are still pending.  She is here for evaluation before starting cycle #7 of her treatment.  MEDICAL HISTORY: Past Medical History:  Diagnosis Date   Anemia    as a child only   Coronary artery calcification of native artery    Hyperlipidemia    Hypertension    Liver spots    " per pt"   Lung nodule    Wears glasses    Wears partial dentures     ALLERGIES:  is allergic to crab [shellfish allergy] and morphine and related.  MEDICATIONS:  Current Outpatient Medications  Medication Sig Dispense Refill   aspirin EC 81 MG tablet Take 81 mg by mouth at bedtime. Swallow whole.     atorvastatin (LIPITOR) 20 MG tablet Take 1 tablet (20 mg total) by mouth at bedtime. 90 tablet 0   calcium carbonate (OSCAL) 1500 (600 Ca) MG TABS tablet Take 600 mg of elemental calcium by mouth daily. When Holmes to remember     clobetasol ointment (TEMOVATE) 0.05 % Apply 1 application  topically as needed (rash).     folic acid (FOLVITE) 1 MG tablet Take 1 tablet (1 mg total) by mouth daily. 30 tablet 4   furosemide (LASIX) 20 MG tablet Take 20 mg by mouth daily.     lidocaine (LIDODERM) 5 % Place 1 patch onto the skin daily. Remove & Discard patch within 12 hours or as directed by MD 30 patch 0   lisinopril (ZESTRIL) 20 MG tablet Take 20 mg by  mouth in the morning.     magnesium citrate SOLN Take 1 Bottle by mouth once.     MAGNESIUM PO Take 1 tablet by mouth daily. When Holmes to remember     nystatin-triamcinolone ointment (MYCOLOG) Apply 1 application  topically daily.     oxybutynin (DITROPAN) 5 MG tablet Take 5 mg by mouth as needed for bladder spasms.     potassium chloride SA (KLOR-CON M) 20 MEQ tablet Take by mouth daily. Take 1 tablet a day with Calcium and Magnesium when she can remember for leg cramps     pregabalin (LYRICA) 150 MG capsule Take 150 mg by mouth 2 (two)  times daily.     prochlorperazine (COMPAZINE) 10 MG tablet Take 1 tablet (10 mg total) by mouth every 6 (six) hours as needed for nausea or vomiting. 30 tablet 0   tiZANidine (ZANAFLEX) 2 MG tablet Take 2 mg by mouth at bedtime.     traMADol (ULTRAM) 50 MG tablet Take 50 mg by mouth every 6 (six) hours as needed for severe pain. Every 8 hours     No current facility-administered medications for this visit.    SURGICAL HISTORY:  Past Surgical History:  Procedure Laterality Date   ABDOMINAL HYSTERECTOMY  1974   heavy bleeding   ABDOMINAL HYSTERECTOMY     BRONCHIAL BRUSHINGS  08/13/2019   Procedure: BRONCHIAL BRUSHINGS;  Surgeon: Josephine Igo, DO;  [facility];  Service: Pulmonary;;   BRONCHIAL WASHINGS  08/13/2019   Procedure: BRONCHIAL WASHINGS;  Surgeon: Josephine Igo, DO;  [facility];  Service: Pulmonary;;   COLONOSCOPY W/ BIOPSIES AND POLYPECTOMY     DILATION AND CURETTAGE OF UTERUS     FINE NEEDLE ASPIRATION  08/13/2019   Procedure: FINE NEEDLE ASPIRATION (FNA) LINEAR;  Surgeon: Josephine Igo, DO;  [facility];  Service: Pulmonary;;   FOOT SURGERY     bilateral bunions and hammer toes   IR RADIOLOGIST EVAL & MGMT  05/20/2022   LUNG BIOPSY  08/13/2019   Procedure: LUNG BIOPSY;  Surgeon: Josephine Igo, DO;  [facility];  Service: Pulmonary;;   MULTIPLE TOOTH EXTRACTIONS     VIDEO BRONCHOSCOPY WITH ENDOBRONCHIAL NAVIGATION Right 08/13/2019   Procedure: VIDEO BRONCHOSCOPY WITH ENDOBRONCHIAL NAVIGATION;  Surgeon: Josephine Igo, DO;  [facility];  Service: Pulmonary;  Laterality: Right;   VIDEO BRONCHOSCOPY WITH ENDOBRONCHIAL ULTRASOUND Right 08/13/2019   Procedure: VIDEO BRONCHOSCOPY WITH ENDOBRONCHIAL ULTRASOUND;  Surgeon: Josephine Igo, DO;  [facility]: [facility];  Service: Pulmonary;  Laterality: Right;    REVIEW OF SYSTEMS:  Constitutional: positive for fatigue Eyes: negative Ears, nose, mouth, throat, and face:  negative Respiratory: positive for dyspnea on exertion Cardiovascular: negative Gastrointestinal: negative Genitourinary:negative Integument/breast: negative Hematologic/lymphatic: negative Musculoskeletal:positive for back pain Neurological: negative Behavioral/Psych: negative Endocrine: negative Allergic/Immunologic: negative   PHYSICAL EXAMINATION: General appearance: alert, cooperative, fatigued, and no distress Head: Normocephalic, without obvious abnormality, atraumatic Neck: no adenopathy, no JVD, supple, symmetrical, trachea midline, and thyroid not enlarged, symmetric, no tenderness/mass/nodules Lymph nodes: Cervical, supraclavicular, and axillary nodes normal. Resp: clear to auscultation bilaterally Back: symmetric, no curvature. ROM normal. No CVA tenderness. Cardio: regular rate and rhythm, S1, S2 normal, no murmur, click, rub or gallop GI: soft, non-tender; bowel sounds normal; no masses,  no organomegaly Extremities: extremities normal, atraumatic, no cyanosis or edema Neurologic: Alert and oriented X 3, normal strength and tone. Normal symmetric reflexes. Normal coordination and gait  ECOG PERFORMANCE STATUS: 1 - Symptomatic  but completely ambulatory  Blood pressure 128/66, pulse 89, temperature 98.2 F (36.8 C), temperature source Oral, resp. rate 16, weight 160 lb 6.4 oz (72.8 kg), SpO2 97 %.    LABORATORY DATA: Lab Results  Component Value Date   WBC 8.5 08/21/2022   HGB 9.4 (L) 08/21/2022   HCT 28.5 (L) 08/21/2022   MCV 100.4 (H) 08/21/2022   PLT 239 08/21/2022      Chemistry      Component Value Date/Time   NA 141 08/01/2022 1027   NA 142 07/22/2019 1522   K 3.6 08/01/2022 1027   CL 108 08/01/2022 1027   CO2 25 08/01/2022 1027   BUN 17 08/01/2022 1027   BUN 13 07/22/2019 1522   CREATININE 1.58 (H) 08/01/2022 1027   CREATININE 0.91 09/16/2011 1000      Component Value Date/Time   CALCIUM 9.3 08/01/2022 1027   ALKPHOS 73 08/01/2022 1027   AST  26 08/01/2022 1027   ALT 9 08/01/2022 1027   BILITOT 0.6 08/01/2022 1027       RADIOGRAPHIC STUDIES: CT CHEST ABDOMEN PELVIS W CONTRAST  Result Date: 08/21/2022 CLINICAL DATA:  Non-small-cell lung cancer diagnosed 3 years ago with chemotherapy and radiation therapy. Right upper lobectomy. * Tracking Code: BO * EXAM: CT CHEST, ABDOMEN, AND PELVIS WITH CONTRAST TECHNIQUE: Multidetector CT imaging of the chest, abdomen and pelvis was performed following the standard protocol during bolus administration of intravenous contrast. RADIATION DOSE REDUCTION: This exam was performed according to the departmental dose-optimization program which includes automated exposure control, adjustment of the mA and/or kV according to patient size and/or use of iterative reconstruction technique. CONTRAST:  80mL OMNIPAQUE IOHEXOL 300 MG/ML  SOLN COMPARISON:  06/17/2022 FINDINGS: CT CHEST FINDINGS Cardiovascular: Aortic atherosclerosis. Tortuous thoracic aorta. Mild cardiomegaly, with small pericardial effusion, new. Lad and right coronary artery calcification. No central pulmonary embolism, on this non-dedicated study. Mediastinum/Nodes: No supraclavicular adenopathy. No mediastinal or hilar adenopathy. Lungs/Pleura: Tiny bilateral pleural effusions, similar on the right and increased on the left. Right paramediastinal presumed radiation fibrosis. There is also right lower lobe volume loss with airspace disease and bronchiectasis medially which may be radiation induced, similar. Right upper lobectomy. Musculoskeletal: Multifocal osseous metastasis, including a dominant mass centered about the right side of the T9 vertebral body with extension into the adjacent posterior right ribs. This is slightly more sclerotic today, suggesting partial interval healing. Example 33/2. Compression deformities involving T1, T8-T11 are similar. Multiple remote right rib fractures. CT ABDOMEN PELVIS FINDINGS Hepatobiliary: Scattered hepatic cysts  including up to 1.0 cm. No suspicious liver lesion or biliary abnormality. Pancreas: Normal, without mass or ductal dilatation. Spleen: Normal in size, without focal abnormality. Adrenals/Urinary Tract: Normal adrenal glands. Partially calcified inter/lower pole right renal hypoattenuating lesion measures 2.0 x 1.9 cm on 70/2 versus 2.2 x 2.1 cm on the prior. Normal left kidney. No hydronephrosis. Normal urinary bladder. Stomach/Bowel: Normal stomach, without wall thickening. Normal colon, appendix, and terminal ileum. Normal small bowel. Vascular/Lymphatic: Aortic atherosclerosis. No abdominopelvic adenopathy. Reproductive: Hysterectomy.  No adnexal mass. Other: No significant free fluid. No free intraperitoneal air. No evidence of omental or peritoneal disease. Musculoskeletal: Similar multifocal sclerotic osseous metastasis. Dominant L5 lesion of 4.9 cm is unchanged. IMPRESSION: 1. Widespread osseous metastasis, primarily similar. Lower thoracic spine involvement with suggestion of increased sclerosis, possibly representing partial healing. 2. No progressive soft tissue metastasis identified. 3. Status post right upper lobectomy. 4. New small pericardial effusion. Tiny bilateral pleural effusions, similar on the right  and increased on the left. 5. Similar right renal mass which remains suspicious for renal cell carcinoma. 6. Aortic atherosclerosis (ICD10-I70.0), coronary artery atherosclerosis and emphysema (ICD10-J43.9). Electronically Signed   By: Jeronimo Greaves M.D.   On: 08/21/2022 10:30     ASSESSMENT AND PLAN: This is a pleasant 72 years old African-American female diagnosed with metastatic also lung cancer, adenocarcinoma initially diagnosed as stage IIb (T1b, N1, M0) non-small cell lung cancer, adenocarcinoma status post a right upper lobectomy with lymph node dissection on September 30, 2019 in [place].  She declined adjuvant systemic chemotherapy at that time. She was found to have evidence for disease  metastasis in November 2023 presenting with multiple metastatic bone lesions. The patient had molecular studies that showed an actionable mutation with BRAF V600E mutation and PD-L1 expression of 35%. She underwent palliative radiotherapy to the T9 vertebral lesion under the care of Dr. Mitzi Hansen. The patient is currently undergoing first-line combination of systemic chemotherapy with carboplatin for AUC of 5, Alimta 500 Mg/M2 and Keytruda 200 Mg IV every 3 weeks.  Status post 6 cycles.  Starting from cycle #5 she will be on maintenance treatment with Alimta and Keytruda every 3 weeks. The patient has been tolerating her treatment fairly well with no concerning adverse effect except for fatigue. She had repeat CT scan of the chest, abdomen and pelvis performed few days ago but the final report is still pending.  I personally and independently reviewed the scan images in comparison to the previous 1 and I do not see a significant change but I will wait for the final report for confirmation. I recommended for her to continue her current treatment with maintenance Alimta and Keytruda and she will proceed with cycle #7 today. For the pain management she will continue her current treatment with tramadol. The patient was advised to call immediately if she has any other concerning symptoms in the interval. The patient voices understanding of current disease status and treatment options and is in agreement with the current care plan. The total time spent in the appointment was 30 minutes.  All questions were answered. The patient knows to call the clinic with any problems, questions or concerns. We can certainly see the patient much sooner if necessary.  Disclaimer: This note was dictated with voice recognition software. Similar sounding words can inadvertently be transcribed and may not be corrected upon review.

## 2022-08-21 NOTE — Progress Notes (Signed)
1233 Pt states she feels tired and weak, denies dizziness.

## 2022-08-21 NOTE — Progress Notes (Signed)
Okay to treat with creat of 1.52 per [facility]

## 2022-08-21 NOTE — Patient Instructions (Signed)
[place] [facility]  Discharge Instructions: Thank you for choosing [facility] to provide your oncology and hematology care.   If you have a lab appointment with the Cancer Center, please go directly to the Cancer Center and check in at the registration area.   Wear comfortable clothing and clothing appropriate for easy access to any Portacath or PICC line.   We strive to give you quality time with your provider. You may need to reschedule your appointment if you arrive late (15 or more minutes).  Arriving late affects you and other patients whose appointments are after yours.  Also, if you miss three or more appointments without notifying the office, you may be dismissed from the clinic at the provider's discretion.      For prescription refill requests, have your pharmacy contact our office and allow 72 hours for refills to be completed.    Today you received the following chemotherapy and/or immunotherapy agents : Alimta, Keytruda      To help prevent nausea and vomiting after your treatment, we encourage you to take your nausea medication as directed.  BELOW ARE SYMPTOMS THAT SHOULD BE REPORTED IMMEDIATELY: *FEVER GREATER THAN 100.4 F (38 C) OR HIGHER *CHILLS OR SWEATING *NAUSEA AND VOMITING THAT IS NOT CONTROLLED WITH YOUR NAUSEA MEDICATION *UNUSUAL SHORTNESS OF BREATH *UNUSUAL BRUISING OR BLEEDING *URINARY PROBLEMS (pain or burning when urinating, or frequent urination) *BOWEL PROBLEMS (unusual diarrhea, constipation, pain near the anus) TENDERNESS IN MOUTH AND THROAT WITH OR WITHOUT PRESENCE OF ULCERS (sore throat, sores in mouth, or a toothache) UNUSUAL RASH, SWELLING OR PAIN  UNUSUAL VAGINAL DISCHARGE OR ITCHING   Items with * indicate a potential emergency and should be followed up as soon as possible or go to the Emergency Department if any problems should occur.  Please show the CHEMOTHERAPY ALERT CARD or IMMUNOTHERAPY ALERT CARD  at check-in to the Emergency Department and triage nurse.  Should you have questions after your visit or need to cancel or reschedule your appointment, please contact [place] [facility]  Dept: [phone]  and follow the prompts.  Office hours are 8:00 a.m. to 4:30 p.m. Monday - Friday. Please note that voicemails left after 4:00 p.m. may not be returned until the following business day.  We are closed weekends and major holidays. You have access to a nurse at all times for urgent questions. Please call the main number to the clinic Dept: [phone] and follow the prompts.   For any non-urgent questions, you may also contact your provider using MyChart. We now offer e-Visits for anyone 25 and older to request care online for non-urgent symptoms. For details visit [email or web address].   Also download the MyChart app! Go to the app store, search "MyChart", open the app, select , and log in with your MyChart username and password.

## 2022-08-28 ENCOUNTER — Other Ambulatory Visit: Payer: Self-pay

## 2022-08-29 ENCOUNTER — Other Ambulatory Visit: Payer: Self-pay

## 2022-09-02 ENCOUNTER — Other Ambulatory Visit: Payer: Self-pay

## 2022-09-05 ENCOUNTER — Other Ambulatory Visit: Payer: Medicare HMO

## 2022-09-06 ENCOUNTER — Other Ambulatory Visit: Payer: Self-pay

## 2022-09-06 ENCOUNTER — Encounter (HOSPITAL_COMMUNITY): Payer: Self-pay

## 2022-09-06 ENCOUNTER — Inpatient Hospital Stay (HOSPITAL_COMMUNITY)
Admission: EM | Admit: 2022-09-06 | Discharge: 2022-09-09 | DRG: 392 | Disposition: A | Discharge status: Home / Self Care | Payer: Medicare HMO | Attending: Internal Medicine | Admitting: Internal Medicine

## 2022-09-06 ENCOUNTER — Observation Stay (HOSPITAL_COMMUNITY): Payer: Medicare HMO

## 2022-09-06 DIAGNOSIS — I959 Hypotension, unspecified: Secondary | ICD-10-CM

## 2022-09-06 DIAGNOSIS — Z8249 Family history of ischemic heart disease and other diseases of the circulatory system: Secondary | ICD-10-CM

## 2022-09-06 DIAGNOSIS — N179 Acute kidney failure, unspecified: Secondary | ICD-10-CM

## 2022-09-06 DIAGNOSIS — R112 Nausea with vomiting, unspecified: Secondary | ICD-10-CM

## 2022-09-06 DIAGNOSIS — E860 Dehydration: Secondary | ICD-10-CM | POA: Diagnosis not present

## 2022-09-06 DIAGNOSIS — R6510 Systemic inflammatory response syndrome (SIRS) of non-infectious origin without acute organ dysfunction: Secondary | ICD-10-CM

## 2022-09-06 DIAGNOSIS — Z885 Allergy status to narcotic agent status: Secondary | ICD-10-CM

## 2022-09-06 DIAGNOSIS — A080 Rotaviral enteritis: Secondary | ICD-10-CM | POA: Diagnosis not present

## 2022-09-06 DIAGNOSIS — R197 Diarrhea, unspecified: Secondary | ICD-10-CM

## 2022-09-06 DIAGNOSIS — Z902 Acquired absence of lung [part of]: Secondary | ICD-10-CM

## 2022-09-06 DIAGNOSIS — E876 Hypokalemia: Secondary | ICD-10-CM

## 2022-09-06 DIAGNOSIS — I2510 Atherosclerotic heart disease of native coronary artery without angina pectoris: Secondary | ICD-10-CM | POA: Diagnosis present

## 2022-09-06 DIAGNOSIS — Z87891 Personal history of nicotine dependence: Secondary | ICD-10-CM

## 2022-09-06 DIAGNOSIS — E785 Hyperlipidemia, unspecified: Secondary | ICD-10-CM | POA: Diagnosis present

## 2022-09-06 DIAGNOSIS — Z7982 Long term (current) use of aspirin: Secondary | ICD-10-CM

## 2022-09-06 DIAGNOSIS — Z79899 Other long term (current) drug therapy: Secondary | ICD-10-CM

## 2022-09-06 DIAGNOSIS — I10 Essential (primary) hypertension: Secondary | ICD-10-CM

## 2022-09-06 DIAGNOSIS — Z91013 Allergy to seafood: Secondary | ICD-10-CM

## 2022-09-06 DIAGNOSIS — C3411 Malignant neoplasm of upper lobe, right bronchus or lung: Secondary | ICD-10-CM | POA: Diagnosis present

## 2022-09-06 LAB — COMPREHENSIVE METABOLIC PANEL
ALT: 15 U/L (ref 0–44)
AST: 30 U/L (ref 15–41)
Albumin: 3 g/dL — ABNORMAL LOW (ref 3.5–5.0)
Alkaline Phosphatase: 66 U/L (ref 38–126)
Anion gap: 11 (ref 5–15)
BUN: 28 mg/dL — ABNORMAL HIGH (ref 8–23)
CO2: 22 mmol/L (ref 22–32)
Calcium: 8.3 mg/dL — ABNORMAL LOW (ref 8.9–10.3)
Chloride: 105 mmol/L (ref 98–111)
Creatinine, Ser: 1.97 mg/dL — ABNORMAL HIGH (ref 0.44–1.00)
GFR, Estimated: 26 mL/min — ABNORMAL LOW (ref >60)
Glucose, Bld: 87 mg/dL (ref 70–99)
Potassium: 3.3 mmol/L — ABNORMAL LOW (ref 3.5–5.1)
Sodium: 138 mmol/L (ref 135–145)
Total Bilirubin: 0.9 mg/dL (ref 0.3–1.2)
Total Protein: 6.5 g/dL (ref 6.5–8.1)

## 2022-09-06 LAB — CBC WITH DIFFERENTIAL/PLATELET
Abs Immature Granulocytes: 0.07 10*3/uL (ref 0.00–0.07)
Basophils Absolute: 0 10*3/uL (ref 0.0–0.1)
Basophils Relative: 0 %
Eosinophils Absolute: 0.8 10*3/uL — ABNORMAL HIGH (ref 0.0–0.5)
Eosinophils Relative: 5 %
HCT: 32.9 % — ABNORMAL LOW (ref 36.0–46.0)
Hemoglobin: 10.2 g/dL — ABNORMAL LOW (ref 12.0–15.0)
Immature Granulocytes: 1 %
Lymphocytes Relative: 5 %
Lymphs Abs: 0.8 10*3/uL (ref 0.7–4.0)
MCH: 32.8 pg (ref 26.0–34.0)
MCHC: 31 g/dL (ref 30.0–36.0)
MCV: 105.8 fL — ABNORMAL HIGH (ref 80.0–100.0)
Monocytes Absolute: 2.1 10*3/uL — ABNORMAL HIGH (ref 0.1–1.0)
Monocytes Relative: 14 %
Neutro Abs: 11.3 10*3/uL — ABNORMAL HIGH (ref 1.7–7.7)
Neutrophils Relative %: 75 %
Platelets: 208 10*3/uL (ref 150–400)
RBC: 3.11 MIL/uL — ABNORMAL LOW (ref 3.87–5.11)
RDW: 15.5 % (ref 11.5–15.5)
WBC: 15.1 10*3/uL — ABNORMAL HIGH (ref 4.0–10.5)
nRBC: 0 % (ref 0.0–0.2)

## 2022-09-06 LAB — LIPASE, BLOOD: Lipase: 21 U/L (ref 11–51)

## 2022-09-06 LAB — LACTIC ACID, PLASMA: Lactic Acid, Venous: 2.1 mmol/L (ref 0.5–1.9)

## 2022-09-06 MED ORDER — LACTATED RINGERS IV SOLN: INTRAVENOUS | Status: DC

## 2022-09-06 MED ORDER — ONDANSETRON HCL 4 MG/2ML IJ SOLN
4.0000 mg | Freq: Four times a day (QID) | INTRAMUSCULAR | Status: DC | PRN
  Administered 2022-09-07: 4 mg via INTRAVENOUS
  Filled 2022-09-06: qty 2

## 2022-09-06 MED ORDER — LACTATED RINGERS IV BOLUS
1000.0000 mL | Freq: Once | INTRAVENOUS | Status: AC
  Administered 2022-09-06: 1000 mL via INTRAVENOUS

## 2022-09-06 MED ORDER — ACETAMINOPHEN 650 MG RE SUPP: 650.0000 mg | Freq: Four times a day (QID) | RECTAL | Status: DC | PRN

## 2022-09-06 MED ORDER — ACETAMINOPHEN 325 MG PO TABS: 650.0000 mg | ORAL_TABLET | Freq: Four times a day (QID) | ORAL | Status: DC | PRN

## 2022-09-06 MED ORDER — FENTANYL CITRATE PF 50 MCG/ML IJ SOSY
50.0000 ug | PREFILLED_SYRINGE | Freq: Once | INTRAMUSCULAR | Status: AC
  Administered 2022-09-06: 50 ug via INTRAVENOUS
  Filled 2022-09-06: qty 1

## 2022-09-06 MED ORDER — ONDANSETRON HCL 4 MG/2ML IJ SOLN
4.0000 mg | Freq: Once | INTRAMUSCULAR | Status: AC
  Administered 2022-09-06: 4 mg via INTRAVENOUS
  Filled 2022-09-06: qty 2

## 2022-09-06 MED ORDER — POTASSIUM CHLORIDE 10 MEQ/100ML IV SOLN
10.0000 meq | INTRAVENOUS | Status: AC
  Administered 2022-09-07 (×4): 10 meq via INTRAVENOUS
  Filled 2022-09-06 (×3): qty 100

## 2022-09-06 NOTE — ED Notes (Signed)
ED TO INPATIENT HANDOFF REPORT  ED Nurse Name and Phone #:  Edyth Gunnels RN [phone]  S Name/Age/Gender Brandi Holmes 72 y.o. female Room/Bed: WA05/WA05  Code Status   Code Status: Prior  Home/SNF/Other Home Patient oriented to: self, place, time, and situation Is this baseline? Yes   Triage Complete: Triage complete  Chief Complaint Nausea, vomiting and diarrhea [R11.2, R19.7]  Triage Note Patient reports N/V/D x 3 days. Also reports right sided pain, similar to her chronic pain. Patient has been unable to take any of her medications.  Patient's BP on arrival was 77/59.    Allergies Allergies  Allergen Reactions   Crab [Shellfish Allergy] Swelling    Swelling of feet   Morphine And Codeine Other (See Comments)    Nightmares     Level of Care/Admitting Diagnosis ED Disposition     ED Disposition  Admit   Condition  --   Comment  Hospital Area: [facility] [[id number]]  Level of Care: Progressive [102]  Admit to Progressive based on following criteria: Other see comments  Comments: Hypotension  May place patient in observation at [facility] or [facility] if equivalent level of care is available:: Yes  Covid Evaluation: Asymptomatic - no recent exposure (last 10 days) testing not required  Diagnosis: Nausea, vomiting and diarrhea [034742]  Admitting Physician: John Giovanni [[id number]]  Attending Physician: John Giovanni [[id number]]          B Medical/Surgery History Past Medical History:  Diagnosis Date   Anemia    as a child only   Coronary artery calcification of native artery    Hyperlipidemia    Hypertension    Liver spots    " per pt"   Lung nodule    Wears glasses    Wears partial dentures    Past Surgical History:  Procedure Laterality Date   ABDOMINAL HYSTERECTOMY  1974   heavy bleeding   ABDOMINAL HYSTERECTOMY     BRONCHIAL BRUSHINGS  08/13/2019   Procedure: BRONCHIAL BRUSHINGS;  Surgeon: Josephine Igo,  DO;  Location: [facility];  Service: Pulmonary;;   BRONCHIAL WASHINGS  08/13/2019   Procedure: BRONCHIAL WASHINGS;  Surgeon: Josephine Igo, DO;  Location: [facility];  Service: Pulmonary;;   COLONOSCOPY W/ BIOPSIES AND POLYPECTOMY     DILATION AND CURETTAGE OF UTERUS     FINE NEEDLE ASPIRATION  08/13/2019   Procedure: FINE NEEDLE ASPIRATION (FNA) LINEAR;  Surgeon: Josephine Igo, DO;  Location: [facility];  Service: Pulmonary;;   FOOT SURGERY     bilateral bunions and hammer toes   IR RADIOLOGIST EVAL & MGMT  05/20/2022   LUNG BIOPSY  08/13/2019   Procedure: LUNG BIOPSY;  Surgeon: Josephine Igo, DO;  Location: [facility];  Service: Pulmonary;;   MULTIPLE TOOTH EXTRACTIONS     VIDEO BRONCHOSCOPY WITH ENDOBRONCHIAL NAVIGATION Right 08/13/2019   Procedure: VIDEO BRONCHOSCOPY WITH ENDOBRONCHIAL NAVIGATION;  Surgeon: Josephine Igo, DO;  Location: [facility];  Service: Pulmonary;  Laterality: Right;   VIDEO BRONCHOSCOPY WITH ENDOBRONCHIAL ULTRASOUND Right 08/13/2019   Procedure: VIDEO BRONCHOSCOPY WITH ENDOBRONCHIAL ULTRASOUND;  Surgeon: Josephine Igo, DO;  Location: [facility];  Service: Pulmonary;  Laterality: Right;     A IV Location/Drains/Wounds Patient Lines/Drains/Airways Status     Active Line/Drains/Airways     Name Placement date Placement time Site Days   Peripheral IV 09/06/22 22 G Anterior;Left Wrist 09/06/22  1935  Wrist  less than 1  Intake/Output Last 24 hours No intake or output data in the 24 hours ending 09/06/22 2305  Labs/Imaging Results for orders placed or performed during the hospital encounter of 09/06/22 (from the past 48 hour(s))  CBC with Differential/Platelet     Status: Abnormal   Collection Time: 09/06/22  9:26 PM  Result Value Ref Range   WBC 15.1 (H) 4.0 - 10.5 K/uL   RBC 3.11 (L) 3.87 - 5.11 MIL/uL   Hemoglobin 10.2 (L) 12.0 - 15.0 g/dL   HCT 19.1 (L) 47.8 - 29.5 %   MCV 105.8 (H) 80.0 - 100.0 fL   MCH 32.8 26.0 -  34.0 pg   MCHC 31.0 30.0 - 36.0 g/dL   RDW 62.1 30.8 - 65.7 %   Platelets 208 150 - 400 K/uL   nRBC 0.0 0.0 - 0.2 %   Neutrophils Relative % 75 %   Neutro Abs 11.3 (H) 1.7 - 7.7 K/uL   Lymphocytes Relative 5 %   Lymphs Abs 0.8 0.7 - 4.0 K/uL   Monocytes Relative 14 %   Monocytes Absolute 2.1 (H) 0.1 - 1.0 K/uL   Eosinophils Relative 5 %   Eosinophils Absolute 0.8 (H) 0.0 - 0.5 K/uL   Basophils Relative 0 %   Basophils Absolute 0.0 0.0 - 0.1 K/uL   Immature Granulocytes 1 %   Abs Immature Granulocytes 0.07 0.00 - 0.07 K/uL    Comment: Performed at [facility], [address], [place], [place] [address]  Comprehensive metabolic panel     Status: Abnormal   Collection Time: 09/06/22  9:26 PM  Result Value Ref Range   Sodium 138 135 - 145 mmol/L   Potassium 3.3 (L) 3.5 - 5.1 mmol/L   Chloride 105 98 - 111 mmol/L   CO2 22 22 - 32 mmol/L   Glucose, Bld 87 70 - 99 mg/dL    Comment: Glucose reference range applies only to samples taken after fasting for at least 8 hours.   BUN 28 (H) 8 - 23 mg/dL   Creatinine, Ser 2.95 (H) 0.44 - 1.00 mg/dL   Calcium 8.3 (L) 8.9 - 10.3 mg/dL   Total Protein 6.5 6.5 - 8.1 g/dL   Albumin 3.0 (L) 3.5 - 5.0 g/dL   AST 30 15 - 41 U/L   ALT 15 0 - 44 U/L   Alkaline Phosphatase 66 38 - 126 U/L   Total Bilirubin 0.9 0.3 - 1.2 mg/dL   GFR, Estimated 26 (L) >60 mL/min    Comment: (NOTE) Calculated using the CKD-EPI Creatinine Equation (2021)    Anion gap 11 5 - 15    Comment: Performed at [facility], [address] [place], [place] [address]  Lipase, blood     Status: None   Collection Time: 09/06/22  9:26 PM  Result Value Ref Range   Lipase 21 11 - 51 U/L    Comment: Performed at [facility], [address], [place], [place] [address]  Lactic acid, plasma     Status: Abnormal   Collection Time: 09/06/22  9:26 PM  Result Value Ref Range   Lactic Acid, Venous 2.1 (HH) 0.5 - 1.9 mmol/L     Comment: CRITICAL RESULT CALLED TO, READ BACK BY AND VERIFIED WITH Betsaida Missouri, B RN @ 2203 09/06/22. GILBERTL Performed at [facility], [address], [place], [place] [address]    No results found.  Pending Labs [employer] (From admission, onward)  Start     Ordered   09/06/22 1925  Lactic acid, plasma  Now then every 2 hours,   R      09/06/22 1924            Vitals/Pain Today's Vitals   09/06/22 1915 09/06/22 2015 09/06/22 2202 09/06/22 2302  BP:  (!) 105/94 91/62   Pulse:  96 90   Resp:  16 17   Temp:    98 F (36.7 C)  TempSrc:      SpO2:  95% 96%   Weight: 72.7 kg     Height: 5\' 6"  (1.676 m)     PainSc: 7        Isolation Precautions No active isolations  Medications Medications  lactated ringers infusion (has no administration in time range)  lactated ringers bolus 1,000 mL (1,000 mLs Intravenous New Bag/Given 09/06/22 1938)  ondansetron (ZOFRAN) injection 4 mg (4 mg Intravenous Given 09/06/22 1938)  fentaNYL (SUBLIMAZE) injection 50 mcg (50 mcg Intravenous Given 09/06/22 2130)  lactated ringers bolus 1,000 mL (1,000 mLs Intravenous New Bag/Given 09/06/22 2243)    Mobility walks     Focused Assessments     R Recommendations: See Admitting Provider Note  Report given to:   Additional Notes:

## 2022-09-06 NOTE — ED Provider Notes (Signed)
[place] [facility] AT [facility] Provider Note   CSN: [id number] Arrival date & time: 09/06/22  1859     History  Chief Complaint  Patient presents with   Emesis    Brandi Holmes is a 72 y.o. female.  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  Symptoms started on Wednesday night and has been persistent.  Patient has been unable to hold down any food or water.  She has vomited more than 10 times and having very watery green diarrhea.  There is no blood in her emesis or stool.  She has not had any new cough, fever, chest pain and denies any abdominal pain.  She did not start any chemo that she is aware of and denies any bad food exposure or recent antibiotics.  She denies ever being sick from her chemo in the past.  The history is provided by the patient.  Emesis      Home Medications Prior to Admission medications   Medication Sig Start Date End Date Taking? Authorizing Provider  aspirin EC 81 MG tablet Take 81 mg by mouth at bedtime. Swallow whole.    [provider]  atorvastatin (LIPITOR) 20 MG tablet Take 1 tablet (20 mg total) by mouth at bedtime. 08/22/20 03/21/22  Tolia, Sunit, DO  calcium carbonate (OSCAL) 1500 (600 Ca) MG TABS tablet Take 600 mg of elemental calcium by mouth daily. When able to remember    [provider]  clobetasol ointment (TEMOVATE) 0.05 % Apply 1 application  topically as needed (rash). 06/06/21   [provider]  folic acid (FOLVITE) 1 MG tablet Take 1 tablet (1 mg total) by mouth daily. 08/14/22   Heilingoetter, Cassandra L, PA-C  furosemide (LASIX) 20 MG tablet Take 20 mg by mouth daily. 07/01/22   [provider]  lidocaine (LIDODERM) 5 % Place 1 patch onto the skin daily. Remove & Discard patch within 12 hours or as directed  by MD 07/25/22   Heilingoetter, Cassandra L, PA-C  lisinopril (ZESTRIL) 20 MG tablet Take 20 mg by mouth in the morning.    [provider]  magnesium citrate SOLN Take 1 Bottle by mouth once.    [provider]  MAGNESIUM PO Take 1 tablet by mouth daily. When able to remember    [provider]  nystatin-triamcinolone ointment (MYCOLOG) Apply 1 application  topically daily.    [provider]  oxybutynin (DITROPAN) 5 MG tablet Take 5 mg by mouth as needed for bladder spasms.    [provider]  potassium chloride SA (KLOR-CON M) 20 MEQ tablet Take by mouth daily. Take 1 tablet a day with Calcium and Magnesium when she can remember for leg cramps    [provider]  pregabalin (LYRICA) 150 MG capsule Take 150 mg by mouth 2 (two) times daily. 07/08/22   [provider]  prochlorperazine (COMPAZINE) 10 MG tablet Take 1 tablet (10 mg total) by mouth every 6 (six) hours as needed for nausea or vomiting. 04/10/22   Si Gaul, MD  tiZANidine (ZANAFLEX) 2 MG tablet Take 2 mg by mouth at bedtime. 07/05/21   [provider]  traMADol HCl 100 MG TABS Take 1 tablet by mouth 4 (four) times daily. 08/08/22   [provider]      Allergies    Parke Simmers  allergy] and Morphine and codeine    Review of Systems   Review of Systems  Gastrointestinal:  Positive for vomiting.    Physical Exam Updated Vital Signs BP 91/62   Pulse 90   Temp 98 F (36.7 C) (Oral)   Resp 17   Ht 5\' 6"  (1.676 m)   Wt 72.7 kg   SpO2 96%   BMI 25.87 kg/m  Physical Exam Vitals and nursing note reviewed.  Constitutional:      General: She is not in acute distress.    Appearance: She is well-developed.  HENT:     Head: Normocephalic and atraumatic.     Mouth/Throat:     Mouth: Mucous membranes are dry.  Eyes:     Pupils: Pupils are equal, round, and reactive to light.  Cardiovascular:     Rate and Rhythm: Regular rhythm.  Tachycardia present.     Heart sounds: Normal heart sounds. No murmur heard.    No friction rub.  Pulmonary:     Effort: Pulmonary effort is normal.     Breath sounds: Normal breath sounds. No wheezing or rales.  Abdominal:     General: Bowel sounds are normal. There is no distension.     Palpations: Abdomen is soft.     Tenderness: There is no abdominal tenderness. There is no guarding or rebound.  Musculoskeletal:        General: No tenderness. Normal range of motion.     Comments: No edema  Skin:    General: Skin is warm and dry.     Coloration: Skin is pale.     Findings: No rash.  Neurological:     Mental Status: She is alert and oriented to person, place, and time.     Cranial Nerves: No cranial nerve deficit.  Psychiatric:        Behavior: Behavior normal.     ED Results / Procedures / Treatments   Labs (all labs ordered are listed, but only abnormal results are displayed) Labs Reviewed  CBC WITH DIFFERENTIAL/PLATELET - Abnormal; Notable for the following components:      Result Value   WBC 15.1 (*)    RBC 3.11 (*)    Hemoglobin 10.2 (*)    HCT 32.9 (*)    MCV 105.8 (*)    Neutro Abs 11.3 (*)    Monocytes Absolute 2.1 (*)    Eosinophils Absolute 0.8 (*)    All other components within normal limits  COMPREHENSIVE METABOLIC PANEL - Abnormal; Notable for the following components:   Potassium 3.3 (*)    BUN 28 (*)    Creatinine, Ser 1.97 (*)    Calcium 8.3 (*)    Albumin 3.0 (*)    GFR, Estimated 26 (*)    All other components within normal limits  LACTIC ACID, PLASMA - Abnormal; Notable for the following components:   Lactic Acid, Venous 2.1 (*)    All other components within normal limits  LIPASE, BLOOD  LACTIC ACID, PLASMA    EKG None  Radiology No results found.  Procedures Procedures    Medications Ordered in ED Medications  lactated ringers infusion (has no administration in time range)  lactated ringers bolus 1,000 mL (has no  administration in time range)  lactated ringers bolus 1,000 mL (1,000 mLs Intravenous New Bag/Given 09/06/22 1938)  ondansetron (ZOFRAN) injection 4 mg (4 mg Intravenous Given 09/06/22 1938)  fentaNYL (SUBLIMAZE) injection 50 mcg (50 mcg Intravenous Given 09/06/22 2130)    ED  Course/ Medical Decision Making/ A&P                             Medical Decision Making Amount and/or Complexity of Data Reviewed Labs: ordered. Decision-making details documented in ED Course. ECG/medicine tests: ordered and independent interpretation performed. Decision-making details documented in ED Course.  Risk Prescription drug management. Decision regarding hospitalization.   Pt with multiple medical problems and comorbidities and presenting today with a complaint that caries a high risk for morbidity and mortality.  Here today with persistent nausea vomiting and diarrhea.  Possible side effect of chemo medication versus viral etiology.  Lower suspicion for C. difficile or bad food exposure based on patient's report.  Patient upon arrival was hypotensive and tachycardic.  With laying down blood pressure did improve to the 90s.  Patient appears dehydrated concern for AKI, also pancreatitis, electrolyte abnormality such as hypokalemia.  Will give IV fluids labs pending.  Patient has no focal abdominal pain at this time and lower suspicion for obstruction.  10:20 PM Interpreted patient's labs and see BC today with a leukocytosis of 15, hemoglobin stable at 10, platelet count normal, CMP with AKI today with creatinine 1.9 from baseline of 1.5 and minimal hypokalemia of 3.3 anion gap is normal and lipase is within normal limits.  Lactic acid mildly elevated 2.1.  On repeat evaluation patient is still feeling extremely weak.  She tried to get up and felt very dizzy blood pressure dropped to the 90s.  Initially had improved to the low 100s.  Will give patient a second liter of fluid.  However given her symptoms and above  abnormal labs feel that patient would benefit from admission, ongoing fluids ensuring nausea vomiting and diarrhea are controlled and renal function is improving prior to discharge.  Consulted the hospitalist for admission.         Final Clinical Impression(s) / ED Diagnoses Final diagnoses:  Nausea vomiting and diarrhea  AKI (acute kidney injury) (HCC)  Dehydration  Hypotension, unspecified hypotension type    Rx / DC Orders ED Discharge Orders     None         Gwyneth Sprout, MD 09/06/22 2220

## 2022-09-06 NOTE — H&P (Addendum)
History and Physical    Brandi Holmes WGN:[id number] DOB: [birth year] DOA: 09/06/2022  PCP: [name], MD  Patient coming from: Home  Chief Complaint: Vomiting and diarrhea  HPI: Brandi Holmes is a 72 y.o. female with medical history significant of metastatic non-small cell lung cancer status post right upper lobectomy currently on chemotherapy and immunotherapy, CAD, hypertension, hyperlipidemia, anemia presented to the ED with complaints of vomiting and diarrhea.  Tachycardic to the 110s on arrival to the ED and hypotensive with systolic in the 70s.  Afebrile.  Labs showing WBC 15.1, hemoglobin 10.2 (stable), platelet count 208k, sodium 138, potassium 3.3, chloride 105, bicarb 22, BUN 28, creatinine 1.9 (baseline 1.0), glucose 87, normal lipase and LFTs, lactic acid 2.1.  Patient received fentanyl, Zofran, and 2 L LR boluses.  Tachycardia resolved and blood pressure improved but patient continued to feel very weak when trying to stand up.  [facility] called to admit.    Patient states she has been on chemotherapy since January and has never felt so bad.  Last chemo treatment was 2 weeks ago and she normally does not experience any nausea or vomiting related to this treatment.  Yesterday while at a grocery store she felt nauseous and started vomiting.  She also started having diarrhea.  Vomited is yellow in color and diarrhea is watery and bright green in color.  She has had multiple episodes of vomiting and diarrhea since yesterday.  Patient states her abdomen feels tight but she is not having any pain unless someone pushes on it.  She was not able to tolerate p.o. intake at home or take her meds.  Finally after receiving antiemetic medication in the ED she was able to eat a small cup of applesauce and is requesting ice chips that her mouth feels dry.  Patient states she has felt very weak since yesterday and feels dizzy whenever she tries to get up.  Denies fevers or chills.  Denies  trying any new food or eating at a restaurant.  Denies recent antibiotic use.  She lives alone and has not been exposed to anyone with similar symptoms.  No other complaints.  Denies cough, shortness breath, or chest pain.  Denies any urinary symptoms.  Review of Systems:  Review of Systems  All other systems reviewed and are negative.   Past Medical History:  Diagnosis Date   Anemia    as a child only   Coronary artery calcification of native artery    Hyperlipidemia    Hypertension    Liver spots    " per pt"   Lung nodule    Wears glasses    Wears partial dentures     Past Surgical History:  Procedure Laterality Date   ABDOMINAL HYSTERECTOMY  1974   heavy bleeding   ABDOMINAL HYSTERECTOMY     BRONCHIAL BRUSHINGS  08/13/2019   Procedure: BRONCHIAL BRUSHINGS;  Surgeon: Josephine Igo, DO;  Location: [facility];  Service: Pulmonary;;   BRONCHIAL WASHINGS  08/13/2019   Procedure: BRONCHIAL WASHINGS;  Surgeon: Josephine Igo, DO;  Location: [facility];  Service: Pulmonary;;   COLONOSCOPY W/ BIOPSIES AND POLYPECTOMY     DILATION AND CURETTAGE OF UTERUS     FINE NEEDLE ASPIRATION  08/13/2019   Procedure: FINE NEEDLE ASPIRATION (FNA) LINEAR;  Surgeon: Josephine Igo, DO;  Location: [facility];  Service: Pulmonary;;   FOOT SURGERY     bilateral bunions and hammer toes   IR RADIOLOGIST EVAL & MGMT  05/20/2022   LUNG BIOPSY  08/13/2019   Procedure: LUNG BIOPSY;  Surgeon: Josephine Igo, DO;  Location: [facility];  Service: Pulmonary;;   MULTIPLE TOOTH EXTRACTIONS     VIDEO BRONCHOSCOPY WITH ENDOBRONCHIAL NAVIGATION Right 08/13/2019   Procedure: VIDEO BRONCHOSCOPY WITH ENDOBRONCHIAL NAVIGATION;  Surgeon: Josephine Igo, DO;  Location: [facility];  Service: Pulmonary;  Laterality: Right;   VIDEO BRONCHOSCOPY WITH ENDOBRONCHIAL ULTRASOUND Right 08/13/2019   Procedure: VIDEO BRONCHOSCOPY WITH ENDOBRONCHIAL ULTRASOUND;  Surgeon: Josephine Igo, DO;  Location: [facility];   Service: Pulmonary;  Laterality: Right;     reports that she quit smoking about 8 years ago. Her smoking use included cigarettes. She has a 15.00 pack-year smoking history. She has never used smokeless tobacco. She reports that she does not currently use alcohol. She reports that she does not currently use drugs.  Allergies  Allergen Reactions   Crab [Shellfish Allergy] Swelling    Swelling of feet   Morphine And Codeine Other (See Comments)    Nightmares     Family History  Problem Relation Age of Onset   Heart disease Mother 67       bypass at age    Hypertension Mother    Pneumonia Brother    Cancer Paternal Aunt    Diabetes Neg Hx    Stroke Neg Hx     Prior to Admission medications   Medication Sig Start Date End Date Taking? Authorizing Provider  aspirin EC 81 MG tablet Take 81 mg by mouth at bedtime. Swallow whole.    [provider]  atorvastatin (LIPITOR) 20 MG tablet Take 1 tablet (20 mg total) by mouth at bedtime. 08/22/20 03/21/22  Tolia, Sunit, DO  calcium carbonate (OSCAL) 1500 (600 Ca) MG TABS tablet Take 600 mg of elemental calcium by mouth daily. When able to remember    [provider]  clobetasol ointment (TEMOVATE) 0.05 % Apply 1 application  topically as needed (rash). 06/06/21   [provider]  folic acid (FOLVITE) 1 MG tablet Take 1 tablet (1 mg total) by mouth daily. 08/14/22   Heilingoetter, Cassandra L, PA-C  furosemide (LASIX) 20 MG tablet Take 20 mg by mouth daily. 07/01/22   [provider]  lidocaine (LIDODERM) 5 % Place 1 patch onto the skin daily. Remove & Discard patch within 12 hours or as directed by MD 07/25/22   Heilingoetter, Cassandra L, PA-C  lisinopril (ZESTRIL) 20 MG tablet Take 20 mg by mouth in the morning.    [provider]  magnesium citrate SOLN Take 1 Bottle by mouth once.    [provider]  MAGNESIUM PO Take 1 tablet by mouth daily. When able to remember    [provider]   nystatin-triamcinolone ointment (MYCOLOG) Apply 1 application  topically daily.    [provider]  oxybutynin (DITROPAN) 5 MG tablet Take 5 mg by mouth as needed for bladder spasms.    [provider]  potassium chloride SA (KLOR-CON M) 20 MEQ tablet Take by mouth daily. Take 1 tablet a day with Calcium and Magnesium when she can remember for leg cramps    [provider]  pregabalin (LYRICA) 150 MG capsule Take 150 mg by mouth 2 (two) times daily. 07/08/22   [provider]  prochlorperazine (COMPAZINE) 10 MG tablet Take 1 tablet (10 mg total) by mouth every 6 (six) hours as needed for nausea or vomiting. 04/10/22   Si Gaul, MD  tiZANidine (ZANAFLEX) 2 MG  tablet Take 2 mg by mouth at bedtime. 07/05/21   [provider]  traMADol HCl 100 MG TABS Take 1 tablet by mouth 4 (four) times daily. 08/08/22   [provider]    Physical Exam: Vitals:   09/06/22 1915 09/06/22 2015 09/06/22 2202 09/06/22 2302  BP:  (!) 105/94 91/62   Pulse:  96 90   Resp:  16 17   Temp:    98 F (36.7 C)  TempSrc:      SpO2:  95% 96%   Weight: 72.7 kg     Height: 5\' 6"  (1.676 m)       Physical Exam Vitals reviewed.  Constitutional:      General: She is not in acute distress. HENT:     Head: Normocephalic and atraumatic.  Eyes:     Extraocular Movements: Extraocular movements intact.  Cardiovascular:     Rate and Rhythm: Normal rate and regular rhythm.     Pulses: Normal pulses.  Pulmonary:     Effort: Pulmonary effort is normal. No respiratory distress.     Breath sounds: Normal breath sounds. No wheezing or rales.  Abdominal:     General: Bowel sounds are normal. There is no distension.     Palpations: Abdomen is soft.     Tenderness: There is abdominal tenderness. There is guarding. There is no rebound.     Comments: Generalized tenderness to palpation with guarding  Musculoskeletal:     Cervical back: Normal range of motion.     Right  lower leg: No edema.     Left lower leg: No edema.  Skin:    General: Skin is warm and dry.  Neurological:     General: No focal deficit present.     Mental Status: She is alert and oriented to person, place, and time.     Labs on Admission: I have personally reviewed following labs and imaging studies  CBC: Recent Labs  Lab 09/06/22 2126  WBC 15.1*  NEUTROABS 11.3*  HGB 10.2*  HCT 32.9*  MCV 105.8*  PLT 208   Basic Metabolic Panel: Recent Labs  Lab 09/06/22 2126  NA 138  K 3.3*  CL 105  CO2 22  GLUCOSE 87  BUN 28*  CREATININE 1.97*  CALCIUM 8.3*   GFR: Estimated Creatinine Clearance: 25.6 mL/min (A) (by C-G formula based on SCr of 1.97 mg/dL (H)). Liver Function Tests: Recent Labs  Lab 09/06/22 2126  AST 30  ALT 15  ALKPHOS 66  BILITOT 0.9  PROT 6.5  ALBUMIN 3.0*   Recent Labs  Lab 09/06/22 2126  LIPASE 21   No results for input(s): "AMMONIA" in the last 168 hours. Coagulation Profile: No results for input(s): "INR", "PROTIME" in the last 168 hours. Cardiac Enzymes: No results for input(s): "CKTOTAL", "CKMB", "CKMBINDEX", "TROPONINI" in the last 168 hours. BNP (last 3 results) No results for input(s): "PROBNP" in the last 8760 hours. HbA1C: No results for input(s): "HGBA1C" in the last 72 hours. CBG: No results for input(s): "GLUCAP" in the last 168 hours. Lipid Profile: No results for input(s): "CHOL", "HDL", "LDLCALC", "TRIG", "CHOLHDL", "LDLDIRECT" in the last 72 hours. Thyroid Function Tests: No results for input(s): "TSH", "T4TOTAL", "FREET4", "T3FREE", "THYROIDAB" in the last 72 hours. Anemia Panel: No results for input(s): "VITAMINB12", "FOLATE", "FERRITIN", "TIBC", "IRON", "RETICCTPCT" in the last 72 hours. Urine analysis:    Component Value Date/Time   COLORURINE STRAW (A) 03/07/2022 1524   APPEARANCEUR CLEAR 03/07/2022 1524   LABSPEC  1.004 (L) 03/07/2022 1524   PHURINE 5.0 03/07/2022 1524   GLUCOSEU NEGATIVE 03/07/2022 1524    HGBUR SMALL (A) 03/07/2022 1524   BILIRUBINUR NEGATIVE 03/07/2022 1524   KETONESUR NEGATIVE 03/07/2022 1524   PROTEINUR NEGATIVE 03/07/2022 1524   UROBILINOGEN 0.2 06/14/2010 1748   NITRITE NEGATIVE 03/07/2022 1524   LEUKOCYTESUR TRACE (A) 03/07/2022 1524    Radiological Exams on Admission: No results found.  Assessment and Plan  Nausea, vomiting, diarrhea SIRS/possible sepsis Patient with history of lung cancer on chemotherapy and immunotherapy presenting with 2 day history of intractable nausea and vomiting, diarrhea.  She describes the diarrhea as watery and bright green in color.  Denies recent antibiotic use.  Patient tachycardic and hypotensive on arrival to the ED.  WBC count 15.1, lactate 2.1.  Normal lipase and LFTs.  No fever.  On abdominal exam, she had generalized tenderness to palpation with guarding. ?Viral gastroenteritis versus ?C. difficile colitis.  Symptoms less likely to be related to chemotherapy as patient states her last treatment was 2 weeks ago.  Blood pressure improved and tachycardia resolved after 2 L IV fluid boluses.  She was able to eat a small cup of applesauce in the ED.  Continue IV fluid hydration, antiemetic as needed (EKG ordered to check QT interval), and pain management.  Trend lactate and WBC count.  C. difficile PCR and GI pathogen panel, enteric precautions.  CT abdomen pelvis without contrast ordered.  Addendum: Blood cultures ordered.  AKI Likely prerenal from dehydration.  BUN 28, creatinine 1.9 (baseline 1.0).  Continue IV fluid hydration and monitor renal function.  Avoid nephrotoxic agents/IV contrast.  Hold home lisinopril and Lasix.  Mild hypokalemia Monitor potassium and magnesium levels, continue to replace as needed.  Metastatic non-small cell lung cancer Status post right upper lobectomy currently on chemotherapy and immunotherapy.  Outpatient oncology follow-up.  Hypertension Avoid antihypertensives at this time.  CAD Nuclear  medicine stress test done in June 2022 was low risk.  Patient is not endorsing any chest pain.  EKG ordered.  DVT prophylaxis: SCDs until CT abdomen pelvis is done Code Status: Full Code (discussed with the patient) Family Communication: No family available at this time. Level of care: Progressive Care Unit Admission status: It is my clinical opinion that referral for OBSERVATION is reasonable and necessary in this patient based on the above information provided. The aforementioned taken together are felt to place the patient at high risk for further clinical deterioration. However, it is anticipated that the patient may be medically stable for discharge from the hospital within 24 to 48 hours.    John Giovanni MD [facility]  If 7PM-7AM, please contact night-coverage [email or web address]  09/06/2022, 11:09 PM

## 2022-09-06 NOTE — ED Triage Notes (Signed)
Patient reports N/V/D x 3 days. Also reports right sided pain, similar to her chronic pain. Patient has been unable to take any of her medications.  Patient's BP on arrival was 77/59.

## 2022-09-07 DIAGNOSIS — R112 Nausea with vomiting, unspecified: Secondary | ICD-10-CM | POA: Diagnosis not present

## 2022-09-07 DIAGNOSIS — Z91013 Allergy to seafood: Secondary | ICD-10-CM | POA: Diagnosis not present

## 2022-09-07 DIAGNOSIS — Z8249 Family history of ischemic heart disease and other diseases of the circulatory system: Secondary | ICD-10-CM | POA: Diagnosis not present

## 2022-09-07 DIAGNOSIS — N179 Acute kidney failure, unspecified: Secondary | ICD-10-CM | POA: Diagnosis not present

## 2022-09-07 DIAGNOSIS — Z885 Allergy status to narcotic agent status: Secondary | ICD-10-CM | POA: Diagnosis not present

## 2022-09-07 DIAGNOSIS — I10 Essential (primary) hypertension: Secondary | ICD-10-CM | POA: Diagnosis present

## 2022-09-07 DIAGNOSIS — C3411 Malignant neoplasm of upper lobe, right bronchus or lung: Secondary | ICD-10-CM | POA: Diagnosis not present

## 2022-09-07 DIAGNOSIS — Z902 Acquired absence of lung [part of]: Secondary | ICD-10-CM | POA: Diagnosis not present

## 2022-09-07 DIAGNOSIS — A080 Rotaviral enteritis: Secondary | ICD-10-CM | POA: Diagnosis present

## 2022-09-07 DIAGNOSIS — I2510 Atherosclerotic heart disease of native coronary artery without angina pectoris: Secondary | ICD-10-CM | POA: Diagnosis present

## 2022-09-07 DIAGNOSIS — Z87891 Personal history of nicotine dependence: Secondary | ICD-10-CM | POA: Diagnosis not present

## 2022-09-07 DIAGNOSIS — Z7982 Long term (current) use of aspirin: Secondary | ICD-10-CM | POA: Diagnosis not present

## 2022-09-07 DIAGNOSIS — E876 Hypokalemia: Secondary | ICD-10-CM | POA: Diagnosis present

## 2022-09-07 DIAGNOSIS — R6510 Systemic inflammatory response syndrome (SIRS) of non-infectious origin without acute organ dysfunction: Secondary | ICD-10-CM | POA: Diagnosis not present

## 2022-09-07 DIAGNOSIS — E860 Dehydration: Secondary | ICD-10-CM | POA: Diagnosis present

## 2022-09-07 DIAGNOSIS — Z79899 Other long term (current) drug therapy: Secondary | ICD-10-CM | POA: Diagnosis not present

## 2022-09-07 DIAGNOSIS — E785 Hyperlipidemia, unspecified: Secondary | ICD-10-CM | POA: Diagnosis present

## 2022-09-07 LAB — CBC
HCT: 31.5 % — ABNORMAL LOW (ref 36.0–46.0)
Hemoglobin: 9.8 g/dL — ABNORMAL LOW (ref 12.0–15.0)
MCH: 32.2 pg (ref 26.0–34.0)
MCHC: 31.1 g/dL (ref 30.0–36.0)
MCV: 103.6 fL — ABNORMAL HIGH (ref 80.0–100.0)
Platelets: 202 10*3/uL (ref 150–400)
RBC: 3.04 MIL/uL — ABNORMAL LOW (ref 3.87–5.11)
RDW: 15.4 % (ref 11.5–15.5)
WBC: 14.1 10*3/uL — ABNORMAL HIGH (ref 4.0–10.5)
nRBC: 0 % (ref 0.0–0.2)

## 2022-09-07 LAB — BASIC METABOLIC PANEL
Anion gap: 12 (ref 5–15)
BUN: 28 mg/dL — ABNORMAL HIGH (ref 8–23)
CO2: 21 mmol/L — ABNORMAL LOW (ref 22–32)
Calcium: 8.4 mg/dL — ABNORMAL LOW (ref 8.9–10.3)
Chloride: 106 mmol/L (ref 98–111)
Creatinine, Ser: 1.87 mg/dL — ABNORMAL HIGH (ref 0.44–1.00)
GFR, Estimated: 28 mL/min — ABNORMAL LOW (ref >60)
Glucose, Bld: 97 mg/dL (ref 70–99)
Potassium: 3.7 mmol/L (ref 3.5–5.1)
Sodium: 139 mmol/L (ref 135–145)

## 2022-09-07 LAB — GASTROINTESTINAL PANEL BY PCR, STOOL (REPLACES STOOL CULTURE)

## 2022-09-07 LAB — C DIFFICILE QUICK SCREEN W PCR REFLEX
C Diff antigen: NEGATIVE
C Diff interpretation: NOT DETECTED
C Diff toxin: NEGATIVE

## 2022-09-07 LAB — MAGNESIUM: Magnesium: 1.3 mg/dL — ABNORMAL LOW (ref 1.7–2.4)

## 2022-09-07 LAB — CULTURE, BLOOD (ROUTINE X 2): Culture: NO GROWTH

## 2022-09-07 LAB — LACTIC ACID, PLASMA: Lactic Acid, Venous: 2.2 mmol/L (ref 0.5–1.9)

## 2022-09-07 MED ORDER — ACETAMINOPHEN 650 MG RE SUPP: 650.0000 mg | RECTAL | Status: DC | PRN

## 2022-09-07 MED ORDER — ACETAMINOPHEN 325 MG PO TABS: 650.0000 mg | ORAL_TABLET | ORAL | Status: DC | PRN

## 2022-09-07 MED ORDER — TRAMADOL HCL 50 MG PO TABS: 100.0000 mg | ORAL_TABLET | Freq: Three times a day (TID) | ORAL | Status: DC

## 2022-09-07 MED ORDER — HEPARIN SODIUM (PORCINE) 5000 UNIT/ML IJ SOLN
5000.0000 [IU] | Freq: Three times a day (TID) | INTRAMUSCULAR | Status: DC
  Administered 2022-09-07 (×3): 5000 [IU] via SUBCUTANEOUS
  Filled 2022-09-07 (×5): qty 1

## 2022-09-07 MED ORDER — ORAL CARE MOUTH RINSE: 15.0000 mL | OROMUCOSAL | Status: DC | PRN

## 2022-09-07 MED ORDER — TRAMADOL HCL 50 MG PO TABS
100.0000 mg | ORAL_TABLET | Freq: Two times a day (BID) | ORAL | Status: DC
  Administered 2022-09-07 – 2022-09-08 (×4): 100 mg via ORAL
  Filled 2022-09-07 (×4): qty 2

## 2022-09-07 MED ORDER — ATORVASTATIN CALCIUM 20 MG PO TABS
20.0000 mg | ORAL_TABLET | Freq: Every day | ORAL | Status: DC
  Administered 2022-09-07 – 2022-09-08 (×2): 20 mg via ORAL
  Filled 2022-09-07 (×2): qty 1

## 2022-09-07 MED ORDER — PREGABALIN 75 MG PO CAPS
150.0000 mg | ORAL_CAPSULE | Freq: Two times a day (BID) | ORAL | Status: DC
  Administered 2022-09-07 – 2022-09-09 (×6): 150 mg via ORAL
  Filled 2022-09-07 (×6): qty 2

## 2022-09-07 MED ORDER — OXYCODONE HCL 5 MG PO TABS
5.0000 mg | ORAL_TABLET | ORAL | Status: DC | PRN
  Administered 2022-09-07: 5 mg via ORAL
  Filled 2022-09-07: qty 1

## 2022-09-07 MED ORDER — MAGNESIUM SULFATE 2 GM/50ML IV SOLN
2.0000 g | Freq: Once | INTRAVENOUS | Status: AC
  Administered 2022-09-07: 2 g via INTRAVENOUS
  Filled 2022-09-07: qty 50

## 2022-09-07 NOTE — Hospital Course (Signed)
Brandi Holmes is a 72 yo female with PMH metastatic non-small cell lung cancer s/p RUL lobectomy (on chemo and immunotherapy), CAD, HTN, HLD, anemia who presented to the ED with complaints of vomiting and diarrhea.   Last treatment was about 2 weeks ago and this past week she began developing abdominal discomfort with associated green watery diarrhea. WBC elevated on admission, 15.1.  She was afebrile and noted to have mild tachycardia.  She was admitted for stool testing and further workup.

## 2022-09-07 NOTE — Progress Notes (Signed)
Progress Note    Kalyah Sagona McAdoo-Bey   ZOX:[id number]  DOB: [birth year]  DOA: 09/06/2022     0 PCP: [name], MD  Initial CC: N/V/D, abdominal pain  Hospital Course: Ms. Bardo is a 72 yo female with PMH metastatic non-small cell lung cancer s/p RUL lobectomy (on chemo and immunotherapy), CAD, HTN, HLD, anemia who presented to the ED with complaints of vomiting and diarrhea.   Last treatment was about 2 weeks ago and this past week she began developing abdominal discomfort with associated green watery diarrhea. WBC elevated on admission, 15.1.  She was afebrile and noted to have mild tachycardia.  She was admitted for stool testing and further workup.  Interval History:  Abdominal discomfort had improved when seen today.  Also appetite has improved and has had no further vomiting.  Assessment and Plan:  Nausea, vomiting, diarrhea SIRS Patient with history of lung cancer on chemotherapy and immunotherapy presenting with 2 day history of nausea and vomiting, diarrhea.  She describes the diarrhea as watery and bright green in color.   -No acute findings on CT abdomen/pelvis to explain abdominal discomfort or diarrhea - Denies recent antibiotic use or known sick contacts - continue IVF - continue diet - check Cdiff (negative) and GI pathogen panel    AKI Likely prerenal from dehydration - baseline around 1-1.2 - creat 1.97 on admission - continue IVF  - BMP in am   Hypokalemia - replete as needed   Metastatic non-small cell lung cancer Status post right upper lobectomy currently on chemotherapy and immunotherapy.  Outpatient oncology follow-up.   Hypertension -BP normal and was low on admission.  Holding antihypertensives for now   CAD Nuclear medicine stress test done in June 2022 was low risk.  Patient is not endorsing any chest pain.  EKG ordered.   Old records reviewed in assessment of this patient  Antimicrobials:   DVT prophylaxis:  heparin  injection 5,000 Units Start: 09/07/22 0600   Code Status:   Code Status: Full Code  Mobility Assessment (last 72 hours)     Mobility Assessment     Row Name 09/07/22 0006           Does patient have an order for bedrest or is patient medically unstable No - Continue assessment       What is the highest level of mobility based on the progressive mobility assessment? Level 5 (Walks with assist in room/hall) - Balance while stepping forward/back and can walk in room with assist - Complete                Barriers to discharge: none Disposition Plan:  Home Status is: Obs  Objective: Blood pressure 114/80, pulse 83, temperature 98.4 F (36.9 C), temperature source Oral, resp. rate 18, height 5\' 6"  (1.676 m), weight 72.7 kg, SpO2 100 %.  Examination:  Physical Exam Constitutional:      Appearance: Normal appearance.  HENT:     Head: Normocephalic and atraumatic.     Mouth/Throat:     Mouth: Mucous membranes are moist.  Eyes:     Extraocular Movements: Extraocular movements intact.  Cardiovascular:     Rate and Rhythm: Normal rate and regular rhythm.  Pulmonary:     Effort: Pulmonary effort is normal.     Breath sounds: Normal breath sounds.  Abdominal:     General: Bowel sounds are normal. There is no distension.     Palpations: Abdomen is soft.     Tenderness: There  is abdominal tenderness.  Musculoskeletal:        General: Normal range of motion.     Cervical back: Normal range of motion and neck supple.  Skin:    General: Skin is warm and dry.  Neurological:     General: No focal deficit present.     Mental Status: She is alert.  Psychiatric:        Mood and Affect: Mood normal.      Consultants:    Procedures:    Data Reviewed: Results for orders placed or performed during the hospital encounter of 09/06/22 (from the past 24 hour(s))  CBC with Differential/Platelet     Status: Abnormal   Collection Time: 09/06/22  9:26 PM  Result Value Ref Range    WBC 15.1 (H) 4.0 - 10.5 K/uL   RBC 3.11 (L) 3.87 - 5.11 MIL/uL   Hemoglobin 10.2 (L) 12.0 - 15.0 g/dL   HCT 54.0 (L) 98.1 - 19.1 %   MCV 105.8 (H) 80.0 - 100.0 fL   MCH 32.8 26.0 - 34.0 pg   MCHC 31.0 30.0 - 36.0 g/dL   RDW 47.8 29.5 - 62.1 %   Platelets 208 150 - 400 K/uL   nRBC 0.0 0.0 - 0.2 %   Neutrophils Relative % 75 %   Neutro Abs 11.3 (H) 1.7 - 7.7 K/uL   Lymphocytes Relative 5 %   Lymphs Abs 0.8 0.7 - 4.0 K/uL   Monocytes Relative 14 %   Monocytes Absolute 2.1 (H) 0.1 - 1.0 K/uL   Eosinophils Relative 5 %   Eosinophils Absolute 0.8 (H) 0.0 - 0.5 K/uL   Basophils Relative 0 %   Basophils Absolute 0.0 0.0 - 0.1 K/uL   Immature Granulocytes 1 %   Abs Immature Granulocytes 0.07 0.00 - 0.07 K/uL  Comprehensive metabolic panel     Status: Abnormal   Collection Time: 09/06/22  9:26 PM  Result Value Ref Range   Sodium 138 135 - 145 mmol/L   Potassium 3.3 (L) 3.5 - 5.1 mmol/L   Chloride 105 98 - 111 mmol/L   CO2 22 22 - 32 mmol/L   Glucose, Bld 87 70 - 99 mg/dL   BUN 28 (H) 8 - 23 mg/dL   Creatinine, Ser 3.08 (H) 0.44 - 1.00 mg/dL   Calcium 8.3 (L) 8.9 - 10.3 mg/dL   Total Protein 6.5 6.5 - 8.1 g/dL   Albumin 3.0 (L) 3.5 - 5.0 g/dL   AST 30 15 - 41 U/L   ALT 15 0 - 44 U/L   Alkaline Phosphatase 66 38 - 126 U/L   Total Bilirubin 0.9 0.3 - 1.2 mg/dL   GFR, Estimated 26 (L) >60 mL/min   Anion gap 11 5 - 15  Lipase, blood     Status: None   Collection Time: 09/06/22  9:26 PM  Result Value Ref Range   Lipase 21 11 - 51 U/L  Lactic acid, plasma     Status: Abnormal   Collection Time: 09/06/22  9:26 PM  Result Value Ref Range   Lactic Acid, Venous 2.1 (HH) 0.5 - 1.9 mmol/L  Lactic acid, plasma     Status: Abnormal   Collection Time: 09/07/22 12:29 AM  Result Value Ref Range   Lactic Acid, Venous 2.2 (HH) 0.5 - 1.9 mmol/L  CBC     Status: Abnormal   Collection Time: 09/07/22 12:29 AM  Result Value Ref Range   WBC 14.1 (H) 4.0 - 10.5 K/uL  RBC 3.04 (L) 3.87 - 5.11  MIL/uL   Hemoglobin 9.8 (L) 12.0 - 15.0 g/dL   HCT 16.1 (L) 09.6 - 04.5 %   MCV 103.6 (H) 80.0 - 100.0 fL   MCH 32.2 26.0 - 34.0 pg   MCHC 31.1 30.0 - 36.0 g/dL   RDW 40.9 81.1 - 91.4 %   Platelets 202 150 - 400 K/uL   nRBC 0.0 0.0 - 0.2 %  Basic metabolic panel     Status: Abnormal   Collection Time: 09/07/22 12:29 AM  Result Value Ref Range   Sodium 139 135 - 145 mmol/L   Potassium 3.7 3.5 - 5.1 mmol/L   Chloride 106 98 - 111 mmol/L   CO2 21 (L) 22 - 32 mmol/L   Glucose, Bld 97 70 - 99 mg/dL   BUN 28 (H) 8 - 23 mg/dL   Creatinine, Ser 7.82 (H) 0.44 - 1.00 mg/dL   Calcium 8.4 (L) 8.9 - 10.3 mg/dL   GFR, Estimated 28 (L) >60 mL/min   Anion gap 12 5 - 15  Magnesium     Status: Abnormal   Collection Time: 09/07/22 12:29 AM  Result Value Ref Range   Magnesium 1.3 (L) 1.7 - 2.4 mg/dL  Culture, blood (Routine X 2) w Reflex to ID Panel     Status: None (Preliminary result)   Collection Time: 09/07/22 12:29 AM   Specimen: BLOOD LEFT HAND  Result Value Ref Range   Specimen Description      BLOOD LEFT HAND Performed at [facility], [address], [place], [place] [address]    Special Requests      BOTTLES DRAWN AEROBIC ONLY Blood Culture adequate volume Performed at [facility], [address] [place], [place] [address]    Culture      NO GROWTH < 12 HOURS Performed at [facility], [address] [place], [place] [address]    Report Status PENDING   Culture, blood (Routine X 2) w Reflex to ID Panel     Status: None (Preliminary result)   Collection Time: 09/07/22 12:29 AM   Specimen: BLOOD LEFT HAND  Result Value Ref Range   Specimen Description      BLOOD LEFT HAND Performed at [facility], [address], [place], [place] [address]    Special Requests      BOTTLES DRAWN AEROBIC ONLY Blood Culture results may not be optimal due to an inadequate volume of blood received in culture bottles Performed at [facility], [address], [place], [place] [address]    Culture      NO GROWTH < 12 HOURS Performed at [facility], [address] [place], [place] [address]    Report Status PENDING   C Difficile Quick Screen w PCR reflex     Status: None   Collection Time: 09/07/22  1:26 AM   Specimen: STOOL  Result Value Ref Range   C Diff antigen NEGATIVE NEGATIVE   C Diff toxin NEGATIVE NEGATIVE   C Diff interpretation No C. difficile detected.     I have reviewed pertinent nursing notes, vitals, labs, and images as necessary. I have ordered labwork to follow up on as indicated.  I have reviewed the last notes from staff over past 24 hours. I have discussed patient's care plan and test results with nursing staff, CM/SW, and other staff as appropriate.  Time spent: Greater than 50% of the  55 minute visit was spent in counseling/coordination of care for the patient as laid out in the A&P.   LOS: 0 days   Lewie Chamber, MD [facility] 09/07/2022, 1:06 PM

## 2022-09-08 DIAGNOSIS — A080 Rotaviral enteritis: Secondary | ICD-10-CM | POA: Diagnosis not present

## 2022-09-08 DIAGNOSIS — R6510 Systemic inflammatory response syndrome (SIRS) of non-infectious origin without acute organ dysfunction: Secondary | ICD-10-CM | POA: Diagnosis not present

## 2022-09-08 LAB — BASIC METABOLIC PANEL
Anion gap: 7 (ref 5–15)
BUN: 16 mg/dL (ref 8–23)
CO2: 25 mmol/L (ref 22–32)
Calcium: 8.5 mg/dL — ABNORMAL LOW (ref 8.9–10.3)
Chloride: 108 mmol/L (ref 98–111)
Creatinine, Ser: 1.36 mg/dL — ABNORMAL HIGH (ref 0.44–1.00)
GFR, Estimated: 41 mL/min — ABNORMAL LOW (ref >60)
Glucose, Bld: 73 mg/dL (ref 70–99)
Potassium: 3.6 mmol/L (ref 3.5–5.1)
Sodium: 140 mmol/L (ref 135–145)

## 2022-09-08 LAB — CBC WITH DIFFERENTIAL/PLATELET
Abs Immature Granulocytes: 0.02 10*3/uL (ref 0.00–0.07)
Basophils Absolute: 0 10*3/uL (ref 0.0–0.1)
Basophils Relative: 0 %
Eosinophils Absolute: 1.4 10*3/uL — ABNORMAL HIGH (ref 0.0–0.5)
Eosinophils Relative: 15 %
HCT: 26.5 % — ABNORMAL LOW (ref 36.0–46.0)
Hemoglobin: 8.2 g/dL — ABNORMAL LOW (ref 12.0–15.0)
Immature Granulocytes: 0 %
Lymphocytes Relative: 27 %
Lymphs Abs: 2.5 10*3/uL (ref 0.7–4.0)
MCH: 32.2 pg (ref 26.0–34.0)
MCHC: 30.9 g/dL (ref 30.0–36.0)
MCV: 103.9 fL — ABNORMAL HIGH (ref 80.0–100.0)
Monocytes Absolute: 1.6 10*3/uL — ABNORMAL HIGH (ref 0.1–1.0)
Monocytes Relative: 17 %
Neutro Abs: 3.8 10*3/uL (ref 1.7–7.7)
Neutrophils Relative %: 41 %
Platelets: 183 10*3/uL (ref 150–400)
RBC: 2.55 MIL/uL — ABNORMAL LOW (ref 3.87–5.11)
RDW: 15.1 % (ref 11.5–15.5)
WBC: 9.3 10*3/uL (ref 4.0–10.5)
nRBC: 0 % (ref 0.0–0.2)

## 2022-09-08 LAB — MAGNESIUM: Magnesium: 1.7 mg/dL (ref 1.7–2.4)

## 2022-09-08 LAB — CULTURE, BLOOD (ROUTINE X 2): Culture: NO GROWTH

## 2022-09-08 MED ORDER — MAGNESIUM SULFATE 2 GM/50ML IV SOLN
2.0000 g | Freq: Once | INTRAVENOUS | Status: AC
  Administered 2022-09-08: 2 g via INTRAVENOUS
  Filled 2022-09-08: qty 50

## 2022-09-08 MED ORDER — TRAMADOL HCL 50 MG PO TABS
100.0000 mg | ORAL_TABLET | Freq: Three times a day (TID) | ORAL | Status: DC | PRN
  Administered 2022-09-08 – 2022-09-09 (×3): 100 mg via ORAL
  Filled 2022-09-08 (×3): qty 2

## 2022-09-08 NOTE — Assessment & Plan Note (Signed)
-   due to rotavirus infection; sepsis ruled out

## 2022-09-08 NOTE — Assessment & Plan Note (Signed)
Likely prerenal from dehydration - baseline around 1-1.2 - creat 1.97 on admission - s/p IVF - creat improved to 1.2 at discharge

## 2022-09-08 NOTE — Assessment & Plan Note (Signed)
-   Patient presented with nausea, vomiting, diarrhea, and abdominal discomfort.  GI pathogen panel positive for rotavirus - C. difficile negative - Diarrhea slowly improving and oral intake also improving

## 2022-09-08 NOTE — Progress Notes (Signed)
Progress Note    Brandi Holmes   ZOX:[id number]  DOB: 1951/02/03  DOA: 09/06/2022     1 PCP: [name], MD  Initial CC: N/V/D, abdominal pain  Hospital Course: Brandi Holmes is a 72 yo female with PMH metastatic non-small cell lung cancer s/p RUL lobectomy (on chemo and immunotherapy), CAD, HTN, HLD, anemia who presented to the ED with complaints of vomiting and diarrhea.   Last treatment was about 2 weeks ago and this past week she began developing abdominal discomfort with associated green watery diarrhea. WBC elevated on admission, 15.1.  She was afebrile and noted to have mild tachycardia.  She was admitted for stool testing and further workup.  Interval History:  Reviewed findings of GI pathogen panel with patient.  She endorsed improvement in her diarrhea and oral intake but did not feel comfortable going home today.   Assessment and Plan: * Rotavirus infection - Patient presented with nausea, vomiting, diarrhea, and abdominal discomfort.  GI pathogen panel positive for rotavirus - C. difficile negative - Continue supportive measures and enteric precautions - Diarrhea slowly improving and oral intake also improving  SIRS (systemic inflammatory response syndrome) (HCC)-resolved as of 09/08/2022 - due to rotavirus infection; sepsis ruled out  AKI (acute kidney injury) (HCC) Likely prerenal from dehydration - baseline around 1-1.2 - creat 1.97 on admission - continue IVF  - BMP in am   Metastatic non-small cell lung cancer Status post right upper lobectomy currently on chemotherapy and immunotherapy.  Outpatient oncology follow-up.   Hypertension -BP normal and was low on admission.  Holding antihypertensives for now   CAD Nuclear medicine stress test done in June 2022 was low risk.  Patient is not endorsing any chest pain.  EKG ordered.   Old records reviewed in assessment of this patient  Antimicrobials:   DVT prophylaxis:  heparin injection  5,000 Units Start: 09/07/22 0600   Code Status:   Code Status: Full Code  Mobility Assessment (last 72 hours)     Mobility Assessment     Row Name 09/07/22 2000 09/07/22 0845 09/07/22 0006       Does patient have an order for bedrest or is patient medically unstable No - Continue assessment No - Continue assessment No - Continue assessment     What is the highest level of mobility based on the progressive mobility assessment? Level 4 (Walks with assist in room) - Balance while marching in place and cannot step forward and back - Complete Level 4 (Walks with assist in room) - Balance while marching in place and cannot step forward and back - Complete Level 5 (Walks with assist in room/hall) - Balance while stepping forward/back and can walk in room with assist - Complete              Barriers to discharge: none Disposition Plan:  Home Status is: Inpt  Objective: Blood pressure 125/64, pulse 78, temperature 98.6 F (37 C), temperature source Oral, resp. rate 18, height 5\' 6"  (1.676 m), weight 72.7 kg, SpO2 94 %.  Examination:  Physical Exam Constitutional:      Appearance: Normal appearance.  HENT:     Head: Normocephalic and atraumatic.     Mouth/Throat:     Mouth: Mucous membranes are moist.  Eyes:     Extraocular Movements: Extraocular movements intact.  Cardiovascular:     Rate and Rhythm: Normal rate and regular rhythm.  Pulmonary:     Effort: Pulmonary effort is normal.  Breath sounds: Normal breath sounds.  Abdominal:     General: Bowel sounds are normal. There is no distension.     Palpations: Abdomen is soft.     Tenderness: There is abdominal tenderness.  Musculoskeletal:        General: Normal range of motion.     Cervical back: Normal range of motion and neck supple.  Skin:    General: Skin is warm and dry.  Neurological:     General: No focal deficit present.     Mental Status: She is alert.  Psychiatric:        Mood and Affect: Mood normal.       Consultants:    Procedures:    Data Reviewed: Results for orders placed or performed during the hospital encounter of 09/06/22 (from the past 24 hour(s))  Basic metabolic panel     Status: Abnormal   Collection Time: 09/08/22  5:23 AM  Result Value Ref Range   Sodium 140 135 - 145 mmol/L   Potassium 3.6 3.5 - 5.1 mmol/L   Chloride 108 98 - 111 mmol/L   CO2 25 22 - 32 mmol/L   Glucose, Bld 73 70 - 99 mg/dL   BUN 16 8 - 23 mg/dL   Creatinine, Ser 8.11 (H) 0.44 - 1.00 mg/dL   Calcium 8.5 (L) 8.9 - 10.3 mg/dL   GFR, Estimated 41 (L) >60 mL/min   Anion gap 7 5 - 15  CBC with Differential/Platelet     Status: Abnormal   Collection Time: 09/08/22  5:23 AM  Result Value Ref Range   WBC 9.3 4.0 - 10.5 K/uL   RBC 2.55 (L) 3.87 - 5.11 MIL/uL   Hemoglobin 8.2 (L) 12.0 - 15.0 g/dL   HCT 91.4 (L) 78.2 - 95.6 %   MCV 103.9 (H) 80.0 - 100.0 fL   MCH 32.2 26.0 - 34.0 pg   MCHC 30.9 30.0 - 36.0 g/dL   RDW 21.3 08.6 - 57.8 %   Platelets 183 150 - 400 K/uL   nRBC 0.0 0.0 - 0.2 %   Neutrophils Relative % 41 %   Neutro Abs 3.8 1.7 - 7.7 K/uL   Lymphocytes Relative 27 %   Lymphs Abs 2.5 0.7 - 4.0 K/uL   Monocytes Relative 17 %   Monocytes Absolute 1.6 (H) 0.1 - 1.0 K/uL   Eosinophils Relative 15 %   Eosinophils Absolute 1.4 (H) 0.0 - 0.5 K/uL   Basophils Relative 0 %   Basophils Absolute 0.0 0.0 - 0.1 K/uL   Immature Granulocytes 0 %   Abs Immature Granulocytes 0.02 0.00 - 0.07 K/uL  Magnesium     Status: None   Collection Time: 09/08/22  5:23 AM  Result Value Ref Range   Magnesium 1.7 1.7 - 2.4 mg/dL    I have reviewed pertinent nursing notes, vitals, labs, and images as necessary. I have ordered labwork to follow up on as indicated.  I have reviewed the last notes from staff over past 24 hours. I have discussed patient's care plan and test results with nursing staff, CM/SW, and other staff as appropriate.  Time spent: Greater than 50% of the 55 minute visit was spent in  counseling/coordination of care for the patient as laid out in the A&P.   LOS: 1 day   Lewie Chamber, MD [facility] 09/08/2022, 12:25 PM

## 2022-09-08 NOTE — Progress Notes (Signed)
  Transition of Care [facility]) Screening Note   Patient Details  Name: Brandi Holmes Date of Birth: 05-Sep-1950   [facility] CM/SW Contact:    Adrian Prows, RN Phone Number: 09/08/2022, 3:09 PM    Transition of Care Department [facility]) has reviewed patient and no TOC needs have been identified at this time. We will continue to monitor patient advancement through interdisciplinary progression rounds. If new patient transition needs arise, please place a TOC consult.

## 2022-09-09 DIAGNOSIS — C3411 Malignant neoplasm of upper lobe, right bronchus or lung: Secondary | ICD-10-CM | POA: Diagnosis not present

## 2022-09-09 DIAGNOSIS — N179 Acute kidney failure, unspecified: Secondary | ICD-10-CM

## 2022-09-09 DIAGNOSIS — A080 Rotaviral enteritis: Secondary | ICD-10-CM | POA: Diagnosis not present

## 2022-09-09 DIAGNOSIS — R6510 Systemic inflammatory response syndrome (SIRS) of non-infectious origin without acute organ dysfunction: Secondary | ICD-10-CM | POA: Diagnosis not present

## 2022-09-09 LAB — CBC WITH DIFFERENTIAL/PLATELET
Abs Immature Granulocytes: 0.06 10*3/uL (ref 0.00–0.07)
Basophils Absolute: 0.1 10*3/uL (ref 0.0–0.1)
Basophils Relative: 1 %
Eosinophils Absolute: 1.8 10*3/uL — ABNORMAL HIGH (ref 0.0–0.5)
Eosinophils Relative: 15 %
HCT: 31.4 % — ABNORMAL LOW (ref 36.0–46.0)
Hemoglobin: 9.7 g/dL — ABNORMAL LOW (ref 12.0–15.0)
Immature Granulocytes: 1 %
Lymphocytes Relative: 26 %
Lymphs Abs: 3.3 10*3/uL (ref 0.7–4.0)
MCH: 32.6 pg (ref 26.0–34.0)
MCHC: 30.9 g/dL (ref 30.0–36.0)
MCV: 105.4 fL — ABNORMAL HIGH (ref 80.0–100.0)
Monocytes Absolute: 1.6 10*3/uL — ABNORMAL HIGH (ref 0.1–1.0)
Monocytes Relative: 13 %
Neutro Abs: 5.8 10*3/uL (ref 1.7–7.7)
Neutrophils Relative %: 44 %
Platelets: 231 10*3/uL (ref 150–400)
RBC: 2.98 MIL/uL — ABNORMAL LOW (ref 3.87–5.11)
RDW: 14.9 % (ref 11.5–15.5)
WBC: 12.6 10*3/uL — ABNORMAL HIGH (ref 4.0–10.5)
nRBC: 0 % (ref 0.0–0.2)

## 2022-09-09 LAB — BASIC METABOLIC PANEL
Anion gap: 8 (ref 5–15)
BUN: 12 mg/dL (ref 8–23)
CO2: 26 mmol/L (ref 22–32)
Calcium: 9 mg/dL (ref 8.9–10.3)
Chloride: 107 mmol/L (ref 98–111)
Creatinine, Ser: 1.21 mg/dL — ABNORMAL HIGH (ref 0.44–1.00)
GFR, Estimated: 47 mL/min — ABNORMAL LOW (ref >60)
Glucose, Bld: 75 mg/dL (ref 70–99)
Potassium: 3.7 mmol/L (ref 3.5–5.1)
Sodium: 141 mmol/L (ref 135–145)

## 2022-09-09 LAB — CULTURE, BLOOD (ROUTINE X 2)

## 2022-09-09 LAB — MAGNESIUM: Magnesium: 1.9 mg/dL (ref 1.7–2.4)

## 2022-09-09 NOTE — TOC Transition Note (Signed)
Transition of Care [facility]) - CM/SW Discharge Note   Patient Details  Name: Brandi Holmes MRN: [id number] Date of Birth: 1950/11/11  Transition of Care [facility]) CM/SW Contact:  Howell Rucks, RN Phone Number: 09/09/2022, 11:53 AM   Clinical Narrative:  Met with pt a bedside, introduced role of TOC/NCM and to review for dc needs. Taxi voucher per request MD, pt reports no DME at home, voiced she can get herself in to her home, address veritifed. Taxi voucher provided. No further TOC needs identified.      Final next level of care: Home/Self Care     Patient Goals and CMS Choice CMS Medicare.gov Compare Post Acute Care list provided to:: Patient Choice offered to / list presented to : Patient  Discharge Placement                         Discharge Plan and Services Additional resources added to the After Visit Summary for                                       Social Determinants of Health (SDOH) Interventions SDOH Screenings   Food Insecurity: No Food Insecurity (09/07/2022)  Housing: Low Risk  (09/07/2022)  Transportation Needs: No Transportation Needs (09/07/2022)  Utilities: Not At Risk (09/07/2022)  Alcohol Screen: Low Risk  (06/19/2022)  Social Connections: Moderately Integrated (06/19/2022)  Tobacco Use: Medium Risk (09/06/2022)     Readmission Risk Interventions     No data to display

## 2022-09-09 NOTE — Discharge Summary (Signed)
Physician Discharge Summary   Brandi Holmes NGE:[id number] DOB: 08/30/1950 DOA: 09/06/2022  PCP: Brandi Able, MD  Admit date: 09/06/2022 Discharge date: 09/09/2022  Admitted From: Home Disposition:  Home Discharging physician: Lewie Chamber, MD Barriers to discharge: none  Recommendations at discharge: Follow up with oncology    Discharge Condition: stable CODE STATUS: Full Diet recommendation:  Diet Orders (From admission, onward)     Start     Ordered   09/09/22 0000  Diet general        09/09/22 1143   09/07/22 1310  Diet regular Fluid consistency: Thin  Diet effective now       Question:  Fluid consistency:  Answer:  Thin   09/07/22 1309            Hospital Course: [name] is a 72 yo female with PMH metastatic non-small cell lung cancer s/p RUL lobectomy (on chemo and immunotherapy), CAD, HTN, HLD, anemia who presented to the ED with complaints of vomiting and diarrhea.   Last treatment was about 2 weeks ago and this past week she began developing abdominal discomfort with associated green watery diarrhea. WBC elevated on admission, 15.1.  She was afebrile and noted to have mild tachycardia.  She was admitted for stool testing and further workup.  Assessment and Plan: * Rotavirus infection - Patient presented with nausea, vomiting, diarrhea, and abdominal discomfort.  GI pathogen panel positive for rotavirus - C. difficile negative - Diarrhea slowly improving and oral intake also improving  SIRS (systemic inflammatory response syndrome) (HCC)-resolved as of 09/08/2022 - due to rotavirus infection; sepsis ruled out  AKI (acute kidney injury) (HCC)-resolved as of 09/09/2022 Likely prerenal from dehydration - baseline around 1-1.2 - creat 1.97 on admission - s/p IVF - creat improved to 1.2 at discharge    Metastatic non-small cell lung cancer Status post right upper lobectomy currently on chemotherapy and immunotherapy.  Outpatient oncology  follow-up.   Hypertension -BP normal and was low on admission.  Holding antihypertensives for now   CAD Nuclear medicine stress test done in June 2022 was low risk.  Patient is not endorsing any chest pain.  EKG ordered.   The patient's chronic medical conditions were treated accordingly per the patient's home medication regimen except as noted.  On day of discharge, patient was felt deemed stable for discharge. Patient/family member advised to call PCP or come back to ER if needed.   Principal Diagnosis: Rotavirus infection  Discharge Diagnoses: Active Hospital Problems   Diagnosis Date Noted   Rotavirus infection 09/06/2022    Priority: 1.   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   HTN (hypertension) 09/11/2011    Resolved Hospital Problems   Diagnosis Date Noted Date Resolved   SIRS (systemic inflammatory response syndrome) (HCC) 09/06/2022 09/08/2022    Priority: 2.   AKI (acute kidney injury) (HCC) 09/06/2022 09/09/2022    Priority: 3.   Hypomagnesemia 09/09/2022 09/09/2022   Hypokalemia 09/06/2022 09/09/2022     Discharge Instructions     Diet general   Complete by: As directed    Increase activity slowly   Complete by: As directed       Allergies as of 09/09/2022       Reactions   Crab [shellfish Allergy] Swelling   Swelling of feet   Morphine And Codeine Other (See Comments)   Nightmares         Medication List     TAKE these  medications    aspirin EC 81 MG tablet Take 81 mg by mouth at bedtime. Swallow whole.   atorvastatin 20 MG tablet Commonly known as: LIPITOR Take 20 mg by mouth at bedtime. What changed: Another medication with the same name was removed. Continue taking this medication, and follow the directions you see here.   folic acid 1 MG tablet Commonly known as: FOLVITE Take 1 tablet (1 mg total) by mouth daily.   furosemide 20 MG tablet Commonly known as: LASIX Take 20 mg by mouth daily as  needed for fluid or edema.   lactose free nutrition Liqd Take 237 mLs by mouth 2 (two) times daily between meals.   lidocaine 5 % Commonly known as: LIDODERM Place 1 patch onto the skin daily. Remove & Discard patch within 12 hours or as directed by MD What changed:  when to take this reasons to take this   lisinopril 20 MG tablet Commonly known as: ZESTRIL Take 20 mg by mouth in the morning.   pregabalin 150 MG capsule Commonly known as: LYRICA Take 150 mg by mouth 2 (two) times daily.   prochlorperazine 10 MG tablet Commonly known as: COMPAZINE Take 1 tablet (10 mg total) by mouth every 6 (six) hours as needed for nausea or vomiting.   tiZANidine 2 MG tablet Commonly known as: ZANAFLEX Take 2 mg by mouth at bedtime.   traMADol HCl 100 MG Tabs Take 1 tablet by mouth in the morning, at noon, and at bedtime.        Allergies  Allergen Reactions   Crab [Shellfish Allergy] Swelling    Swelling of feet   Morphine And Codeine Other (See Comments)    Nightmares     Consultations:   Procedures:   Discharge Exam: BP (!) 152/76 (BP Location: Left Arm)   Pulse 81   Temp 98.6 F (37 C) (Oral)   Resp 16   Ht 5\' 6"  (1.676 m)   Wt 72.7 kg   SpO2 97%   BMI 25.87 kg/m  Physical Exam Constitutional:      Appearance: Normal appearance.  HENT:     Head: Normocephalic and atraumatic.     Mouth/Throat:     Mouth: Mucous membranes are moist.  Eyes:     Extraocular Movements: Extraocular movements intact.  Cardiovascular:     Rate and Rhythm: Normal rate and regular rhythm.  Pulmonary:     Effort: Pulmonary effort is normal.     Breath sounds: Normal breath sounds.  Abdominal:     General: Bowel sounds are normal. There is no distension.     Palpations: Abdomen is soft.     Tenderness: There is abdominal tenderness (improved and minimal).  Musculoskeletal:        General: Normal range of motion.     Cervical back: Normal range of motion and neck supple.   Skin:    General: Skin is warm and dry.  Neurological:     General: No focal deficit present.     Mental Status: She is alert.  Psychiatric:        Mood and Affect: Mood normal.      The results of significant diagnostics from this hospitalization (including imaging, microbiology, ancillary and laboratory) are listed below for reference.   Microbiology: Recent Results (from the past 240 hour(s))  Culture, blood (Routine X 2) w Reflex to ID Panel     Status: None (Preliminary result)   Collection Time: 09/07/22 12:29 AM   Specimen:  BLOOD LEFT HAND  Result Value Ref Range Status   Specimen Description   Final    BLOOD LEFT HAND Performed at [facility], [address], [place], [place] [address]    Special Requests   Final    BOTTLES DRAWN AEROBIC ONLY Blood Culture adequate volume Performed at [facility], [address] [place], [place] [address]    Culture   Final    NO GROWTH 2 DAYS Performed at [facility], [address] [place], [place] [address]    Report Status PENDING  Incomplete  Culture, blood (Routine X 2) w Reflex to ID Panel     Status: None (Preliminary result)   Collection Time: 09/07/22 12:29 AM   Specimen: BLOOD LEFT HAND  Result Value Ref Range Status   Specimen Description   Final    BLOOD LEFT HAND Performed at [facility], [address], [place], [place] [address]    Special Requests   Final    BOTTLES DRAWN AEROBIC ONLY Blood Culture results may not be optimal due to an inadequate volume of blood received in culture bottles Performed at [facility], [address] [place], [place] [address]    Culture   Final    NO GROWTH 2 DAYS Performed at [facility], [address] [place], [place] [address]    Report Status PENDING  Incomplete  C Difficile Quick Screen w PCR reflex     Status: None   Collection Time: 09/07/22  1:26 AM   Specimen: STOOL  Result Value Ref Range Status    C Diff antigen NEGATIVE NEGATIVE Final   C Diff toxin NEGATIVE NEGATIVE Final   C Diff interpretation No C. difficile detected.  Final    Comment: Performed at [facility], [address] [place], [place] [address]  Gastrointestinal Panel by PCR , Stool     Status: Abnormal   Collection Time: 09/07/22  1:26 AM   Specimen: STOOL  Result Value Ref Range Status   Campylobacter species NOT DETECTED NOT DETECTED Final   Plesimonas shigelloides NOT DETECTED NOT DETECTED Final   Salmonella species NOT DETECTED NOT DETECTED Final   Yersinia enterocolitica NOT DETECTED NOT DETECTED Final   Vibrio species NOT DETECTED NOT DETECTED Final   Vibrio cholerae NOT DETECTED NOT DETECTED Final   Enteroaggregative E coli (EAEC) NOT DETECTED NOT DETECTED Final   Enteropathogenic E coli (EPEC) NOT DETECTED NOT DETECTED Final   Enterotoxigenic E coli (ETEC) NOT DETECTED NOT DETECTED Final   Shiga like toxin producing E coli (STEC) NOT DETECTED NOT DETECTED Final   Shigella/Enteroinvasive E coli (EIEC) NOT DETECTED NOT DETECTED Final   Cryptosporidium NOT DETECTED NOT DETECTED Final   Cyclospora cayetanensis NOT DETECTED NOT DETECTED Final   Entamoeba histolytica NOT DETECTED NOT DETECTED Final   Giardia lamblia NOT DETECTED NOT DETECTED Final   Adenovirus F40/41 NOT DETECTED NOT DETECTED Final   Astrovirus NOT DETECTED NOT DETECTED Final   Norovirus GI/GII NOT DETECTED NOT DETECTED Final   Rotavirus A DETECTED (A) NOT DETECTED Final   Sapovirus (I, II, IV, and V) NOT DETECTED NOT DETECTED Final    Comment: Performed at [facility], [address] [place], [place] [address]     Labs: BNP (last 3 results) No results for input(s): "BNP" in the last 8760 hours. Basic Metabolic Panel: Recent Labs  Lab 09/06/22 2126 09/07/22 0029 09/08/22 0523 09/09/22 0434  NA 138 139 140 141  K 3.3* 3.7 3.6 3.7  CL 105 106 108 107  CO2 22 21* 25 26  GLUCOSE 87 97 73 75  BUN 28*  28* 16 12  CREATININE 1.97* 1.87* 1.36* 1.21*  CALCIUM 8.3* 8.4* 8.5* 9.0  MG  --  1.3* 1.7 1.9   Liver Function Tests: Recent Labs  Lab 09/06/22 2126  AST 30  ALT 15  ALKPHOS 66  BILITOT 0.9  PROT 6.5  ALBUMIN 3.0*   Recent Labs  Lab 09/06/22 2126  LIPASE 21   No results for input(s): "AMMONIA" in the last 168 hours. CBC: Recent Labs  Lab 09/06/22 2126 09/07/22 0029 09/08/22 0523 09/09/22 0434  WBC 15.1* 14.1* 9.3 12.6*  NEUTROABS 11.3*  --  3.8 5.8  HGB 10.2* 9.8* 8.2* 9.7*  HCT 32.9* 31.5* 26.5* 31.4*  MCV 105.8* 103.6* 103.9* 105.4*  PLT 208 202 183 231   Cardiac Enzymes: No results for input(s): "CKTOTAL", "CKMB", "CKMBINDEX", "TROPONINI" in the last 168 hours. BNP: Invalid input(s): "POCBNP" CBG: No results for input(s): "GLUCAP" in the last 168 hours. D-Dimer No results for input(s): "DDIMER" in the last 72 hours. Hgb A1c No results for input(s): "HGBA1C" in the last 72 hours. Lipid Profile No results for input(s): "CHOL", "HDL", "LDLCALC", "TRIG", "CHOLHDL", "LDLDIRECT" in the last 72 hours. Thyroid function studies No results for input(s): "TSH", "T4TOTAL", "T3FREE", "THYROIDAB" in the last 72 hours.  Invalid input(s): "FREET3" Anemia work up No results for input(s): "VITAMINB12", "FOLATE", "FERRITIN", "TIBC", "IRON", "RETICCTPCT" in the last 72 hours. Urinalysis    Component Value Date/Time   COLORURINE STRAW (A) 03/07/2022 1524   APPEARANCEUR CLEAR 03/07/2022 1524   LABSPEC 1.004 (L) 03/07/2022 1524   PHURINE 5.0 03/07/2022 1524   GLUCOSEU NEGATIVE 03/07/2022 1524   HGBUR SMALL (A) 03/07/2022 1524   BILIRUBINUR NEGATIVE 03/07/2022 1524   KETONESUR NEGATIVE 03/07/2022 1524   PROTEINUR NEGATIVE 03/07/2022 1524   UROBILINOGEN 0.2 06/14/2010 1748   NITRITE NEGATIVE 03/07/2022 1524   LEUKOCYTESUR TRACE (A) 03/07/2022 1524   Sepsis Labs Recent Labs  Lab 09/06/22 2126 09/07/22 0029 09/08/22 0523 09/09/22 0434  WBC 15.1* 14.1* 9.3 12.6*    Microbiology Recent Results (from the past 240 hour(s))  Culture, blood (Routine X 2) w Reflex to ID Panel     Status: None (Preliminary result)   Collection Time: 09/07/22 12:29 AM   Specimen: BLOOD LEFT HAND  Result Value Ref Range Status   Specimen Description   Final    BLOOD LEFT HAND Performed at [facility], [address], [place], [place] [address]    Special Requests   Final    BOTTLES DRAWN AEROBIC ONLY Blood Culture adequate volume Performed at [facility], [address] [place], [place] [address]    Culture   Final    NO GROWTH 2 DAYS Performed at [facility], [address], [place], [place] [address]    Report Status PENDING  Incomplete  Culture, blood (Routine X 2) w Reflex to ID Panel     Status: None (Preliminary result)   Collection Time: 09/07/22 12:29 AM   Specimen: BLOOD LEFT HAND  Result Value Ref Range Status   Specimen Description   Final    BLOOD LEFT HAND Performed at [facility], [address] [place], [place] [address]    Special Requests   Final    BOTTLES DRAWN AEROBIC ONLY Blood Culture results may not  be optimal due to an inadequate volume of blood received in culture bottles Performed at [facility], [address], [place], [place] [address]    Culture   Final    NO GROWTH 2 DAYS Performed at [facility], [address] [place], [place] [address]    Report Status PENDING  Incomplete  C Difficile Quick Screen w PCR reflex     Status: None   Collection Time: 09/07/22  1:26 AM   Specimen: STOOL  Result Value Ref Range Status   C Diff antigen NEGATIVE NEGATIVE Final   C Diff toxin NEGATIVE NEGATIVE Final   C Diff interpretation No C. difficile detected.  Final    Comment: Performed at [facility], [address], [place], [place] [address]  Gastrointestinal Panel by PCR , Stool     Status: Abnormal   Collection Time: 09/07/22  1:26 AM    Specimen: STOOL  Result Value Ref Range Status   Campylobacter species NOT DETECTED NOT DETECTED Final   Plesimonas shigelloides NOT DETECTED NOT DETECTED Final   Salmonella species NOT DETECTED NOT DETECTED Final   Yersinia enterocolitica NOT DETECTED NOT DETECTED Final   Vibrio species NOT DETECTED NOT DETECTED Final   Vibrio cholerae NOT DETECTED NOT DETECTED Final   Enteroaggregative E coli (EAEC) NOT DETECTED NOT DETECTED Final   Enteropathogenic E coli (EPEC) NOT DETECTED NOT DETECTED Final   Enterotoxigenic E coli (ETEC) NOT DETECTED NOT DETECTED Final   Shiga like toxin producing E coli (STEC) NOT DETECTED NOT DETECTED Final   Shigella/Enteroinvasive E coli (EIEC) NOT DETECTED NOT DETECTED Final   Cryptosporidium NOT DETECTED NOT DETECTED Final   Cyclospora cayetanensis NOT DETECTED NOT DETECTED Final   Entamoeba histolytica NOT DETECTED NOT DETECTED Final   Giardia lamblia NOT DETECTED NOT DETECTED Final   Adenovirus F40/41 NOT DETECTED NOT DETECTED Final   Astrovirus NOT DETECTED NOT DETECTED Final   Norovirus GI/GII NOT DETECTED NOT DETECTED Final   Rotavirus A DETECTED (A) NOT DETECTED Final   Sapovirus (I, II, IV, and V) NOT DETECTED NOT DETECTED Final    Comment: Performed at [facility], [address] [place], [place] [address]    Procedures/Studies: CT ABDOMEN PELVIS WO CONTRAST  Result Date: 09/07/2022 CLINICAL DATA:  Diarrhea Abdominal pain, acute, nonlocalized EXAM: CT ABDOMEN AND PELVIS WITHOUT CONTRAST TECHNIQUE: Multidetector CT imaging of the abdomen and pelvis was performed following the standard protocol without IV contrast. RADIATION DOSE REDUCTION: This exam was performed according to the departmental dose-optimization program which includes automated exposure control, adjustment of the mA and/or kV according to patient size and/or use of iterative reconstruction technique. COMPARISON:  08/17/2022 FINDINGS: Lower chest: Medial right lower lobe  airspace opacity with sharp demarcation is un changed since prior study and may reflect radiation fibrosis. Extensive interstitial thickening and opacities in the lung bases bilaterally, likely fibrosis, unchanged. Trace right pleural effusion, stable. Cardiomegaly, small pericardial effusion, stable. Hepatobiliary: Scattered hypodensities in the liver compatible cysts, stable. Gallbladder unremarkable. Pancreas: No focal abnormality or ductal dilatation. Spleen: No focal abnormality.  Normal size. Adrenals/Urinary Tract: Calcification in the lower pole of the right kidney again noted. The associated renal lesion/mass seen on prior contrast enhanced CT not well visualized on this noncontrast study. No stones or hydronephrosis. Adrenal glands unremarkable. No hydronephrosis. Urinary bladder decompressed, grossly unremarkable. Stomach/Bowel: Normal appendix. Stomach, large and small bowel grossly unremarkable. Vascular/Lymphatic: Aortoiliac atherosclerosis. No evidence of aneurysm or adenopathy. Reproductive: Prior  hysterectomy.  No adnexal masses. Other: No free fluid or free air. Musculoskeletal: Widespread osseous sclerotic metastases again noted, stable. IMPRESSION: Changes of prior proces in the lung bases with probable radiation fibrosis in the medial right lower lung. Widespread sclerotic osseous metastases, stable. Aortic atherosclerosis. Previously described right renal lesion not well visualized on this noncontrast study. Associated calcifications within the right kidney are again noted, stable. No acute findings. Electronically Signed   By: Charlett Nose M.D.   On: 09/07/2022 00:10   CT CHEST ABDOMEN PELVIS W CONTRAST  Result Date: 08/21/2022 CLINICAL DATA:  Non-small-cell lung cancer diagnosed 3 years ago with chemotherapy and radiation therapy. Right upper lobectomy. * Tracking Code: BO * EXAM: CT CHEST, ABDOMEN, AND PELVIS WITH CONTRAST TECHNIQUE: Multidetector CT imaging of the chest, abdomen and  pelvis was performed following the standard protocol during bolus administration of intravenous contrast. RADIATION DOSE REDUCTION: This exam was performed according to the departmental dose-optimization program which includes automated exposure control, adjustment of the mA and/or kV according to patient size and/or use of iterative reconstruction technique. CONTRAST:  80mL OMNIPAQUE IOHEXOL 300 MG/ML  SOLN COMPARISON:  06/17/2022 FINDINGS: CT CHEST FINDINGS Cardiovascular: Aortic atherosclerosis. Tortuous thoracic aorta. Mild cardiomegaly, with small pericardial effusion, new. Lad and right coronary artery calcification. No central pulmonary embolism, on this non-dedicated study. Mediastinum/Nodes: No supraclavicular adenopathy. No mediastinal or hilar adenopathy. Lungs/Pleura: Tiny bilateral pleural effusions, similar on the right and increased on the left. Right paramediastinal presumed radiation fibrosis. There is also right lower lobe volume loss with airspace disease and bronchiectasis medially which may be radiation induced, similar. Right upper lobectomy. Musculoskeletal: Multifocal osseous metastasis, including a dominant mass centered about the right side of the T9 vertebral body with extension into the adjacent posterior right ribs. This is slightly more sclerotic today, suggesting partial interval healing. Example 33/2. Compression deformities involving T1, T8-T11 are similar. Multiple remote right rib fractures. CT ABDOMEN PELVIS FINDINGS Hepatobiliary: Scattered hepatic cysts including up to 1.0 cm. No suspicious liver lesion or biliary abnormality. Pancreas: Normal, without mass or ductal dilatation. Spleen: Normal in size, without focal abnormality. Adrenals/Urinary Tract: Normal adrenal glands. Partially calcified inter/lower pole right renal hypoattenuating lesion measures 2.0 x 1.9 cm on 70/2 versus 2.2 x 2.1 cm on the prior. Normal left kidney. No hydronephrosis. Normal urinary bladder.  Stomach/Bowel: Normal stomach, without wall thickening. Normal colon, appendix, and terminal ileum. Normal small bowel. Vascular/Lymphatic: Aortic atherosclerosis. No abdominopelvic adenopathy. Reproductive: Hysterectomy.  No adnexal mass. Other: No significant free fluid. No free intraperitoneal air. No evidence of omental or peritoneal disease. Musculoskeletal: Similar multifocal sclerotic osseous metastasis. Dominant L5 lesion of 4.9 cm is unchanged. IMPRESSION: 1. Widespread osseous metastasis, primarily similar. Lower thoracic spine involvement with suggestion of increased sclerosis, possibly representing partial healing. 2. No progressive soft tissue metastasis identified. 3. Status post right upper lobectomy. 4. New small pericardial effusion. Tiny bilateral pleural effusions, similar on the right and increased on the left. 5. Similar right renal mass which remains suspicious for renal cell carcinoma. 6. Aortic atherosclerosis (ICD10-I70.0), coronary artery atherosclerosis and emphysema (ICD10-J43.9). Electronically Signed   By: Jeronimo Greaves M.D.   On: 08/21/2022 10:30     Time coordinating discharge: Over 30 minutes    Lewie Chamber, MD  [facility] 09/09/2022, 2:29 PM

## 2022-09-10 ENCOUNTER — Ambulatory Visit: Payer: Medicare HMO | Admitting: Internal Medicine

## 2022-09-10 LAB — CULTURE, BLOOD (ROUTINE X 2): Special Requests: ADEQUATE

## 2022-09-11 ENCOUNTER — Encounter: Payer: Self-pay | Admitting: Internal Medicine

## 2022-09-11 ENCOUNTER — Inpatient Hospital Stay: Payer: Medicare HMO

## 2022-09-11 ENCOUNTER — Inpatient Hospital Stay (HOSPITAL_BASED_OUTPATIENT_CLINIC_OR_DEPARTMENT_OTHER): Payer: Medicare HMO | Admitting: Internal Medicine

## 2022-09-11 ENCOUNTER — Encounter: Payer: Self-pay | Admitting: Medical Oncology

## 2022-09-11 VITALS — BP 141/75 | HR 88 | Temp 98.1°F | Resp 17 | Wt 159.6 lb

## 2022-09-11 DIAGNOSIS — C7951 Secondary malignant neoplasm of bone: Secondary | ICD-10-CM

## 2022-09-11 DIAGNOSIS — Z902 Acquired absence of lung [part of]: Secondary | ICD-10-CM | POA: Diagnosis not present

## 2022-09-11 DIAGNOSIS — C3411 Malignant neoplasm of upper lobe, right bronchus or lung: Secondary | ICD-10-CM | POA: Diagnosis not present

## 2022-09-11 DIAGNOSIS — Z5111 Encounter for antineoplastic chemotherapy: Secondary | ICD-10-CM | POA: Diagnosis present

## 2022-09-11 DIAGNOSIS — Z5112 Encounter for antineoplastic immunotherapy: Secondary | ICD-10-CM | POA: Diagnosis not present

## 2022-09-11 LAB — CBC WITH DIFFERENTIAL (CANCER CENTER ONLY)
Abs Immature Granulocytes: 0.04 10*3/uL (ref 0.00–0.07)
Basophils Absolute: 0.1 10*3/uL (ref 0.0–0.1)
Basophils Relative: 1 %
Eosinophils Absolute: 1.3 10*3/uL — ABNORMAL HIGH (ref 0.0–0.5)
Eosinophils Relative: 12 %
HCT: 30.3 % — ABNORMAL LOW (ref 36.0–46.0)
Hemoglobin: 9.9 g/dL — ABNORMAL LOW (ref 12.0–15.0)
Immature Granulocytes: 0 %
Lymphocytes Relative: 25 %
Lymphs Abs: 2.6 10*3/uL (ref 0.7–4.0)
MCH: 33.3 pg (ref 26.0–34.0)
MCHC: 32.7 g/dL (ref 30.0–36.0)
MCV: 102 fL — ABNORMAL HIGH (ref 80.0–100.0)
Monocytes Absolute: 1.3 10*3/uL — ABNORMAL HIGH (ref 0.1–1.0)
Monocytes Relative: 13 %
Neutro Abs: 5.2 10*3/uL (ref 1.7–7.7)
Neutrophils Relative %: 49 %
Platelet Count: 208 10*3/uL (ref 150–400)
RBC: 2.97 MIL/uL — ABNORMAL LOW (ref 3.87–5.11)
RDW: 15.2 % (ref 11.5–15.5)
WBC Count: 10.5 10*3/uL (ref 4.0–10.5)
nRBC: 0 % (ref 0.0–0.2)

## 2022-09-11 LAB — CMP (CANCER CENTER ONLY)
ALT: 17 U/L (ref 0–44)
AST: 34 U/L (ref 15–41)
Albumin: 3.1 g/dL — ABNORMAL LOW (ref 3.5–5.0)
Alkaline Phosphatase: 84 U/L (ref 38–126)
Anion gap: 7 (ref 5–15)
BUN: 12 mg/dL (ref 8–23)
CO2: 27 mmol/L (ref 22–32)
Calcium: 8.4 mg/dL — ABNORMAL LOW (ref 8.9–10.3)
Chloride: 111 mmol/L (ref 98–111)
Creatinine: 1.15 mg/dL — ABNORMAL HIGH (ref 0.44–1.00)
GFR, Estimated: 50 mL/min — ABNORMAL LOW (ref >60)
Glucose, Bld: 98 mg/dL (ref 70–99)
Potassium: 3.9 mmol/L (ref 3.5–5.1)
Sodium: 145 mmol/L (ref 135–145)
Total Bilirubin: 0.5 mg/dL (ref 0.3–1.2)
Total Protein: 6.5 g/dL (ref 6.5–8.1)

## 2022-09-11 LAB — CULTURE, BLOOD (ROUTINE X 2)

## 2022-09-11 MED ORDER — PROCHLORPERAZINE MALEATE 10 MG PO TABS
10.0000 mg | ORAL_TABLET | Freq: Once | ORAL | Status: AC
  Administered 2022-09-11: 10 mg via ORAL
  Filled 2022-09-11: qty 1

## 2022-09-11 MED ORDER — SODIUM CHLORIDE 0.9 % IV SOLN
400.0000 mg/m2 | Freq: Once | INTRAVENOUS | Status: AC
  Administered 2022-09-11: 800 mg via INTRAVENOUS
  Filled 2022-09-11: qty 20

## 2022-09-11 MED ORDER — SODIUM CHLORIDE 0.9 % IV SOLN
200.0000 mg | Freq: Once | INTRAVENOUS | Status: AC
  Administered 2022-09-11: 200 mg via INTRAVENOUS
  Filled 2022-09-11: qty 200

## 2022-09-11 MED ORDER — SODIUM CHLORIDE 0.9 % IV SOLN: Freq: Once | INTRAVENOUS | Status: AC

## 2022-09-11 NOTE — Progress Notes (Signed)
[address] [facility] [phone]   [phone]  OFFICE PROGRESS NOTE  [name], MD [address] [place] [address]  DIAGNOSIS: Metastatic non-small cell lung cancer, adenocarcinoma that was initially diagnosed as stage IIb (T1b, N1, M0) non-small cell lung cancer, adenocarcinoma presented with right upper lobe lung nodule  Detected Alteration(s) / Biomarker(s) Associated FDA-approved therapies Clinical Trial Availability % cfDNA or Amplification BRAF V600E approved by FDA Dabrafenib+trametinib, Encorafenib+binimetinib approved in other indication Cobimetinib, Dabrafenib, Trametinib, Vemurafenib, Vemurafenib+cobimetinib Yes 8.3%  BRCA1 R252S None (VUS) None (VUS) 0.1%   SMAD4 D411fs None None 7.2%  PD-L1 expression 35%  PRIOR THERAPY:  1) Status post right upper lobectomy with lymph node dissection on September 30, 2019 in [place].  She declines adjuvant systemic chemotherapy. 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 Mitzi Hansen.  CURRENT THERAPY: Systemic chemotherapy with carboplatin for AUC of 5, Alimta 500 Mg/M2 and Keytruda 200 Mg IV every 3 weeks.  First dose April 16, 2022.  Status post 6 cycles.  Starting cycle #5 she will be on maintenance treatment with Alimta and Keytruda every 3 weeks.  INTERVAL HISTORY: Brandi Holmes 72 y.o. female returns to the clinic today for follow-up visit.  The patient is feeling fine today with no concerning complaints.  She was seen in the hospital several days ago complaining of vomiting and diarrhea and she was diagnosed with rotavirus.  She received IV hydration and she felt much better.  She has some renal insufficiency at that time.  She is feeling well today.  She denied having any chest pain, shortness of breath, cough or hemoptysis.  She has no nausea, vomiting, diarrhea or  constipation.  She has no headache or visual changes.  She has no recent weight loss or night sweats.  She is here today for evaluation with repeat CT scan of the chest, abdomen and pelvis for restaging of her disease.   MEDICAL HISTORY: Past Medical History:  Diagnosis Date   Anemia    as a child only   Coronary artery calcification of native artery    Hyperlipidemia    Hypertension    Liver spots    " per pt"   Lung nodule    Wears glasses    Wears partial dentures     ALLERGIES:  is allergic to crab [shellfish allergy] and morphine and codeine.  MEDICATIONS:  Current Outpatient Medications  Medication Sig Dispense Refill   aspirin EC 81 MG tablet Take 81 mg by mouth at bedtime. Swallow whole.     atorvastatin (LIPITOR) 20 MG tablet Take 20 mg by mouth at bedtime.     folic acid (FOLVITE) 1 MG tablet Take 1 tablet (1 mg total) by mouth daily. 30 tablet 4   furosemide (LASIX) 20 MG tablet Take 20 mg by mouth daily as needed for fluid or edema.     lactose free nutrition (BOOST) LIQD Take 237 mLs by mouth 2 (two) times daily between meals.     lidocaine (LIDODERM) 5 % Place 1 patch onto the skin daily. Remove & Discard patch within 12 hours or as directed by MD (Patient taking differently: Place 1 patch onto the skin daily as needed (pain). Remove & Discard patch within 12 hours or as directed by MD) 30 patch 0   lisinopril (ZESTRIL) 20 MG tablet Take 20 mg by  mouth in the morning.     pregabalin (LYRICA) 150 MG capsule Take 150 mg by mouth 2 (two) times daily.     prochlorperazine (COMPAZINE) 10 MG tablet Take 1 tablet (10 mg total) by mouth every 6 (six) hours as needed for nausea or vomiting. 30 tablet 0   tiZANidine (ZANAFLEX) 2 MG tablet Take 2 mg by mouth at bedtime.     traMADol HCl 100 MG TABS Take 1 tablet by mouth in the morning, at noon, and at bedtime.     No current facility-administered medications for this visit.    SURGICAL HISTORY:  Past Surgical History:   Procedure Laterality Date   ABDOMINAL HYSTERECTOMY  1974   heavy bleeding   ABDOMINAL HYSTERECTOMY     BRONCHIAL BRUSHINGS  08/13/2019   Procedure: BRONCHIAL BRUSHINGS;  Surgeon: Josephine Igo, DO;  Location: [facility];  Service: Pulmonary;;   BRONCHIAL WASHINGS  08/13/2019   Procedure: BRONCHIAL WASHINGS;  Surgeon: Josephine Igo, DO;  Location: [facility];  Service: Pulmonary;;   COLONOSCOPY W/ BIOPSIES AND POLYPECTOMY     DILATION AND CURETTAGE OF UTERUS     FINE NEEDLE ASPIRATION  08/13/2019   Procedure: FINE NEEDLE ASPIRATION (FNA) LINEAR;  Surgeon: Josephine Igo, DO;  Location: [facility];  Service: Pulmonary;;   FOOT SURGERY     bilateral bunions and hammer toes   IR RADIOLOGIST EVAL & MGMT  05/20/2022   LUNG BIOPSY  08/13/2019   Procedure: LUNG BIOPSY;  Surgeon: Josephine Igo, DO;  Location: [facility];  Service: Pulmonary;;   MULTIPLE TOOTH EXTRACTIONS     VIDEO BRONCHOSCOPY WITH ENDOBRONCHIAL NAVIGATION Right 08/13/2019   Procedure: VIDEO BRONCHOSCOPY WITH ENDOBRONCHIAL NAVIGATION;  Surgeon: Josephine Igo, DO;  Location: [facility];  Service: Pulmonary;  Laterality: Right;   VIDEO BRONCHOSCOPY WITH ENDOBRONCHIAL ULTRASOUND Right 08/13/2019   Procedure: VIDEO BRONCHOSCOPY WITH ENDOBRONCHIAL ULTRASOUND;  Surgeon: Josephine Igo, DO;  Location: [facility];  Service: Pulmonary;  Laterality: Right;    REVIEW OF SYSTEMS:  Constitutional: positive for fatigue Eyes: negative Ears, nose, mouth, throat, and face: negative Respiratory: negative Cardiovascular: negative Gastrointestinal: negative Genitourinary:negative Integument/breast: negative Hematologic/lymphatic: negative Musculoskeletal:negative Neurological: negative Behavioral/Psych: negative Endocrine: negative Allergic/Immunologic: negative   PHYSICAL EXAMINATION: General appearance: alert, cooperative, fatigued, and no distress Head: Normocephalic, without obvious abnormality, atraumatic Neck:  no adenopathy, no JVD, supple, symmetrical, trachea midline, and thyroid not enlarged, symmetric, no tenderness/mass/nodules Lymph nodes: Cervical, supraclavicular, and axillary nodes normal. Resp: clear to auscultation bilaterally Back: symmetric, no curvature. ROM normal. No CVA tenderness. Cardio: regular rate and rhythm, S1, S2 normal, no murmur, click, rub or gallop GI: soft, non-tender; bowel sounds normal; no masses,  no organomegaly Extremities: extremities normal, atraumatic, no cyanosis or edema Neurologic: Alert and oriented X 3, normal strength and tone. Normal symmetric reflexes. Normal coordination and gait  ECOG PERFORMANCE STATUS: 1 - Symptomatic but completely ambulatory  Blood pressure (!) 141/75, pulse 88, temperature 98.1 F (36.7 C), temperature source Oral, resp. rate 17, weight 159 lb 9.6 oz (72.4 kg), SpO2 97 %.    LABORATORY DATA: Lab Results  Component Value Date   WBC 10.5 09/11/2022   HGB 9.9 (L) 09/11/2022   HCT 30.3 (L) 09/11/2022   MCV 102.0 (H) 09/11/2022   PLT 208 09/11/2022      Chemistry      Component Value Date/Time   NA 141 09/09/2022 0434   NA 142 07/22/2019 1522   K 3.7 09/09/2022 0434  CL 107 09/09/2022 0434   CO2 26 09/09/2022 0434   BUN 12 09/09/2022 0434   BUN 13 07/22/2019 1522   CREATININE 1.21 (H) 09/09/2022 0434   CREATININE 1.52 (H) 08/21/2022 0958   CREATININE 0.91 09/16/2011 1000      Component Value Date/Time   CALCIUM 9.0 09/09/2022 0434   ALKPHOS 66 09/06/2022 2126   AST 30 09/06/2022 2126   AST 30 08/21/2022 0958   ALT 15 09/06/2022 2126   ALT 11 08/21/2022 0958   BILITOT 0.9 09/06/2022 2126   BILITOT 0.5 08/21/2022 0958       RADIOGRAPHIC STUDIES: CT ABDOMEN PELVIS WO CONTRAST  Result Date: 09/07/2022 CLINICAL DATA:  Diarrhea Abdominal pain, acute, nonlocalized EXAM: CT ABDOMEN AND PELVIS WITHOUT CONTRAST TECHNIQUE: Multidetector CT imaging of the abdomen and pelvis was performed following the standard  protocol without IV contrast. RADIATION DOSE REDUCTION: This exam was performed according to the departmental dose-optimization program which includes automated exposure control, adjustment of the mA and/or kV according to patient size and/or use of iterative reconstruction technique. COMPARISON:  08/17/2022 FINDINGS: Lower chest: Medial right lower lobe airspace opacity with sharp demarcation is un changed since prior study and may reflect radiation fibrosis. Extensive interstitial thickening and opacities in the lung bases bilaterally, likely fibrosis, unchanged. Trace right pleural effusion, stable. Cardiomegaly, small pericardial effusion, stable. Hepatobiliary: Scattered hypodensities in the liver compatible cysts, stable. Gallbladder unremarkable. Pancreas: No focal abnormality or ductal dilatation. Spleen: No focal abnormality.  Normal size. Adrenals/Urinary Tract: Calcification in the lower pole of the right kidney again noted. The associated renal lesion/mass seen on prior contrast enhanced CT not well visualized on this noncontrast study. No stones or hydronephrosis. Adrenal glands unremarkable. No hydronephrosis. Urinary bladder decompressed, grossly unremarkable. Stomach/Bowel: Normal appendix. Stomach, large and small bowel grossly unremarkable. Vascular/Lymphatic: Aortoiliac atherosclerosis. No evidence of aneurysm or adenopathy. Reproductive: Prior hysterectomy.  No adnexal masses. Other: No free fluid or free air. Musculoskeletal: Widespread osseous sclerotic metastases again noted, stable. IMPRESSION: Changes of prior proces in the lung bases with probable radiation fibrosis in the medial right lower lung. Widespread sclerotic osseous metastases, stable. Aortic atherosclerosis. Previously described right renal lesion not well visualized on this noncontrast study. Associated calcifications within the right kidney are again noted, stable. No acute findings. Electronically Signed   By: Charlett Nose  M.D.   On: 09/07/2022 00:10   CT CHEST ABDOMEN PELVIS W CONTRAST  Result Date: 08/21/2022 CLINICAL DATA:  Non-small-cell lung cancer diagnosed 3 years ago with chemotherapy and radiation therapy. Right upper lobectomy. * Tracking Code: BO * EXAM: CT CHEST, ABDOMEN, AND PELVIS WITH CONTRAST TECHNIQUE: Multidetector CT imaging of the chest, abdomen and pelvis was performed following the standard protocol during bolus administration of intravenous contrast. RADIATION DOSE REDUCTION: This exam was performed according to the departmental dose-optimization program which includes automated exposure control, adjustment of the mA and/or kV according to patient size and/or use of iterative reconstruction technique. CONTRAST:  80mL OMNIPAQUE IOHEXOL 300 MG/ML  SOLN COMPARISON:  06/17/2022 FINDINGS: CT CHEST FINDINGS Cardiovascular: Aortic atherosclerosis. Tortuous thoracic aorta. Mild cardiomegaly, with small pericardial effusion, new. Lad and right coronary artery calcification. No central pulmonary embolism, on this non-dedicated study. Mediastinum/Nodes: No supraclavicular adenopathy. No mediastinal or hilar adenopathy. Lungs/Pleura: Tiny bilateral pleural effusions, similar on the right and increased on the left. Right paramediastinal presumed radiation fibrosis. There is also right lower lobe volume loss with airspace disease and bronchiectasis medially which may be radiation induced, similar. Right upper  lobectomy. Musculoskeletal: Multifocal osseous metastasis, including a dominant mass centered about the right side of the T9 vertebral body with extension into the adjacent posterior right ribs. This is slightly more sclerotic today, suggesting partial interval healing. Example 33/2. Compression deformities involving T1, T8-T11 are similar. Multiple remote right rib fractures. CT ABDOMEN PELVIS FINDINGS Hepatobiliary: Scattered hepatic cysts including up to 1.0 cm. No suspicious liver lesion or biliary abnormality.  Pancreas: Normal, without mass or ductal dilatation. Spleen: Normal in size, without focal abnormality. Adrenals/Urinary Tract: Normal adrenal glands. Partially calcified inter/lower pole right renal hypoattenuating lesion measures 2.0 x 1.9 cm on 70/2 versus 2.2 x 2.1 cm on the prior. Normal left kidney. No hydronephrosis. Normal urinary bladder. Stomach/Bowel: Normal stomach, without wall thickening. Normal colon, appendix, and terminal ileum. Normal small bowel. Vascular/Lymphatic: Aortic atherosclerosis. No abdominopelvic adenopathy. Reproductive: Hysterectomy.  No adnexal mass. Other: No significant free fluid. No free intraperitoneal air. No evidence of omental or peritoneal disease. Musculoskeletal: Similar multifocal sclerotic osseous metastasis. Dominant L5 lesion of 4.9 cm is unchanged. IMPRESSION: 1. Widespread osseous metastasis, primarily similar. Lower thoracic spine involvement with suggestion of increased sclerosis, possibly representing partial healing. 2. No progressive soft tissue metastasis identified. 3. Status post right upper lobectomy. 4. New small pericardial effusion. Tiny bilateral pleural effusions, similar on the right and increased on the left. 5. Similar right renal mass which remains suspicious for renal cell carcinoma. 6. Aortic atherosclerosis (ICD10-I70.0), coronary artery atherosclerosis and emphysema (ICD10-J43.9). Electronically Signed   By: Jeronimo Greaves M.D.   On: 08/21/2022 10:30     ASSESSMENT AND PLAN: This is a pleasant 72 years old African-American female diagnosed with metastatic also lung cancer, adenocarcinoma initially diagnosed as stage IIb (T1b, N1, M0) non-small cell lung cancer, adenocarcinoma status post a right upper lobectomy with lymph node dissection on September 30, 2019 in [place].  She declined adjuvant systemic chemotherapy at that time. She was found to have evidence for disease metastasis in November 2023 presenting with multiple metastatic bone  lesions. The patient had molecular studies that showed an actionable mutation with BRAF V600E mutation and PD-L1 expression of 35%. She underwent palliative radiotherapy to the T9 vertebral lesion under the care of Dr. Mitzi Hansen. The patient is currently undergoing first-line combination of systemic chemotherapy with carboplatin for AUC of 5, Alimta 500 Mg/M2 and Keytruda 200 Mg IV every 3 weeks.  Status post 7 cycles.  Starting from cycle #5 she will be on maintenance treatment with Alimta and Keytruda every 3 weeks. The patient has been tolerating this treatment fairly well with no concerning adverse effects. She had repeat CT scan of the chest, abdomen and pelvis performed recently.  I personally and independently reviewed the scans and discussed the result with the patient today. Her scan showed no concerning findings for disease progression. I recommended for her to continue her current maintenance treatment with Alimta and Keytruda every 3 weeks and she will proceed with cycle #8 today. She will come back for follow-up visit in 3 weeks for evaluation before starting cycle #9. For the pain management she will continue her current treatment with tramadol. The patient was advised to call immediately if she has any other concerning symptoms in the interval. The patient voices understanding of current disease status and treatment options and is in agreement with the current care plan. The total time spent in the appointment was 30 minutes.  All questions were answered. The patient knows to call the clinic with any problems, questions or concerns.  We can certainly see the patient much sooner if necessary.  Disclaimer: This note was dictated with voice recognition software. Similar sounding words can inadvertently be transcribed and may not be corrected upon review.

## 2022-09-11 NOTE — Patient Instructions (Signed)
[facility]  Discharge Instructions: Thank you for choosing [facility] to provide your oncology and hematology care.   If you have a lab appointment with the Cancer Center, please go directly to the Cancer Center and check in at the registration area.   Wear comfortable clothing and clothing appropriate for easy access to any Portacath or PICC line.   We strive to give you quality time with your provider. You may need to reschedule your appointment if you arrive late (15 or more minutes).  Arriving late affects you and other patients whose appointments are after yours.  Also, if you miss three or more appointments without notifying the office, you may be dismissed from the clinic at the provider's discretion.      For prescription refill requests, have your pharmacy contact our office and allow 72 hours for refills to be completed.    Today you received the following chemotherapy and/or immunotherapy agents: Keytruda, Alimta.       To help prevent nausea and vomiting after your treatment, we encourage you to take your nausea medication as directed.  BELOW ARE SYMPTOMS THAT SHOULD BE REPORTED IMMEDIATELY: *FEVER GREATER THAN 100.4 F (38 C) OR HIGHER *CHILLS OR SWEATING *NAUSEA AND VOMITING THAT IS NOT CONTROLLED WITH YOUR NAUSEA MEDICATION *UNUSUAL SHORTNESS OF BREATH *UNUSUAL BRUISING OR BLEEDING *URINARY PROBLEMS (pain or burning when urinating, or frequent urination) *BOWEL PROBLEMS (unusual diarrhea, constipation, pain near the anus) TENDERNESS IN MOUTH AND THROAT WITH OR WITHOUT PRESENCE OF ULCERS (sore throat, sores in mouth, or a toothache) UNUSUAL RASH, SWELLING OR PAIN  UNUSUAL VAGINAL DISCHARGE OR ITCHING   Items with * indicate a potential emergency and should be followed up as soon as possible or go to the Emergency Department if any problems should occur.  Please show the CHEMOTHERAPY ALERT CARD or IMMUNOTHERAPY ALERT CARD  at check-in to the Emergency Department and triage nurse.  Should you have questions after your visit or need to cancel or reschedule your appointment, please contact [facility]  Dept: [phone]  and follow the prompts.  Office hours are 8:00 a.m. to 4:30 p.m. Monday - Friday. Please note that voicemails left after 4:00 p.m. may not be returned until the following business day.  We are closed weekends and major holidays. You have access to a nurse at all times for urgent questions. Please call the main number to the clinic Dept: [phone] and follow the prompts.   For any non-urgent questions, you may also contact your provider using MyChart. We now offer e-Visits for anyone 18 and older to request care online for non-urgent symptoms. For details visit [email or web address].   Also download the MyChart app! Go to the app store, search "MyChart", open the app, select [facility], and log in with your MyChart username and password.   

## 2022-09-11 NOTE — Progress Notes (Signed)
Patient seen by Dr. Mohamed  Vitals are within treatment parameters.  Labs reviewed: and are within treatment parameters.  Per physician team, patient is ready for treatment and there are NO modifications to the treatment plan. Pemetrexed, Pembrolizumab 

## 2022-09-12 LAB — CULTURE, BLOOD (ROUTINE X 2)

## 2022-09-14 ENCOUNTER — Other Ambulatory Visit: Payer: Self-pay | Admitting: Physician Assistant

## 2022-09-18 ENCOUNTER — Other Ambulatory Visit: Payer: Self-pay

## 2022-09-29 ENCOUNTER — Other Ambulatory Visit: Payer: Self-pay

## 2022-10-02 ENCOUNTER — Other Ambulatory Visit: Payer: Self-pay | Admitting: Medical Oncology

## 2022-10-02 ENCOUNTER — Encounter: Payer: Self-pay | Admitting: Internal Medicine

## 2022-10-02 ENCOUNTER — Inpatient Hospital Stay (HOSPITAL_BASED_OUTPATIENT_CLINIC_OR_DEPARTMENT_OTHER): Payer: Medicare HMO | Admitting: Internal Medicine

## 2022-10-02 ENCOUNTER — Inpatient Hospital Stay: Payer: Medicare HMO

## 2022-10-02 ENCOUNTER — Other Ambulatory Visit: Payer: Self-pay

## 2022-10-02 ENCOUNTER — Inpatient Hospital Stay: Payer: Medicare HMO | Attending: Physician Assistant

## 2022-10-02 VITALS — BP 97/52 | HR 81 | Temp 98.1°F | Resp 17 | Ht 66.0 in | Wt 158.1 lb

## 2022-10-02 VITALS — BP 105/54 | HR 69 | Resp 18

## 2022-10-02 DIAGNOSIS — E860 Dehydration: Secondary | ICD-10-CM

## 2022-10-02 DIAGNOSIS — C7951 Secondary malignant neoplasm of bone: Secondary | ICD-10-CM

## 2022-10-02 DIAGNOSIS — Z79620 Long term (current) use of immunosuppressive biologic: Secondary | ICD-10-CM | POA: Insufficient documentation

## 2022-10-02 DIAGNOSIS — C3411 Malignant neoplasm of upper lobe, right bronchus or lung: Secondary | ICD-10-CM | POA: Diagnosis not present

## 2022-10-02 DIAGNOSIS — Z5112 Encounter for antineoplastic immunotherapy: Secondary | ICD-10-CM | POA: Insufficient documentation

## 2022-10-02 LAB — CBC WITH DIFFERENTIAL (CANCER CENTER ONLY)
Abs Immature Granulocytes: 0.03 10*3/uL (ref 0.00–0.07)
Basophils Absolute: 0.1 10*3/uL (ref 0.0–0.1)
Basophils Relative: 1 %
Eosinophils Absolute: 1.5 10*3/uL — ABNORMAL HIGH (ref 0.0–0.5)
Eosinophils Relative: 15 %
HCT: 28.1 % — ABNORMAL LOW (ref 36.0–46.0)
Hemoglobin: 8.9 g/dL — ABNORMAL LOW (ref 12.0–15.0)
Immature Granulocytes: 0 %
Lymphocytes Relative: 20 %
Lymphs Abs: 2 10*3/uL (ref 0.7–4.0)
MCH: 32.8 pg (ref 26.0–34.0)
MCHC: 31.7 g/dL (ref 30.0–36.0)
MCV: 103.7 fL — ABNORMAL HIGH (ref 80.0–100.0)
Monocytes Absolute: 1.4 10*3/uL — ABNORMAL HIGH (ref 0.1–1.0)
Monocytes Relative: 14 %
Neutro Abs: 4.9 10*3/uL (ref 1.7–7.7)
Neutrophils Relative %: 50 %
Platelet Count: 230 10*3/uL (ref 150–400)
RBC: 2.71 MIL/uL — ABNORMAL LOW (ref 3.87–5.11)
RDW: 15.9 % — ABNORMAL HIGH (ref 11.5–15.5)
WBC Count: 9.9 10*3/uL (ref 4.0–10.5)
nRBC: 0 % (ref 0.0–0.2)

## 2022-10-02 LAB — CMP (CANCER CENTER ONLY)
ALT: 6 U/L (ref 0–44)
AST: 20 U/L (ref 15–41)
Albumin: 2.9 g/dL — ABNORMAL LOW (ref 3.5–5.0)
Alkaline Phosphatase: 68 U/L (ref 38–126)
Anion gap: 7 (ref 5–15)
BUN: 22 mg/dL (ref 8–23)
CO2: 25 mmol/L (ref 22–32)
Calcium: 8.5 mg/dL — ABNORMAL LOW (ref 8.9–10.3)
Chloride: 108 mmol/L (ref 98–111)
Creatinine: 2.01 mg/dL — ABNORMAL HIGH (ref 0.44–1.00)
GFR, Estimated: 26 mL/min — ABNORMAL LOW (ref >60)
Glucose, Bld: 111 mg/dL — ABNORMAL HIGH (ref 70–99)
Potassium: 3.9 mmol/L (ref 3.5–5.1)
Sodium: 140 mmol/L (ref 135–145)
Total Bilirubin: 0.4 mg/dL (ref 0.3–1.2)
Total Protein: 6.1 g/dL — ABNORMAL LOW (ref 6.5–8.1)

## 2022-10-02 LAB — TSH: TSH: 1.451 u[IU]/mL (ref 0.350–4.500)

## 2022-10-02 MED ORDER — SODIUM CHLORIDE 0.9 % IV SOLN
200.0000 mg | Freq: Once | INTRAVENOUS | Status: AC
  Administered 2022-10-02: 200 mg via INTRAVENOUS
  Filled 2022-10-02: qty 200

## 2022-10-02 MED ORDER — SODIUM CHLORIDE 0.9 % IV SOLN: Freq: Once | INTRAVENOUS | Status: AC

## 2022-10-02 MED ORDER — SODIUM CHLORIDE 0.9 % IV SOLN: INTRAVENOUS | Status: AC

## 2022-10-02 NOTE — Patient Instructions (Signed)
[facility]  Discharge Instructions: Thank you for choosing [facility] to provide your oncology and hematology care.   If you have a lab appointment with the Cancer Center, please go directly to the Cancer Center and check in at the registration area.   Wear comfortable clothing and clothing appropriate for easy access to any Portacath or PICC line.   We strive to give you quality time with your provider. You may need to reschedule your appointment if you arrive late (15 or more minutes).  Arriving late affects you and other patients whose appointments are after yours.  Also, if you miss three or more appointments without notifying the office, you may be dismissed from the clinic at the provider's discretion.      For prescription refill requests, have your pharmacy contact our office and allow 72 hours for refills to be completed.    Today you received the following chemotherapy and/or immunotherapy agents: Keytruda      To help prevent nausea and vomiting after your treatment, we encourage you to take your nausea medication as directed.  BELOW ARE SYMPTOMS THAT SHOULD BE REPORTED IMMEDIATELY: *FEVER GREATER THAN 100.4 F (38 C) OR HIGHER *CHILLS OR SWEATING *NAUSEA AND VOMITING THAT IS NOT CONTROLLED WITH YOUR NAUSEA MEDICATION *UNUSUAL SHORTNESS OF BREATH *UNUSUAL BRUISING OR BLEEDING *URINARY PROBLEMS (pain or burning when urinating, or frequent urination) *BOWEL PROBLEMS (unusual diarrhea, constipation, pain near the anus) TENDERNESS IN MOUTH AND THROAT WITH OR WITHOUT PRESENCE OF ULCERS (sore throat, sores in mouth, or a toothache) UNUSUAL RASH, SWELLING OR PAIN  UNUSUAL VAGINAL DISCHARGE OR ITCHING   Items with * indicate a potential emergency and should be followed up as soon as possible or go to the Emergency Department if any problems should occur.  Please show the CHEMOTHERAPY ALERT CARD or IMMUNOTHERAPY ALERT CARD at  check-in to the Emergency Department and triage nurse.  Should you have questions after your visit or need to cancel or reschedule your appointment, please contact [facility]  Dept: [phone]  and follow the prompts.  Office hours are 8:00 a.m. to 4:30 p.m. Monday - Friday. Please note that voicemails left after 4:00 p.m. may not be returned until the following business day.  We are closed weekends and major holidays. You have access to a nurse at all times for urgent questions. Please call the main number to the clinic Dept: [phone] and follow the prompts.   For any non-urgent questions, you may also contact your provider using MyChart. We now offer e-Visits for anyone 18 and older to request care online for non-urgent symptoms. For details visit [email or web address].   Also download the MyChart app! Go to the app store, search "MyChart", open the app, select [facility], and log in with your MyChart username and password.   

## 2022-10-02 NOTE — Progress Notes (Signed)
[facility] [phone]   [phone]  OFFICE PROGRESS NOTE  Brandi Able, MD [address] [place] [address]  DIAGNOSIS: Metastatic non-small cell lung cancer, adenocarcinoma that was initially diagnosed as stage IIb (T1b, N1, M0) non-small cell lung cancer, adenocarcinoma presented with right upper lobe lung nodule  Detected Alteration(s) / Biomarker(s) Associated FDA-approved therapies Clinical Trial Availability % cfDNA or Amplification BRAF V600E approved by FDA Dabrafenib+trametinib, Encorafenib+binimetinib approved in other indication Cobimetinib, Dabrafenib, Trametinib, Vemurafenib, Vemurafenib+cobimetinib Yes 8.3%  BRCA1 R252S None (VUS) None (VUS) 0.1%   SMAD4 D465fs None None 7.2%  PD-L1 expression 35%  PRIOR THERAPY:  1) Status post right upper lobectomy with lymph node dissection on September 30, 2019 in [place].  She declines adjuvant systemic chemotherapy. 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 Mitzi Hansen.  CURRENT THERAPY: Systemic chemotherapy with carboplatin for AUC of 5, Alimta 500 Mg/M2 and Keytruda 200 Mg IV every 3 weeks.  First dose April 16, 2022.  Status post 8 cycles.  Starting cycle #5 she will be on maintenance treatment with Alimta and Keytruda every 3 weeks.  INTERVAL HISTORY: Brandi Holmes 72 y.o. female returns to the clinic today for follow-up visit.  The patient continues to complain of increasing fatigue and weakness as well as blurry vision at times.  She does not drive anymore and she is using the medical transportation shuttle.  She denied having any current chest pain, shortness of breath except with exertion with no cough or hemoptysis.  She has no nausea, vomiting, diarrhea but has constipation and she uses milk of magnesia.  She was admitted to the hospital few weeks ago  with rotavirus and dehydration.  She had renal insufficiency during her hospitalization improved with hydration.  She has no recent weight loss or night sweats.  She is here for evaluation before starting cycle #9 of her treatment.    MEDICAL HISTORY: Past Medical History:  Diagnosis Date   Anemia    as a child only   Coronary artery calcification of native artery    Hyperlipidemia    Hypertension    Liver spots    " per pt"   Lung nodule    Wears glasses    Wears partial dentures     ALLERGIES:  is allergic to crab [shellfish allergy] and morphine and codeine.  MEDICATIONS:  Current Outpatient Medications  Medication Sig Dispense Refill   aspirin EC 81 MG tablet Take 81 mg by mouth at bedtime. Swallow whole.     atorvastatin (LIPITOR) 20 MG tablet Take 20 mg by mouth at bedtime.     folic acid (FOLVITE) 1 MG tablet TAKE ONE TABLET BY MOUTH DAILY 30 tablet 0   furosemide (LASIX) 20 MG tablet Take 20 mg by mouth daily as needed for fluid or edema.     lactose free nutrition (BOOST) LIQD Take 237 mLs by mouth 2 (two) times daily between meals.     lidocaine (LIDODERM) 5 % Place 1 patch onto the skin daily. Remove & Discard patch within 12 hours or as directed by MD (Patient taking differently: Place 1 patch onto the skin daily as needed (pain). Remove & Discard patch within 12 hours or as directed by MD) 30 patch 0   lisinopril (ZESTRIL) 20 MG tablet Take 20 mg by mouth in the morning.  pregabalin (LYRICA) 150 MG capsule Take 150 mg by mouth 2 (two) times daily.     prochlorperazine (COMPAZINE) 10 MG tablet Take 1 tablet (10 mg total) by mouth every 6 (six) hours as needed for nausea or vomiting. 30 tablet 0   tiZANidine (ZANAFLEX) 2 MG tablet Take 2 mg by mouth at bedtime.     traMADol HCl 100 MG TABS Take 1 tablet by mouth in the morning, at noon, and at bedtime.     No current facility-administered medications for this visit.    SURGICAL HISTORY:  Past Surgical History:   Procedure Laterality Date   ABDOMINAL HYSTERECTOMY  1974   heavy bleeding   ABDOMINAL HYSTERECTOMY     BRONCHIAL BRUSHINGS  08/13/2019   Procedure: BRONCHIAL BRUSHINGS;  Surgeon: Josephine Igo, DO;  Location: [facility];  Service: Pulmonary;;   BRONCHIAL WASHINGS  08/13/2019   Procedure: BRONCHIAL WASHINGS;  Surgeon: Josephine Igo, DO;  Location: [facility];  Service: Pulmonary;;   COLONOSCOPY W/ BIOPSIES AND POLYPECTOMY     DILATION AND CURETTAGE OF UTERUS     FINE NEEDLE ASPIRATION  08/13/2019   Procedure: FINE NEEDLE ASPIRATION (FNA) LINEAR;  Surgeon: Josephine Igo, DO;  Location: [facility];  Service: Pulmonary;;   FOOT SURGERY     bilateral bunions and hammer toes   IR RADIOLOGIST EVAL & MGMT  05/20/2022   LUNG BIOPSY  08/13/2019   Procedure: LUNG BIOPSY;  Surgeon: Josephine Igo, DO;  Location: [facility];  Service: Pulmonary;;   MULTIPLE TOOTH EXTRACTIONS     VIDEO BRONCHOSCOPY WITH ENDOBRONCHIAL NAVIGATION Right 08/13/2019   Procedure: VIDEO BRONCHOSCOPY WITH ENDOBRONCHIAL NAVIGATION;  Surgeon: Josephine Igo, DO;  Location: [facility];  Service: Pulmonary;  Laterality: Right;   VIDEO BRONCHOSCOPY WITH ENDOBRONCHIAL ULTRASOUND Right 08/13/2019   Procedure: VIDEO BRONCHOSCOPY WITH ENDOBRONCHIAL ULTRASOUND;  Surgeon: Josephine Igo, DO;  Location: [facility];  Service: Pulmonary;  Laterality: Right;    REVIEW OF SYSTEMS:  Constitutional: positive for fatigue Eyes: negative Ears, nose, mouth, throat, and face: negative Respiratory: negative Cardiovascular: negative Gastrointestinal: positive for constipation Genitourinary:negative Integument/breast: negative Hematologic/lymphatic: negative Musculoskeletal:positive for muscle weakness Neurological: negative Behavioral/Psych: negative Endocrine: negative Allergic/Immunologic: negative   PHYSICAL EXAMINATION: General appearance: alert, cooperative, fatigued, and no distress Head: Normocephalic, without  obvious abnormality, atraumatic Neck: no adenopathy, no JVD, supple, symmetrical, trachea midline, and thyroid not enlarged, symmetric, no tenderness/mass/nodules Lymph nodes: Cervical, supraclavicular, and axillary nodes normal. Resp: clear to auscultation bilaterally Back: symmetric, no curvature. ROM normal. No CVA tenderness. Cardio: regular rate and rhythm, S1, S2 normal, no murmur, click, rub or gallop GI: soft, non-tender; bowel sounds normal; no masses,  no organomegaly Extremities: extremities normal, atraumatic, no cyanosis or edema Neurologic: Alert and oriented X 3, normal strength and tone. Normal symmetric reflexes. Normal coordination and gait  ECOG PERFORMANCE STATUS: 1 - Symptomatic but completely ambulatory  Blood pressure (!) 97/52, pulse 81, temperature 98.1 F (36.7 C), temperature source Oral, resp. rate 17, height 5\' 6"  (1.676 m), weight 158 lb 1.6 oz (71.7 kg), SpO2 98 %.    LABORATORY DATA: Lab Results  Component Value Date   WBC 9.9 10/02/2022   HGB 8.9 (L) 10/02/2022   HCT 28.1 (L) 10/02/2022   MCV 103.7 (H) 10/02/2022   PLT 230 10/02/2022      Chemistry      Component Value Date/Time   NA 145 09/11/2022 0956   NA 142 07/22/2019 1522   K 3.9 09/11/2022 0956  CL 111 09/11/2022 0956   CO2 27 09/11/2022 0956   BUN 12 09/11/2022 0956   BUN 13 07/22/2019 1522   CREATININE 1.15 (H) 09/11/2022 0956   CREATININE 0.91 09/16/2011 1000      Component Value Date/Time   CALCIUM 8.4 (L) 09/11/2022 0956   ALKPHOS 84 09/11/2022 0956   AST 34 09/11/2022 0956   ALT 17 09/11/2022 0956   BILITOT 0.5 09/11/2022 0956       RADIOGRAPHIC STUDIES: CT ABDOMEN PELVIS WO CONTRAST  Result Date: 09/07/2022 CLINICAL DATA:  Diarrhea Abdominal pain, acute, nonlocalized EXAM: CT ABDOMEN AND PELVIS WITHOUT CONTRAST TECHNIQUE: Multidetector CT imaging of the abdomen and pelvis was performed following the standard protocol without IV contrast. RADIATION DOSE REDUCTION: This  exam was performed according to the departmental dose-optimization program which includes automated exposure control, adjustment of the mA and/or kV according to patient size and/or use of iterative reconstruction technique. COMPARISON:  08/17/2022 FINDINGS: Lower chest: Medial right lower lobe airspace opacity with sharp demarcation is un changed since prior study and may reflect radiation fibrosis. Extensive interstitial thickening and opacities in the lung bases bilaterally, likely fibrosis, unchanged. Trace right pleural effusion, stable. Cardiomegaly, small pericardial effusion, stable. Hepatobiliary: Scattered hypodensities in the liver compatible cysts, stable. Gallbladder unremarkable. Pancreas: No focal abnormality or ductal dilatation. Spleen: No focal abnormality.  Normal size. Adrenals/Urinary Tract: Calcification in the lower pole of the right kidney again noted. The associated renal lesion/mass seen on prior contrast enhanced CT not well visualized on this noncontrast study. No stones or hydronephrosis. Adrenal glands unremarkable. No hydronephrosis. Urinary bladder decompressed, grossly unremarkable. Stomach/Bowel: Normal appendix. Stomach, large and small bowel grossly unremarkable. Vascular/Lymphatic: Aortoiliac atherosclerosis. No evidence of aneurysm or adenopathy. Reproductive: Prior hysterectomy.  No adnexal masses. Other: No free fluid or free air. Musculoskeletal: Widespread osseous sclerotic metastases again noted, stable. IMPRESSION: Changes of prior proces in the lung bases with probable radiation fibrosis in the medial right lower lung. Widespread sclerotic osseous metastases, stable. Aortic atherosclerosis. Previously described right renal lesion not well visualized on this noncontrast study. Associated calcifications within the right kidney are again noted, stable. No acute findings. Electronically Signed   By: Charlett Nose M.D.   On: 09/07/2022 00:10     ASSESSMENT AND PLAN: This is  a pleasant 72 years old African-American female diagnosed with metastatic also lung cancer, adenocarcinoma initially diagnosed as stage IIb (T1b, N1, M0) non-small cell lung cancer, adenocarcinoma status post a right upper lobectomy with lymph node dissection on September 30, 2019 in [place].  She declined adjuvant systemic chemotherapy at that time. She was found to have evidence for disease metastasis in November 2023 presenting with multiple metastatic bone lesions. The patient had molecular studies that showed an actionable mutation with BRAF V600E mutation and PD-L1 expression of 35%. She underwent palliative radiotherapy to the T9 vertebral lesion under the care of Dr. Mitzi Hansen. The patient is currently undergoing first-line combination of systemic chemotherapy with carboplatin for AUC of 5, Alimta 500 Mg/M2 and Keytruda 200 Mg IV every 3 weeks.  Status post 8 cycles.  Starting from cycle #5 she will be on maintenance treatment with Alimta and Keytruda every 3 weeks.  Starting from cycle #5 she will be on single agent Keytruda only.  Alimta will be discontinued secondary to renal insufficiency. She has been tolerating her treatment well with no concerning complaints except for the fatigue secondary to chemotherapy-induced anemia. I recommended for her to proceed with cycle #9 today as planned  but only with single agent Keytruda because of the renal insufficiency.  We will discontinue Alimta at this point. For the hypotension, she is currently on lisinopril and Lasix.  I advised her to discuss with her cardiologist for adjustment of her medication.  I will arrange for the patient to receive 1 L of normal saline in the clinic today because of her hypotension and dehydration. For the anemia, I encouraged her to increase her iron rich diet.  She is concerned about using iron supplement because of the constipation. For the pain management she will continue her current treatment with tramadol. The patient will come  back for follow-up visit in 3 weeks for evaluation before starting cycle #10. She was advised to call immediately if she has any concerning symptoms in the interval. The patient voices understanding of current disease status and treatment options and is in agreement with the current care plan. The total time spent in the appointment was 30 minutes.  All questions were answered. The patient knows to call the clinic with any problems, questions or concerns. We can certainly see the patient much sooner if necessary.  Disclaimer: This note was dictated with voice recognition software. Similar sounding words can inadvertently be transcribed and may not be corrected upon review.

## 2022-10-02 NOTE — Progress Notes (Signed)
IV NS 1 L was given per MD notes. Pt w/o complaint upon her d/c home.

## 2022-10-04 LAB — T4: T4, Total: 7.1 ug/dL (ref 4.5–12.0)

## 2022-10-08 ENCOUNTER — Other Ambulatory Visit: Payer: Self-pay

## 2022-10-10 ENCOUNTER — Encounter: Payer: Self-pay | Admitting: Cardiology

## 2022-10-10 ENCOUNTER — Ambulatory Visit: Payer: Medicare HMO | Admitting: Cardiology

## 2022-10-10 VITALS — BP 102/64 | HR 88 | Ht 66.0 in | Wt 155.0 lb

## 2022-10-10 DIAGNOSIS — M7989 Other specified soft tissue disorders: Secondary | ICD-10-CM

## 2022-10-10 DIAGNOSIS — C3490 Malignant neoplasm of unspecified part of unspecified bronchus or lung: Secondary | ICD-10-CM

## 2022-10-10 DIAGNOSIS — I959 Hypotension, unspecified: Secondary | ICD-10-CM

## 2022-10-10 DIAGNOSIS — I2510 Atherosclerotic heart disease of native coronary artery without angina pectoris: Secondary | ICD-10-CM

## 2022-10-10 DIAGNOSIS — I10 Essential (primary) hypertension: Secondary | ICD-10-CM

## 2022-10-10 MED ORDER — LISINOPRIL 10 MG PO TABS: 10.0000 mg | ORAL_TABLET | Freq: Every morning | ORAL | 0 refills | Status: AC

## 2022-10-10 NOTE — Progress Notes (Signed)
Brandi Holmes Date of Birth: 1951/03/22 MRN: [id number] Primary Care Provider:Reese, Jocelyn Lamer, MD Primary Cardiologist: Tessa Lerner, DO (established care 07/15/2019)  Date: 10/10/22 Last Office Visit: 10/09/2021  Chief Complaint  Patient presents with   Coronary artery calcification of native artery   Follow-up    1 yr    HPI  Brandi Holmes is a 72 y.o. female whose past medical history and cardiac risk factors include: Coronary artery calcification, metastatic non-small cell lung cancer/adenocarcinoma/ s/p lobectomy with lymph node excision, hypertension, former smoker, postmenopausal female, advanced age, obesity.  She presents today for 1 year follow-up visit given her history of coronary artery calcification.  Since last office visit patient states that her cancer has come back and is currently on treatment with medical oncology.  In summary, patient states that she was initially on radiation and now followed by chemotherapy.  At times she is noted to have hypotension requiring IV fluids prior to her chemotherapy.  She has stopped Lasix and continues to be on lisinopril.  Denies anginal chest pain or heart failure symptoms.  FUNCTIONAL STATUS: No structured exercise program or daily routine.    ALLERGIES: Allergies  Allergen Reactions   Crab [Shellfish Allergy] Swelling    Swelling of feet   Morphine And Codeine Other (See Comments)    Nightmares      MEDICATION LIST PRIOR TO VISIT: Current Outpatient Medications on File Prior to Visit  Medication Sig Dispense Refill   aspirin EC 81 MG tablet Take 81 mg by mouth at bedtime. Swallow whole.     atorvastatin (LIPITOR) 20 MG tablet Take 20 mg by mouth at bedtime.     folic acid (FOLVITE) 1 MG tablet TAKE ONE TABLET BY MOUTH DAILY 30 tablet 0   lactose free nutrition (BOOST) LIQD Take 237 mLs by mouth 2 (two) times daily between meals.     lidocaine (LIDODERM) 5 % Place 1 patch onto the skin daily. Remove  & Discard patch within 12 hours or as directed by MD (Patient taking differently: Place 1 patch onto the skin daily as needed (pain). Remove & Discard patch within 12 hours or as directed by MD) 30 patch 0   pregabalin (LYRICA) 150 MG capsule Take 150 mg by mouth 2 (two) times daily.     prochlorperazine (COMPAZINE) 10 MG tablet Take 1 tablet (10 mg total) by mouth every 6 (six) hours as needed for nausea or vomiting. 30 tablet 0   tiZANidine (ZANAFLEX) 2 MG tablet Take 2 mg by mouth at bedtime.     traMADol HCl 100 MG TABS Take 1 tablet by mouth in the morning, at noon, and at bedtime.     No current facility-administered medications on file prior to visit.    PAST MEDICAL HISTORY: Past Medical History:  Diagnosis Date   Anemia    as a child only   Coronary artery calcification of native artery    Hyperlipidemia    Hypertension    Liver spots    " per pt"   Lung nodule    Wears glasses    Wears partial dentures     PAST SURGICAL HISTORY: Past Surgical History:  Procedure Laterality Date   ABDOMINAL HYSTERECTOMY  1974   heavy bleeding   ABDOMINAL HYSTERECTOMY     BRONCHIAL BRUSHINGS  08/13/2019   Procedure: BRONCHIAL BRUSHINGS;  Surgeon: Josephine Igo, DO;  Location: [facility];  Service: Pulmonary;;   BRONCHIAL WASHINGS  08/13/2019   Procedure: BRONCHIAL WASHINGS;  Surgeon: Josephine Igo, DO;  Location: [facility];  Service: Pulmonary;;   COLONOSCOPY W/ BIOPSIES AND POLYPECTOMY     DILATION AND CURETTAGE OF UTERUS     FINE NEEDLE ASPIRATION  08/13/2019   Procedure: FINE NEEDLE ASPIRATION (FNA) LINEAR;  Surgeon: Josephine Igo, DO;  Location: [facility];  Service: Pulmonary;;   FOOT SURGERY     bilateral bunions and hammer toes   IR RADIOLOGIST EVAL & MGMT  05/20/2022   LUNG BIOPSY  08/13/2019   Procedure: LUNG BIOPSY;  Surgeon: Josephine Igo, DO;  Location: [facility];  Service: Pulmonary;;   MULTIPLE TOOTH EXTRACTIONS     VIDEO BRONCHOSCOPY WITH  ENDOBRONCHIAL NAVIGATION Right 08/13/2019   Procedure: VIDEO BRONCHOSCOPY WITH ENDOBRONCHIAL NAVIGATION;  Surgeon: Josephine Igo, DO;  Location: [facility];  Service: Pulmonary;  Laterality: Right;   VIDEO BRONCHOSCOPY WITH ENDOBRONCHIAL ULTRASOUND Right 08/13/2019   Procedure: VIDEO BRONCHOSCOPY WITH ENDOBRONCHIAL ULTRASOUND;  Surgeon: Josephine Igo, DO;  Location: [facility];  Service: Pulmonary;  Laterality: Right;    FAMILY HISTORY: The patient family history includes Cancer in her paternal aunt; Heart disease (age of onset: 74) in her mother; Hypertension in her mother; Pneumonia in her brother.   SOCIAL HISTORY:  The patient  reports that she quit smoking about 8 years ago. Her smoking use included cigarettes. She has a 15.00 pack-year smoking history. She has never used smokeless tobacco. She reports that she does not currently use alcohol. She reports that she does not currently use drugs.  Review of Systems  Constitutional: Negative for chills and fever.  HENT:  Negative for hoarse voice and nosebleeds.   Eyes:  Negative for discharge, double vision and pain.  Cardiovascular:  Negative for chest pain, claudication, dyspnea on exertion, leg swelling, near-syncope, orthopnea, palpitations, paroxysmal nocturnal dyspnea and syncope.  Respiratory:  Negative for hemoptysis and shortness of breath.   Musculoskeletal:  Negative for muscle cramps and myalgias.  Gastrointestinal:  Negative for abdominal pain, constipation, diarrhea, hematemesis, hematochezia, melena, nausea and vomiting.  Neurological:  Negative for dizziness and light-headedness.   PHYSICAL EXAM:    10/10/2022    1:42 PM 10/02/2022    3:00 PM 10/02/2022   11:43 AM  Vitals with BMI  Height 5\' 6"   5\' 6"   Weight 155 lbs  158 lbs 2 oz  BMI 25.03  25.53  Systolic 102 105 97  Diastolic 64 54 52  Pulse 88 69 81   Physical Exam  Constitutional: No distress.  Age appropriate, hemodynamically stable.   Neck: No JVD  present.  Cardiovascular: Normal rate, regular rhythm, S1 normal, S2 normal, intact distal pulses and normal pulses. Exam reveals no gallop, no S3 and no S4.  No murmur heard. Pulmonary/Chest: Effort normal and breath sounds normal. No stridor. She has no wheezes. She has no rales.  Abdominal: Soft. Bowel sounds are normal. She exhibits no distension. There is no abdominal tenderness.  Musculoskeletal:        General: Edema (right > left) present.     Cervical back: Neck supple.  Neurological: She is alert and oriented to person, place, and time. She has intact cranial nerves (2-12).  Skin: Skin is warm and moist.   RADIOLOGY: CT chest without contrast 02/24/2020: 1. Surgical changes from a right upper lobe wedge resection. No findings suspicious for recurrent tumor. 2. Right paramediastinal subpleural atelectasis and mild bronchiectasis. 3. Slight interval enlargement of precarinal lymph nodes. Recommend continued surveillance. 4. Stable emphysematous changes. 5.  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 6. Stable three-vessel coronary artery calcifications. 7. Emphysema and aortic atherosclerosis. Aortic Atherosclerosis (ICD10-I70.0) and Emphysema (ICD10-J43.9).  CARDIAC DATABASE: EKG: 08/22/2020: Sinus bradycardia, 55 bpm, normal axis, without underlying injury pattern. 10/10/2022: Sinus rhythm, 77 bpm, few PACs, without underlying ischemia or injury pattern.  Echocardiogram: 07/27/2019: LVEF 55%, grade 1 diastolic impairment, mild LVH, mild TR, RVSP 26 mmHg.  Stress Testing:  Lexiscan Tetrofosmin stress test 10/02/2020: Exercise nuclear stress test was performed using Bruce protocol. Patient reached 6.2 METS, and 82% of age predicted maximum heart rate. Exercise capacity was low. No chest pain reported. Heart rate and hemodynamic response were normal. Peak EKG demonstrated sinus tachycardia, <82mm upsloping ST depressions  in leads II, III, aVF, which is negative for ischemia. Normal wall motion and thickening. Stress LVEF 78%. SPECT images showed small sized, mild intensity, fixed defect in apical anterolateral myocardium. In absence of significant wall motion abnormality, this likely represents tissue attenuation artifact. Low risk study.  Heart Catheterization: None  LABORATORY DATA:    Latest Ref Rng & Units 10/02/2022   11:22 AM 09/11/2022    9:56 AM 09/09/2022    4:34 AM  CBC  WBC 4.0 - 10.5 K/uL 9.9  10.5  12.6   Hemoglobin 12.0 - 15.0 g/dL 8.9  9.9  9.7   Hematocrit 36.0 - 46.0 % 28.1  30.3  31.4   Platelets 150 - 400 K/uL 230  208  231        Latest Ref Rng & Units 10/02/2022   11:22 AM 09/11/2022    9:56 AM 09/09/2022    4:34 AM  CMP  Glucose 70 - 99 mg/dL 782  98  75   BUN 8 - 23 mg/dL 22  12  12    Creatinine 0.44 - 1.00 mg/dL 9.56  2.13  0.86   Sodium 135 - 145 mmol/L 140  145  141   Potassium 3.5 - 5.1 mmol/L 3.9  3.9  3.7   Chloride 98 - 111 mmol/L 108  111  107   CO2 22 - 32 mmol/L 25  27  26    Calcium 8.9 - 10.3 mg/dL 8.5  8.4  9.0   Total Protein 6.5 - 8.1 g/dL 6.1  6.5    Total Bilirubin 0.3 - 1.2 mg/dL 0.4  0.5    Alkaline Phos 38 - 126 U/L 68  84    AST 15 - 41 U/L 20  34    ALT 0 - 44 U/L 6  17      Lipid Panel     Component Value Date/Time   CHOL 175 08/22/2020 1557   TRIG 74 08/22/2020 1557   HDL 55 08/22/2020 1557   CHOLHDL 3.7 09/16/2011 1000   VLDL 17 09/16/2011 1000   LDLCALC 106 (H) 08/22/2020 1557   LDLDIRECT 99 08/22/2020 1557   LABVLDL 14 08/22/2020 1557    No results found for: "HGBA1C" No components found for: "NTPROBNP" Lab Results  Component Value Date   TSH 1.451 10/02/2022   TSH 0.657 08/01/2022   TSH 0.706 05/30/2022     Lipid profile: Collected: 10/02/2019 at [facility] Total cholesterol 135, triglycerides 178, HDL 41, LDL 70.   External Labs: Collected: 06/04/2021 AST 15, ALT 11, alkaline phosphatase 71. Hemoglobin 12.7,  hematocrit 37.9%. Sodium 141, potassium 4.6, chloride 105, bicarb 27. BUN 12, creatinine 0.86 eGFR 71. Total cholesterol 114, triglycerides 71, LDL a 41, HDL  58, non-HDL 56. TSH 0.58  FINAL MEDICATION LIST END OF ENCOUNTER: Meds ordered this encounter  Medications   lisinopril (ZESTRIL) 10 MG tablet    Sig: Take 1 tablet (10 mg total) by mouth every morning.    Dispense:  90 tablet    Refill:  0    Medications Discontinued During This Encounter  Medication Reason   furosemide (LASIX) 20 MG tablet    lisinopril (ZESTRIL) 20 MG tablet      Current Outpatient Medications:    aspirin EC 81 MG tablet, Take 81 mg by mouth at bedtime. Swallow whole., Disp: , Rfl:    atorvastatin (LIPITOR) 20 MG tablet, Take 20 mg by mouth at bedtime., Disp: , Rfl:    folic acid (FOLVITE) 1 MG tablet, TAKE ONE TABLET BY MOUTH DAILY, Disp: 30 tablet, Rfl: 0   lactose free nutrition (BOOST) LIQD, Take 237 mLs by mouth 2 (two) times daily between meals., Disp: , Rfl:    lidocaine (LIDODERM) 5 %, Place 1 patch onto the skin daily. Remove & Discard patch within 12 hours or as directed by MD (Patient taking differently: Place 1 patch onto the skin daily as needed (pain). Remove & Discard patch within 12 hours or as directed by MD), Disp: 30 patch, Rfl: 0   lisinopril (ZESTRIL) 10 MG tablet, Take 1 tablet (10 mg total) by mouth every morning., Disp: 90 tablet, Rfl: 0   pregabalin (LYRICA) 150 MG capsule, Take 150 mg by mouth 2 (two) times daily., Disp: , Rfl:    prochlorperazine (COMPAZINE) 10 MG tablet, Take 1 tablet (10 mg total) by mouth every 6 (six) hours as needed for nausea or vomiting., Disp: 30 tablet, Rfl: 0   tiZANidine (ZANAFLEX) 2 MG tablet, Take 2 mg by mouth at bedtime., Disp: , Rfl:    traMADol HCl 100 MG TABS, Take 1 tablet by mouth in the morning, at noon, and at bedtime., Disp: , Rfl:   IMPRESSION:    ICD-10-CM   1. Coronary artery calcification of native artery  I25.10 EKG 12-Lead   I25.84  ECHOCARDIOGRAM COMPLETE    2. Leg swelling  M79.89 VAS [place] LOWER EXTREMITY VENOUS (DVT)    3. Essential hypertension  I10 lisinopril (ZESTRIL) 10 MG tablet    CANCELED: ECHOCARDIOGRAM COMPLETE    4. Hypotension, unspecified hypotension type  I95.9 lisinopril (ZESTRIL) 10 MG tablet    5. Metastatic non-small cell lung cancer (HCC)  C34.90 ECHOCARDIOGRAM COMPLETE       RECOMMENDATIONS: Brandi Holmes is a 72 y.o. female whose past medical history and cardiac risk factors include: Coronary artery calcification, metastatic non-small cell lung cancer/adenocarcinoma/ s/p lobectomy with lymph node excision, hypertension, former smoker, postmenopausal female, advanced age, obesity.  Coronary artery calcification of native artery Continue aspirin and statin therapy. Outside labs independently reviewed from February 2023. Prior cardiovascular workup reviewed as part of medical decision making. EKG shows sinus rhythm without underlying ischemia or injury pattern. Plan echo along with strain imaging given her metastatic lung cancer  Leg swelling Concern for lower extremity deep venous thrombosis. Right lower extremity is swollen compared to left.  Both are painful, glossy appearance. Ordered a stat lower extremity venous duplex. Patient understands its importance.   If she is not able to get outpatient appointment she is advised to go to the closest ER for evaluation. She verbalizes understanding.  Essential hypertension Hypotension, unspecified hypotension type Has been noted to be hypotensive at multiple chemotherapy appointments. Has also required IV hydration.  Lasix has been discontinued. Currently on lisinopril 20 mg p.o. daily. Ideally would recommend the discontinuation of lisinopril and continue low-dose Lasix on a daily basis with the exception of her chemotherapy.  However, patient states that she does not want to be on Lasix and wants to continue lisinopril.  Therefore,  shared decision was to hold off on Lasix reduce lisinopril to 10 mg p.o. daily  Metastatic non-small cell lung cancer (HCC) Has lost approximately 35 pounds since last annual office visit. Currently on chemotherapy. Plan echocardiogram with strain  She now requires medical transportation to and from doctors visits.  Keeping this in mind we will continue annual follow-up visits sooner if needed.  If the echocardiogram is abnormal I will call her in sooner.  Patient agreeable with the plan of care   Orders Placed This Encounter  Procedures   EKG 12-Lead   ECHOCARDIOGRAM COMPLETE   VAS [place] LOWER EXTREMITY VENOUS (DVT)   --Continue cardiac medications as reconciled in final medication list. --Return in about 1 year (around 10/10/2023) for Annual follow up visit, Coronary artery calcification. Or sooner if needed. --Continue follow-up with your primary care physician regarding the management of your other chronic comorbid conditions.  Patient's questions and concerns were addressed to her satisfaction. She voices understanding of the instructions provided during this encounter.   This note was created using a voice recognition software as a result there may be grammatical errors inadvertently enclosed that do not reflect the nature of this encounter. Every attempt is made to correct such errors.   Tessa Lerner, [place], [facility]  Pager:  [phone] Office: [phone]

## 2022-10-11 ENCOUNTER — Other Ambulatory Visit: Payer: Self-pay | Admitting: Physician Assistant

## 2022-10-11 DIAGNOSIS — G893 Neoplasm related pain (acute) (chronic): Secondary | ICD-10-CM

## 2022-10-12 ENCOUNTER — Other Ambulatory Visit: Payer: Self-pay

## 2022-10-16 ENCOUNTER — Ambulatory Visit (HOSPITAL_COMMUNITY)
Admission: RE | Admit: 2022-10-16 | Discharge: 2022-10-16 | Disposition: A | Discharge status: Home / Self Care | Payer: Medicare HMO | Source: Ambulatory Visit | Attending: Cardiology | Admitting: Cardiology

## 2022-10-16 ENCOUNTER — Telehealth: Payer: Self-pay

## 2022-10-16 ENCOUNTER — Encounter (HOSPITAL_COMMUNITY): Payer: Medicare HMO

## 2022-10-16 DIAGNOSIS — M7989 Other specified soft tissue disorders: Secondary | ICD-10-CM | POA: Insufficient documentation

## 2022-10-16 NOTE — Progress Notes (Signed)
Bilateral lower extremity venous duplex has been completed. Preliminary results can be found in CV Proc through chart review.  Results were given to Delice Bison at Dr. Emelda Brothers office.  10/16/22 2:40 PM Olen Cordial RVT

## 2022-10-16 NOTE — Telephone Encounter (Signed)
Radiology called pt is negative for dvt but there is a bakers cyst behind right knee

## 2022-10-16 NOTE — Telephone Encounter (Signed)
Okay. Will await final report.   Darris Staiger [place], DO, [facility]

## 2022-10-18 ENCOUNTER — Other Ambulatory Visit: Payer: Self-pay | Admitting: Cardiology

## 2022-10-18 DIAGNOSIS — M7989 Other specified soft tissue disorders: Secondary | ICD-10-CM

## 2022-10-18 NOTE — Progress Notes (Signed)
Left message with results on vm 

## 2022-10-18 NOTE — Progress Notes (Signed)
Cystic structure behind the right knee.  Given this finding and her right leg being more swollen then left - will refer to vascular and vein for further evaluation and guidance.   Zailey Audia Bryant, DO, [facility]

## 2022-10-22 NOTE — Progress Notes (Signed)
Called patient, NA, LMAM

## 2022-10-23 ENCOUNTER — Inpatient Hospital Stay: Payer: Medicare HMO

## 2022-10-23 ENCOUNTER — Other Ambulatory Visit: Payer: Self-pay

## 2022-10-23 ENCOUNTER — Inpatient Hospital Stay: Payer: Medicare HMO | Admitting: Dietician

## 2022-10-23 ENCOUNTER — Ambulatory Visit: Payer: Medicare HMO

## 2022-10-23 ENCOUNTER — Inpatient Hospital Stay: Payer: Medicare HMO | Attending: Physician Assistant | Admitting: Internal Medicine

## 2022-10-23 VITALS — BP 127/54 | HR 70 | Resp 16

## 2022-10-23 VITALS — BP 148/82 | HR 77 | Temp 98.1°F | Resp 17 | Ht 66.0 in | Wt 158.1 lb

## 2022-10-23 DIAGNOSIS — N289 Disorder of kidney and ureter, unspecified: Secondary | ICD-10-CM | POA: Insufficient documentation

## 2022-10-23 DIAGNOSIS — C7951 Secondary malignant neoplasm of bone: Secondary | ICD-10-CM | POA: Diagnosis not present

## 2022-10-23 DIAGNOSIS — Z90710 Acquired absence of both cervix and uterus: Secondary | ICD-10-CM | POA: Diagnosis not present

## 2022-10-23 DIAGNOSIS — Z5112 Encounter for antineoplastic immunotherapy: Secondary | ICD-10-CM | POA: Diagnosis present

## 2022-10-23 DIAGNOSIS — C3411 Malignant neoplasm of upper lobe, right bronchus or lung: Secondary | ICD-10-CM | POA: Insufficient documentation

## 2022-10-23 DIAGNOSIS — Z902 Acquired absence of lung [part of]: Secondary | ICD-10-CM | POA: Insufficient documentation

## 2022-10-23 DIAGNOSIS — C3490 Malignant neoplasm of unspecified part of unspecified bronchus or lung: Secondary | ICD-10-CM | POA: Diagnosis not present

## 2022-10-23 LAB — CMP (CANCER CENTER ONLY)
ALT: 8 U/L (ref 0–44)
AST: 24 U/L (ref 15–41)
Albumin: 3.2 g/dL — ABNORMAL LOW (ref 3.5–5.0)
Alkaline Phosphatase: 77 U/L (ref 38–126)
Anion gap: 6 (ref 5–15)
BUN: 12 mg/dL (ref 8–23)
CO2: 28 mmol/L (ref 22–32)
Calcium: 9.5 mg/dL (ref 8.9–10.3)
Chloride: 109 mmol/L (ref 98–111)
Creatinine: 1.15 mg/dL — ABNORMAL HIGH (ref 0.44–1.00)
GFR, Estimated: 50 mL/min — ABNORMAL LOW (ref >60)
Glucose, Bld: 80 mg/dL (ref 70–99)
Potassium: 4.4 mmol/L (ref 3.5–5.1)
Sodium: 143 mmol/L (ref 135–145)
Total Bilirubin: 0.5 mg/dL (ref 0.3–1.2)
Total Protein: 7.2 g/dL (ref 6.5–8.1)

## 2022-10-23 LAB — CBC WITH DIFFERENTIAL (CANCER CENTER ONLY)
Abs Immature Granulocytes: 0.02 10*3/uL (ref 0.00–0.07)
Basophils Absolute: 0.1 10*3/uL (ref 0.0–0.1)
Basophils Relative: 1 %
Eosinophils Absolute: 1.1 10*3/uL — ABNORMAL HIGH (ref 0.0–0.5)
Eosinophils Relative: 14 %
HCT: 32.3 % — ABNORMAL LOW (ref 36.0–46.0)
Hemoglobin: 10.1 g/dL — ABNORMAL LOW (ref 12.0–15.0)
Immature Granulocytes: 0 %
Lymphocytes Relative: 23 %
Lymphs Abs: 1.8 10*3/uL (ref 0.7–4.0)
MCH: 32.6 pg (ref 26.0–34.0)
MCHC: 31.3 g/dL (ref 30.0–36.0)
MCV: 104.2 fL — ABNORMAL HIGH (ref 80.0–100.0)
Monocytes Absolute: 0.9 10*3/uL (ref 0.1–1.0)
Monocytes Relative: 12 %
Neutro Abs: 3.9 10*3/uL (ref 1.7–7.7)
Neutrophils Relative %: 50 %
Platelet Count: 225 10*3/uL (ref 150–400)
RBC: 3.1 MIL/uL — ABNORMAL LOW (ref 3.87–5.11)
RDW: 14.6 % (ref 11.5–15.5)
WBC Count: 7.8 10*3/uL (ref 4.0–10.5)
nRBC: 0 % (ref 0.0–0.2)

## 2022-10-23 MED ORDER — SODIUM CHLORIDE 0.9 % IV SOLN: Freq: Once | INTRAVENOUS | Status: AC

## 2022-10-23 MED ORDER — SODIUM CHLORIDE 0.9 % IV SOLN
200.0000 mg | Freq: Once | INTRAVENOUS | Status: AC
  Administered 2022-10-23: 200 mg via INTRAVENOUS
  Filled 2022-10-23: qty 200

## 2022-10-23 NOTE — Progress Notes (Signed)
[facility] [phone]   [phone]  OFFICE PROGRESS NOTE  Brandi Able, MD [address] [place] [address]  DIAGNOSIS: Metastatic non-small cell lung cancer, adenocarcinoma that was initially diagnosed as stage IIb (T1b, N1, M0) non-small cell lung cancer, adenocarcinoma presented with right upper lobe lung nodule  Detected Alteration(s) / Biomarker(s) Associated FDA-approved therapies Clinical Trial Availability % cfDNA or Amplification BRAF V600E approved by FDA Dabrafenib+trametinib, Encorafenib+binimetinib approved in other indication Cobimetinib, Dabrafenib, Trametinib, Vemurafenib, Vemurafenib+cobimetinib Yes 8.3%  BRCA1 R252S None (VUS) None (VUS) 0.1%   SMAD4 D416fs None None 7.2%  PD-L1 expression 35%  PRIOR THERAPY:  1) Status post right upper lobectomy with lymph node dissection on September 30, 2019 in [place].  She declines adjuvant systemic chemotherapy. 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 Mitzi Hansen.  CURRENT THERAPY: Systemic chemotherapy with carboplatin for AUC of 5, Alimta 500 Mg/M2 and Keytruda 200 Mg IV every 3 weeks.  First dose April 16, 2022.  Status post 9 cycles.  Starting cycle #5 she will be on maintenance treatment with Alimta and Keytruda every 3 weeks.  INTERVAL HISTORY: Brandi Holmes 72 y.o. female returns to the clinic today for follow-up visit.  The patient is feeling fine today with no concerning complaints except for mild fatigue.  She was seen by her cardiologist recently and he felt that her blood pressure was on the soft side and he decided to decrease her blood pressure medication.  She was not happy with that decision and her blood pressure today is a little bit higher.  She will discuss with him and her primary care physician for adjustment of her medication.  She  denied having any shortness of breath except with exertion with no cough or hemoptysis but has intermittent right and left-sided chest pain.  She has no nausea, vomiting, diarrhea or constipation.  She has no headache or visual changes.  She is here today for evaluation before starting cycle #10 of her treatment.    MEDICAL HISTORY: Past Medical History:  Diagnosis Date   Anemia    as a child only   Coronary artery calcification of native artery    Hyperlipidemia    Hypertension    Liver spots    " per pt"   Lung nodule    Wears glasses    Wears partial dentures     ALLERGIES:  is allergic to crab [shellfish allergy] and morphine and codeine.  MEDICATIONS:  Current Outpatient Medications  Medication Sig Dispense Refill   aspirin EC 81 MG tablet Take 81 mg by mouth at bedtime. Swallow whole.     atorvastatin (LIPITOR) 20 MG tablet Take 20 mg by mouth at bedtime.     folic acid (FOLVITE) 1 MG tablet TAKE ONE TABLET BY MOUTH EVERY DAY 30 tablet 0   lactose free nutrition (BOOST) LIQD Take 237 mLs by mouth 2 (two) times daily between meals.     lidocaine (LIDODERM) 5 % Place 1 patch onto the skin daily. Remove & Discard patch within 12 hours or as directed by MD 30 patch 0   lisinopril (ZESTRIL) 10 MG tablet Take 1 tablet (10 mg total) by mouth every morning. 90 tablet 0   pregabalin (LYRICA) 150 MG capsule Take 150 mg by mouth 2 (two) times daily.     prochlorperazine (COMPAZINE) 10 MG  tablet Take 1 tablet (10 mg total) by mouth every 6 (six) hours as needed for nausea or vomiting. 30 tablet 0   tiZANidine (ZANAFLEX) 2 MG tablet Take 2 mg by mouth at bedtime.     traMADol HCl 100 MG TABS Take 1 tablet by mouth in the morning, at noon, and at bedtime.     No current facility-administered medications for this visit.    SURGICAL HISTORY:  Past Surgical History:  Procedure Laterality Date   ABDOMINAL HYSTERECTOMY  1974   heavy bleeding   ABDOMINAL HYSTERECTOMY     BRONCHIAL  BRUSHINGS  08/13/2019   Procedure: BRONCHIAL BRUSHINGS;  Surgeon: Josephine Igo, DO;  Location: [facility];  Service: Pulmonary;;   BRONCHIAL WASHINGS  08/13/2019   Procedure: BRONCHIAL WASHINGS;  Surgeon: Josephine Igo, DO;  Location: [facility];  Service: Pulmonary;;   COLONOSCOPY W/ BIOPSIES AND POLYPECTOMY     DILATION AND CURETTAGE OF UTERUS     FINE NEEDLE ASPIRATION  08/13/2019   Procedure: FINE NEEDLE ASPIRATION (FNA) LINEAR;  Surgeon: Josephine Igo, DO;  Location: [facility];  Service: Pulmonary;;   FOOT SURGERY     bilateral bunions and hammer toes   IR RADIOLOGIST EVAL & MGMT  05/20/2022   LUNG BIOPSY  08/13/2019   Procedure: LUNG BIOPSY;  Surgeon: Josephine Igo, DO;  Location: [facility];  Service: Pulmonary;;   MULTIPLE TOOTH EXTRACTIONS     VIDEO BRONCHOSCOPY WITH ENDOBRONCHIAL NAVIGATION Right 08/13/2019   Procedure: VIDEO BRONCHOSCOPY WITH ENDOBRONCHIAL NAVIGATION;  Surgeon: Josephine Igo, DO;  Location: [facility];  Service: Pulmonary;  Laterality: Right;   VIDEO BRONCHOSCOPY WITH ENDOBRONCHIAL ULTRASOUND Right 08/13/2019   Procedure: VIDEO BRONCHOSCOPY WITH ENDOBRONCHIAL ULTRASOUND;  Surgeon: Josephine Igo, DO;  Location: [facility];  Service: Pulmonary;  Laterality: Right;    REVIEW OF SYSTEMS:  A comprehensive review of systems was negative except for: Constitutional: positive for fatigue   PHYSICAL EXAMINATION: General appearance: alert, cooperative, fatigued, and no distress Head: Normocephalic, without obvious abnormality, atraumatic Neck: no adenopathy, no JVD, supple, symmetrical, trachea midline, and thyroid not enlarged, symmetric, no tenderness/mass/nodules Lymph nodes: Cervical, supraclavicular, and axillary nodes normal. Resp: clear to auscultation bilaterally Back: symmetric, no curvature. ROM normal. No CVA tenderness. Cardio: regular rate and rhythm, S1, S2 normal, no murmur, click, rub or gallop GI: soft, non-tender; bowel sounds  normal; no masses,  no organomegaly Extremities: extremities normal, atraumatic, no cyanosis or edema  ECOG PERFORMANCE STATUS: 1 - Symptomatic but completely ambulatory  Blood pressure (!) 148/82, pulse 77, temperature 98.1 F (36.7 C), temperature source Oral, resp. rate 17, height 5\' 6"  (1.676 m), weight 158 lb 1.6 oz (71.7 kg), SpO2 99 %.    LABORATORY DATA: Lab Results  Component Value Date   WBC 7.8 10/23/2022   HGB 10.1 (L) 10/23/2022   HCT 32.3 (L) 10/23/2022   MCV 104.2 (H) 10/23/2022   PLT 225 10/23/2022      Chemistry      Component Value Date/Time   NA 140 10/02/2022 1122   NA 142 07/22/2019 1522   K 3.9 10/02/2022 1122   CL 108 10/02/2022 1122   CO2 25 10/02/2022 1122   BUN 22 10/02/2022 1122   BUN 13 07/22/2019 1522   CREATININE 2.01 (H) 10/02/2022 1122   CREATININE 0.91 09/16/2011 1000      Component Value Date/Time   CALCIUM 8.5 (L) 10/02/2022 1122   ALKPHOS 68 10/02/2022 1122   AST 20 10/02/2022 1122  ALT 6 10/02/2022 1122   BILITOT 0.4 10/02/2022 1122       RADIOGRAPHIC STUDIES: VAS [place] LOWER EXTREMITY VENOUS (DVT)  Result Date: 10/16/2022  Lower Venous DVT Study Patient Name:  [facility] RITA Holmes  Date of Exam:   10/16/2022 Medical Rec #: [id number]               Accession #:    [id number] Date of Birth: 1950/10/11               Patient Gender: F Patient Age:   68 years Exam Location:  [facility] Procedure:      VAS [place] LOWER EXTREMITY VENOUS (DVT) Referring Phys: Tessa Lerner --------------------------------------------------------------------------------  Indications: Swelling.  Risk Factors: Cancer. Limitations: Poor ultrasound/tissue interface. Comparison Study: No prior studies. Performing Technologist: Chanda Busing RVT  Examination Guidelines: A complete evaluation includes B-mode imaging, spectral Doppler, color Doppler, and power Doppler as needed of all accessible portions of each vessel. Bilateral testing is considered an integral part  of a complete examination. Limited examinations for reoccurring indications may be performed as noted. The reflux portion of the exam is performed with the patient in reverse Trendelenburg.  +---------+---------------+---------+-----------+----------+--------------+ RIGHT    CompressibilityPhasicitySpontaneityPropertiesThrombus Aging +---------+---------------+---------+-----------+----------+--------------+ CFV      Full           Yes      Yes                                 +---------+---------------+---------+-----------+----------+--------------+ SFJ      Full                                                        +---------+---------------+---------+-----------+----------+--------------+ FV Prox  Full                                                        +---------+---------------+---------+-----------+----------+--------------+ FV Mid   Full                                                        +---------+---------------+---------+-----------+----------+--------------+ FV DistalFull                                                        +---------+---------------+---------+-----------+----------+--------------+ PFV      Full                                                        +---------+---------------+---------+-----------+----------+--------------+ POP      Full           Yes      Yes                                 +---------+---------------+---------+-----------+----------+--------------+  PTV      Full                                                        +---------+---------------+---------+-----------+----------+--------------+ PERO     Full                                                        +---------+---------------+---------+-----------+----------+--------------+   +---------+---------------+---------+-----------+----------+--------------+ LEFT     CompressibilityPhasicitySpontaneityPropertiesThrombus Aging  +---------+---------------+---------+-----------+----------+--------------+ CFV      Full           Yes      Yes                                 +---------+---------------+---------+-----------+----------+--------------+ SFJ      Full                                                        +---------+---------------+---------+-----------+----------+--------------+ FV Prox  Full                                                        +---------+---------------+---------+-----------+----------+--------------+ FV Mid   Full                                                        +---------+---------------+---------+-----------+----------+--------------+ FV DistalFull                                                        +---------+---------------+---------+-----------+----------+--------------+ PFV      Full                                                        +---------+---------------+---------+-----------+----------+--------------+ POP      Full           Yes      Yes                                 +---------+---------------+---------+-----------+----------+--------------+ PTV      Full                                                        +---------+---------------+---------+-----------+----------+--------------+  PERO     Full                                                        +---------+---------------+---------+-----------+----------+--------------+     Summary: RIGHT: - There is no evidence of deep vein thrombosis in the lower extremity.  - A cystic structure is found in the popliteal fossa.  LEFT: - There is no evidence of deep vein thrombosis in the lower extremity.  - No cystic structure found in the popliteal fossa.  *See table(s) above for measurements and observations. Electronically signed by Coral Else MD on 10/16/2022 at 9:18:20 PM.    Final      ASSESSMENT AND PLAN: This is a pleasant 72 years old African-American female diagnosed  with metastatic also lung cancer, adenocarcinoma initially diagnosed as stage IIb (T1b, N1, M0) non-small cell lung cancer, adenocarcinoma status post a right upper lobectomy with lymph node dissection on September 30, 2019 in [place].  She declined adjuvant systemic chemotherapy at that time. She was found to have evidence for disease metastasis in November 2023 presenting with multiple metastatic bone lesions. The patient had molecular studies that showed an actionable mutation with BRAF V600E mutation and PD-L1 expression of 35%. She underwent palliative radiotherapy to the T9 vertebral lesion under the care of Dr. Mitzi Hansen. The patient is currently undergoing first-line combination of systemic chemotherapy with carboplatin for AUC of 5, Alimta 500 Mg/M2 and Keytruda 200 Mg IV every 3 weeks.  Status post 9 cycles.  Starting from cycle #5 she will be on maintenance treatment with Alimta and Keytruda every 3 weeks.  Starting from cycle #5 she will be on single agent Keytruda only.  Alimta will be discontinued secondary to renal insufficiency. The patient continues to tolerate her treatment fairly well with no concerning adverse effect except for mild fatigue. I recommended for her to proceed with cycle #10 today as planned. I will see her back for follow-up visit in 3 weeks for evaluation with repeat CT scan of the chest, abdomen and pelvis for restaging of her disease. She was advised to call immediately if she has any other concerning symptoms in the interval. The patient voices understanding of current disease status and treatment options and is in agreement with the current care plan. The total time spent in the appointment was 20 minutes.  All questions were answered. The patient knows to call the clinic with any problems, questions or concerns. We can certainly see the patient much sooner if necessary.  Disclaimer: This note was dictated with voice recognition software. Similar sounding words can  inadvertently be transcribed and may not be corrected upon review.

## 2022-10-23 NOTE — Progress Notes (Signed)
Nutrition Assessment   Reason for Assessment: +MST   ASSESSMENT: 72 year old female with lung cancer metastatic to bone. She is receiving maintenance keytruda q21d. Patient is under the care of Dr. Arbutus Ped   Met with patient in infusion. She reports good appetite. Eating more liver to help with anemia. Patient enjoys cooking, however unable to stand for long periods of time secondary to pain. Patient recalls doing a lot of crockpot cooking. Patient is drinking 1-2 Boost Plus. She denies nausea, vomiting, diarrhea. Patient reports chronic constipation managed well with MOM. 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 States she did not eat for weeks. This has resolved. She does not want to gain this back as she has not weighed this since highschool.   Nutrition Focused Physical Exam: deferred    Medications: reviewed    Labs: Cr 1.15, albumin 3.2   Anthropometrics: Wt 158 lb 1.6 oz today increased   6/27 - 155 lb     NUTRITION DIAGNOSIS: Food and nutrition related knowledge deficit related to cancer as evidenced by no prior need for associated nutrition information    INTERVENTION:  Discussed importance of adequate calorie/protein energy intake to maintain strength/weights Encouraged soft moist high protein foods for ease of intake Suggested keeping a variety of heat/serve meals available  Encouraged high protein snacks in between meals  Continue drinking 2 Boost Plus/equivalent Handout on anemia provided    MONITORING, EVALUATION, GOAL: Pt will tolerate adequate calories and protein to minimize further wt loss    Next Visit: To be scheduled as needed with treatment

## 2022-10-28 ENCOUNTER — Telehealth: Payer: Self-pay | Admitting: Internal Medicine

## 2022-10-28 NOTE — Telephone Encounter (Signed)
Called patient regarding July/August appointments, patient is nitrified.

## 2022-11-05 ENCOUNTER — Other Ambulatory Visit: Payer: Self-pay | Admitting: Cardiology

## 2022-11-05 ENCOUNTER — Ambulatory Visit (HOSPITAL_COMMUNITY)
Admission: RE | Admit: 2022-11-05 | Discharge: 2022-11-05 | Disposition: A | Discharge status: Home / Self Care | Payer: Medicare HMO | Source: Ambulatory Visit | Attending: Cardiology | Admitting: Cardiology

## 2022-11-05 DIAGNOSIS — C3490 Malignant neoplasm of unspecified part of unspecified bronchus or lung: Secondary | ICD-10-CM

## 2022-11-05 DIAGNOSIS — I2510 Atherosclerotic heart disease of native coronary artery without angina pectoris: Secondary | ICD-10-CM | POA: Diagnosis present

## 2022-11-05 DIAGNOSIS — I3139 Other pericardial effusion (noninflammatory): Secondary | ICD-10-CM | POA: Diagnosis not present

## 2022-11-05 DIAGNOSIS — I2584 Coronary atherosclerosis due to calcified coronary lesion: Secondary | ICD-10-CM | POA: Diagnosis present

## 2022-11-05 DIAGNOSIS — Z7969 Long term (current) use of other immunomodulators and immunosuppressants: Secondary | ICD-10-CM | POA: Diagnosis not present

## 2022-11-05 DIAGNOSIS — I959 Hypotension, unspecified: Secondary | ICD-10-CM

## 2022-11-05 DIAGNOSIS — Z87891 Personal history of nicotine dependence: Secondary | ICD-10-CM | POA: Diagnosis not present

## 2022-11-05 DIAGNOSIS — M7989 Other specified soft tissue disorders: Secondary | ICD-10-CM

## 2022-11-05 DIAGNOSIS — I10 Essential (primary) hypertension: Secondary | ICD-10-CM | POA: Insufficient documentation

## 2022-11-07 LAB — ECHOCARDIOGRAM LIMITED

## 2022-11-08 ENCOUNTER — Other Ambulatory Visit: Payer: Self-pay | Admitting: Physician Assistant

## 2022-11-11 ENCOUNTER — Encounter (HOSPITAL_COMMUNITY): Payer: Self-pay

## 2022-11-11 ENCOUNTER — Ambulatory Visit (HOSPITAL_COMMUNITY)
Admission: RE | Admit: 2022-11-11 | Discharge: 2022-11-11 | Disposition: A | Discharge status: Home / Self Care | Payer: Medicare HMO | Source: Ambulatory Visit | Attending: Internal Medicine | Admitting: Internal Medicine

## 2022-11-11 DIAGNOSIS — C3490 Malignant neoplasm of unspecified part of unspecified bronchus or lung: Secondary | ICD-10-CM | POA: Insufficient documentation

## 2022-11-11 MED ORDER — IOHEXOL 300 MG/ML  SOLN
100.0000 mL | Freq: Once | INTRAMUSCULAR | Status: AC | PRN
  Administered 2022-11-11: 100 mL via INTRAVENOUS

## 2022-11-13 ENCOUNTER — Inpatient Hospital Stay (HOSPITAL_BASED_OUTPATIENT_CLINIC_OR_DEPARTMENT_OTHER): Payer: Medicare HMO | Admitting: Internal Medicine

## 2022-11-13 ENCOUNTER — Inpatient Hospital Stay: Payer: Medicare HMO

## 2022-11-13 ENCOUNTER — Other Ambulatory Visit: Payer: Self-pay | Admitting: *Deleted

## 2022-11-13 ENCOUNTER — Other Ambulatory Visit: Payer: Self-pay

## 2022-11-13 DIAGNOSIS — Z5112 Encounter for antineoplastic immunotherapy: Secondary | ICD-10-CM | POA: Diagnosis not present

## 2022-11-13 DIAGNOSIS — C7951 Secondary malignant neoplasm of bone: Secondary | ICD-10-CM

## 2022-11-13 LAB — CBC WITH DIFFERENTIAL (CANCER CENTER ONLY)
Abs Immature Granulocytes: 0.02 10*3/uL (ref 0.00–0.07)
Basophils Absolute: 0.1 10*3/uL (ref 0.0–0.1)
Basophils Relative: 1 %
Eosinophils Absolute: 1 10*3/uL — ABNORMAL HIGH (ref 0.0–0.5)
Eosinophils Relative: 14 %
HCT: 32.7 % — ABNORMAL LOW (ref 36.0–46.0)
Hemoglobin: 10.5 g/dL — ABNORMAL LOW (ref 12.0–15.0)
Immature Granulocytes: 0 %
Lymphocytes Relative: 27 %
Lymphs Abs: 1.9 10*3/uL (ref 0.7–4.0)
MCH: 30.8 pg (ref 26.0–34.0)
MCHC: 32.1 g/dL (ref 30.0–36.0)
MCV: 95.9 fL (ref 80.0–100.0)
Monocytes Absolute: 0.8 10*3/uL (ref 0.1–1.0)
Monocytes Relative: 11 %
Neutro Abs: 3.4 10*3/uL (ref 1.7–7.7)
Neutrophils Relative %: 47 %
Platelet Count: 204 10*3/uL (ref 150–400)
RBC: 3.41 MIL/uL — ABNORMAL LOW (ref 3.87–5.11)
RDW: 14 % (ref 11.5–15.5)
WBC Count: 7.1 10*3/uL (ref 4.0–10.5)
nRBC: 0 % (ref 0.0–0.2)

## 2022-11-13 LAB — CMP (CANCER CENTER ONLY)
ALT: 8 U/L (ref 0–44)
AST: 21 U/L (ref 15–41)
Albumin: 3.4 g/dL — ABNORMAL LOW (ref 3.5–5.0)
Alkaline Phosphatase: 70 U/L (ref 38–126)
Anion gap: 8 (ref 5–15)
BUN: 23 mg/dL (ref 8–23)
CO2: 26 mmol/L (ref 22–32)
Calcium: 9.4 mg/dL (ref 8.9–10.3)
Chloride: 106 mmol/L (ref 98–111)
Creatinine: 1.65 mg/dL — ABNORMAL HIGH (ref 0.44–1.00)
GFR, Estimated: 32 mL/min — ABNORMAL LOW (ref >60)
Glucose, Bld: 89 mg/dL (ref 70–99)
Potassium: 3.9 mmol/L (ref 3.5–5.1)
Sodium: 140 mmol/L (ref 135–145)
Total Bilirubin: 0.6 mg/dL (ref 0.3–1.2)
Total Protein: 6.9 g/dL (ref 6.5–8.1)

## 2022-11-13 MED ORDER — SODIUM CHLORIDE 0.9 % IV SOLN
200.0000 mg | Freq: Once | INTRAVENOUS | Status: AC
  Administered 2022-11-13: 200 mg via INTRAVENOUS
  Filled 2022-11-13: qty 200

## 2022-11-13 MED ORDER — SODIUM CHLORIDE 0.9 % IV SOLN: Freq: Once | INTRAVENOUS | Status: AC

## 2022-11-13 NOTE — Progress Notes (Signed)
[facility] [phone]   [phone]  OFFICE PROGRESS NOTE  [name], MD [address] [place] [address]  DIAGNOSIS: Metastatic non-small cell lung cancer, adenocarcinoma that was initially diagnosed as stage IIb (T1b, N1, M0) non-small cell lung cancer, adenocarcinoma presented with right upper lobe lung nodule  Detected Alteration(s) / Biomarker(s) Associated FDA-approved therapies Clinical Trial Availability % cfDNA or Amplification BRAF V600E approved by FDA Dabrafenib+trametinib, Encorafenib+binimetinib approved in other indication Cobimetinib, Dabrafenib, Trametinib, Vemurafenib, Vemurafenib+cobimetinib Yes 8.3%  BRCA1 R252S None (VUS) None (VUS) 0.1%   SMAD4 D411fs None None 7.2%  PD-L1 expression 35%  PRIOR THERAPY:  1) Status post right upper lobectomy with lymph node dissection on September 30, 2019 in [place].  She declines adjuvant systemic chemotherapy. 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 Mitzi Hansen.  CURRENT THERAPY: Systemic chemotherapy with carboplatin for AUC of 5, Alimta 500 Mg/M2 and Keytruda 200 Mg IV every 3 weeks.  First dose April 16, 2022.  Status post 10 cycles.  Starting cycle #5 she will be on maintenance treatment with Keytruda every 3 weeks.  INTERVAL HISTORY: Brandi Holmes 72 y.o. female returns to the clinic today for follow-up visit.  The patient is feeling fine today with no concerning complaints except for occasional back pain.  She denied having any current chest pain, shortness of breath except with exertion with no cough or hemoptysis.  She has no nausea, vomiting, diarrhea or constipation.  She has no headache or visual changes.  She was seen by her cardiologist recently and had 2D echo that showed pericardial effusion but was not significant enough for therapeutic  drainage.  The patient continues to tolerate her maintenance treatment fairly well.  She is here for evaluation before starting cycle #11 of her treatment.  MEDICAL HISTORY: Past Medical History:  Diagnosis Date   Anemia    as a child only   Coronary artery calcification of native artery    Hyperlipidemia    Hypertension    Liver spots    " per pt"   Lung nodule    Wears glasses    Wears partial dentures     ALLERGIES:  is allergic to crab [shellfish allergy] and morphine and codeine.  MEDICATIONS:  Current Outpatient Medications  Medication Sig Dispense Refill   aspirin EC 81 MG tablet Take 81 mg by mouth at bedtime. Swallow whole.     atorvastatin (LIPITOR) 20 MG tablet Take 20 mg by mouth at bedtime.     folic acid (FOLVITE) 1 MG tablet TAKE ONE TABLET BY MOUTH EVERY DAY 30 tablet 0   lactose free nutrition (BOOST) LIQD Take 237 mLs by mouth 2 (two) times daily between meals.     lidocaine (LIDODERM) 5 % Place 1 patch onto the skin daily. Remove & Discard patch within 12 hours or as directed by MD 30 patch 0   lisinopril (ZESTRIL) 10 MG tablet Take 1 tablet (10 mg total) by mouth every morning. 90 tablet 0   magnesium citrate SOLN Take 1 Bottle by mouth once a week.     OVER THE COUNTER MEDICATION "Prune X" -2-3 x week     pregabalin (LYRICA) 150 MG capsule Take 150 mg by mouth 2 (two) times daily.     prochlorperazine (COMPAZINE) 10 MG tablet Take 1 tablet (10 mg total) by mouth  every 6 (six) hours as needed for nausea or vomiting. 30 tablet 0   tiZANidine (ZANAFLEX) 2 MG tablet Take 2 mg by mouth at bedtime.     traMADol HCl 100 MG TABS Take 1 tablet by mouth in the morning, at noon, and at bedtime.     No current facility-administered medications for this visit.    SURGICAL HISTORY:  Past Surgical History:  Procedure Laterality Date   ABDOMINAL HYSTERECTOMY  1974   heavy bleeding   ABDOMINAL HYSTERECTOMY     BRONCHIAL BRUSHINGS  08/13/2019   Procedure: BRONCHIAL  BRUSHINGS;  Surgeon: Josephine Igo, DO;  Location: [facility];  Service: Pulmonary;;   BRONCHIAL WASHINGS  08/13/2019   Procedure: BRONCHIAL WASHINGS;  Surgeon: Josephine Igo, DO;  Location: [facility];  Service: Pulmonary;;   COLONOSCOPY W/ BIOPSIES AND POLYPECTOMY     DILATION AND CURETTAGE OF UTERUS     FINE NEEDLE ASPIRATION  08/13/2019   Procedure: FINE NEEDLE ASPIRATION (FNA) LINEAR;  Surgeon: Josephine Igo, DO;  Location: [facility];  Service: Pulmonary;;   FOOT SURGERY     bilateral bunions and hammer toes   IR RADIOLOGIST EVAL & MGMT  05/20/2022   LUNG BIOPSY  08/13/2019   Procedure: LUNG BIOPSY;  Surgeon: Josephine Igo, DO;  Location: [facility];  Service: Pulmonary;;   MULTIPLE TOOTH EXTRACTIONS     VIDEO BRONCHOSCOPY WITH ENDOBRONCHIAL NAVIGATION Right 08/13/2019   Procedure: VIDEO BRONCHOSCOPY WITH ENDOBRONCHIAL NAVIGATION;  Surgeon: Josephine Igo, DO;  Location: [facility];  Service: Pulmonary;  Laterality: Right;   VIDEO BRONCHOSCOPY WITH ENDOBRONCHIAL ULTRASOUND Right 08/13/2019   Procedure: VIDEO BRONCHOSCOPY WITH ENDOBRONCHIAL ULTRASOUND;  Surgeon: Josephine Igo, DO;  Location: [facility];  Service: Pulmonary;  Laterality: Right;    REVIEW OF SYSTEMS:  Constitutional: positive for fatigue Eyes: negative Ears, nose, mouth, throat, and face: negative Respiratory: negative Cardiovascular: negative Gastrointestinal: negative Genitourinary:negative Integument/breast: negative Hematologic/lymphatic: negative Musculoskeletal:positive for back pain Neurological: negative Behavioral/Psych: negative Endocrine: negative Allergic/Immunologic: negative   PHYSICAL EXAMINATION: General appearance: alert, cooperative, fatigued, and no distress Head: Normocephalic, without obvious abnormality, atraumatic Neck: no adenopathy, no JVD, supple, symmetrical, trachea midline, and thyroid not enlarged, symmetric, no tenderness/mass/nodules Lymph nodes: Cervical,  supraclavicular, and axillary nodes normal. Resp: clear to auscultation bilaterally Back: symmetric, no curvature. ROM normal. No CVA tenderness. Cardio: regular rate and rhythm, S1, S2 normal, no murmur, click, rub or gallop GI: soft, non-tender; bowel sounds normal; no masses,  no organomegaly Extremities: extremities normal, atraumatic, no cyanosis or edema Neurologic: Alert and oriented X 3, normal strength and tone. Normal symmetric reflexes. Normal coordination and gait  ECOG PERFORMANCE STATUS: 1 - Symptomatic but completely ambulatory  Blood pressure 134/73, pulse 73, temperature 97.7 F (36.5 C), temperature source Oral, resp. rate 17, height 5\' 6"  (1.676 m), weight 148 lb 6.4 oz (67.3 kg), SpO2 100%.    LABORATORY DATA: Lab Results  Component Value Date   WBC 7.1 11/13/2022   HGB 10.5 (L) 11/13/2022   HCT 32.7 (L) 11/13/2022   MCV 95.9 11/13/2022   PLT 204 11/13/2022      Chemistry      Component Value Date/Time   NA 140 11/13/2022 1005   NA 142 07/22/2019 1522   K 3.9 11/13/2022 1005   CL 106 11/13/2022 1005   CO2 26 11/13/2022 1005   BUN 23 11/13/2022 1005   BUN 13 07/22/2019 1522   CREATININE 1.65 (H) 11/13/2022 1005   CREATININE 0.91 09/16/2011  1000      Component Value Date/Time   CALCIUM 9.4 11/13/2022 1005   ALKPHOS 70 11/13/2022 1005   AST 21 11/13/2022 1005   ALT 8 11/13/2022 1005   BILITOT 0.6 11/13/2022 1005       RADIOGRAPHIC STUDIES: CT Chest W Contrast  Result Date: 11/13/2022 CLINICAL DATA:  Non-small-cell lung cancer. Restaging. * Tracking Code: BO * EXAM: CT CHEST, ABDOMEN, AND PELVIS WITH CONTRAST TECHNIQUE: Multidetector CT imaging of the chest, abdomen and pelvis was performed following the standard protocol during bolus administration of intravenous contrast. RADIATION DOSE REDUCTION: This exam was performed according to the departmental dose-optimization program which includes automated exposure control, adjustment of the mA and/or kV  according to patient size and/or use of iterative reconstruction technique. CONTRAST:  OMNIPAQUE IOHEXOL 300 MG/ML  SOLN COMPARISON:  Abdomen and pelvis CT 09/06/2022. Chest abdomen pelvis CT 06/17/2022. FINDINGS: CT CHEST FINDINGS Cardiovascular: Heart size upper normal. Small to moderate pericardial effusion is minimally progressive in the interval. Coronary artery calcification is evident. Mild atherosclerotic calcification is noted in the wall of the thoracic aorta. Mediastinum/Nodes: No mediastinal lymphadenopathy. There is no hilar lymphadenopathy. The esophagus has normal imaging features. There is no axillary lymphadenopathy. Lungs/Pleura: Centrilobular emphsyema noted. Volume loss right hemithorax is stable. Scarring in the medial and posterior right upper lung is similar to prior with retro hilar and posterior right lung scarring also stable in the interval. Subpleural reticulation bilaterally suggests underlying fibrotic lung disease. No new suspicious pulmonary nodule or mass. Small right pleural effusion is similar to prior. Musculoskeletal: Scattered sclerotic bone lesions are compatible with metastatic disease. Lesion involving the T9 vertebral body has become more sclerotic in the interval suggesting healing. Compression deformity at T8 through T11 is stable. CT ABDOMEN PELVIS FINDINGS Hepatobiliary: Scattered tiny hypodensities in the liver parenchyma are too small to characterize but are statistically most likely benign. No followup imaging is recommended. There is no evidence for gallstones, gallbladder wall thickening, or pericholecystic fluid. No intrahepatic or extrahepatic biliary dilation. Pancreas: No focal mass lesion. No dilatation of the main duct. No intraparenchymal cyst. No peripancreatic edema. Spleen: No splenomegaly. No suspicious focal mass lesion. Adrenals/Urinary Tract: No adrenal nodule or mass. 18 x 14 mm ill-defined lesion lower pole right kidney with associated  dystrophic calcification was measured previously at 20 x 19 mm. No evidence for hydroureter. The urinary bladder appears normal for the degree of distention. Stomach/Bowel: Stomach is unremarkable. No gastric wall thickening. No evidence of outlet obstruction. Duodenum is normally positioned as is the ligament of Treitz. No small bowel wall thickening. No small bowel dilatation. No gross colonic mass. No colonic wall thickening. Vascular/Lymphatic: There is moderate atherosclerotic calcification of the abdominal aorta without aneurysm. There is no gastrohepatic or hepatoduodenal ligament lymphadenopathy. No retroperitoneal or mesenteric lymphadenopathy. No pelvic sidewall lymphadenopathy. Reproductive: Hysterectomy.  There is no adnexal mass. Other: No intraperitoneal free fluid. Musculoskeletal: Multiple sclerotic bone lesions of the lumbar spine and bony pelvis are consistent with metastatic disease, similar to prior. IMPRESSION: 1. No findings to suggest new or progressive metastatic disease. 2. Stable small right pleural effusion. 3. Small to moderate pericardial effusion is minimally progressive in the interval. 4. Stable ill-defined lesion lower pole right kidney with associated dystrophic calcification. W renal rist cell carcinoma a concern. 5. Stable sclerotic bone lesions compatible with metastatic disease. Lesion involving the T9 vertebral body has become more sclerotic in the interval suggesting healing. 6. Aortic Atherosclerosis (ICD10-I70.0) and Emphysema (ICD10-J43.9). Electronically Signed  By: Kennith Center M.D.   On: 11/13/2022 10:09   CT Abdomen Pelvis W Contrast  Result Date: 11/13/2022 CLINICAL DATA:  Non-small-cell lung cancer. Restaging. * Tracking Code: BO * EXAM: CT CHEST, ABDOMEN, AND PELVIS WITH CONTRAST TECHNIQUE: Multidetector CT imaging of the chest, abdomen and pelvis was performed following the standard protocol during bolus administration of intravenous contrast. RADIATION DOSE  REDUCTION: This exam was performed according to the departmental dose-optimization program which includes automated exposure control, adjustment of the mA and/or kV according to patient size and/or use of iterative reconstruction technique. CONTRAST:  OMNIPAQUE IOHEXOL 300 MG/ML  SOLN COMPARISON:  Abdomen and pelvis CT 09/06/2022. Chest abdomen pelvis CT 06/17/2022. FINDINGS: CT CHEST FINDINGS Cardiovascular: Heart size upper normal. Small to moderate pericardial effusion is minimally progressive in the interval. Coronary artery calcification is evident. Mild atherosclerotic calcification is noted in the wall of the thoracic aorta. Mediastinum/Nodes: No mediastinal lymphadenopathy. There is no hilar lymphadenopathy. The esophagus has normal imaging features. There is no axillary lymphadenopathy. Lungs/Pleura: Centrilobular emphsyema noted. Volume loss right hemithorax is stable. Scarring in the medial and posterior right upper lung is similar to prior with retro hilar and posterior right lung scarring also stable in the interval. Subpleural reticulation bilaterally suggests underlying fibrotic lung disease. No new suspicious pulmonary nodule or mass. Small right pleural effusion is similar to prior. Musculoskeletal: Scattered sclerotic bone lesions are compatible with metastatic disease. Lesion involving the T9 vertebral body has become more sclerotic in the interval suggesting healing. Compression deformity at T8 through T11 is stable. CT ABDOMEN PELVIS FINDINGS Hepatobiliary: Scattered tiny hypodensities in the liver parenchyma are too small to characterize but are statistically most likely benign. No followup imaging is recommended. There is no evidence for gallstones, gallbladder wall thickening, or pericholecystic fluid. No intrahepatic or extrahepatic biliary dilation. Pancreas: No focal mass lesion. No dilatation of the main duct. No intraparenchymal cyst. No peripancreatic edema. Spleen: No  splenomegaly. No suspicious focal mass lesion. Adrenals/Urinary Tract: No adrenal nodule or mass. 18 x 14 mm ill-defined lesion lower pole right kidney with associated dystrophic calcification was measured previously at 20 x 19 mm. No evidence for hydroureter. The urinary bladder appears normal for the degree of distention. Stomach/Bowel: Stomach is unremarkable. No gastric wall thickening. No evidence of outlet obstruction. Duodenum is normally positioned as is the ligament of Treitz. No small bowel wall thickening. No small bowel dilatation. No gross colonic mass. No colonic wall thickening. Vascular/Lymphatic: There is moderate atherosclerotic calcification of the abdominal aorta without aneurysm. There is no gastrohepatic or hepatoduodenal ligament lymphadenopathy. No retroperitoneal or mesenteric lymphadenopathy. No pelvic sidewall lymphadenopathy. Reproductive: Hysterectomy.  There is no adnexal mass. Other: No intraperitoneal free fluid. Musculoskeletal: Multiple sclerotic bone lesions of the lumbar spine and bony pelvis are consistent with metastatic disease, similar to prior. IMPRESSION: 1. No findings to suggest new or progressive metastatic disease. 2. Stable small right pleural effusion. 3. Small to moderate pericardial effusion is minimally progressive in the interval. 4. Stable ill-defined lesion lower pole right kidney with associated dystrophic calcification. W renal rist cell carcinoma a concern. 5. Stable sclerotic bone lesions compatible with metastatic disease. Lesion involving the T9 vertebral body has become more sclerotic in the interval suggesting healing. 6. Aortic Atherosclerosis (ICD10-I70.0) and Emphysema (ICD10-J43.9). Electronically Signed   By: Kennith Center M.D.   On: 11/13/2022 10:09   ECHOCARDIOGRAM LIMITED  Result Date: 11/07/2022    ECHOCARDIOGRAM REPORT   Patient Name:   Brandi Holmes  Date of Exam: 11/05/2022 Medical Rec #:  [id number]              Height:       66.0  in Accession #:    [id number]             Weight:       158.1 lb Date of Birth:  [birth year]              BSA:          1.810 m Patient Age:    72 years               BP:           106/60 mmHg Patient Gender: F                      HR:           83 bpm. Exam Location:  Outpatient Procedure: Cardiac Doppler and Limited Echo Indications:    Lung Cancer, Chemotherapy  History:        Patient has prior history of Echocardiogram examinations, most                 recent 07/27/2019. Lung Cancer; Risk Factors:Hypertension and                 Former Smoker.  Sonographer:    Milbert Coulter Referring Phys: [id number] SUNIT TOLIA IMPRESSIONS  1. LIMITED ECHO.  2. Left ventricular ejection fraction, by estimation, is 60 to 65%. The left ventricle has normal function. The left ventricle has no regional wall motion abnormalities. Left ventricular diastolic function could not be evaluated.  3. Right ventricular systolic function visually normal. The right ventricular size is visually normal. Mildly increased right ventricular wall thickness.  4. Left atrial size was grossly normal.  5. Right atrial size was grossly normal.  6. Moderate pericardial effusion. The pericardial effusion is circumferential. There is no evidence of cardiac tamponade.  7. The mitral valve is grossly normal.  8. The aortic valve is grossly normal.  9. The inferior vena cava is dilated in size with >50% respiratory variability, suggesting right atrial pressure of 8 mmHg. Comparison(s): Prior study 07/27/2019: LVEF 55%, Grade 1 diastolic dysfunction, mild LVH, mild TR, RVSP . FINDINGS  Left Ventricle: Left ventricular ejection fraction, by estimation, is 60 to 65%. The left ventricle has normal function. The left ventricle has no regional wall motion abnormalities. Left ventricular diastolic function could not be evaluated. Right Ventricle: The right ventricular size is visually normal. Mildly increased right ventricular wall thickness. Right ventricular  systolic function visually normal. Left Atrium: Left atrial size was grossly normal. Right Atrium: Right atrial size was grossly normal. Pericardium: A moderately sized pericardial effusion is present. The pericardial effusion is circumferential. There is no evidence of cardiac tamponade. Mitral Valve: The mitral valve is grossly normal. Tricuspid Valve: The tricuspid valve is grossly normal. Aortic Valve: The aortic valve is grossly normal. Pulmonic Valve: The pulmonic valve was not assessed. Aorta: The ascending aorta was not well visualized and the aortic root was not well visualized. Venous: The inferior vena cava is dilated in size with greater than 50% respiratory variability, suggesting right atrial pressure of 8 mmHg. IAS/Shunts: The interatrial septum was not assessed. Sunit Tolia DO Electronically signed by Tessa Lerner DO Signature Date/Time: 11/07/2022/6:10:07 AM    Final    VAS [place] LOWER EXTREMITY VENOUS (DVT)  Result Date: 10/16/2022  Lower Venous DVT  Study Patient Name:  [facility] RITA Holmes  Date of Exam:   10/16/2022 Medical Rec #: [id number]               Accession #:    [id number] Date of Birth: Mar 27, 1951               Patient Gender: F Patient Age:   15 years Exam Location:  [facility] Procedure:      VAS [place] LOWER EXTREMITY VENOUS (DVT) Referring Phys: Tessa Lerner --------------------------------------------------------------------------------  Indications: Swelling.  Risk Factors: Cancer. Limitations: Poor ultrasound/tissue interface. Comparison Study: No prior studies. Performing Technologist: Chanda Busing RVT  Examination Guidelines: A complete evaluation includes B-mode imaging, spectral Doppler, color Doppler, and power Doppler as needed of all accessible portions of each vessel. Bilateral testing is considered an integral part of a complete examination. Limited examinations for reoccurring indications may be performed as noted. The reflux portion of the exam is performed with  the patient in reverse Trendelenburg.  +---------+---------------+---------+-----------+----------+--------------+ RIGHT    CompressibilityPhasicitySpontaneityPropertiesThrombus Aging +---------+---------------+---------+-----------+----------+--------------+ CFV      Full           Yes      Yes                                 +---------+---------------+---------+-----------+----------+--------------+ SFJ      Full                                                        +---------+---------------+---------+-----------+----------+--------------+ FV Prox  Full                                                        +---------+---------------+---------+-----------+----------+--------------+ FV Mid   Full                                                        +---------+---------------+---------+-----------+----------+--------------+ FV DistalFull                                                        +---------+---------------+---------+-----------+----------+--------------+ PFV      Full                                                        +---------+---------------+---------+-----------+----------+--------------+ POP      Full           Yes      Yes                                 +---------+---------------+---------+-----------+----------+--------------+ PTV  Full                                                        +---------+---------------+---------+-----------+----------+--------------+ PERO     Full                                                        +---------+---------------+---------+-----------+----------+--------------+   +---------+---------------+---------+-----------+----------+--------------+ LEFT     CompressibilityPhasicitySpontaneityPropertiesThrombus Aging +---------+---------------+---------+-----------+----------+--------------+ CFV      Full           Yes      Yes                                  +---------+---------------+---------+-----------+----------+--------------+ SFJ      Full                                                        +---------+---------------+---------+-----------+----------+--------------+ FV Prox  Full                                                        +---------+---------------+---------+-----------+----------+--------------+ FV Mid   Full                                                        +---------+---------------+---------+-----------+----------+--------------+ FV DistalFull                                                        +---------+---------------+---------+-----------+----------+--------------+ PFV      Full                                                        +---------+---------------+---------+-----------+----------+--------------+ POP      Full           Yes      Yes                                 +---------+---------------+---------+-----------+----------+--------------+ PTV      Full                                                        +---------+---------------+---------+-----------+----------+--------------+  PERO     Full                                                        +---------+---------------+---------+-----------+----------+--------------+     Summary: RIGHT: - There is no evidence of deep vein thrombosis in the lower extremity.  - A cystic structure is found in the popliteal fossa.  LEFT: - There is no evidence of deep vein thrombosis in the lower extremity.  - No cystic structure found in the popliteal fossa.  *See table(s) above for measurements and observations. Electronically signed by Coral Else MD on 10/16/2022 at 9:18:20 PM.    Final      ASSESSMENT AND PLAN: This is a pleasant 72 years old African-American female diagnosed with metastatic also lung cancer, adenocarcinoma initially diagnosed as stage IIb (T1b, N1, M0) non-small cell lung cancer, adenocarcinoma status post  a right upper lobectomy with lymph node dissection on September 30, 2019 in [place].  She declined adjuvant systemic chemotherapy at that time. She was found to have evidence for disease metastasis in November 2023 presenting with multiple metastatic bone lesions. The patient had molecular studies that showed an actionable mutation with BRAF V600E mutation and PD-L1 expression of 35%. She underwent palliative radiotherapy to the T9 vertebral lesion under the care of Dr. Mitzi Hansen. The patient is currently undergoing first-line combination of systemic chemotherapy with carboplatin for AUC of 5, Alimta 500 Mg/M2 and Keytruda 200 Mg IV every 3 weeks.  Status post 10 cycles.  Starting from cycle #5 she will be on maintenance treatment with Alimta and Keytruda every 3 weeks.  Starting from cycle #5 she will be on single agent Keytruda only.  Alimta will be discontinued secondary to renal insufficiency. Patient has been tolerating this treatment well with no concerning adverse effects. She had repeat CT scan of the chest, abdomen and pelvis performed recently.  I personally and independently reviewed the scan and discussed the results with the patient today. 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 I recommended for the patient to continue her maintenance treatment with single agent Keytruda and she will proceed with cycle #11 today. I will see her back for follow-up visit in 3 weeks for evaluation before the next cycle of her treatment. She was advised to call immediately if she has any other concerning symptoms in the interval. The patient voices understanding of current disease status and treatment options and is in agreement with the current care plan. The total time spent in the appointment was 30 minutes.  All questions were answered. The patient knows to call the clinic with any problems, questions or concerns. We can certainly see the  patient much sooner if necessary.  Disclaimer: This note was dictated with voice recognition software. Similar sounding words can inadvertently be transcribed and may not be corrected upon review.

## 2022-11-13 NOTE — Patient Instructions (Signed)

## 2022-11-13 NOTE — Progress Notes (Signed)
Ok to treat with serum creatinine of 1.65 today per Dr. Arbutus Ped.

## 2022-11-17 ENCOUNTER — Other Ambulatory Visit: Payer: Self-pay | Admitting: Cardiology

## 2022-11-17 DIAGNOSIS — I3139 Other pericardial effusion (noninflammatory): Secondary | ICD-10-CM

## 2022-11-17 DIAGNOSIS — C3490 Malignant neoplasm of unspecified part of unspecified bronchus or lung: Secondary | ICD-10-CM | POA: Insufficient documentation

## 2022-11-18 NOTE — Progress Notes (Signed)
No answer left a vm

## 2022-11-19 NOTE — Progress Notes (Signed)
Called and spoke with patient regarding her echocardiogram results.

## 2022-12-03 ENCOUNTER — Other Ambulatory Visit: Payer: Self-pay | Admitting: *Deleted

## 2022-12-03 DIAGNOSIS — M7989 Other specified soft tissue disorders: Secondary | ICD-10-CM

## 2022-12-04 ENCOUNTER — Inpatient Hospital Stay (HOSPITAL_BASED_OUTPATIENT_CLINIC_OR_DEPARTMENT_OTHER): Payer: Medicare HMO | Admitting: Internal Medicine

## 2022-12-04 ENCOUNTER — Inpatient Hospital Stay: Payer: Medicare HMO

## 2022-12-04 ENCOUNTER — Inpatient Hospital Stay: Payer: Medicare HMO | Attending: Physician Assistant

## 2022-12-04 VITALS — BP 103/59 | HR 82 | Temp 98.0°F | Resp 16 | Ht 66.0 in | Wt 145.2 lb

## 2022-12-04 DIAGNOSIS — C7951 Secondary malignant neoplasm of bone: Secondary | ICD-10-CM | POA: Insufficient documentation

## 2022-12-04 DIAGNOSIS — Z79620 Long term (current) use of immunosuppressive biologic: Secondary | ICD-10-CM | POA: Insufficient documentation

## 2022-12-04 DIAGNOSIS — C3411 Malignant neoplasm of upper lobe, right bronchus or lung: Secondary | ICD-10-CM | POA: Diagnosis not present

## 2022-12-04 DIAGNOSIS — Z5112 Encounter for antineoplastic immunotherapy: Secondary | ICD-10-CM | POA: Diagnosis present

## 2022-12-04 LAB — CBC WITH DIFFERENTIAL (CANCER CENTER ONLY)
Abs Immature Granulocytes: 0.01 10*3/uL (ref 0.00–0.07)
Basophils Absolute: 0.1 10*3/uL (ref 0.0–0.1)
Basophils Relative: 1 %
Eosinophils Absolute: 0.8 10*3/uL — ABNORMAL HIGH (ref 0.0–0.5)
Eosinophils Relative: 11 %
HCT: 35.8 % — ABNORMAL LOW (ref 36.0–46.0)
Hemoglobin: 11.4 g/dL — ABNORMAL LOW (ref 12.0–15.0)
Immature Granulocytes: 0 %
Lymphocytes Relative: 35 %
Lymphs Abs: 2.5 10*3/uL (ref 0.7–4.0)
MCH: 29.8 pg (ref 26.0–34.0)
MCHC: 31.8 g/dL (ref 30.0–36.0)
MCV: 93.7 fL (ref 80.0–100.0)
Monocytes Absolute: 0.7 10*3/uL (ref 0.1–1.0)
Monocytes Relative: 9 %
Neutro Abs: 3.1 10*3/uL (ref 1.7–7.7)
Neutrophils Relative %: 44 %
Platelet Count: 196 10*3/uL (ref 150–400)
RBC: 3.82 MIL/uL — ABNORMAL LOW (ref 3.87–5.11)
RDW: 13.6 % (ref 11.5–15.5)
WBC Count: 7.2 10*3/uL (ref 4.0–10.5)
nRBC: 0 % (ref 0.0–0.2)

## 2022-12-04 LAB — CMP (CANCER CENTER ONLY)
ALT: 10 U/L (ref 0–44)
AST: 21 U/L (ref 15–41)
Albumin: 3.4 g/dL — ABNORMAL LOW (ref 3.5–5.0)
Alkaline Phosphatase: 69 U/L (ref 38–126)
Anion gap: 7 (ref 5–15)
BUN: 21 mg/dL (ref 8–23)
CO2: 26 mmol/L (ref 22–32)
Calcium: 9.4 mg/dL (ref 8.9–10.3)
Chloride: 107 mmol/L (ref 98–111)
Creatinine: 1.36 mg/dL — ABNORMAL HIGH (ref 0.44–1.00)
GFR, Estimated: 41 mL/min — ABNORMAL LOW (ref >60)
Glucose, Bld: 92 mg/dL (ref 70–99)
Potassium: 3.9 mmol/L (ref 3.5–5.1)
Sodium: 140 mmol/L (ref 135–145)
Total Bilirubin: 0.6 mg/dL (ref 0.3–1.2)
Total Protein: 7 g/dL (ref 6.5–8.1)

## 2022-12-04 LAB — TSH: TSH: 0.521 u[IU]/mL (ref 0.350–4.500)

## 2022-12-04 MED ORDER — SODIUM CHLORIDE 0.9 % IV SOLN
200.0000 mg | Freq: Once | INTRAVENOUS | Status: AC
  Administered 2022-12-04: 200 mg via INTRAVENOUS
  Filled 2022-12-04: qty 200

## 2022-12-04 MED ORDER — SODIUM CHLORIDE 0.9 % IV SOLN: Freq: Once | INTRAVENOUS | Status: AC

## 2022-12-04 NOTE — Patient Instructions (Signed)
[facility]  Discharge Instructions: Thank you for choosing [facility] to provide your oncology and hematology care.   If you have a lab appointment with the Cancer Center, please go directly to the Cancer Center and check in at the registration area.   Wear comfortable clothing and clothing appropriate for easy access to any Portacath or PICC line.   We strive to give you quality time with your provider. You may need to reschedule your appointment if you arrive late (15 or more minutes).  Arriving late affects you and other patients whose appointments are after yours.  Also, if you miss three or more appointments without notifying the office, you may be dismissed from the clinic at the provider's discretion.      For prescription refill requests, have your pharmacy contact our office and allow 72 hours for refills to be completed.    Today you received the following chemotherapy and/or immunotherapy agents keytruda      To help prevent nausea and vomiting after your treatment, we encourage you to take your nausea medication as directed.  BELOW ARE SYMPTOMS THAT SHOULD BE REPORTED IMMEDIATELY: *FEVER GREATER THAN 100.4 F (38 C) OR HIGHER *CHILLS OR SWEATING *NAUSEA AND VOMITING THAT IS NOT CONTROLLED WITH YOUR NAUSEA MEDICATION *UNUSUAL SHORTNESS OF BREATH *UNUSUAL BRUISING OR BLEEDING *URINARY PROBLEMS (pain or burning when urinating, or frequent urination) *BOWEL PROBLEMS (unusual diarrhea, constipation, pain near the anus) TENDERNESS IN MOUTH AND THROAT WITH OR WITHOUT PRESENCE OF ULCERS (sore throat, sores in mouth, or a toothache) UNUSUAL RASH, SWELLING OR PAIN  UNUSUAL VAGINAL DISCHARGE OR ITCHING   Items with * indicate a potential emergency and should be followed up as soon as possible or go to the Emergency Department if any problems should occur.  Please show the CHEMOTHERAPY ALERT CARD or IMMUNOTHERAPY ALERT CARD at  check-in to the Emergency Department and triage nurse.  Should you have questions after your visit or need to cancel or reschedule your appointment, please contact [facility]  Dept: [phone]  and follow the prompts.  Office hours are 8:00 a.m. to 4:30 p.m. Monday - Friday. Please note that voicemails left after 4:00 p.m. may not be returned until the following business day.  We are closed weekends and major holidays. You have access to a nurse at all times for urgent questions. Please call the main number to the clinic Dept: [phone] and follow the prompts.   For any non-urgent questions, you may also contact your provider using MyChart. We now offer e-Visits for anyone 18 and older to request care online for non-urgent symptoms. For details visit [email or web address].   Also download the MyChart app! Go to the app store, search "MyChart", open the app, select , and log in with your MyChart username and password.   

## 2022-12-04 NOTE — Progress Notes (Signed)
[facility] [phone]   [phone]  OFFICE PROGRESS NOTE  [name], MD [address] [place] [address]  DIAGNOSIS: Metastatic non-small cell lung cancer, adenocarcinoma that was initially diagnosed as stage IIb (T1b, N1, M0) non-small cell lung cancer, adenocarcinoma presented with right upper lobe lung nodule  Detected Alteration(s) / Biomarker(s) Associated FDA-approved therapies Clinical Trial Availability % cfDNA or Amplification BRAF V600E approved by FDA Dabrafenib+trametinib, Encorafenib+binimetinib approved in other indication Cobimetinib, Dabrafenib, Trametinib, Vemurafenib, Vemurafenib+cobimetinib Yes 8.3%  BRCA1 R252S None (VUS) None (VUS) 0.1%   SMAD4 D454fs None None 7.2%  PD-L1 expression 35%  PRIOR THERAPY:  1) Status post right upper lobectomy with lymph node dissection on September 30, 2019 in [place].  She declines adjuvant systemic chemotherapy. 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 Mitzi Hansen.  CURRENT THERAPY: Systemic chemotherapy with carboplatin for AUC of 5, Alimta 500 Mg/M2 and Keytruda 200 Mg IV every 3 weeks.  First dose April 16, 2022.  Status post 11 cycles.  Starting cycle #5 she will be on maintenance treatment with Keytruda every 3 weeks.  INTERVAL HISTORY: Brandi Holmes 72 y.o. female returns to the clinic today for follow-up visit.  The patient is feeling fine today with no concerning complaints except for mild headache.  She denied having any current chest pain, shortness of breath, cough or hemoptysis.  She has no nausea, vomiting, diarrhea or constipation.  She has no recent weight loss or night sweats.  She has intermittent back pain at the area of the previous biopsy.  She is here today for evaluation before starting cycle #12 of her treatment.  MEDICAL HISTORY: Past  Medical History:  Diagnosis Date   Anemia    as a child only   Coronary artery calcification of native artery    Hyperlipidemia    Hypertension    Liver spots    " per pt"   Lung nodule    Wears glasses    Wears partial dentures     ALLERGIES:  is allergic to crab [shellfish allergy] and morphine and codeine.  MEDICATIONS:  Current Outpatient Medications  Medication Sig Dispense Refill   aspirin EC 81 MG tablet Take 81 mg by mouth at bedtime. Swallow whole.     atorvastatin (LIPITOR) 20 MG tablet Take 20 mg by mouth at bedtime.     folic acid (FOLVITE) 1 MG tablet TAKE ONE TABLET BY MOUTH EVERY DAY 30 tablet 0   furosemide (LASIX) 20 MG tablet Take 20 mg by mouth daily.     lactose free nutrition (BOOST) LIQD Take 237 mLs by mouth 2 (two) times daily between meals.     lidocaine (LIDODERM) 5 % Place 1 patch onto the skin daily. Remove & Discard patch within 12 hours or as directed by MD 30 patch 0   lisinopril (ZESTRIL) 10 MG tablet Take 1 tablet (10 mg total) by mouth every morning. 90 tablet 0   magnesium citrate SOLN Take 1 Bottle by mouth once a week.     OVER THE COUNTER MEDICATION "Prune X" -2-3 x week     pregabalin (LYRICA) 150 MG capsule Take 150 mg by mouth 2 (two) times daily.     prochlorperazine (COMPAZINE) 10 MG tablet Take 1 tablet (10 mg total) by mouth every 6 (six) hours as needed for nausea or vomiting.  30 tablet 0   tiZANidine (ZANAFLEX) 2 MG tablet Take 2 mg by mouth at bedtime.     traMADol HCl 100 MG TABS Take 1 tablet by mouth in the morning, at noon, and at bedtime.     No current facility-administered medications for this visit.    SURGICAL HISTORY:  Past Surgical History:  Procedure Laterality Date   ABDOMINAL HYSTERECTOMY  1974   heavy bleeding   ABDOMINAL HYSTERECTOMY     BRONCHIAL BRUSHINGS  08/13/2019   Procedure: BRONCHIAL BRUSHINGS;  Surgeon: Josephine Igo, DO;  Location: [facility];  Service: Pulmonary;;   BRONCHIAL WASHINGS  08/13/2019    Procedure: BRONCHIAL WASHINGS;  Surgeon: Josephine Igo, DO;  Location: [facility];  Service: Pulmonary;;   COLONOSCOPY W/ BIOPSIES AND POLYPECTOMY     DILATION AND CURETTAGE OF UTERUS     FINE NEEDLE ASPIRATION  08/13/2019   Procedure: FINE NEEDLE ASPIRATION (FNA) LINEAR;  Surgeon: Josephine Igo, DO;  Location: [facility];  Service: Pulmonary;;   FOOT SURGERY     bilateral bunions and hammer toes   IR RADIOLOGIST EVAL & MGMT  05/20/2022   LUNG BIOPSY  08/13/2019   Procedure: LUNG BIOPSY;  Surgeon: Josephine Igo, DO;  Location: [facility];  Service: Pulmonary;;   MULTIPLE TOOTH EXTRACTIONS     VIDEO BRONCHOSCOPY WITH ENDOBRONCHIAL NAVIGATION Right 08/13/2019   Procedure: VIDEO BRONCHOSCOPY WITH ENDOBRONCHIAL NAVIGATION;  Surgeon: Josephine Igo, DO;  Location: [facility];  Service: Pulmonary;  Laterality: Right;   VIDEO BRONCHOSCOPY WITH ENDOBRONCHIAL ULTRASOUND Right 08/13/2019   Procedure: VIDEO BRONCHOSCOPY WITH ENDOBRONCHIAL ULTRASOUND;  Surgeon: Josephine Igo, DO;  Location: [facility];  Service: Pulmonary;  Laterality: Right;    REVIEW OF SYSTEMS:  A comprehensive review of systems was negative except for: Constitutional: positive for fatigue Musculoskeletal: positive for back pain Neurological: positive for headaches   PHYSICAL EXAMINATION: General appearance: alert, cooperative, fatigued, and no distress Head: Normocephalic, without obvious abnormality, atraumatic Neck: no adenopathy, no JVD, supple, symmetrical, trachea midline, and thyroid not enlarged, symmetric, no tenderness/mass/nodules Lymph nodes: Cervical, supraclavicular, and axillary nodes normal. Resp: clear to auscultation bilaterally Back: symmetric, no curvature. ROM normal. No CVA tenderness. Cardio: regular rate and rhythm, S1, S2 normal, no murmur, click, rub or gallop GI: soft, non-tender; bowel sounds normal; no masses,  no organomegaly Extremities: extremities normal, atraumatic, no  cyanosis or edema  ECOG PERFORMANCE STATUS: 1 - Symptomatic but completely ambulatory  Blood pressure (!) 103/59, pulse 82, temperature 98 F (36.7 C), temperature source Oral, resp. rate 16, height 5\' 6"  (1.676 m), weight 145 lb 3.2 oz (65.9 kg), SpO2 99%.    LABORATORY DATA: Lab Results  Component Value Date   WBC 7.2 12/04/2022   HGB 11.4 (L) 12/04/2022   HCT 35.8 (L) 12/04/2022   MCV 93.7 12/04/2022   PLT 196 12/04/2022      Chemistry      Component Value Date/Time   NA 140 12/04/2022 0942   NA 142 07/22/2019 1522   K 3.9 12/04/2022 0942   CL 107 12/04/2022 0942   CO2 26 12/04/2022 0942   BUN 21 12/04/2022 0942   BUN 13 07/22/2019 1522   CREATININE 1.36 (H) 12/04/2022 0942   CREATININE 0.91 09/16/2011 1000      Component Value Date/Time   CALCIUM 9.4 12/04/2022 0942   ALKPHOS 69 12/04/2022 0942   AST 21 12/04/2022 0942   ALT 10 12/04/2022 0942   BILITOT 0.6 12/04/2022 5643  RADIOGRAPHIC STUDIES: CT Chest W Contrast  Result Date: 11/13/2022 CLINICAL DATA:  Non-small-cell lung cancer. Restaging. * Tracking Code: BO * EXAM: CT CHEST, ABDOMEN, AND PELVIS WITH CONTRAST TECHNIQUE: Multidetector CT imaging of the chest, abdomen and pelvis was performed following the standard protocol during bolus administration of intravenous contrast. RADIATION DOSE REDUCTION: This exam was performed according to the departmental dose-optimization program which includes automated exposure control, adjustment of the mA and/or kV according to patient size and/or use of iterative reconstruction technique. CONTRAST:  OMNIPAQUE IOHEXOL 300 MG/ML  SOLN COMPARISON:  Abdomen and pelvis CT 09/06/2022. Chest abdomen pelvis CT 06/17/2022. FINDINGS: CT CHEST FINDINGS Cardiovascular: Heart size upper normal. Small to moderate pericardial effusion is minimally progressive in the interval. Coronary artery calcification is evident. Mild atherosclerotic calcification is noted in the wall of the  thoracic aorta. Mediastinum/Nodes: No mediastinal lymphadenopathy. There is no hilar lymphadenopathy. The esophagus has normal imaging features. There is no axillary lymphadenopathy. Lungs/Pleura: Centrilobular emphsyema noted. Volume loss right hemithorax is stable. Scarring in the medial and posterior right upper lung is similar to prior with retro hilar and posterior right lung scarring also stable in the interval. Subpleural reticulation bilaterally suggests underlying fibrotic lung disease. No new suspicious pulmonary nodule or mass. Small right pleural effusion is similar to prior. Musculoskeletal: Scattered sclerotic bone lesions are compatible with metastatic disease. Lesion involving the T9 vertebral body has become more sclerotic in the interval suggesting healing. Compression deformity at T8 through T11 is stable. CT ABDOMEN PELVIS FINDINGS Hepatobiliary: Scattered tiny hypodensities in the liver parenchyma are too small to characterize but are statistically most likely benign. No followup imaging is recommended. There is no evidence for gallstones, gallbladder wall thickening, or pericholecystic fluid. No intrahepatic or extrahepatic biliary dilation. Pancreas: No focal mass lesion. No dilatation of the main duct. No intraparenchymal cyst. No peripancreatic edema. Spleen: No splenomegaly. No suspicious focal mass lesion. Adrenals/Urinary Tract: No adrenal nodule or mass. 18 x 14 mm ill-defined lesion lower pole right kidney with associated dystrophic calcification was measured previously at 20 x 19 mm. No evidence for hydroureter. The urinary bladder appears normal for the degree of distention. Stomach/Bowel: Stomach is unremarkable. No gastric wall thickening. No evidence of outlet obstruction. Duodenum is normally positioned as is the ligament of Treitz. No small bowel wall thickening. No small bowel dilatation. No gross colonic mass. No colonic wall thickening. Vascular/Lymphatic: There is moderate  atherosclerotic calcification of the abdominal aorta without aneurysm. There is no gastrohepatic or hepatoduodenal ligament lymphadenopathy. No retroperitoneal or mesenteric lymphadenopathy. No pelvic sidewall lymphadenopathy. Reproductive: Hysterectomy.  There is no adnexal mass. Other: No intraperitoneal free fluid. Musculoskeletal: Multiple sclerotic bone lesions of the lumbar spine and bony pelvis are consistent with metastatic disease, similar to prior. IMPRESSION: 1. No findings to suggest new or progressive metastatic disease. 2. Stable small right pleural effusion. 3. Small to moderate pericardial effusion is minimally progressive in the interval. 4. Stable ill-defined lesion lower pole right kidney with associated dystrophic calcification. W renal rist cell carcinoma a concern. 5. Stable sclerotic bone lesions compatible with metastatic disease. Lesion involving the T9 vertebral body has become more sclerotic in the interval suggesting healing. 6. Aortic Atherosclerosis (ICD10-I70.0) and Emphysema (ICD10-J43.9). Electronically Signed   By: Kennith Center M.D.   On: 11/13/2022 10:09   CT Abdomen Pelvis W Contrast  Result Date: 11/13/2022 CLINICAL DATA:  Non-small-cell lung cancer. Restaging. * Tracking Code: BO * EXAM: CT CHEST, ABDOMEN, AND PELVIS WITH CONTRAST TECHNIQUE: Multidetector  CT imaging of the chest, abdomen and pelvis was performed following the standard protocol during bolus administration of intravenous contrast. RADIATION DOSE REDUCTION: This exam was performed according to the departmental dose-optimization program which includes automated exposure control, adjustment of the mA and/or kV according to patient size and/or use of iterative reconstruction technique. CONTRAST:  OMNIPAQUE IOHEXOL 300 MG/ML  SOLN COMPARISON:  Abdomen and pelvis CT 09/06/2022. Chest abdomen pelvis CT 06/17/2022. FINDINGS: CT CHEST FINDINGS Cardiovascular: Heart size upper normal. Small to moderate  pericardial effusion is minimally progressive in the interval. Coronary artery calcification is evident. Mild atherosclerotic calcification is noted in the wall of the thoracic aorta. Mediastinum/Nodes: No mediastinal lymphadenopathy. There is no hilar lymphadenopathy. The esophagus has normal imaging features. There is no axillary lymphadenopathy. Lungs/Pleura: Centrilobular emphsyema noted. Volume loss right hemithorax is stable. Scarring in the medial and posterior right upper lung is similar to prior with retro hilar and posterior right lung scarring also stable in the interval. Subpleural reticulation bilaterally suggests underlying fibrotic lung disease. No new suspicious pulmonary nodule or mass. Small right pleural effusion is similar to prior. Musculoskeletal: Scattered sclerotic bone lesions are compatible with metastatic disease. Lesion involving the T9 vertebral body has become more sclerotic in the interval suggesting healing. Compression deformity at T8 through T11 is stable. CT ABDOMEN PELVIS FINDINGS Hepatobiliary: Scattered tiny hypodensities in the liver parenchyma are too small to characterize but are statistically most likely benign. No followup imaging is recommended. There is no evidence for gallstones, gallbladder wall thickening, or pericholecystic fluid. No intrahepatic or extrahepatic biliary dilation. Pancreas: No focal mass lesion. No dilatation of the main duct. No intraparenchymal cyst. No peripancreatic edema. Spleen: No splenomegaly. No suspicious focal mass lesion. Adrenals/Urinary Tract: No adrenal nodule or mass. 18 x 14 mm ill-defined lesion lower pole right kidney with associated dystrophic calcification was measured previously at 20 x 19 mm. No evidence for hydroureter. The urinary bladder appears normal for the degree of distention. Stomach/Bowel: Stomach is unremarkable. No gastric wall thickening. No evidence of outlet obstruction. Duodenum is normally positioned as is the  ligament of Treitz. No small bowel wall thickening. No small bowel dilatation. No gross colonic mass. No colonic wall thickening. Vascular/Lymphatic: There is moderate atherosclerotic calcification of the abdominal aorta without aneurysm. There is no gastrohepatic or hepatoduodenal ligament lymphadenopathy. No retroperitoneal or mesenteric lymphadenopathy. No pelvic sidewall lymphadenopathy. Reproductive: Hysterectomy.  There is no adnexal mass. Other: No intraperitoneal free fluid. Musculoskeletal: Multiple sclerotic bone lesions of the lumbar spine and bony pelvis are consistent with metastatic disease, similar to prior. IMPRESSION: 1. No findings to suggest new or progressive metastatic disease. 2. Stable small right pleural effusion. 3. Small to moderate pericardial effusion is minimally progressive in the interval. 4. Stable ill-defined lesion lower pole right kidney with associated dystrophic calcification. W renal rist cell carcinoma a concern. 5. Stable sclerotic bone lesions compatible with metastatic disease. Lesion involving the T9 vertebral body has become more sclerotic in the interval suggesting healing. 6. Aortic Atherosclerosis (ICD10-I70.0) and Emphysema (ICD10-J43.9). Electronically Signed   By: Kennith Center M.D.   On: 11/13/2022 10:09   ECHOCARDIOGRAM LIMITED  Result Date: 11/07/2022    ECHOCARDIOGRAM REPORT   Patient Name:   [facility] RITA Holmes Date of Exam: 11/05/2022 Medical Rec #:  [id number]              Height:       66.0 in Accession #:    [id number]  Weight:       158.1 lb Date of Birth:  June 09, 1950              BSA:          1.810 m Patient Age:    72 years               BP:           106/60 mmHg Patient Gender: F                      HR:           83 bpm. Exam Location:  Outpatient Procedure: Cardiac Doppler and Limited Echo Indications:    Lung Cancer, Chemotherapy  History:        Patient has prior history of Echocardiogram examinations, most                 recent  07/27/2019. Lung Cancer; Risk Factors:Hypertension and                 Former Smoker.  Sonographer:    Milbert Coulter Referring Phys: [id number] SUNIT TOLIA IMPRESSIONS  1. LIMITED ECHO.  2. Left ventricular ejection fraction, by estimation, is 60 to 65%. The left ventricle has normal function. The left ventricle has no regional wall motion abnormalities. Left ventricular diastolic function could not be evaluated.  3. Right ventricular systolic function visually normal. The right ventricular size is visually normal. Mildly increased right ventricular wall thickness.  4. Left atrial size was grossly normal.  5. Right atrial size was grossly normal.  6. Moderate pericardial effusion. The pericardial effusion is circumferential. There is no evidence of cardiac tamponade.  7. The mitral valve is grossly normal.  8. The aortic valve is grossly normal.  9. The inferior vena cava is dilated in size with >50% respiratory variability, suggesting right atrial pressure of 8 mmHg. Comparison(s): Prior study 07/27/2019: LVEF 55%, Grade 1 diastolic dysfunction, mild LVH, mild TR, RVSP . FINDINGS  Left Ventricle: Left ventricular ejection fraction, by estimation, is 60 to 65%. The left ventricle has normal function. The left ventricle has no regional wall motion abnormalities. Left ventricular diastolic function could not be evaluated. Right Ventricle: The right ventricular size is visually normal. Mildly increased right ventricular wall thickness. Right ventricular systolic function visually normal. Left Atrium: Left atrial size was grossly normal. Right Atrium: Right atrial size was grossly normal. Pericardium: A moderately sized pericardial effusion is present. The pericardial effusion is circumferential. There is no evidence of cardiac tamponade. Mitral Valve: The mitral valve is grossly normal. Tricuspid Valve: The tricuspid valve is grossly normal. Aortic Valve: The aortic valve is grossly normal. Pulmonic Valve: The pulmonic  valve was not assessed. Aorta: The ascending aorta was not well visualized and the aortic root was not well visualized. Venous: The inferior vena cava is dilated in size with greater than 50% respiratory variability, suggesting right atrial pressure of 8 mmHg. IAS/Shunts: The interatrial septum was not assessed. Sunit Tolia DO Electronically signed by Tessa Lerner DO Signature Date/Time: 11/07/2022/6:10:07 AM    Final      ASSESSMENT AND PLAN: This is a pleasant 72 years old African-American female diagnosed with metastatic also lung cancer, adenocarcinoma initially diagnosed as stage IIb (T1b, N1, M0) non-small cell lung cancer, adenocarcinoma status post a right upper lobectomy with lymph node dissection on September 30, 2019 in [place].  She declined adjuvant systemic chemotherapy at that time. She was found to  have evidence for disease metastasis in November 2023 presenting with multiple metastatic bone lesions. The patient had molecular studies that showed an actionable mutation with BRAF V600E mutation and PD-L1 expression of 35%. She underwent palliative radiotherapy to the T9 vertebral lesion under the care of Dr. Mitzi Hansen. The patient is currently undergoing first-line combination of systemic chemotherapy with carboplatin for AUC of 5, Alimta 500 Mg/M2 and Keytruda 200 Mg IV every 3 weeks.  Status post 11 cycles.  Starting from cycle #5 she will be on maintenance treatment with Alimta and Keytruda every 3 weeks.  Starting from cycle #5 she will be on single agent Keytruda only.  Alimta will be discontinued secondary to renal insufficiency. The patient continues to tolerate this treatment well with no concerning adverse effects. I recommended for her to proceed with cycle #12 today as planned. For the headache, she was advised to take Tylenol on as-needed basis and if persistent, will consider repeating MRI of the brain to rule out brain metastasis. The patient will come back for follow-up visit in 3 weeks  for evaluation before the next cycle of her treatment. She was advised to call immediately if she has any other concerning symptoms in the interval. The patient voices understanding of current disease status and treatment options and is in agreement with the current care plan. The total time spent in the appointment was 20 minutes.  All questions were answered. The patient knows to call the clinic with any problems, questions or concerns. We can certainly see the patient much sooner if necessary.  Disclaimer: This note was dictated with voice recognition software. Similar sounding words can inadvertently be transcribed and may not be corrected upon review.

## 2022-12-05 LAB — T4: T4, Total: 6 ug/dL (ref 4.5–12.0)

## 2022-12-11 NOTE — Progress Notes (Unsigned)
[facility]           [place]  History and Physical   Brandi Holmes is a 72 y.o. female who presents with after having leg swelling and a finding fluid filled structure behind her knee and was referred to vascular surgery.  She did not have evidence of DVT on that duplex.    She states that she had started some new chemo earlier in the year (January) and developed severe swelling of both legs after this treatment.  Her legs were weeping.  This did resolve and this has not happened since then.  She states that she has a cystic area behind her right knee that she uses a pillow underneath at night time.    She denies hx of DVT, she has not had procedures on her legs.  She has not needed compression as she has not had any further swelling.  She did not really tolerate them when she wore them as she got pain around her knees.  She does have mild skin color changes on her lower legs.    She is currently undergoing lifelong chemo about every 3 weeks.  She is from [place].    The pt is on a statin for cholesterol management.  The pt is on a daily aspirin.   Other AC:  none The pt is on ACEI, diuretic for hypertension.   The pt is not on medication for diabetes.   Tobacco hx:  former-she quit smoking/drinking in March 2021.   Pt does not have family hx of AAA.  Past Medical History:  Diagnosis Date   Anemia    as a child only   Coronary artery calcification of native artery    Hyperlipidemia    Hypertension    Liver spots    " per pt"   Lung nodule    Wears glasses    Wears partial dentures     Past Surgical History:  Procedure Laterality Date   ABDOMINAL HYSTERECTOMY  1974   heavy bleeding   ABDOMINAL HYSTERECTOMY     BRONCHIAL BRUSHINGS  08/13/2019   Procedure: BRONCHIAL BRUSHINGS;  Surgeon: Josephine Igo, DO;  Location: [facility];  Service: Pulmonary;;   BRONCHIAL WASHINGS  08/13/2019   Procedure: BRONCHIAL WASHINGS;  Surgeon:  Josephine Igo, DO;  Location: [facility];  Service: Pulmonary;;   COLONOSCOPY W/ BIOPSIES AND POLYPECTOMY     DILATION AND CURETTAGE OF UTERUS     FINE NEEDLE ASPIRATION  08/13/2019   Procedure: FINE NEEDLE ASPIRATION (FNA) LINEAR;  Surgeon: Josephine Igo, DO;  Location: [facility];  Service: Pulmonary;;   FOOT SURGERY     bilateral bunions and hammer toes   IR RADIOLOGIST EVAL & MGMT  05/20/2022   LUNG BIOPSY  08/13/2019   Procedure: LUNG BIOPSY;  Surgeon: Josephine Igo, DO;  Location: [facility];  Service: Pulmonary;;   MULTIPLE TOOTH EXTRACTIONS     VIDEO BRONCHOSCOPY WITH ENDOBRONCHIAL NAVIGATION Right 08/13/2019   Procedure: VIDEO BRONCHOSCOPY WITH ENDOBRONCHIAL NAVIGATION;  Surgeon: Josephine Igo, DO;  Location: [facility];  Service: Pulmonary;  Laterality: Right;   VIDEO BRONCHOSCOPY WITH ENDOBRONCHIAL ULTRASOUND Right 08/13/2019   Procedure: VIDEO BRONCHOSCOPY WITH ENDOBRONCHIAL ULTRASOUND;  Surgeon: Josephine Igo, DO;  Location: [facility];  Service: Pulmonary;  Laterality: Right;    Social History   Socioeconomic History   Marital status: Divorced    Spouse name: Not on file  Number of children: 2   Years of education: Not on file   Highest education level: Not on file  Occupational History   Not on file  Tobacco Use   Smoking status: Former    Current packs/day: 0.00    Average packs/day: 0.3 packs/day for 50.0 years (15.0 ttl pk-yrs)    Types: Cigarettes    Start date: 30    Quit date: 2016    Years since quitting: 8.6   Smokeless tobacco: Never  Vaping Use   Vaping status: Never Used  Substance and Sexual Activity   Alcohol use: Not Currently    Comment: occasional   Drug use: Not Currently   Sexual activity: Not Currently  Other Topics Concern   Not on file  Social History Narrative   ** Merged History Encounter **       Lives alone.  Divoced.  Adult children- 1 living, 1 deceased.   Social Determinants of Health   Financial  Resource Strain: Not on file  Food Insecurity: No Food Insecurity (09/07/2022)   Hunger Vital Sign    Worried About Running Out of Food in the Last Year: Never true    Ran Out of Food in the Last Year: Never true  Transportation Needs: No Transportation Needs (09/07/2022)   PRAPARE - Administrator, Civil Service (Medical): No    Lack of Transportation (Non-Medical): No  Physical Activity: Not on file  Stress: Not on file  Social Connections: Moderately Integrated (06/19/2022)   Social Connection and Isolation Panel [NHANES]    Frequency of Communication with Friends and Family: More than three times a week    Frequency of Social Gatherings with Friends and Family: Three times a week    Attends Religious Services: 1 to 4 times per year    Active Member of Clubs or Organizations: Yes    Attends Banker Meetings: 1 to 4 times per year    Marital Status: Widowed  Intimate Partner Violence: Not At Risk (09/07/2022)   Humiliation, Afraid, Rape, and Kick questionnaire    Fear of Current or Ex-Partner: No    Emotionally Abused: No    Physically Abused: No    Sexually Abused: No     Family History  Problem Relation Age of Onset   Heart disease Mother 50       bypass at age    Hypertension Mother    Pneumonia Brother    Cancer Paternal Aunt    Diabetes Neg Hx    Stroke Neg Hx     Current Outpatient Medications  Medication Sig Dispense Refill   aspirin EC 81 MG tablet Take 81 mg by mouth at bedtime. Swallow whole.     atorvastatin (LIPITOR) 20 MG tablet Take 20 mg by mouth at bedtime.     folic acid (FOLVITE) 1 MG tablet TAKE ONE TABLET BY MOUTH EVERY DAY 30 tablet 0   furosemide (LASIX) 20 MG tablet Take 20 mg by mouth daily.     lactose free nutrition (BOOST) LIQD Take 237 mLs by mouth 2 (two) times daily between meals.     lidocaine (LIDODERM) 5 % Place 1 patch onto the skin daily. Remove & Discard patch within 12 hours or as directed by MD 30 patch 0    lisinopril (ZESTRIL) 10 MG tablet Take 1 tablet (10 mg total) by mouth every morning. 90 tablet 0   magnesium citrate SOLN Take 1 Bottle by mouth once a week.  OVER THE COUNTER MEDICATION "Prune X" -2-3 x week     pregabalin (LYRICA) 150 MG capsule Take 150 mg by mouth 2 (two) times daily.     prochlorperazine (COMPAZINE) 10 MG tablet Take 1 tablet (10 mg total) by mouth every 6 (six) hours as needed for nausea or vomiting. 30 tablet 0   tiZANidine (ZANAFLEX) 2 MG tablet Take 2 mg by mouth at bedtime.     traMADol HCl 100 MG TABS Take 1 tablet by mouth in the morning, at noon, and at bedtime.     No current facility-administered medications for this visit.    Allergies  Allergen Reactions   Crab [Shellfish Allergy] Swelling    Swelling of feet   Morphine And Codeine Other (See Comments)    Nightmares     REVIEW OF SYSTEMS:   [X]  denotes positive finding, [ ]  denotes negative finding Cardiac  Comments:  Chest pain or chest pressure:    Shortness of breath upon exertion:    Short of breath when lying flat:    Irregular heart rhythm:        Vascular    Pain in calf, thigh, or hip brought on by ambulation:    Pain in feet at night that wakes you up from your sleep:     Blood clot in your veins:    Leg swelling:  x       Pulmonary    Oxygen at home:    Productive cough:     Wheezing:         Neurologic    Sudden weakness in arms or legs:     Sudden numbness in arms or legs:     Sudden onset of difficulty speaking or slurred speech:    Temporary loss of vision in one eye:     Problems with dizziness:         Gastrointestinal    Blood in stool:     Vomited blood:         Genitourinary    Burning when urinating:     Blood in urine:        Psychiatric    Major depression:         Hematologic    Bleeding problems:    Problems with blood clotting too easily:        Skin    Rashes or ulcers:        Constitutional    Fever or chills:      PHYSICAL  EXAMINATION:  Today's Vitals   12/12/22 1330  BP: 129/80  Pulse: 83  Resp: 20  Temp: 99 F (37.2 C)  TempSrc: Temporal  SpO2: 97%  Weight: 147 lb 12.8 oz (67 kg)  Height: 5\' 6"  (1.676 m)   Body mass index is 23.86 kg/m.   General:  WDWN in NAD; vital signs documented above Gait: Not observed HENT: WNL, normocephalic Pulmonary: normal non-labored breathing without wheezing Cardiac: regular HR; without carotid bruits Abdomen: soft, NT, aortic pulse is not palpable Skin: without rashes Vascular Exam/Pulses:  Right Left  Radial 2+ (normal) 2+ (normal)  DP 2+ (normal) 2+ (normal)  PT 2+ (normal) 2+ (normal)   Extremities: mild hemosiderin staining bilateral lower legs.  Minimal swelling BL  Neurologic: A&O X 3;  moving all extremities equally Psychiatric:  The pt has Normal affect.   Non-Invasive Vascular Imaging:   Venous duplex on 12/12/2022: Venous Reflux Times  +------------------+---------+------+-----------+------------+-------------   RIGHT  Reflux NoRefluxReflux TimeDiameter cmsComments                                 Yes                                       +------------------+---------+------+-----------+------------+-------------  CFV                        yes   >1 second                           +------------------+---------+------+-----------+------------+-------------   FV mid            no                                                   +------------------+---------+------+-----------+------------+-------------   Popliteal        no                                                  +------------------+---------+------+-----------+------------+-------------  GSV at [facility]        no                            0.59                  +------------------+---------+------+-----------+------------+-------------   GSV prox thigh    no                            0.46                   +------------------+---------+------+-----------+------------+-------------  GSV mid thigh     no                            0.35                  +------------------+---------+------+-----------+------------+-------------   GSV dist thigh    no                            0.36    out of fascia  +------------------+---------+------+-----------+------------+-------------  GSV at knee       no                            0.37    out of fascia  +------------------+---------+------+-----------+------------+-------------  SSV Pop Fossa     no                            0.38                 +------------------+---------+------+-----------+------------+-------------   anterior accessoryno  0.45                  +------------------+---------+------+-----------+------------+-------------   Summary:  Right:  - No evidence of deep vein thrombosis from the common femoral through the popliteal veins.  - No evidence of superficial venous thrombosis.  - The common femoral vein is not competent.  - The great and small saphneous veins are competent.  - Cystic structure in the popliteal fossa measuring approximately 1.97 x 3.13 cm.     Brandi Holmes is a 72 y.o. female who presents with: hx of BLE swelling after chemo treatment but has not re-occurred.    -pt has easily palpable pedal pulses bilaterally -pt does not have evidence of DVT.  Pt does have venous reflux in the right deep venous system and her superficial venous system is competent.  She is not a candidate for a laser procedure.    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  Offered referral to orthopedics for evaluation but she is currently not interested in this.   -discussed with pt about wearing knee high 15-20 mmHg compression stockings.  She has worn these in the past and were painful.   -discussed the importance of leg elevation and how  to elevate properly - pt is advised to elevate their legs and a diagram is given to them to demonstrate for pt to lay flat on their back with knees elevated and slightly bent with their feet higher than their knees, which puts their feet higher than their heart for 15 minutes per day.  If pt cannot lay flat, advised to lay as flat as possible.  -pt is advised to continue as much walking as possible and avoid sitting or standing for long periods of time.  -discussed importance of weight loss and exercise and that water aerobics would also be beneficial.  She states that she used to go to [employer] but her eyes are not sensitive to sunlight from chemo and does not usually go out during the day.   -handout with recommendations given -pt will f/u as needed.  She knows to call if she has any issues as we would be glad to see her back.    Doreatha Massed, [facility] [phone]  Clinic MD:  Edilia Bo

## 2022-12-12 ENCOUNTER — Ambulatory Visit (HOSPITAL_COMMUNITY)
Admission: RE | Admit: 2022-12-12 | Discharge: 2022-12-12 | Disposition: A | Discharge status: Home / Self Care | Payer: Medicare HMO | Source: Ambulatory Visit | Attending: Vascular Surgery | Admitting: Vascular Surgery

## 2022-12-12 ENCOUNTER — Ambulatory Visit (INDEPENDENT_AMBULATORY_CARE_PROVIDER_SITE_OTHER): Payer: Medicare HMO | Admitting: Physician Assistant

## 2022-12-12 ENCOUNTER — Encounter: Payer: Self-pay | Admitting: Physician Assistant

## 2022-12-12 VITALS — BP 129/80 | HR 83 | Temp 99.0°F | Resp 20 | Ht 66.0 in | Wt 147.8 lb

## 2022-12-12 DIAGNOSIS — M7989 Other specified soft tissue disorders: Secondary | ICD-10-CM | POA: Insufficient documentation

## 2022-12-22 NOTE — Progress Notes (Unsigned)
[facility] OFFICE PROGRESS NOTE  Brandi Able, MD [address] [place] [address]  DIAGNOSIS:  Metastatic non-small cell lung cancer, adenocarcinoma that was initially diagnosed as stage IIb (T1b, N1, M0) non-small cell lung cancer, adenocarcinoma presented with right upper lobe lung nodule   Detected Alteration(s) / Biomarker(s) Associated FDA-approved therapiesClinical Trial Availability% cfDNA or Amplification BRAF V600E approved by FDA Dabrafenib+trametinib, Encorafenib+binimetinib approved in other indication Cobimetinib, Dabrafenib, Trametinib, Vemurafenib, Vemurafenib+cobimetinib Yes8.3%   BRCA1 R252S None (VUS) None (VUS) 0.1%     SMAD4 D463fs None None 7.2%   PD-L1 expression 35%  PRIOR THERAPY: 1) Status post right upper lobectomy with lymph node dissection on September 30, 2019 in [place].  She declines adjuvant systemic chemotherapy. 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 Mitzi Hansen.  CURRENT THERAPY: Systemic chemotherapy with carboplatin for AUC of 5, Alimta 500 Mg/M2 and Keytruda 200 Mg IV every 3 weeks.  First dose April 16, 2022.  Status post 12 cycles.  Starting cycle #5 she will be on maintenance treatment with Keytruda every 3 weeks.   INTERVAL HISTORY: Brandi Holmes 72 y.o. female returns to the clinic for a follow-up visit.  The patient is feeling fairly today without any concerning complaints except for continued back pain since she was diagnosed with metastatic disease last year at T9. However, it is a lot better controlled at this time. Her pain is managed by her PCP. She also has had some right sided rib pain since her lobectomy in 2021 in [place]. She is currently undergoing maintenance treatment with immunotherapy with Keytruda.  She is tolerating this well.  Today, she denies any fever, chills, night sweats, or  unexplained weight loss. She gained a few pounds. She has some dyspnea on exertion moreso related to decreased exercise tolerance and fatigue. She states this is stable. She states overall her breathing is "good". She may intermittently get a cough. This is her baseline. She reports she feels like she needs to cough up phlegm but never gets anything up. Denies any nausea, vomiting, diarrhea, or constipation.  Denies any headaches or visual changes. At her last appointment with Dr. Arbutus Ped, she was endorsing headaches. Dr. Arbutus Ped would consider her for brain MRI if continued or worsening symptoms. Today, she mentions she has been blowing her nose more and her headaches have improved. She thinks it is related to sinus. Denies significant headaches. If she does, she states they are "light" headaches.  Denies any rashes or skin changes. She is eating liver once a week for her anemia. She is here today for evaluation repeat blood work before undergoing cycle #13   MEDICAL HISTORY: Past Medical History:  Diagnosis Date   Anemia    as a child only   Coronary artery calcification of native artery    Hyperlipidemia    Hypertension    Liver spots    " per pt"   Lung nodule    Wears glasses    Wears partial dentures     ALLERGIES:  is allergic to crab [shellfish allergy] and morphine and codeine.  MEDICATIONS:  Current Outpatient Medications  Medication Sig Dispense Refill   aspirin EC 81 MG tablet Take 81 mg by mouth at bedtime. Swallow whole.     atorvastatin (LIPITOR) 20 MG tablet Take 20 mg by mouth at bedtime.     folic acid (FOLVITE) 1  MG tablet TAKE ONE TABLET BY MOUTH EVERY DAY 30 tablet 0   furosemide (LASIX) 20 MG tablet Take 20 mg by mouth daily.     lactose free nutrition (BOOST) LIQD Take 237 mLs by mouth 2 (two) times daily between meals.     lidocaine (LIDODERM) 5 % Place 1 patch onto the skin daily. Remove & Discard patch within 12 hours or as directed by MD 30 patch 0   lisinopril  (ZESTRIL) 10 MG tablet Take 1 tablet (10 mg total) by mouth every morning. 90 tablet 0   magnesium citrate SOLN Take 1 Bottle by mouth once a week.     OVER THE COUNTER MEDICATION "Prune X" -2-3 x week     pregabalin (LYRICA) 150 MG capsule Take 150 mg by mouth 2 (two) times daily.     prochlorperazine (COMPAZINE) 10 MG tablet Take 1 tablet (10 mg total) by mouth every 6 (six) hours as needed for nausea or vomiting. 30 tablet 0   tiZANidine (ZANAFLEX) 2 MG tablet Take 2 mg by mouth at bedtime.     traMADol HCl 100 MG TABS Take 1 tablet by mouth in the morning, at noon, and at bedtime.     No current facility-administered medications for this visit.    SURGICAL HISTORY:  Past Surgical History:  Procedure Laterality Date   ABDOMINAL HYSTERECTOMY  1974   heavy bleeding   ABDOMINAL HYSTERECTOMY     BRONCHIAL BRUSHINGS  08/13/2019   Procedure: BRONCHIAL BRUSHINGS;  Surgeon: Josephine Igo, DO;  Location: [facility];  Service: Pulmonary;;   BRONCHIAL WASHINGS  08/13/2019   Procedure: BRONCHIAL WASHINGS;  Surgeon: Josephine Igo, DO;  Location: [facility];  Service: Pulmonary;;   COLONOSCOPY W/ BIOPSIES AND POLYPECTOMY     DILATION AND CURETTAGE OF UTERUS     FINE NEEDLE ASPIRATION  08/13/2019   Procedure: FINE NEEDLE ASPIRATION (FNA) LINEAR;  Surgeon: Josephine Igo, DO;  Location: [facility];  Service: Pulmonary;;   FOOT SURGERY     bilateral bunions and hammer toes   IR RADIOLOGIST EVAL & MGMT  05/20/2022   LUNG BIOPSY  08/13/2019   Procedure: LUNG BIOPSY;  Surgeon: Josephine Igo, DO;  Location: [facility];  Service: Pulmonary;;   MULTIPLE TOOTH EXTRACTIONS     VIDEO BRONCHOSCOPY WITH ENDOBRONCHIAL NAVIGATION Right 08/13/2019   Procedure: VIDEO BRONCHOSCOPY WITH ENDOBRONCHIAL NAVIGATION;  Surgeon: Josephine Igo, DO;  Location: [facility];  Service: Pulmonary;  Laterality: Right;   VIDEO BRONCHOSCOPY WITH ENDOBRONCHIAL ULTRASOUND Right 08/13/2019   Procedure: VIDEO  BRONCHOSCOPY WITH ENDOBRONCHIAL ULTRASOUND;  Surgeon: Josephine Igo, DO;  Location: [facility];  Service: Pulmonary;  Laterality: Right;    REVIEW OF SYSTEMS:   Review of Systems  Constitutional: Positive for stable fatigue. Negative for appetite change, chills,  fever and unexpected weight change.  HENT: Negative for mouth sores, nosebleeds, sore throat and trouble swallowing.   Eyes: Negative for eye problems and icterus.  Respiratory: Positive for stable dyspnea on exertion. Stable intermittent cough. Negative for hemoptysis and wheezing.   Cardiovascular: Positive for right sided rib pain since undergoing lobectomy. Positive for stable bilateral ankle swelling.  Gastrointestinal: Negative for abdominal pain, constipation, diarrhea, nausea and vomiting.  Genitourinary: Negative for bladder incontinence, difficulty urinating, dysuria, frequency and hematuria.   Musculoskeletal: Positive for chronic back pain (better controlled than prior). Negative for problem, neck pain and neck stiffness.  Skin: Negative for itching and rash.  Neurological: Negative for dizziness, extremity weakness,  gait problem, headaches, light-headedness and seizures.  Hematological: Negative for adenopathy. Does not bruise/bleed easily.  Psychiatric/Behavioral: Negative for confusion, depression and sleep disturbance. The patient is not nervous/anxious.     PHYSICAL EXAMINATION:  There were no vitals taken for this visit.  ECOG PERFORMANCE STATUS: 1  Physical Exam  Constitutional: Oriented to person, place, and time and well-developed, well-nourished, and in no distress.   HENT:  Head: Normocephalic and atraumatic.  Mouth/Throat: Oropharynx is clear and moist. No oropharyngeal exudate.  Eyes: Conjunctivae are normal. Right eye exhibits no discharge. Left eye exhibits no discharge. No scleral icterus.  Neck: Normal range of motion. Neck supple.  Cardiovascular: Normal rate, regular rhythm, normal heart  sounds and intact distal pulses.   Pulmonary/Chest: Effort normal and breath sounds normal. No respiratory distress. No wheezes. No rales.  Abdominal: Soft. Bowel sounds are normal. Exhibits no distension and no mass. There is no tenderness.  Musculoskeletal: Tenderness with minimal palpation to the thoracic spine. Normal range of motion. Exhibits no edema.  Lymphadenopathy:    No cervical adenopathy.  Neurological: Alert and oriented to person, place, and time. Exhibits muscle wasting. Ambulates leaning forward due to back pain.  Skin: Skin is warm and dry. No rash noted. Not diaphoretic. No erythema. No pallor.  Psychiatric: Mood, memory and judgment normal.  Vitals reviewed.  LABORATORY DATA: Lab Results  Component Value Date   WBC 7.2 12/04/2022   HGB 11.4 (L) 12/04/2022   HCT 35.8 (L) 12/04/2022   MCV 93.7 12/04/2022   PLT 196 12/04/2022      Chemistry      Component Value Date/Time   NA 140 12/04/2022 0942   NA 142 07/22/2019 1522   K 3.9 12/04/2022 0942   CL 107 12/04/2022 0942   CO2 26 12/04/2022 0942   BUN 21 12/04/2022 0942   BUN 13 07/22/2019 1522   CREATININE 1.36 (H) 12/04/2022 0942   CREATININE 0.91 09/16/2011 1000      Component Value Date/Time   CALCIUM 9.4 12/04/2022 0942   ALKPHOS 69 12/04/2022 0942   AST 21 12/04/2022 0942   ALT 10 12/04/2022 0942   BILITOT 0.6 12/04/2022 0942       RADIOGRAPHIC STUDIES:  VAS [place] LOWER EXTREMITY VENOUS REFLUX  Result Date: 12/12/2022  Lower Venous Reflux Study Patient Name:  [facility] Brandi Holmes  Date of Exam:   12/12/2022 Medical Rec #: [id number]               Accession #:    [id number] Date of Birth: [birth year]               Patient Gender: F Patient Age:   35 years Exam Location:  [facility] Procedure:      VAS [place] LOWER EXTREMITY VENOUS REFLUX Referring Phys: EMMA COLLINS --------------------------------------------------------------------------------  Indications: Right lower extremity edema.   Performing Technologist: Dorthula Matas RVS, RCS  Examination Guidelines: A complete evaluation includes B-mode imaging, spectral Doppler, color Doppler, and power Doppler as needed of all accessible portions of each vessel. Bilateral testing is considered an integral part of a complete examination. Limited examinations for reoccurring indications may be performed as noted. The reflux portion of the exam is performed with the patient in reverse Trendelenburg. Significant venous reflux is defined as >500 ms in the superficial venous system, and >1 second in the deep venous system.  Venous Reflux Times +------------------+---------+------+-----------+------------+-------------+ RIGHT             Reflux NoRefluxReflux TimeDiameter  cmsComments                                  Yes                                       +------------------+---------+------+-----------+------------+-------------+ CFV                         yes   >1 second                           +------------------+---------+------+-----------+------------+-------------+ FV mid            no                                                  +------------------+---------+------+-----------+------------+-------------+ Popliteal         no                                                  +------------------+---------+------+-----------+------------+-------------+ GSV at [facility]        no                            0.59                  +------------------+---------+------+-----------+------------+-------------+ GSV prox thigh    no                            0.46                  +------------------+---------+------+-----------+------------+-------------+ GSV mid thigh     no                            0.35                  +------------------+---------+------+-----------+------------+-------------+ GSV dist thigh    no                            0.36    out of fascia  +------------------+---------+------+-----------+------------+-------------+ GSV at knee       no                            0.37    out of fascia +------------------+---------+------+-----------+------------+-------------+ SSV Pop Fossa     no                            0.38                  +------------------+---------+------+-----------+------------+-------------+ anterior accessoryno                            0.45                  +------------------+---------+------+-----------+------------+-------------+  Summary: Right: - No evidence of deep vein thrombosis from the common femoral through the popliteal veins. - No evidence of superficial venous thrombosis. - The common femoral vein is not competent. - The great and small saphneous veins are competent. - Cystic structure in the popliteal fossa measuring approximately 1.97 x 3.13 cm.  *See table(s) above for measurements and observations. Electronically signed by Waverly Ferrari MD on 12/12/2022 at 1:50:05 PM.    Final      ASSESSMENT/PLAN:  This is a very pleasant 72 year old African-American female diagnosed with metastatic non-small cell lung cancer, adenocarcinoma.  She was initially diagnosed as a stage IIb (T1b, N1, M0).  She is status post right upper lobectomy with lymph node dissection on September 30, 2019 in [place].  She declined adjuvant systemic chemotherapy at that time.  She is found to have metastatic disease to the bone in November 2023.  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  She underwent palliative radiation to T9 under the care of Dr. Mitzi Hansen.  She is currently on systemic therapy.  She was started on systemic chemotherapy with carboplatin for an AUC of 5, Alimta 500 mg/m, and Keytruda 200 mg IV every 3 weeks.  She is status post 12 cycles.  Starting from cycle #5, she started maintenance treatment with immunotherapy with Keytruda only.  Alimta was  discontinued secondary to renal insufficiency.   Labs were reviewed.  Recommend that she proceed with cycle #13 today as schedule.  I will arrange for restaging CT scan of the chest, abdomen, and pelvis prior to starting her next cycle of treatment. I will order without contrast due to her renal insuffiencey.   We will see her back for follow-up visit in 3 weeks for evaluation repeat blood work before undergoing cycle #14.  The patient was advised to call immediately if she has any concerning symptoms in the interval. The patient voices understanding of current disease status and treatment options and is in agreement with the current care plan. All questions were answered. The patient knows to call the clinic with any problems, questions or concerns. We can certainly see the patient much sooner if necessary     No orders of the defined types were placed in this encounter.   The total time spent in the appointment was 20-29 minutes  Sabin Gibeault L Tameisha Covell, PA-C 12/22/22

## 2022-12-25 ENCOUNTER — Inpatient Hospital Stay (HOSPITAL_BASED_OUTPATIENT_CLINIC_OR_DEPARTMENT_OTHER): Payer: Medicare HMO | Admitting: Physician Assistant

## 2022-12-25 ENCOUNTER — Inpatient Hospital Stay: Payer: Medicare HMO | Attending: Physician Assistant

## 2022-12-25 ENCOUNTER — Inpatient Hospital Stay: Payer: Medicare HMO

## 2022-12-25 VITALS — BP 134/82 | HR 70 | Temp 98.1°F | Resp 18 | Ht 66.0 in | Wt 151.9 lb

## 2022-12-25 DIAGNOSIS — N289 Disorder of kidney and ureter, unspecified: Secondary | ICD-10-CM | POA: Insufficient documentation

## 2022-12-25 DIAGNOSIS — Z902 Acquired absence of lung [part of]: Secondary | ICD-10-CM | POA: Insufficient documentation

## 2022-12-25 DIAGNOSIS — Z5112 Encounter for antineoplastic immunotherapy: Secondary | ICD-10-CM

## 2022-12-25 DIAGNOSIS — R0609 Other forms of dyspnea: Secondary | ICD-10-CM | POA: Diagnosis not present

## 2022-12-25 DIAGNOSIS — C7951 Secondary malignant neoplasm of bone: Secondary | ICD-10-CM

## 2022-12-25 DIAGNOSIS — Z923 Personal history of irradiation: Secondary | ICD-10-CM | POA: Diagnosis not present

## 2022-12-25 DIAGNOSIS — D649 Anemia, unspecified: Secondary | ICD-10-CM | POA: Diagnosis not present

## 2022-12-25 DIAGNOSIS — C3411 Malignant neoplasm of upper lobe, right bronchus or lung: Secondary | ICD-10-CM

## 2022-12-25 LAB — CBC WITH DIFFERENTIAL (CANCER CENTER ONLY)
Abs Immature Granulocytes: 0.01 10*3/uL (ref 0.00–0.07)
Basophils Absolute: 0.1 10*3/uL (ref 0.0–0.1)
Basophils Relative: 1 %
Eosinophils Absolute: 1 10*3/uL — ABNORMAL HIGH (ref 0.0–0.5)
Eosinophils Relative: 14 %
HCT: 34 % — ABNORMAL LOW (ref 36.0–46.0)
Hemoglobin: 11.1 g/dL — ABNORMAL LOW (ref 12.0–15.0)
Immature Granulocytes: 0 %
Lymphocytes Relative: 27 %
Lymphs Abs: 1.9 10*3/uL (ref 0.7–4.0)
MCH: 30 pg (ref 26.0–34.0)
MCHC: 32.6 g/dL (ref 30.0–36.0)
MCV: 91.9 fL (ref 80.0–100.0)
Monocytes Absolute: 0.8 10*3/uL (ref 0.1–1.0)
Monocytes Relative: 11 %
Neutro Abs: 3.4 10*3/uL (ref 1.7–7.7)
Neutrophils Relative %: 47 %
Platelet Count: 181 10*3/uL (ref 150–400)
RBC: 3.7 MIL/uL — ABNORMAL LOW (ref 3.87–5.11)
RDW: 13.3 % (ref 11.5–15.5)
WBC Count: 7.1 10*3/uL (ref 4.0–10.5)
nRBC: 0 % (ref 0.0–0.2)

## 2022-12-25 LAB — CMP (CANCER CENTER ONLY)
ALT: 19 U/L (ref 0–44)
AST: 32 U/L (ref 15–41)
Albumin: 3.4 g/dL — ABNORMAL LOW (ref 3.5–5.0)
Alkaline Phosphatase: 74 U/L (ref 38–126)
Anion gap: 5 (ref 5–15)
BUN: 22 mg/dL (ref 8–23)
CO2: 28 mmol/L (ref 22–32)
Calcium: 9.1 mg/dL (ref 8.9–10.3)
Chloride: 107 mmol/L (ref 98–111)
Creatinine: 1.44 mg/dL — ABNORMAL HIGH (ref 0.44–1.00)
GFR, Estimated: 38 mL/min — ABNORMAL LOW (ref >60)
Glucose, Bld: 89 mg/dL (ref 70–99)
Potassium: 4.1 mmol/L (ref 3.5–5.1)
Sodium: 140 mmol/L (ref 135–145)
Total Bilirubin: 0.5 mg/dL (ref 0.3–1.2)
Total Protein: 6.6 g/dL (ref 6.5–8.1)

## 2022-12-25 MED ORDER — SODIUM CHLORIDE 0.9 % IV SOLN
200.0000 mg | Freq: Once | INTRAVENOUS | Status: AC
  Administered 2022-12-25: 200 mg via INTRAVENOUS
  Filled 2022-12-25: qty 200

## 2022-12-25 MED ORDER — SODIUM CHLORIDE 0.9 % IV SOLN: Freq: Once | INTRAVENOUS | Status: AC

## 2022-12-25 NOTE — Patient Instructions (Signed)
[facility]  Discharge Instructions: Thank you for choosing [facility] to provide your oncology and hematology care.   If you have a lab appointment with the Cancer Center, please go directly to the Cancer Center and check in at the registration area.   Wear comfortable clothing and clothing appropriate for easy access to any Portacath or PICC line.   We strive to give you quality time with your provider. You may need to reschedule your appointment if you arrive late (15 or more minutes).  Arriving late affects you and other patients whose appointments are after yours.  Also, if you miss three or more appointments without notifying the office, you may be dismissed from the clinic at the provider's discretion.      For prescription refill requests, have your pharmacy contact our office and allow 72 hours for refills to be completed.    Today you received the following chemotherapy and/or immunotherapy agents keytruda      To help prevent nausea and vomiting after your treatment, we encourage you to take your nausea medication as directed.  BELOW ARE SYMPTOMS THAT SHOULD BE REPORTED IMMEDIATELY: *FEVER GREATER THAN 100.4 F (38 C) OR HIGHER *CHILLS OR SWEATING *NAUSEA AND VOMITING THAT IS NOT CONTROLLED WITH YOUR NAUSEA MEDICATION *UNUSUAL SHORTNESS OF BREATH *UNUSUAL BRUISING OR BLEEDING *URINARY PROBLEMS (pain or burning when urinating, or frequent urination) *BOWEL PROBLEMS (unusual diarrhea, constipation, pain near the anus) TENDERNESS IN MOUTH AND THROAT WITH OR WITHOUT PRESENCE OF ULCERS (sore throat, sores in mouth, or a toothache) UNUSUAL RASH, SWELLING OR PAIN  UNUSUAL VAGINAL DISCHARGE OR ITCHING   Items with * indicate a potential emergency and should be followed up as soon as possible or go to the Emergency Department if any problems should occur.  Please show the CHEMOTHERAPY ALERT CARD or IMMUNOTHERAPY ALERT CARD at  check-in to the Emergency Department and triage nurse.  Should you have questions after your visit or need to cancel or reschedule your appointment, please contact [facility]  Dept: [phone]  and follow the prompts.  Office hours are 8:00 a.m. to 4:30 p.m. Monday - Friday. Please note that voicemails left after 4:00 p.m. may not be returned until the following business day.  We are closed weekends and major holidays. You have access to a nurse at all times for urgent questions. Please call the main number to the clinic Dept: [phone] and follow the prompts.   For any non-urgent questions, you may also contact your provider using MyChart. We now offer e-Visits for anyone 18 and older to request care online for non-urgent symptoms. For details visit [email or web address].   Also download the MyChart app! Go to the app store, search "MyChart", open the app, select , and log in with your MyChart username and password.   

## 2022-12-27 ENCOUNTER — Other Ambulatory Visit: Payer: Self-pay | Admitting: Physician Assistant

## 2023-01-08 ENCOUNTER — Ambulatory Visit (HOSPITAL_COMMUNITY)
Admission: RE | Admit: 2023-01-08 | Discharge: 2023-01-08 | Disposition: A | Discharge status: Home / Self Care | Payer: Medicare HMO | Source: Ambulatory Visit | Attending: Physician Assistant | Admitting: Physician Assistant

## 2023-01-08 DIAGNOSIS — C7951 Secondary malignant neoplasm of bone: Secondary | ICD-10-CM | POA: Diagnosis present

## 2023-01-08 DIAGNOSIS — C3411 Malignant neoplasm of upper lobe, right bronchus or lung: Secondary | ICD-10-CM | POA: Insufficient documentation

## 2023-01-15 ENCOUNTER — Encounter: Payer: Self-pay | Admitting: Medical Oncology

## 2023-01-15 ENCOUNTER — Inpatient Hospital Stay: Payer: Medicare HMO

## 2023-01-15 ENCOUNTER — Inpatient Hospital Stay: Payer: Medicare HMO | Attending: Physician Assistant

## 2023-01-15 ENCOUNTER — Inpatient Hospital Stay (HOSPITAL_BASED_OUTPATIENT_CLINIC_OR_DEPARTMENT_OTHER): Payer: Medicare HMO | Admitting: Internal Medicine

## 2023-01-15 VITALS — BP 109/76 | HR 70 | Temp 97.9°F | Resp 16 | Ht 66.0 in | Wt 149.8 lb

## 2023-01-15 VITALS — BP 111/51 | HR 63 | Resp 16

## 2023-01-15 DIAGNOSIS — C3411 Malignant neoplasm of upper lobe, right bronchus or lung: Secondary | ICD-10-CM | POA: Insufficient documentation

## 2023-01-15 DIAGNOSIS — G8929 Other chronic pain: Secondary | ICD-10-CM | POA: Diagnosis not present

## 2023-01-15 DIAGNOSIS — Z5112 Encounter for antineoplastic immunotherapy: Secondary | ICD-10-CM | POA: Insufficient documentation

## 2023-01-15 DIAGNOSIS — Z902 Acquired absence of lung [part of]: Secondary | ICD-10-CM | POA: Diagnosis not present

## 2023-01-15 DIAGNOSIS — C7951 Secondary malignant neoplasm of bone: Secondary | ICD-10-CM | POA: Insufficient documentation

## 2023-01-15 DIAGNOSIS — Z79620 Long term (current) use of immunosuppressive biologic: Secondary | ICD-10-CM | POA: Insufficient documentation

## 2023-01-15 DIAGNOSIS — Z1509 Genetic susceptibility to other malignant neoplasm: Secondary | ICD-10-CM | POA: Diagnosis not present

## 2023-01-15 LAB — CBC WITH DIFFERENTIAL (CANCER CENTER ONLY)
Abs Immature Granulocytes: 0.02 10*3/uL (ref 0.00–0.07)
Basophils Absolute: 0.1 10*3/uL (ref 0.0–0.1)
Basophils Relative: 1 %
Eosinophils Absolute: 0.9 10*3/uL — ABNORMAL HIGH (ref 0.0–0.5)
Eosinophils Relative: 11 %
HCT: 36.1 % (ref 36.0–46.0)
Hemoglobin: 11.4 g/dL — ABNORMAL LOW (ref 12.0–15.0)
Immature Granulocytes: 0 %
Lymphocytes Relative: 25 %
Lymphs Abs: 2.2 10*3/uL (ref 0.7–4.0)
MCH: 28.9 pg (ref 26.0–34.0)
MCHC: 31.6 g/dL (ref 30.0–36.0)
MCV: 91.6 fL (ref 80.0–100.0)
Monocytes Absolute: 0.7 10*3/uL (ref 0.1–1.0)
Monocytes Relative: 8 %
Neutro Abs: 4.9 10*3/uL (ref 1.7–7.7)
Neutrophils Relative %: 55 %
Platelet Count: 186 10*3/uL (ref 150–400)
RBC: 3.94 MIL/uL (ref 3.87–5.11)
RDW: 13.5 % (ref 11.5–15.5)
WBC Count: 8.7 10*3/uL (ref 4.0–10.5)
nRBC: 0 % (ref 0.0–0.2)

## 2023-01-15 LAB — CMP (CANCER CENTER ONLY)
ALT: 15 U/L (ref 0–44)
AST: 29 U/L (ref 15–41)
Albumin: 3.6 g/dL (ref 3.5–5.0)
Alkaline Phosphatase: 88 U/L (ref 38–126)
Anion gap: 6 (ref 5–15)
BUN: 22 mg/dL (ref 8–23)
CO2: 27 mmol/L (ref 22–32)
Calcium: 9.3 mg/dL (ref 8.9–10.3)
Chloride: 108 mmol/L (ref 98–111)
Creatinine: 1.6 mg/dL — ABNORMAL HIGH (ref 0.44–1.00)
GFR, Estimated: 34 mL/min — ABNORMAL LOW (ref >60)
Glucose, Bld: 96 mg/dL (ref 70–99)
Potassium: 4.4 mmol/L (ref 3.5–5.1)
Sodium: 141 mmol/L (ref 135–145)
Total Bilirubin: 0.5 mg/dL (ref 0.3–1.2)
Total Protein: 7.1 g/dL (ref 6.5–8.1)

## 2023-01-15 MED ORDER — SODIUM CHLORIDE 0.9 % IV SOLN: Freq: Once | INTRAVENOUS | Status: AC

## 2023-01-15 MED ORDER — SODIUM CHLORIDE 0.9 % IV SOLN
200.0000 mg | Freq: Once | INTRAVENOUS | Status: AC
  Administered 2023-01-15: 200 mg via INTRAVENOUS
  Filled 2023-01-15: qty 200

## 2023-01-15 NOTE — Progress Notes (Signed)
Patient seen by Dr. Gypsy Balsam are within treatment parameters.  Labs reviewed: and are not all within treatment parameters. Creatinine = 1.60  Per Dr. Arbutus Ped ,it is ok to treat pt today  with [facility] and creatinine =1.60  Per physician team, patient is ready for treatment and there are NO modifications to the treatment plan.

## 2023-01-15 NOTE — Patient Instructions (Signed)

## 2023-01-15 NOTE — Progress Notes (Signed)
[facility] [phone]   [phone]  OFFICE PROGRESS NOTE  Brandi Able, MD [address] [place] [address]  DIAGNOSIS: Metastatic non-small cell lung cancer, adenocarcinoma that was initially diagnosed as stage IIb (T1b, N1, M0) non-small cell lung cancer, adenocarcinoma presented with right upper lobe lung nodule  Detected Alteration(s) / Biomarker(s) Associated FDA-approved therapies Clinical Trial Availability % cfDNA or Amplification BRAF V600E approved by FDA Dabrafenib+trametinib, Encorafenib+binimetinib approved in other indication Cobimetinib, Dabrafenib, Trametinib, Vemurafenib, Vemurafenib+cobimetinib Yes 8.3%  BRCA1 R252S None (VUS) None (VUS) 0.1%   SMAD4 D438fs None None 7.2%  PD-L1 expression 35%  PRIOR THERAPY:  1) Status post right upper lobectomy with lymph node dissection on September 30, 2019 in [place].  She declines adjuvant systemic chemotherapy. 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 Mitzi Hansen.  CURRENT THERAPY: Systemic chemotherapy with carboplatin for AUC of 5, Alimta 500 Mg/M2 and Keytruda 200 Mg IV every 3 weeks.  First dose April 16, 2022.  Status post 13 cycles.  Starting cycle #5 she will be on maintenance treatment with Keytruda every 3 weeks.  INTERVAL HISTORY: Brandi Holmes 72 y.o. female returns to the clinic today for follow-up visit.Discussed the use of AI scribe software for clinical note transcription with the patient, who gave verbal consent to proceed.  History of Present Illness   The patient, a 72 year old black female initially diagnosed as stage 2B non-small cell lung cancer, s/p right upper lobectomy in [place] in June 2021. She had evidence of Metastatic bone disease in 03/2022 and has been on a treatment regimen of Alimta and Keytruda, currently only taking  Keytruda. She has completed thirteen cycles of this treatment, with the last treatment administered three weeks prior to the consultation. Since the last treatment, the patient reports feeling much better, with pain management effectively controlled by Lyrica and Tramadol.  Previously, the patient experienced nerve pain through the torso, which was particularly pronounced when walking. This pain was so severe that it limited her ability to walk even short distances without leaning over. However, this pain has improved, and the patient has been making efforts to walk upright, albeit with frequent rests.  The patient also reports discomfort in the anterior torso area, which is distinct from the back pain previously experienced. This discomfort is not constant but is triggered when she attempts to walk upright. The patient does not use any mobility aids at home, such as a wheelchair or walker, and is making efforts to maintain strength and mobility.  The patient's medication regimen includes Tramadol 100mg  every six hours as needed, and Lyrica 150mg  twice a day, both managed by her primary physician. She also uses eye drops prescribed by her eye doctor. The patient has noted some confusion regarding the Tramadol dosage instructions and has clarified that she does not take it more than once every six hours due to its strength.  The patient has lost thirty pounds following radiation treatment. She also reports tenderness in the areas where surgical incisions were made for tube placements, despite these surgeries occurring three years prior. The patient does not report any nausea, vomiting, diarrhea, or weight loss.       MEDICAL HISTORY: Past Medical History:  Diagnosis Date   Anemia    as a child only   Coronary artery calcification of native artery    Hyperlipidemia  Hypertension    Liver spots    " per pt"   Lung nodule    Wears glasses    Wears partial dentures     ALLERGIES:  is allergic  to crab [shellfish allergy] and morphine and codeine.  MEDICATIONS:  Current Outpatient Medications  Medication Sig Dispense Refill   aspirin EC 81 MG tablet Take 81 mg by mouth at bedtime. Swallow whole.     atorvastatin (LIPITOR) 20 MG tablet Take 20 mg by mouth at bedtime.     folic acid (FOLVITE) 1 MG tablet TAKE ONE TABLET BY MOUTH EVERY DAY 30 tablet 0   furosemide (LASIX) 20 MG tablet Take 20 mg by mouth daily.     lactose free nutrition (BOOST) LIQD Take 237 mLs by mouth 2 (two) times daily between meals.     lidocaine (LIDODERM) 5 % Place 1 patch onto the skin daily. Remove & Discard patch within 12 hours or as directed by MD (Patient not taking: Reported on 12/25/2022) 30 patch 0   lisinopril (ZESTRIL) 10 MG tablet Take 1 tablet (10 mg total) by mouth every morning. 90 tablet 0   magnesium citrate SOLN Take 1 Bottle by mouth once a week.     OVER THE COUNTER MEDICATION "Prune X" -2-3 x week     pregabalin (LYRICA) 150 MG capsule Take 150 mg by mouth 2 (two) times daily.     prochlorperazine (COMPAZINE) 10 MG tablet Take 1 tablet (10 mg total) by mouth every 6 (six) hours as needed for nausea or vomiting. (Patient not taking: Reported on 12/25/2022) 30 tablet 0   tiZANidine (ZANAFLEX) 2 MG tablet Take 2 mg by mouth at bedtime.     traMADol HCl 100 MG TABS Take 1 tablet by mouth in the morning, at noon, and at bedtime.     No current facility-administered medications for this visit.    SURGICAL HISTORY:  Past Surgical History:  Procedure Laterality Date   ABDOMINAL HYSTERECTOMY  1974   heavy bleeding   ABDOMINAL HYSTERECTOMY     BRONCHIAL BRUSHINGS  08/13/2019   Procedure: BRONCHIAL BRUSHINGS;  Surgeon: Josephine Igo, DO;  Location: [facility];  Service: Pulmonary;;   BRONCHIAL WASHINGS  08/13/2019   Procedure: BRONCHIAL WASHINGS;  Surgeon: Josephine Igo, DO;  Location: [facility];  Service: Pulmonary;;   COLONOSCOPY W/ BIOPSIES AND POLYPECTOMY     DILATION AND  CURETTAGE OF UTERUS     FINE NEEDLE ASPIRATION  08/13/2019   Procedure: FINE NEEDLE ASPIRATION (FNA) LINEAR;  Surgeon: Josephine Igo, DO;  Location: [facility];  Service: Pulmonary;;   FOOT SURGERY     bilateral bunions and hammer toes   IR RADIOLOGIST EVAL & MGMT  05/20/2022   LUNG BIOPSY  08/13/2019   Procedure: LUNG BIOPSY;  Surgeon: Josephine Igo, DO;  Location: [facility];  Service: Pulmonary;;   MULTIPLE TOOTH EXTRACTIONS     VIDEO BRONCHOSCOPY WITH ENDOBRONCHIAL NAVIGATION Right 08/13/2019   Procedure: VIDEO BRONCHOSCOPY WITH ENDOBRONCHIAL NAVIGATION;  Surgeon: Josephine Igo, DO;  Location: [facility];  Service: Pulmonary;  Laterality: Right;   VIDEO BRONCHOSCOPY WITH ENDOBRONCHIAL ULTRASOUND Right 08/13/2019   Procedure: VIDEO BRONCHOSCOPY WITH ENDOBRONCHIAL ULTRASOUND;  Surgeon: Josephine Igo, DO;  Location: [facility];  Service: Pulmonary;  Laterality: Right;    REVIEW OF SYSTEMS:  Constitutional: positive for fatigue Eyes: negative Ears, nose, mouth, throat, and face: negative Respiratory: positive for dyspnea on exertion Cardiovascular: negative Gastrointestinal: negative Genitourinary:negative Integument/breast: negative Hematologic/lymphatic:  negative Musculoskeletal:positive for back pain Neurological: negative Behavioral/Psych: negative Endocrine: negative Allergic/Immunologic: negative   PHYSICAL EXAMINATION: General appearance: alert, cooperative, fatigued, and no distress Head: Normocephalic, without obvious abnormality, atraumatic Neck: no adenopathy, no JVD, supple, symmetrical, trachea midline, and thyroid not enlarged, symmetric, no tenderness/mass/nodules Lymph nodes: Cervical, supraclavicular, and axillary nodes normal. Resp: clear to auscultation bilaterally Back: symmetric, no curvature. ROM normal. No CVA tenderness. Cardio: regular rate and rhythm, S1, S2 normal, no murmur, click, rub or gallop GI: soft, non-tender; bowel sounds normal;  no masses,  no organomegaly Extremities: extremities normal, atraumatic, no cyanosis or edema Neurologic: Alert and oriented X 3, normal strength and tone. Normal symmetric reflexes. Normal coordination and gait  ECOG PERFORMANCE STATUS: 1 - Symptomatic but completely ambulatory  Blood pressure 109/76, pulse 70, temperature 97.9 F (36.6 C), temperature source Oral, resp. rate 16, height 5\' 6"  (1.676 m), weight 149 lb 12.8 oz (67.9 kg), SpO2 100%.    LABORATORY DATA: Lab Results  Component Value Date   WBC 8.7 01/15/2023   HGB 11.4 (L) 01/15/2023   HCT 36.1 01/15/2023   MCV 91.6 01/15/2023   PLT 186 01/15/2023      Chemistry      Component Value Date/Time   NA 140 12/25/2022 1047   NA 142 07/22/2019 1522   K 4.1 12/25/2022 1047   CL 107 12/25/2022 1047   CO2 28 12/25/2022 1047   BUN 22 12/25/2022 1047   BUN 13 07/22/2019 1522   CREATININE 1.44 (H) 12/25/2022 1047   CREATININE 0.91 09/16/2011 1000      Component Value Date/Time   CALCIUM 9.1 12/25/2022 1047   ALKPHOS 74 12/25/2022 1047   AST 32 12/25/2022 1047   ALT 19 12/25/2022 1047   BILITOT 0.5 12/25/2022 1047       RADIOGRAPHIC STUDIES: CT CHEST ABDOMEN PELVIS WO CONTRAST  Result Date: 01/15/2023 CLINICAL DATA:  Restaging metastatic non-small cell lung cancer * Tracking Code: BO * EXAM: CT CHEST, ABDOMEN AND PELVIS WITHOUT CONTRAST TECHNIQUE: Multidetector CT imaging of the chest, abdomen and pelvis was performed following the standard protocol without IV contrast. RADIATION DOSE REDUCTION: This exam was performed according to the departmental dose-optimization program which includes automated exposure control, adjustment of the mA and/or kV according to patient size and/or use of iterative reconstruction technique. COMPARISON:  11/11/2022 FINDINGS: CT CHEST FINDINGS Cardiovascular: Coronary, aortic arch, and branch vessel atherosclerotic vascular disease. Large pericardial effusion similar prior. Mediastinum/Nodes:  Right lower paratracheal lymph node 0.9 cm in short axis on image 22 series 2, formerly 0.8 cm. Lungs/Pleura: Right upper lobectomy. Chronic paramediastinal scarring likely therapy related. Posterior subpleural region of ground-glass opacity and volume loss with thickened reticulation and some associated medial consolidation primarily posteriorly in the right lower lobe, degree of associated opacity mildly increased from prior although similar distribution. Some of this may be radiation therapy related given the nearby osseous metastatic disease. Mild subpleural reticulation in the left lung compatible with mild chronic fibrosis. Left lower lobe paramediastinal volume loss compatible with prior radiation therapy. Musculoskeletal: Overall similar distribution of osseous metastatic disease including the T1, T2, T3, T8, T9, and T10 levels. Stable endplate compression fractures at T7-8 and stable superior endplate compression fracture T11. Stable compressions at T9, T10, and T1 with stable posterior bony retropulsion of the T1 vertebral level. Degenerative glenohumeral arthropathy, right greater than left. Chronic deformities with expansion and evidence of prior metastatic disease in the right ninth and tenth ribs. CT ABDOMEN PELVIS FINDINGS Hepatobiliary: Stable  scattered hypodense liver lesions favoring cysts. Gallbladder unremarkable. Pancreas: Unremarkable Spleen: Unremarkable Adrenals/Urinary Tract: Fullness of the left adrenal gland as before without a well-defined mass. Dystrophic calcification in the right kidney lower pole, the underlying hypoenhancing lesion shown on 11/11/2022 is not well appreciated on today's noncontrast CT, probably attributable to the lack of IV contrast. Stomach/Bowel: Prominent stool throughout the colon favors constipation. Vascular/Lymphatic: Atherosclerosis is present, including aortoiliac atherosclerotic disease. Reproductive: Uterus absent.  Adnexa unremarkable. Other: No  supplemental non-categorized findings. Musculoskeletal: Sclerotic lesions compatible with prior metastatic disease in the left iliac crest and in the L3, L4, and L5 vertebra, distribution not changed from 11/11/2022. Lower lumbar spondylosis and degenerative disc disease causing mild impingement at L3-4 and L4-5, along with prominent right and mild left foraminal stenosis at L5-S1. IMPRESSION: 1. Stable distribution of sclerotic osseous metastatic disease, with no progression or change. 2. Stable large pericardial effusion. 3. Mildly increased subpleural opacity posteriorly in the right lower lobe, probably radiation therapy related given the adjacent osseous metastatic lesions. 4. Aortic and systemic atherosclerosis. 5. Prominent stool throughout the colon favors constipation. 6. Lower lumbar impingement at L3-4, L4-5, and L5-S1. Aortic Atherosclerosis (ICD10-I70.0). Electronically Signed   By: Gaylyn Rong M.D.   On: 01/15/2023 11:18     ASSESSMENT AND PLAN: This is a pleasant 72 years old African-American female diagnosed with metastatic also lung cancer, adenocarcinoma initially diagnosed as stage IIb (T1b, N1, M0) non-small cell lung cancer, adenocarcinoma status post a right upper lobectomy with lymph node dissection on September 30, 2019 in [place].  She declined adjuvant systemic chemotherapy at that time. She was found to have evidence for disease metastasis in November 2023 presenting with multiple metastatic bone lesions. The patient had molecular studies that showed an actionable mutation with BRAF V600E mutation and PD-L1 expression of 35%. She underwent palliative radiotherapy to the T9 vertebral lesion under the care of Dr. Mitzi Hansen. The patient is currently undergoing first-line combination of systemic chemotherapy with carboplatin for AUC of 5, Alimta 500 Mg/M2 and Keytruda 200 Mg IV every 3 weeks.  Status post 13 cycles.  Starting from cycle #5 she will be on maintenance treatment with Alimta  and Keytruda every 3 weeks.  Starting from cycle #5 she will be on single agent Keytruda only.  Alimta will be discontinued secondary to renal insufficiency. She had repeat CT scan of the chest, abdomen and pelvis performed recently.  I personally and independently reviewed the scan images and discussed the result with the patient today. Her scan showed no concerning findings for disease progression. Assessment and Plan    Metastatic non-small cell lung cancer, adenocarcinoma that was initially diagnosed as stage IIb (T1b, N1, M0) non-small cell lung cancer, adenocarcinoma presented with right upper lobe lung nodule in 09/2019 with evidence of bone metastases in 03/2022. CT scan of chest, abdomen, and pelvis: No evidence of cancer progression; stable disease (01/08/2023) Stable disease on Keytruda with 13 cycles completed. BRAF V600E mutation present, offering potential for future oral therapy if disease progression occurs. -Continue Keytruda every three weeks.  Chronic Pain Improved pain management with Lyrica and Tramadol. Pain primarily localized to the torso, with some residual tenderness at previous incision sites. -Continue Lyrica 150mg  twice daily. -Continue Tramadol 100mg  every six hours as needed.  Eye Condition New eye drops added by ophthalmologist. -Add new eye drops to medication regimen.  Follow-up No new symptoms of nausea, vomiting, diarrhea, or weight loss. -Return in three weeks for cycle 15 of Keytruda treatment.  The patient was advised to call immediately if she has any other concerning symptoms in the interval.  The patient voices understanding of current disease status and treatment options and is in agreement with the current care plan. The total time spent in the appointment was 30 minutes.  All questions were answered. The patient knows to call the clinic with any problems, questions or concerns. We can certainly see the patient much sooner if  necessary.  Disclaimer: This note was dictated with voice recognition software. Similar sounding words can inadvertently be transcribed and may not be corrected upon review.

## 2023-01-28 ENCOUNTER — Other Ambulatory Visit: Payer: Self-pay | Admitting: Physician Assistant

## 2023-02-05 ENCOUNTER — Inpatient Hospital Stay (HOSPITAL_BASED_OUTPATIENT_CLINIC_OR_DEPARTMENT_OTHER): Payer: Medicare HMO | Admitting: Internal Medicine

## 2023-02-05 ENCOUNTER — Inpatient Hospital Stay: Payer: Medicare HMO

## 2023-02-05 ENCOUNTER — Encounter: Payer: Self-pay | Admitting: Medical Oncology

## 2023-02-05 ENCOUNTER — Other Ambulatory Visit: Payer: Self-pay

## 2023-02-05 VITALS — BP 122/62 | HR 70

## 2023-02-05 DIAGNOSIS — C7951 Secondary malignant neoplasm of bone: Secondary | ICD-10-CM

## 2023-02-05 DIAGNOSIS — Z5112 Encounter for antineoplastic immunotherapy: Secondary | ICD-10-CM | POA: Diagnosis not present

## 2023-02-05 LAB — CMP (CANCER CENTER ONLY)
ALT: 20 U/L (ref 0–44)
AST: 32 U/L (ref 15–41)
Albumin: 3.6 g/dL (ref 3.5–5.0)
Alkaline Phosphatase: 83 U/L (ref 38–126)
Anion gap: 4 — ABNORMAL LOW (ref 5–15)
BUN: 25 mg/dL — ABNORMAL HIGH (ref 8–23)
CO2: 28 mmol/L (ref 22–32)
Calcium: 9.1 mg/dL (ref 8.9–10.3)
Chloride: 107 mmol/L (ref 98–111)
Creatinine: 1.57 mg/dL — ABNORMAL HIGH (ref 0.44–1.00)
GFR, Estimated: 34 mL/min — ABNORMAL LOW (ref >60)
Glucose, Bld: 101 mg/dL — ABNORMAL HIGH (ref 70–99)
Potassium: 3.9 mmol/L (ref 3.5–5.1)
Sodium: 139 mmol/L (ref 135–145)
Total Bilirubin: 0.6 mg/dL (ref 0.3–1.2)
Total Protein: 6.9 g/dL (ref 6.5–8.1)

## 2023-02-05 LAB — CBC WITH DIFFERENTIAL (CANCER CENTER ONLY)
Abs Immature Granulocytes: 0.03 10*3/uL (ref 0.00–0.07)
Basophils Absolute: 0.1 10*3/uL (ref 0.0–0.1)
Basophils Relative: 1 %
Eosinophils Absolute: 2.3 10*3/uL — ABNORMAL HIGH (ref 0.0–0.5)
Eosinophils Relative: 25 %
HCT: 36.2 % (ref 36.0–46.0)
Hemoglobin: 11.8 g/dL — ABNORMAL LOW (ref 12.0–15.0)
Immature Granulocytes: 0 %
Lymphocytes Relative: 27 %
Lymphs Abs: 2.4 10*3/uL (ref 0.7–4.0)
MCH: 29 pg (ref 26.0–34.0)
MCHC: 32.6 g/dL (ref 30.0–36.0)
MCV: 88.9 fL (ref 80.0–100.0)
Monocytes Absolute: 0.7 10*3/uL (ref 0.1–1.0)
Monocytes Relative: 8 %
Neutro Abs: 3.4 10*3/uL (ref 1.7–7.7)
Neutrophils Relative %: 39 %
Platelet Count: 176 10*3/uL (ref 150–400)
RBC: 4.07 MIL/uL (ref 3.87–5.11)
RDW: 14 % (ref 11.5–15.5)
WBC Count: 8.9 10*3/uL (ref 4.0–10.5)
nRBC: 0 % (ref 0.0–0.2)

## 2023-02-05 LAB — TSH: TSH: 0.821 u[IU]/mL (ref 0.350–4.500)

## 2023-02-05 MED ORDER — SODIUM CHLORIDE 0.9 % IV SOLN: Freq: Once | INTRAVENOUS | Status: AC

## 2023-02-05 MED ORDER — SODIUM CHLORIDE 0.9 % IV SOLN
200.0000 mg | Freq: Once | INTRAVENOUS | Status: AC
Start: 2023-02-05 — End: 2023-02-05
  Administered 2023-02-05: 200 mg via INTRAVENOUS
  Filled 2023-02-05: qty 200

## 2023-02-05 NOTE — Patient Instructions (Signed)
Brandi Holmes  Discharge Instructions: Thank you for choosing [facility] to provide your oncology and hematology care.   If you have a lab appointment with the [facility], please go directly to the [facility] and check in at the registration area.   Wear comfortable clothing and clothing appropriate for easy access to any Portacath or PICC line.   We strive to give you quality time with your provider. You may need to reschedule your appointment if you arrive late (15 or more minutes).  Arriving late affects you and other patients whose appointments are after yours.  Also, if you miss three or more appointments without notifying the office, you may be dismissed from the clinic at the provider's discretion.      For prescription refill requests, have your pharmacy contact our office and allow 72 hours for refills to be completed.    Today you received the following chemotherapy and/or immunotherapy agents: Keytruda      To help prevent nausea and vomiting after your treatment, we encourage you to take your nausea medication as directed.  BELOW ARE SYMPTOMS THAT SHOULD BE REPORTED IMMEDIATELY: *FEVER GREATER THAN 100.4 F (38 C) OR HIGHER *CHILLS OR SWEATING *NAUSEA AND VOMITING THAT IS NOT CONTROLLED WITH YOUR NAUSEA MEDICATION *UNUSUAL SHORTNESS OF BREATH *UNUSUAL BRUISING OR BLEEDING *URINARY PROBLEMS (pain or burning when urinating, or frequent urination) *BOWEL PROBLEMS (unusual diarrhea, constipation, pain near the anus) TENDERNESS IN MOUTH AND THROAT WITH OR WITHOUT PRESENCE OF ULCERS (sore throat, sores in mouth, or a toothache) UNUSUAL RASH, SWELLING OR PAIN  UNUSUAL VAGINAL DISCHARGE OR ITCHING   Items with * indicate a potential emergency and should be followed up as soon as possible or go to the Emergency Department if any problems should occur.  Please show the CHEMOTHERAPY ALERT CARD or IMMUNOTHERAPY ALERT CARD at  check-in to the Emergency Department and triage nurse.  Should you have questions after your visit or need to cancel or reschedule your appointment, please contact [facility]  Dept: [phone]  and follow the prompts.  Office hours are 8:00 a.m. to 4:30 p.m. Monday - Friday. Please note that voicemails left after 4:00 p.m. may not be returned until the following business day.  We are closed weekends and major holidays. You have access to a nurse at all times for urgent questions. Please call the main number to the clinic Dept: [phone] and follow the prompts.   For any non-urgent questions, you may also contact your provider using MyChart. We now offer e-Visits for anyone 14 and older to request care online for non-urgent symptoms. For details visit [email or web address].   Also download the MyChart app! Go to the app store, search "MyChart", open the app, select [facility], and log in with your MyChart username and password.

## 2023-02-05 NOTE — Progress Notes (Signed)
Patient seen by Dr. Gypsy Balsam are within treatment parameters.  Labs reviewed: and are not all within treatment parameters. Creatinine =1.57 per orders Dr  Arbutus Ped , it is ok to treat pt today with [facility] and creatinine =1.57  Per physician team, patient is ready for treatment and there are NO modifications to the treatment plan.

## 2023-02-05 NOTE — Progress Notes (Signed)
[facility] [phone]   [phone]  OFFICE PROGRESS NOTE  Brandi Able, MD [address] [place] [address]  DIAGNOSIS: Metastatic non-small cell lung cancer, adenocarcinoma that was initially diagnosed as stage IIb (T1b, N1, M0) non-small cell lung cancer, adenocarcinoma presented with right upper lobe lung nodule in June 2021.   Detected Alteration(s) / Biomarker(s) Associated FDA-approved therapies Clinical Trial Availability % cfDNA or Amplification BRAF V600E approved by FDA Dabrafenib+trametinib, Encorafenib+binimetinib approved in other indication Cobimetinib, Dabrafenib, Trametinib, Vemurafenib, Vemurafenib+cobimetinib Yes 8.3%  BRCA1 R252S None (VUS) None (VUS) 0.1%   SMAD4 D453fs None None 7.2%  PD-L1 expression 35%  PRIOR THERAPY:  1) Status post right upper lobectomy with lymph node dissection on September 30, 2019 in [place].  She declines adjuvant systemic chemotherapy. 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 Mitzi Hansen.  CURRENT THERAPY: Systemic chemotherapy with carboplatin for AUC of 5, Alimta 500 Mg/M2 and Keytruda 200 Mg IV every 3 weeks.  First dose April 16, 2022.  Status post 13 cycles.  Starting cycle #5 she will be on maintenance treatment with Keytruda every 3 weeks.  INTERVAL HISTORY: Brandi Holmes 72 y.o. female returns to the clinic today for follow-up visit. Discussed the use of AI scribe software for clinical note transcription with the patient, who gave verbal consent to proceed.  History of Present Illness   The patient, a 72 year old with a history of early-stage lung cancer, initially diagnosed in June 2021, underwent a right upper lobectomy. Unfortunately, in December 2023, they experienced a recurrence of cancer with metastasis to the bone. They received radiation to the  thoracic spine and started systemic chemotherapy. They have completed a total of 14 cycles of chemotherapy, including four cycles of carboplatin, Alimta, and Keytruda, followed by maintenance therapy with Alimta and Keytruda every three weeks.  The patient reports feeling good overall, with the primary complaint being persistent discomfort in the right rib cage. This discomfort is attributed to displaced fractures of the ribs, which are reportedly growing back in a crooked manner. The pain is managed with Tramadol and Lyrica (150 mg twice a day), which the patient reports as being effective in controlling the pain.  The patient denies any side effects from the last chemotherapy cycle, specifically denying nausea or vomiting. They report good breathing, although they note that poor posture can exacerbate shortness of breath. They manage this by correcting their posture and practicing deep breathing exercises.       MEDICAL HISTORY: Past Medical History:  Diagnosis Date   Anemia    as a child only   Coronary artery calcification of native artery    Hyperlipidemia    Hypertension    Liver spots    " per pt"   Lung nodule    Wears glasses    Wears partial dentures     ALLERGIES:  is allergic to crab [shellfish allergy] and morphine and codeine.  MEDICATIONS:  Current Outpatient Medications  Medication Sig Dispense Refill   aspirin EC 81 MG tablet Take 81 mg by mouth at bedtime. Swallow whole.     atorvastatin (LIPITOR) 20 MG tablet Take 20 mg by mouth at bedtime.     folic acid (FOLVITE) 1 MG tablet TAKE ONE TABLET BY MOUTH EVERY DAY 30 tablet 0   furosemide (LASIX) 20 MG tablet Take 20 mg by  mouth daily.     lactose free nutrition (BOOST) LIQD Take 237 mLs by mouth 2 (two) times daily between meals.     latanoprost (XALATAN) 0.005 % ophthalmic solution Place 1 drop into both eyes at bedtime.     lisinopril (ZESTRIL) 10 MG tablet Take 1 tablet (10 mg total) by mouth every morning. 90  tablet 0   magnesium citrate SOLN Take 1 Bottle by mouth once a week.     OVER THE COUNTER MEDICATION "Prune X" -2-3 x week     pregabalin (LYRICA) 150 MG capsule Take 150 mg by mouth 2 (two) times daily.     prochlorperazine (COMPAZINE) 10 MG tablet Take 1 tablet (10 mg total) by mouth every 6 (six) hours as needed for nausea or vomiting. (Patient not taking: Reported on 12/25/2022) 30 tablet 0   tiZANidine (ZANAFLEX) 2 MG tablet Take 2 mg by mouth at bedtime.     traMADol HCl 100 MG TABS Take 1 tablet by mouth QID. 1 tablet by mouth every 6 hours as needed for pain     No current facility-administered medications for this visit.    SURGICAL HISTORY:  Past Surgical History:  Procedure Laterality Date   ABDOMINAL HYSTERECTOMY  1974   heavy bleeding   ABDOMINAL HYSTERECTOMY     BRONCHIAL BRUSHINGS  08/13/2019   Procedure: BRONCHIAL BRUSHINGS;  Surgeon: Josephine Igo, DO;  Location: [facility];  Service: Pulmonary;;   BRONCHIAL WASHINGS  08/13/2019   Procedure: BRONCHIAL WASHINGS;  Surgeon: Josephine Igo, DO;  Location: [facility];  Service: Pulmonary;;   COLONOSCOPY W/ BIOPSIES AND POLYPECTOMY     DILATION AND CURETTAGE OF UTERUS     FINE NEEDLE ASPIRATION  08/13/2019   Procedure: FINE NEEDLE ASPIRATION (FNA) LINEAR;  Surgeon: Josephine Igo, DO;  Location: [facility];  Service: Pulmonary;;   FOOT SURGERY     bilateral bunions and hammer toes   IR RADIOLOGIST EVAL & MGMT  05/20/2022   LUNG BIOPSY  08/13/2019   Procedure: LUNG BIOPSY;  Surgeon: Josephine Igo, DO;  Location: [facility];  Service: Pulmonary;;   MULTIPLE TOOTH EXTRACTIONS     VIDEO BRONCHOSCOPY WITH ENDOBRONCHIAL NAVIGATION Right 08/13/2019   Procedure: VIDEO BRONCHOSCOPY WITH ENDOBRONCHIAL NAVIGATION;  Surgeon: Josephine Igo, DO;  Location: [facility];  Service: Pulmonary;  Laterality: Right;   VIDEO BRONCHOSCOPY WITH ENDOBRONCHIAL ULTRASOUND Right 08/13/2019   Procedure: VIDEO BRONCHOSCOPY WITH  ENDOBRONCHIAL ULTRASOUND;  Surgeon: Josephine Igo, DO;  Location: [facility];  Service: Pulmonary;  Laterality: Right;    REVIEW OF SYSTEMS:  A comprehensive review of systems was negative except for: Respiratory: positive for pleurisy/chest pain   PHYSICAL EXAMINATION: General appearance: alert, cooperative, and no distress Head: Normocephalic, without obvious abnormality, atraumatic Neck: no adenopathy, no JVD, supple, symmetrical, trachea midline, and thyroid not enlarged, symmetric, no tenderness/mass/nodules Lymph nodes: Cervical, supraclavicular, and axillary nodes normal. Resp: clear to auscultation bilaterally Back: symmetric, no curvature. ROM normal. No CVA tenderness. Cardio: regular rate and rhythm, S1, S2 normal, no murmur, click, rub or gallop GI: soft, non-tender; bowel sounds normal; no masses,  no organomegaly Extremities: extremities normal, atraumatic, no cyanosis or edema  ECOG PERFORMANCE STATUS: 1 - Symptomatic but completely ambulatory  Blood pressure (!) 154/91, pulse 67, temperature 97.8 F (36.6 C), temperature source Oral, resp. rate 17, height 5\' 6"  (1.676 m), weight 149 lb 8 oz (67.8 kg), SpO2 100%.    LABORATORY DATA: Lab Results  Component Value Date  WBC 8.9 02/05/2023   HGB 11.8 (L) 02/05/2023   HCT 36.2 02/05/2023   MCV 88.9 02/05/2023   PLT 176 02/05/2023      Chemistry      Component Value Date/Time   NA 139 02/05/2023 1041   NA 142 07/22/2019 1522   K 3.9 02/05/2023 1041   CL 107 02/05/2023 1041   CO2 28 02/05/2023 1041   BUN 25 (H) 02/05/2023 1041   BUN 13 07/22/2019 1522   CREATININE 1.57 (H) 02/05/2023 1041   CREATININE 0.91 09/16/2011 1000      Component Value Date/Time   CALCIUM 9.1 02/05/2023 1041   ALKPHOS 83 02/05/2023 1041   AST 32 02/05/2023 1041   ALT 20 02/05/2023 1041   BILITOT 0.6 02/05/2023 1041       RADIOGRAPHIC STUDIES: CT CHEST ABDOMEN PELVIS WO CONTRAST  Result Date: 01/15/2023 CLINICAL DATA:   Restaging metastatic non-small cell lung cancer * Tracking Code: BO * EXAM: CT CHEST, ABDOMEN AND PELVIS WITHOUT CONTRAST TECHNIQUE: Multidetector CT imaging of the chest, abdomen and pelvis was performed following the standard protocol without IV contrast. RADIATION DOSE REDUCTION: This exam was performed according to the departmental dose-optimization program which includes automated exposure control, adjustment of the mA and/or kV according to patient size and/or use of iterative reconstruction technique. COMPARISON:  11/11/2022 FINDINGS: CT CHEST FINDINGS Cardiovascular: Coronary, aortic arch, and branch vessel atherosclerotic vascular disease. Large pericardial effusion similar prior. Mediastinum/Nodes: Right lower paratracheal lymph node 0.9 cm in short axis on image 22 series 2, formerly 0.8 cm. Lungs/Pleura: Right upper lobectomy. Chronic paramediastinal scarring likely therapy related. Posterior subpleural region of ground-glass opacity and volume loss with thickened reticulation and some associated medial consolidation primarily posteriorly in the right lower lobe, degree of associated opacity mildly increased from prior although similar distribution. Some of this may be radiation therapy related given the nearby osseous metastatic disease. Mild subpleural reticulation in the left lung compatible with mild chronic fibrosis. Left lower lobe paramediastinal volume loss compatible with prior radiation therapy. Musculoskeletal: Overall similar distribution of osseous metastatic disease including the T1, T2, T3, T8, T9, and T10 levels. Stable endplate compression fractures at T7-8 and stable superior endplate compression fracture T11. Stable compressions at T9, T10, and T1 with stable posterior bony retropulsion of the T1 vertebral level. Degenerative glenohumeral arthropathy, right greater than left. Chronic deformities with expansion and evidence of prior metastatic disease in the right ninth and tenth ribs.  CT ABDOMEN PELVIS FINDINGS Hepatobiliary: Stable scattered hypodense liver lesions favoring cysts. Gallbladder unremarkable. Pancreas: Unremarkable Spleen: Unremarkable Adrenals/Urinary Tract: Fullness of the left adrenal gland as before without a well-defined mass. Dystrophic calcification in the right kidney lower pole, the underlying hypoenhancing lesion shown on 11/11/2022 is not well appreciated on today's noncontrast CT, probably attributable to the lack of IV contrast. Stomach/Bowel: Prominent stool throughout the colon favors constipation. Vascular/Lymphatic: Atherosclerosis is present, including aortoiliac atherosclerotic disease. Reproductive: Uterus absent.  Adnexa unremarkable. Other: No supplemental non-categorized findings. Musculoskeletal: Sclerotic lesions compatible with prior metastatic disease in the left iliac crest and in the L3, L4, and L5 vertebra, distribution not changed from 11/11/2022. Lower lumbar spondylosis and degenerative disc disease causing mild impingement at L3-4 and L4-5, along with prominent right and mild left foraminal stenosis at L5-S1. IMPRESSION: 1. Stable distribution of sclerotic osseous metastatic disease, with no progression or change. 2. Stable large pericardial effusion. 3. Mildly increased subpleural opacity posteriorly in the right lower lobe, probably radiation therapy related given the adjacent  osseous metastatic lesions. 4. Aortic and systemic atherosclerosis. 5. Prominent stool throughout the colon favors constipation. 6. Lower lumbar impingement at L3-4, L4-5, and L5-S1. Aortic Atherosclerosis (ICD10-I70.0). Electronically Signed   By: Gaylyn Rong M.D.   On: 01/15/2023 11:18     ASSESSMENT AND PLAN: This is a pleasant 72 years old African-American female diagnosed with metastatic also lung cancer, adenocarcinoma initially diagnosed as stage IIb (T1b, N1, M0) non-small cell lung cancer, adenocarcinoma status post a right upper lobectomy with lymph node  dissection on September 30, 2019 in [place].  She declined adjuvant systemic chemotherapy at that time. She was found to have evidence for disease metastasis in November 2023 presenting with multiple metastatic bone lesions. The patient had molecular studies that showed an actionable mutation with BRAF V600E mutation and PD-L1 expression of 35%. She underwent palliative radiotherapy to the T9 vertebral lesion under the care of Dr. Mitzi Hansen. The patient is currently undergoing first-line combination of systemic chemotherapy with carboplatin for AUC of 5, Alimta 500 Mg/M2 and Keytruda 200 Mg IV every 3 weeks.  Status post 13 cycles.  Starting from cycle #5 she will be on maintenance treatment with Alimta and Keytruda every 3 weeks.  Starting from cycle #5 she will be on single agent Keytruda only.  Alimta will be discontinued secondary to renal insufficiency.  Metastatic Lung Cancer Initially diagnosed with early stage lung cancer in June 2021, underwent right upper lobectomy. Recurrence with bone metastasis in December 2023. Received radiation to thoracic spine and systemic chemotherapy (Carboplatin, Alimta, Keytruda). Currently on maintenance Keytruda. Tolerating treatment well with no reported side effects. -Continue Keytruda every three weeks. -Plan for cycle 15 today. -Plan for imaging after next cycle.  Chronic Pain Displaced rib fractures causing chronic pain. Pain is managed with Tramadol and Lyrica 150mg  twice daily. -Continue current pain management regimen.  Follow-up in three weeks. She was advised to call immediately if she has any other concerning symptoms in the interval. The patient voices understanding of current disease status and treatment options and is in agreement with the current care plan. The total time spent in the appointment was 20 minutes.  All questions were answered. The patient knows to call the clinic with any problems, questions or concerns. We can certainly see the patient  much sooner if necessary.  Disclaimer: This note was dictated with voice recognition software. Similar sounding words can inadvertently be transcribed and may not be corrected upon review.

## 2023-02-06 LAB — T4: T4, Total: 5.9 ug/dL (ref 4.5–12.0)

## 2023-02-13 ENCOUNTER — Other Ambulatory Visit: Payer: Self-pay | Admitting: Physician Assistant

## 2023-02-26 ENCOUNTER — Ambulatory Visit: Payer: Medicare HMO | Admitting: Internal Medicine

## 2023-02-26 ENCOUNTER — Inpatient Hospital Stay: Payer: Medicare HMO

## 2023-02-26 ENCOUNTER — Ambulatory Visit: Payer: Medicare HMO

## 2023-02-26 ENCOUNTER — Inpatient Hospital Stay: Payer: Medicare HMO | Attending: Physician Assistant

## 2023-02-26 ENCOUNTER — Other Ambulatory Visit: Payer: Medicare HMO

## 2023-02-26 ENCOUNTER — Encounter: Payer: Self-pay | Admitting: Internal Medicine

## 2023-02-26 ENCOUNTER — Inpatient Hospital Stay (HOSPITAL_BASED_OUTPATIENT_CLINIC_OR_DEPARTMENT_OTHER): Payer: Medicare HMO | Admitting: Physician Assistant

## 2023-02-26 VITALS — BP 117/57 | HR 78 | Temp 97.7°F | Resp 18 | Ht 66.0 in | Wt 151.8 lb

## 2023-02-26 DIAGNOSIS — Z5112 Encounter for antineoplastic immunotherapy: Secondary | ICD-10-CM

## 2023-02-26 DIAGNOSIS — C3411 Malignant neoplasm of upper lobe, right bronchus or lung: Secondary | ICD-10-CM | POA: Insufficient documentation

## 2023-02-26 DIAGNOSIS — C7951 Secondary malignant neoplasm of bone: Secondary | ICD-10-CM | POA: Diagnosis not present

## 2023-02-26 DIAGNOSIS — Z902 Acquired absence of lung [part of]: Secondary | ICD-10-CM | POA: Insufficient documentation

## 2023-02-26 LAB — CMP (CANCER CENTER ONLY)
ALT: 17 U/L (ref 0–44)
AST: 29 U/L (ref 15–41)
Albumin: 3.6 g/dL (ref 3.5–5.0)
Alkaline Phosphatase: 84 U/L (ref 38–126)
Anion gap: 5 (ref 5–15)
BUN: 18 mg/dL (ref 8–23)
CO2: 29 mmol/L (ref 22–32)
Calcium: 9.2 mg/dL (ref 8.9–10.3)
Chloride: 106 mmol/L (ref 98–111)
Creatinine: 1.26 mg/dL — ABNORMAL HIGH (ref 0.44–1.00)
GFR, Estimated: 45 mL/min — ABNORMAL LOW (ref >60)
Glucose, Bld: 111 mg/dL — ABNORMAL HIGH (ref 70–99)
Potassium: 4.1 mmol/L (ref 3.5–5.1)
Sodium: 140 mmol/L (ref 135–145)
Total Bilirubin: 0.8 mg/dL (ref <1.2)
Total Protein: 6.9 g/dL (ref 6.5–8.1)

## 2023-02-26 LAB — CBC WITH DIFFERENTIAL (CANCER CENTER ONLY)
Abs Immature Granulocytes: 0.01 10*3/uL (ref 0.00–0.07)
Basophils Absolute: 0.1 10*3/uL (ref 0.0–0.1)
Basophils Relative: 1 %
Eosinophils Absolute: 1.6 10*3/uL — ABNORMAL HIGH (ref 0.0–0.5)
Eosinophils Relative: 16 %
HCT: 35.9 % — ABNORMAL LOW (ref 36.0–46.0)
Hemoglobin: 11.4 g/dL — ABNORMAL LOW (ref 12.0–15.0)
Immature Granulocytes: 0 %
Lymphocytes Relative: 25 %
Lymphs Abs: 2.5 10*3/uL (ref 0.7–4.0)
MCH: 28.4 pg (ref 26.0–34.0)
MCHC: 31.8 g/dL (ref 30.0–36.0)
MCV: 89.5 fL (ref 80.0–100.0)
Monocytes Absolute: 0.7 10*3/uL (ref 0.1–1.0)
Monocytes Relative: 7 %
Neutro Abs: 5 10*3/uL (ref 1.7–7.7)
Neutrophils Relative %: 51 %
Platelet Count: 185 10*3/uL (ref 150–400)
RBC: 4.01 MIL/uL (ref 3.87–5.11)
RDW: 14.3 % (ref 11.5–15.5)
WBC Count: 9.8 10*3/uL (ref 4.0–10.5)
nRBC: 0 % (ref 0.0–0.2)

## 2023-02-26 MED ORDER — SODIUM CHLORIDE 0.9 % IV SOLN: Freq: Once | INTRAVENOUS | Status: AC

## 2023-02-26 MED ORDER — SODIUM CHLORIDE 0.9 % IV SOLN
200.0000 mg | Freq: Once | INTRAVENOUS | Status: AC
  Administered 2023-02-26: 200 mg via INTRAVENOUS
  Filled 2023-02-26: qty 200

## 2023-02-26 NOTE — Progress Notes (Signed)
[facility] OFFICE PROGRESS NOTE  Brandi Able, MD [address] [place] [address]  DIAGNOSIS: Metastatic non-small cell lung cancer, adenocarcinoma that was initially diagnosed as stage IIb (T1b, N1, M0) non-small cell lung cancer, adenocarcinoma presented with right upper lobe lung nodule   Detected Alteration(s) / Biomarker(s) Associated FDA-approved therapiesClinical Trial Availability% cfDNA or Amplification BRAF V600E approved by FDA Dabrafenib+trametinib, Encorafenib+binimetinib approved in other indication Cobimetinib, Dabrafenib, Trametinib, Vemurafenib, Vemurafenib+cobimetinib Yes8.3%   BRCA1 R252S None (VUS) None (VUS) 0.1%     SMAD4 D458fs None None 7.2%   PD-L1 expression 35%  PRIOR THERAPY: 1) Status post right upper lobectomy with lymph node dissection on September 30, 2019 in [place].  She declines adjuvant systemic chemotherapy. 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 Mitzi Hansen.  CURRENT THERAPY: Systemic chemotherapy with carboplatin for AUC of 5, Alimta 500 Mg/M2 and Keytruda 200 Mg IV every 3 weeks.  First dose April 16, 2022.  Status post 15 cycles.  Starting cycle #5 she will be on maintenance treatment with Keytruda every 3 weeks.   INTERVAL HISTORY: Brandi Holmes 72 y.o. female returns to the clinic for a follow-up visit.  The patient is feeling fairly today without any concerning complaints. She actually is feeling well.  She is currently undergoing maintenance treatment with immunotherapy with Keytruda.  She is tolerating this well.  Today, she denies any fever, chills, night sweats, or unexplained weight loss. She gained a few pounds. She has some dyspnea on exertion moreso related to decreased exercise tolerance and fatigue. She states this is stable. She may intermittently get a cough. This is her baseline. Denies  any nausea, vomiting, or diarrhea.  She struggles with chronic constipation.  Denies any headaches or visual changes.  She previously was having some eye sensitivity to sunlight and has been using tinted glasses and is using medical transport instead of driving and less short distances.  Denies any rashes or skin changes. She is here today for evaluation repeat blood work before undergoing cycle #16   MEDICAL HISTORY: Past Medical History:  Diagnosis Date   Anemia    as a child only   Coronary artery calcification of native artery    Hyperlipidemia    Hypertension    Liver spots    " per pt"   Lung nodule    Wears glasses    Wears partial dentures     ALLERGIES:  is allergic to crab [shellfish allergy] and morphine and codeine.  MEDICATIONS:  Current Outpatient Medications  Medication Sig Dispense Refill   aspirin EC 81 MG tablet Take 81 mg by mouth at bedtime. Swallow whole.     atorvastatin (LIPITOR) 20 MG tablet Take 20 mg by mouth at bedtime.     folic acid (FOLVITE) 1 MG tablet TAKE ONE TABLET BY MOUTH EVERY DAY 30 tablet 0   furosemide (LASIX) 20 MG tablet Take 20 mg by mouth daily.     lactose free nutrition (BOOST) LIQD Take 237 mLs by mouth 2 (two) times daily between meals.     latanoprost (XALATAN) 0.005 % ophthalmic solution Place 1 drop into both eyes at bedtime.     lisinopril (ZESTRIL) 10 MG tablet Take 1 tablet (10 mg total) by mouth every morning. 90 tablet 0   magnesium citrate SOLN Take 1 Bottle by mouth once a week.  OVER THE COUNTER MEDICATION "Prune X" -2-3 x week     pregabalin (LYRICA) 150 MG capsule Take 150 mg by mouth 2 (two) times daily.     prochlorperazine (COMPAZINE) 10 MG tablet Take 1 tablet (10 mg total) by mouth every 6 (six) hours as needed for nausea or vomiting. (Patient not taking: Reported on 12/25/2022) 30 tablet 0   tiZANidine (ZANAFLEX) 2 MG tablet Take 2 mg by mouth at bedtime.     traMADol HCl 100 MG TABS Take 1 tablet by mouth QID. 1  tablet by mouth every 6 hours as needed for pain     No current facility-administered medications for this visit.    SURGICAL HISTORY:  Past Surgical History:  Procedure Laterality Date   ABDOMINAL HYSTERECTOMY  1974   heavy bleeding   ABDOMINAL HYSTERECTOMY     BRONCHIAL BRUSHINGS  08/13/2019   Procedure: BRONCHIAL BRUSHINGS;  Surgeon: Josephine Igo, DO;  Location: [facility];  Service: Pulmonary;;   BRONCHIAL WASHINGS  08/13/2019   Procedure: BRONCHIAL WASHINGS;  Surgeon: Josephine Igo, DO;  Location: [facility];  Service: Pulmonary;;   COLONOSCOPY W/ BIOPSIES AND POLYPECTOMY     DILATION AND CURETTAGE OF UTERUS     FINE NEEDLE ASPIRATION  08/13/2019   Procedure: FINE NEEDLE ASPIRATION (FNA) LINEAR;  Surgeon: Josephine Igo, DO;  Location: [facility];  Service: Pulmonary;;   FOOT SURGERY     bilateral bunions and hammer toes   IR RADIOLOGIST EVAL & MGMT  05/20/2022   LUNG BIOPSY  08/13/2019   Procedure: LUNG BIOPSY;  Surgeon: Josephine Igo, DO;  Location: [facility];  Service: Pulmonary;;   MULTIPLE TOOTH EXTRACTIONS     VIDEO BRONCHOSCOPY WITH ENDOBRONCHIAL NAVIGATION Right 08/13/2019   Procedure: VIDEO BRONCHOSCOPY WITH ENDOBRONCHIAL NAVIGATION;  Surgeon: Josephine Igo, DO;  Location: [facility];  Service: Pulmonary;  Laterality: Right;   VIDEO BRONCHOSCOPY WITH ENDOBRONCHIAL ULTRASOUND Right 08/13/2019   Procedure: VIDEO BRONCHOSCOPY WITH ENDOBRONCHIAL ULTRASOUND;  Surgeon: Josephine Igo, DO;  Location: [facility];  Service: Pulmonary;  Laterality: Right;    REVIEW OF SYSTEMS:   Constitutional: Positive for stable fatigue. Negative for appetite change, chills,  fever and unexpected weight change.  HENT: Negative for mouth sores, nosebleeds, sore throat and trouble swallowing.   Eyes: Negative for eye problems and icterus.  Respiratory: Positive for stable dyspnea on exertion. Stable intermittent cough. Negative for hemoptysis and wheezing.    Cardiovascular: Positive for right sided rib pain since undergoing lobectomy. Positive for stable bilateral ankle swelling.  Gastrointestinal: Negative for abdominal pain, constipation, diarrhea, nausea and vomiting.  Genitourinary: Negative for bladder incontinence, difficulty urinating, dysuria, frequency and hematuria.   Musculoskeletal: Positive for chronic back pain (better controlled than prior). Negative for problem, neck pain and neck stiffness.  Skin: Negative for itching and rash.  Neurological: Negative for dizziness, extremity weakness, gait problem, headaches, light-headedness and seizures.  Hematological: Negative for adenopathy. Does not bruise/bleed easily.  Psychiatric/Behavioral: Negative for confusion, depression and sleep disturbance. The patient is not nervous/anxious.    PHYSICAL EXAMINATION:  Blood pressure (!) 117/57, pulse 78, temperature 97.7 F (36.5 C), temperature source Temporal, resp. rate 18, height 5\' 6"  (1.676 m), weight 151 lb 12.8 oz (68.9 kg), SpO2 97%.  ECOG PERFORMANCE STATUS: 2  Physical Exam  Constitutional: Oriented to person, place, and time and well-developed, well-nourished, and in no distress.   HENT:  Head: Normocephalic and atraumatic.  Mouth/Throat: Oropharynx is clear and moist. No oropharyngeal  exudate.  Eyes: Conjunctivae are normal. Right eye exhibits no discharge. Left eye exhibits no discharge. No scleral icterus.  Neck: Normal range of motion. Neck supple.  Cardiovascular: Normal rate, regular rhythm, normal heart sounds and intact distal pulses.   Pulmonary/Chest: Effort normal and breath sounds normal. No respiratory distress. No wheezes. No rales.  Abdominal: Soft. Bowel sounds are normal. Exhibits no distension and no mass. There is no tenderness.  Musculoskeletal: Normal range of motion. Exhibits no edema. Exhibits muscle wasting. Ambulates leaning forward due to back pain.  Lymphadenopathy:    No cervical adenopathy.   Neurological: Alert and oriented to person, place, and time. Exhibits normal muscle tone. Gait normal. Coordination normal.  Skin: Skin is warm and dry. No rash noted. Not diaphoretic. No erythema. No pallor.  Psychiatric: Mood, memory and judgment normal.  Vitals reviewed.  LABORATORY DATA: Lab Results  Component Value Date   WBC 9.8 02/26/2023   HGB 11.4 (L) 02/26/2023   HCT 35.9 (L) 02/26/2023   MCV 89.5 02/26/2023   PLT 185 02/26/2023      Chemistry      Component Value Date/Time   NA 139 02/05/2023 1041   NA 142 07/22/2019 1522   K 3.9 02/05/2023 1041   CL 107 02/05/2023 1041   CO2 28 02/05/2023 1041   BUN 25 (H) 02/05/2023 1041   BUN 13 07/22/2019 1522   CREATININE 1.57 (H) 02/05/2023 1041   CREATININE 0.91 09/16/2011 1000      Component Value Date/Time   CALCIUM 9.1 02/05/2023 1041   ALKPHOS 83 02/05/2023 1041   AST 32 02/05/2023 1041   ALT 20 02/05/2023 1041   BILITOT 0.6 02/05/2023 1041       RADIOGRAPHIC STUDIES:  No results found.   ASSESSMENT/PLAN:  This is a very pleasant 72 year old African-American female diagnosed with metastatic non-small cell lung cancer, adenocarcinoma.  She was initially diagnosed as a stage IIb (T1b, N1, M0).  She is status post right upper lobectomy with lymph node dissection on September 30, 2019 in [place].  She declined adjuvant systemic chemotherapy at that time.   She is found to have metastatic disease to the bone in November 2023.   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   She underwent palliative radiation to T9 under the care of Dr. Mitzi Hansen.   She is currently on systemic therapy.  She was started on systemic chemotherapy with carboplatin for an AUC of 5, Alimta 500 mg/m, and Keytruda 200 mg IV every 3 weeks.  She is status post 15 cycles.  Starting from cycle #5, she started maintenance treatment with immunotherapy with Keytruda only.  Alimta was discontinued secondary  to renal insufficiency.    Labs were reviewed.  Recommend that she proceed with cycle #16 today as schedule.   I will arrange for restaging CT scan of the chest, abdomen, and pelvis prior to starting her next cycle of treatment. I will order without contrast due to her renal insuffiencey which fluctuates.   We will see her back for follow-up visit in 3 weeks for evaluation repeat blood work before undergoing cycle #17.  The patient was advised to call immediately if she has any concerning symptoms in the interval. The patient voices understanding of current disease status and treatment options and is in agreement with the current care plan. All questions were answered. The patient knows to call the clinic with any problems, questions or concerns.  We can certainly see the patient much sooner if necessary    Orders Placed This Encounter  Procedures   CT CHEST ABDOMEN PELVIS WO CONTRAST    Standing Status:   Future    Standing Expiration Date:   02/26/2024    Order Specific Question:   Preferred imaging location?    Answer:   [facility]    Order Specific Question:   If indicated for the ordered procedure, I authorize the administration of oral contrast media per Radiology protocol    Answer:   Yes    Order Specific Question:   Does the patient have a contrast media/X-ray dye allergy?    Answer:   No     The total time spent in the appointment was 20-29 minutes.  Taetum Flewellen L Valary Manahan, PA-C 02/26/23

## 2023-02-26 NOTE — Patient Instructions (Signed)
[facility] - A DEPT OF [facility]  Discharge Instructions: Thank you for choosing [facility] to provide your oncology and hematology care.   If you have a lab appointment with the Cancer Center, please go directly to the Cancer Center and check in at the registration area.   Wear comfortable clothing and clothing appropriate for easy access to any Portacath or PICC line.   We strive to give you quality time with your provider. You may need to reschedule your appointment if you arrive late (15 or more minutes).  Arriving late affects you and other patients whose appointments are after yours.  Also, if you miss three or more appointments without notifying the office, you may be dismissed from the clinic at the provider's discretion.      For prescription refill requests, have your pharmacy contact our office and allow 72 hours for refills to be completed.    Today you received the following chemotherapy and/or immunotherapy agents: pembrolizumab      To help prevent nausea and vomiting after your treatment, we encourage you to take your nausea medication as directed.  BELOW ARE SYMPTOMS THAT SHOULD BE REPORTED IMMEDIATELY: *FEVER GREATER THAN 100.4 F (38 C) OR HIGHER *CHILLS OR SWEATING *NAUSEA AND VOMITING THAT IS NOT CONTROLLED WITH YOUR NAUSEA MEDICATION *UNUSUAL SHORTNESS OF BREATH *UNUSUAL BRUISING OR BLEEDING *URINARY PROBLEMS (pain or burning when urinating, or frequent urination) *BOWEL PROBLEMS (unusual diarrhea, constipation, pain near the anus) TENDERNESS IN MOUTH AND THROAT WITH OR WITHOUT PRESENCE OF ULCERS (sore throat, sores in mouth, or a toothache) UNUSUAL RASH, SWELLING OR PAIN  UNUSUAL VAGINAL DISCHARGE OR ITCHING   Items with * indicate a potential emergency and should be followed up as soon as possible or go to the Emergency Department if any problems should occur.  Please show the CHEMOTHERAPY ALERT CARD or  IMMUNOTHERAPY ALERT CARD at check-in to the Emergency Department and triage nurse.  Should you have questions after your visit or need to cancel or reschedule your appointment, please contact [facility] - A DEPT OF [facility]  Dept: [phone]  and follow the prompts.  Office hours are 8:00 a.m. to 4:30 p.m. Monday - Friday. Please note that voicemails left after 4:00 p.m. may not be returned until the following business day.  We are closed weekends and major holidays. You have access to a nurse at all times for urgent questions. Please call the main number to the clinic Dept: [phone] and follow the prompts.   For any non-urgent questions, you may also contact your provider using MyChart. We now offer e-Visits for anyone 78 and older to request care online for non-urgent symptoms. For details visit [email or web address].   Also download the MyChart app! Go to the app store, search "MyChart", open the app, select , and log in with your MyChart username and password.

## 2023-03-14 ENCOUNTER — Ambulatory Visit (HOSPITAL_COMMUNITY)
Admission: RE | Admit: 2023-03-14 | Discharge: 2023-03-14 | Disposition: A | Discharge status: Home / Self Care | Payer: Medicare HMO | Source: Ambulatory Visit | Attending: Physician Assistant

## 2023-03-14 ENCOUNTER — Other Ambulatory Visit: Payer: Self-pay | Admitting: Physician Assistant

## 2023-03-14 DIAGNOSIS — C3411 Malignant neoplasm of upper lobe, right bronchus or lung: Secondary | ICD-10-CM | POA: Insufficient documentation

## 2023-03-18 NOTE — Progress Notes (Unsigned)
[facility] OFFICE PROGRESS NOTE  Brandi Able, MD [address] [place] [address]  DIAGNOSIS: Metastatic non-small cell lung cancer, adenocarcinoma that was initially diagnosed as stage IIb (T1b, N1, M0) non-small cell lung cancer, adenocarcinoma presented with right upper lobe lung nodule   Detected Alteration(s) / Biomarker(s) Associated FDA-approved therapiesClinical Trial Availability% cfDNA or Amplification BRAF V600E approved by FDA Dabrafenib+trametinib, Encorafenib+binimetinib approved in other indication Cobimetinib, Dabrafenib, Trametinib, Vemurafenib, Vemurafenib+cobimetinib Yes8.3%   BRCA1 R252S None (VUS) None (VUS) 0.1%     SMAD4 D447fs None None 7.2%   PD-L1 expression 35%  PRIOR THERAPY: 1) Status post right upper lobectomy with lymph node dissection on September 30, 2019 in [place].  She declines adjuvant systemic chemotherapy. 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 Mitzi Hansen.  CURRENT THERAPY: Systemic chemotherapy with carboplatin for AUC of 5, Alimta 500 Mg/M2 and Keytruda 200 Mg IV every 3 weeks. First dose April 16, 2022. Status post 16 cycles. Starting cycle #5 she will be on maintenance treatment with Keytruda every 3 weeks.   INTERVAL HISTORY: Brandi Holmes 72 y.o. female returns to the clinic for a follow-up visit.  The patient is feeling fairly today without any concerning complaints. She was last seen by myself 3 weeks ago.   She is currently undergoing maintenance treatment with immunotherapy with Keytruda.  She is tolerating this well.  Today, she denies any fever, chills, night sweats, or unexplained weight loss. She reports a good appetite. She has some dyspnea on exertion moreso related to decreased exercise tolerance and fatigue. She states this is stable. She may intermittently get a cough due to phelgm.  This is her baseline and she states she feels like she had a piece of phlegm stuck in her throat since her bronchoscopy several years ago. Denies any nausea, vomiting, or diarrhea.  She struggles with chronic constipation and denies any concerns today.  Denies any headaches or visual changes.  Denies any rashes or skin changes. She recently had a restaging CT scan performed. She is here today for evaluation and to review her scan before undergoing cycle #17.    MEDICAL HISTORY: Past Medical History:  Diagnosis Date   Anemia    as a child only   Coronary artery calcification of native artery    Hyperlipidemia    Hypertension    Liver spots    " per pt"   Lung nodule    Wears glasses    Wears partial dentures     ALLERGIES:  is allergic to crab [shellfish allergy] and morphine and codeine.  MEDICATIONS:  Current Outpatient Medications  Medication Sig Dispense Refill   aspirin EC 81 MG tablet Take 81 mg by mouth at bedtime. Swallow whole.     atorvastatin (LIPITOR) 20 MG tablet Take 20 mg by mouth at bedtime.     folic acid (FOLVITE) 1 MG tablet TAKE ONE TABLET BY MOUTH EVERY DAY 30 tablet 0   furosemide (LASIX) 20 MG tablet Take 20 mg by mouth daily.     lactose free nutrition (BOOST) LIQD Take 237 mLs by mouth 2 (two) times daily between meals.     latanoprost (XALATAN) 0.005 % ophthalmic solution Place 1 drop into both eyes at bedtime.     magnesium citrate SOLN Take 1 Bottle by mouth once a week.     OVER THE COUNTER MEDICATION "Prune  X" -2-3 x week     pregabalin (LYRICA) 150 MG capsule Take 150 mg by mouth 2 (two) times daily.     prochlorperazine (COMPAZINE) 10 MG tablet Take 1 tablet (10 mg total) by mouth every 6 (six) hours as needed for nausea or vomiting. 30 tablet 0   tiZANidine (ZANAFLEX) 2 MG tablet Take 2 mg by mouth at bedtime.     traMADol HCl 100 MG TABS Take 1 tablet by mouth QID. 1 tablet by mouth every 6 hours as needed for pain     lisinopril (ZESTRIL) 10 MG  tablet Take 1 tablet (10 mg total) by mouth every morning. 90 tablet 0   No current facility-administered medications for this visit.    SURGICAL HISTORY:  Past Surgical History:  Procedure Laterality Date   ABDOMINAL HYSTERECTOMY  1974   heavy bleeding   ABDOMINAL HYSTERECTOMY     BRONCHIAL BRUSHINGS  08/13/2019   Procedure: BRONCHIAL BRUSHINGS;  Surgeon: Josephine Igo, DO;  Location: [facility];  Service: Pulmonary;;   BRONCHIAL WASHINGS  08/13/2019   Procedure: BRONCHIAL WASHINGS;  Surgeon: Josephine Igo, DO;  Location: [facility];  Service: Pulmonary;;   COLONOSCOPY W/ BIOPSIES AND POLYPECTOMY     DILATION AND CURETTAGE OF UTERUS     FINE NEEDLE ASPIRATION  08/13/2019   Procedure: FINE NEEDLE ASPIRATION (FNA) LINEAR;  Surgeon: Josephine Igo, DO;  Location: [facility];  Service: Pulmonary;;   FOOT SURGERY     bilateral bunions and hammer toes   IR RADIOLOGIST EVAL & MGMT  05/20/2022   LUNG BIOPSY  08/13/2019   Procedure: LUNG BIOPSY;  Surgeon: Josephine Igo, DO;  Location: [facility];  Service: Pulmonary;;   MULTIPLE TOOTH EXTRACTIONS     VIDEO BRONCHOSCOPY WITH ENDOBRONCHIAL NAVIGATION Right 08/13/2019   Procedure: VIDEO BRONCHOSCOPY WITH ENDOBRONCHIAL NAVIGATION;  Surgeon: Josephine Igo, DO;  Location: [facility];  Service: Pulmonary;  Laterality: Right;   VIDEO BRONCHOSCOPY WITH ENDOBRONCHIAL ULTRASOUND Right 08/13/2019   Procedure: VIDEO BRONCHOSCOPY WITH ENDOBRONCHIAL ULTRASOUND;  Surgeon: Josephine Igo, DO;  Location: [facility];  Service: Pulmonary;  Laterality: Right;    REVIEW OF SYSTEMS:   Constitutional: Positive for stable fatigue. Negative for appetite change, chills,  fever and unexpected weight change.  HENT: Negative for mouth sores, nosebleeds, sore throat and trouble swallowing.   Eyes: Negative for eye problems and icterus.  Respiratory: Positive for stable dyspnea on exertion. Stable intermittent cough. Negative for hemoptysis and  wheezing.   Cardiovascular: Positive for right sided rib pain since undergoing lobectomy. Positive for stable bilateral ankle swelling.  Gastrointestinal: Negative for abdominal pain, constipation, diarrhea, nausea and vomiting.  Genitourinary: Negative for bladder incontinence, difficulty urinating, dysuria, frequency and hematuria.   Musculoskeletal: Positive for chronic back pain and left shoulder pain (stable) Negative for problem, neck pain and neck stiffness.  Skin: Negative for itching and rash.  Neurological: Negative for dizziness, extremity weakness, gait problem, headaches, light-headedness and seizures.  Hematological: Negative for adenopathy. Does not bruise/bleed easily.  Psychiatric/Behavioral: Negative for confusion, depression and sleep disturbance. The patient is not nervous/anxious.     PHYSICAL EXAMINATION:  Blood pressure (!) 107/52, pulse 67, temperature 97.8 F (36.6 C), temperature source Temporal, resp. rate 13, weight 148 lb 14.4 oz (67.5 kg), SpO2 100%.  ECOG PERFORMANCE STATUS: 2  Physical Exam  Constitutional: Oriented to person, place, and time and well-developed, well-nourished, and in no distress.   HENT:  Head: Normocephalic and atraumatic.  Mouth/Throat: Oropharynx  is clear and moist. No oropharyngeal exudate.  Eyes: Conjunctivae are normal. Right eye exhibits no discharge. Left eye exhibits no discharge. No scleral icterus.  Neck: Normal range of motion. Neck supple.  Cardiovascular: Normal rate, regular rhythm, normal heart sounds and intact distal pulses.   Pulmonary/Chest: Effort normal and breath sounds normal. No respiratory distress. No wheezes. No rales.  Abdominal: Soft. Bowel sounds are normal. Exhibits no distension and no mass. There is no tenderness.  Musculoskeletal: Normal range of motion. Exhibits no edema. Exhibits muscle wasting.  Lymphadenopathy:    No cervical adenopathy.  Neurological: Alert and oriented to person, place, and time.  Exhibits muscle wasting. Examined in the wheelchair.  Skin: Skin is warm and dry. No rash noted. Not diaphoretic. No erythema. No pallor.  Psychiatric: Mood, memory and judgment normal.  Vitals reviewed.  LABORATORY DATA: Lab Results  Component Value Date   WBC 8.2 03/20/2023   HGB 11.4 (L) 03/20/2023   HCT 36.3 03/20/2023   MCV 89.9 03/20/2023   PLT 151 03/20/2023      Chemistry      Component Value Date/Time   NA 140 03/20/2023 1408   NA 142 07/22/2019 1522   K 4.3 03/20/2023 1408   CL 107 03/20/2023 1408   CO2 26 03/20/2023 1408   BUN 27 (H) 03/20/2023 1408   BUN 13 07/22/2019 1522   CREATININE 1.48 (H) 03/20/2023 1408   CREATININE 0.91 09/16/2011 1000      Component Value Date/Time   CALCIUM 9.2 03/20/2023 1408   ALKPHOS 73 03/20/2023 1408   AST 30 03/20/2023 1408   ALT 20 03/20/2023 1408   BILITOT 0.6 03/20/2023 1408       RADIOGRAPHIC STUDIES:  No results found.   ASSESSMENT/PLAN:  This is a very pleasant 72 year old African-American female diagnosed with metastatic non-small cell lung cancer, adenocarcinoma.  She was initially diagnosed as a stage IIb (T1b, N1, M0).  She is status post right upper lobectomy with lymph node dissection on September 30, 2019 in [place].  She declined adjuvant systemic chemotherapy at that time.   She is found to have metastatic disease to the bone in November 2023.  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   She underwent palliative radiation to T9 under the care of Dr. Mitzi Hansen.   She is currently on systemic therapy.  She was started on systemic chemotherapy with carboplatin for an AUC of 5, Alimta 500 mg/m, and Keytruda 200 mg IV every 3 weeks.  She is status post 16 cycles.  Starting from cycle #5, she started maintenance treatment with immunotherapy with Keytruda only.  Alimta was discontinued secondary to renal insufficiency.  The patient was seen with Dr. Arbutus Ped today.  Dr.  Arbutus Ped personally and independently reviewed the scan and discussed results with the patient today.  The scan did not show disease progression.    Labs were reviewed. Recommend that she proceed with cycle #17 today as schedule.   We will see her back for follow-up visit in 3 weeks for evaluation repeat blood work before undergoing cycle #18  The patient was advised to call immediately if she has any concerning symptoms in the interval. The patient voices understanding of current disease status and treatment options and is in agreement with the current care plan. All questions were answered. The patient knows to call the clinic with any problems, questions or concerns. We can certainly see the patient much  sooner if necessary  No orders of the defined types were placed in this encounter.    Everardo Voris L Brandi Guidry, PA-C 03/20/23  ADDENDUM: Hematology/Oncology Attending: I had a face-to-face encounter with the patient today.  I reviewed her record, lab, scan and recommended her care plan.  This is a very pleasant 72 years old African-American female with metastatic non-small cell lung cancer, adenocarcinoma with positive BRAF V600E mutation.  She started systemic chemotherapy with carboplatin, Alimta and Keytruda for 4 cycles followed by maintenance treatment with Alimta and Keytruda for additional 12 cycles.  The patient has been tolerating this treatment well with no concerning adverse effects. She had repeat CT scan of the chest, abdomen and pelvis performed recently.  I personally and independently reviewed the scan images and discussed the result with the patient today. Her scan showed no concerning findings for disease progression. I recommended for her to continue her current treatment with maintenance Alimta and Keytruda and she will proceed with cycle #17 today. The patient was advised to call immediately if she has any other concerning symptoms in the interval before her next cycle  in 3 weeks. The total time spent in the appointment was 30 minutes. Disclaimer: This note was dictated with voice recognition software. Similar sounding words can inadvertently be transcribed and may be missed upon review. Lajuana Matte, MD

## 2023-03-19 ENCOUNTER — Encounter: Payer: Self-pay | Admitting: Internal Medicine

## 2023-03-20 ENCOUNTER — Inpatient Hospital Stay (HOSPITAL_BASED_OUTPATIENT_CLINIC_OR_DEPARTMENT_OTHER): Payer: Medicare HMO | Admitting: Physician Assistant

## 2023-03-20 ENCOUNTER — Inpatient Hospital Stay: Payer: Medicare HMO | Attending: Physician Assistant

## 2023-03-20 ENCOUNTER — Encounter: Payer: Self-pay | Admitting: Internal Medicine

## 2023-03-20 ENCOUNTER — Inpatient Hospital Stay: Payer: Medicare HMO

## 2023-03-20 VITALS — BP 107/52 | HR 67 | Temp 97.8°F | Resp 13 | Wt 148.9 lb

## 2023-03-20 DIAGNOSIS — C3490 Malignant neoplasm of unspecified part of unspecified bronchus or lung: Secondary | ICD-10-CM

## 2023-03-20 DIAGNOSIS — Z902 Acquired absence of lung [part of]: Secondary | ICD-10-CM | POA: Insufficient documentation

## 2023-03-20 DIAGNOSIS — C7951 Secondary malignant neoplasm of bone: Secondary | ICD-10-CM | POA: Insufficient documentation

## 2023-03-20 DIAGNOSIS — Z5112 Encounter for antineoplastic immunotherapy: Secondary | ICD-10-CM | POA: Insufficient documentation

## 2023-03-20 DIAGNOSIS — N289 Disorder of kidney and ureter, unspecified: Secondary | ICD-10-CM | POA: Insufficient documentation

## 2023-03-20 DIAGNOSIS — R609 Edema, unspecified: Secondary | ICD-10-CM | POA: Insufficient documentation

## 2023-03-20 DIAGNOSIS — C3411 Malignant neoplasm of upper lobe, right bronchus or lung: Secondary | ICD-10-CM | POA: Insufficient documentation

## 2023-03-20 LAB — CBC WITH DIFFERENTIAL (CANCER CENTER ONLY)
Abs Immature Granulocytes: 0.01 10*3/uL (ref 0.00–0.07)
Basophils Absolute: 0.1 10*3/uL (ref 0.0–0.1)
Basophils Relative: 1 %
Eosinophils Absolute: 1.4 10*3/uL — ABNORMAL HIGH (ref 0.0–0.5)
Eosinophils Relative: 17 %
HCT: 36.3 % (ref 36.0–46.0)
Hemoglobin: 11.4 g/dL — ABNORMAL LOW (ref 12.0–15.0)
Immature Granulocytes: 0 %
Lymphocytes Relative: 28 %
Lymphs Abs: 2.3 10*3/uL (ref 0.7–4.0)
MCH: 28.2 pg (ref 26.0–34.0)
MCHC: 31.4 g/dL (ref 30.0–36.0)
MCV: 89.9 fL (ref 80.0–100.0)
Monocytes Absolute: 0.7 10*3/uL (ref 0.1–1.0)
Monocytes Relative: 9 %
Neutro Abs: 3.8 10*3/uL (ref 1.7–7.7)
Neutrophils Relative %: 45 %
Platelet Count: 151 10*3/uL (ref 150–400)
RBC: 4.04 MIL/uL (ref 3.87–5.11)
RDW: 14.3 % (ref 11.5–15.5)
WBC Count: 8.2 10*3/uL (ref 4.0–10.5)
nRBC: 0 % (ref 0.0–0.2)

## 2023-03-20 LAB — CMP (CANCER CENTER ONLY)
ALT: 20 U/L (ref 0–44)
AST: 30 U/L (ref 15–41)
Albumin: 3.7 g/dL (ref 3.5–5.0)
Alkaline Phosphatase: 73 U/L (ref 38–126)
Anion gap: 7 (ref 5–15)
BUN: 27 mg/dL — ABNORMAL HIGH (ref 8–23)
CO2: 26 mmol/L (ref 22–32)
Calcium: 9.2 mg/dL (ref 8.9–10.3)
Chloride: 107 mmol/L (ref 98–111)
Creatinine: 1.48 mg/dL — ABNORMAL HIGH (ref 0.44–1.00)
GFR, Estimated: 37 mL/min — ABNORMAL LOW (ref >60)
Glucose, Bld: 74 mg/dL (ref 70–99)
Potassium: 4.3 mmol/L (ref 3.5–5.1)
Sodium: 140 mmol/L (ref 135–145)
Total Bilirubin: 0.6 mg/dL (ref <1.2)
Total Protein: 6.8 g/dL (ref 6.5–8.1)

## 2023-03-20 MED ORDER — SODIUM CHLORIDE 0.9 % IV SOLN: Freq: Once | INTRAVENOUS | Status: AC

## 2023-03-20 MED ORDER — SODIUM CHLORIDE 0.9 % IV SOLN
200.0000 mg | Freq: Once | INTRAVENOUS | Status: AC
  Administered 2023-03-20: 200 mg via INTRAVENOUS
  Filled 2023-03-20: qty 200

## 2023-03-20 NOTE — Patient Instructions (Signed)
[facility] - A DEPT OF [facility]  Discharge Instructions: Thank you for choosing [facility] to provide your oncology and hematology care.   If you have a lab appointment with the Cancer Center, please go directly to the Cancer Center and check in at the registration area.   Wear comfortable clothing and clothing appropriate for easy access to any Portacath or PICC line.   We strive to give you quality time with your provider. You may need to reschedule your appointment if you arrive late (15 or more minutes).  Arriving late affects you and other patients whose appointments are after yours.  Also, if you miss three or more appointments without notifying the office, you may be dismissed from the clinic at the provider's discretion.      For prescription refill requests, have your pharmacy contact our office and allow 72 hours for refills to be completed.    Today you received the following chemotherapy and/or immunotherapy agents: pembrolizumab      To help prevent nausea and vomiting after your treatment, we encourage you to take your nausea medication as directed.  BELOW ARE SYMPTOMS THAT SHOULD BE REPORTED IMMEDIATELY: *FEVER GREATER THAN 100.4 F (38 C) OR HIGHER *CHILLS OR SWEATING *NAUSEA AND VOMITING THAT IS NOT CONTROLLED WITH YOUR NAUSEA MEDICATION *UNUSUAL SHORTNESS OF BREATH *UNUSUAL BRUISING OR BLEEDING *URINARY PROBLEMS (pain or burning when urinating, or frequent urination) *BOWEL PROBLEMS (unusual diarrhea, constipation, pain near the anus) TENDERNESS IN MOUTH AND THROAT WITH OR WITHOUT PRESENCE OF ULCERS (sore throat, sores in mouth, or a toothache) UNUSUAL RASH, SWELLING OR PAIN  UNUSUAL VAGINAL DISCHARGE OR ITCHING   Items with * indicate a potential emergency and should be followed up as soon as possible or go to the Emergency Department if any problems should occur.  Please show the CHEMOTHERAPY ALERT CARD or  IMMUNOTHERAPY ALERT CARD at check-in to the Emergency Department and triage nurse.  Should you have questions after your visit or need to cancel or reschedule your appointment, please contact [facility]  Dept: [phone]  and follow the prompts.  Office hours are 8:00 a.m. to 4:30 p.m. Monday - Friday. Please note that voicemails left after 4:00 p.m. may not be returned until the following business day.  We are closed weekends and major holidays. You have access to a nurse at all times for urgent questions. Please call the main number to the clinic Dept: [phone] and follow the prompts.   For any non-urgent questions, you may also contact your provider using MyChart. We now offer e-Visits for anyone 55 and older to request care online for non-urgent symptoms. For details visit [email or web address].   Also download the MyChart app! Go to the app store, search "MyChart", open the app, select [facility], and log in with your MyChart username and password.

## 2023-04-11 ENCOUNTER — Inpatient Hospital Stay: Payer: Medicare HMO

## 2023-04-11 ENCOUNTER — Encounter: Payer: Self-pay | Admitting: Nurse Practitioner

## 2023-04-11 ENCOUNTER — Inpatient Hospital Stay (HOSPITAL_BASED_OUTPATIENT_CLINIC_OR_DEPARTMENT_OTHER): Payer: Medicare HMO | Admitting: Nurse Practitioner

## 2023-04-11 VITALS — BP 118/65 | HR 79 | Temp 97.6°F | Resp 19 | Wt 151.4 lb

## 2023-04-11 DIAGNOSIS — C7951 Secondary malignant neoplasm of bone: Secondary | ICD-10-CM

## 2023-04-11 DIAGNOSIS — R600 Localized edema: Secondary | ICD-10-CM

## 2023-04-11 DIAGNOSIS — C3491 Malignant neoplasm of unspecified part of right bronchus or lung: Secondary | ICD-10-CM

## 2023-04-11 DIAGNOSIS — Z5112 Encounter for antineoplastic immunotherapy: Secondary | ICD-10-CM | POA: Diagnosis not present

## 2023-04-11 LAB — CMP (CANCER CENTER ONLY)
ALT: 16 U/L (ref 0–44)
AST: 24 U/L (ref 15–41)
Albumin: 3.4 g/dL — ABNORMAL LOW (ref 3.5–5.0)
Alkaline Phosphatase: 79 U/L (ref 38–126)
Anion gap: 5 (ref 5–15)
BUN: 31 mg/dL — ABNORMAL HIGH (ref 8–23)
CO2: 25 mmol/L (ref 22–32)
Calcium: 9 mg/dL (ref 8.9–10.3)
Chloride: 111 mmol/L (ref 98–111)
Creatinine: 1.41 mg/dL — ABNORMAL HIGH (ref 0.44–1.00)
GFR, Estimated: 39 mL/min — ABNORMAL LOW (ref >60)
Glucose, Bld: 104 mg/dL — ABNORMAL HIGH (ref 70–99)
Potassium: 4.3 mmol/L (ref 3.5–5.1)
Sodium: 141 mmol/L (ref 135–145)
Total Bilirubin: 0.5 mg/dL (ref <1.2)
Total Protein: 6.4 g/dL — ABNORMAL LOW (ref 6.5–8.1)

## 2023-04-11 LAB — CBC WITH DIFFERENTIAL (CANCER CENTER ONLY)
Abs Immature Granulocytes: 0.01 10*3/uL (ref 0.00–0.07)
Basophils Absolute: 0.1 10*3/uL (ref 0.0–0.1)
Basophils Relative: 1 %
Eosinophils Absolute: 4.5 10*3/uL — ABNORMAL HIGH (ref 0.0–0.5)
Eosinophils Relative: 41 %
HCT: 34.1 % — ABNORMAL LOW (ref 36.0–46.0)
Hemoglobin: 11.1 g/dL — ABNORMAL LOW (ref 12.0–15.0)
Immature Granulocytes: 0 %
Lymphocytes Relative: 20 %
Lymphs Abs: 2.1 10*3/uL (ref 0.7–4.0)
MCH: 29.3 pg (ref 26.0–34.0)
MCHC: 32.6 g/dL (ref 30.0–36.0)
MCV: 90 fL (ref 80.0–100.0)
Monocytes Absolute: 0.7 10*3/uL (ref 0.1–1.0)
Monocytes Relative: 7 %
Neutro Abs: 3.3 10*3/uL (ref 1.7–7.7)
Neutrophils Relative %: 31 %
Platelet Count: 184 10*3/uL (ref 150–400)
RBC: 3.79 MIL/uL — ABNORMAL LOW (ref 3.87–5.11)
RDW: 14 % (ref 11.5–15.5)
WBC Count: 10.7 10*3/uL — ABNORMAL HIGH (ref 4.0–10.5)
nRBC: 0 % (ref 0.0–0.2)

## 2023-04-11 MED ORDER — SODIUM CHLORIDE 0.9 % IV SOLN: Freq: Once | INTRAVENOUS | Status: AC

## 2023-04-11 MED ORDER — SODIUM CHLORIDE 0.9 % IV SOLN
200.0000 mg | Freq: Once | INTRAVENOUS | Status: AC
  Administered 2023-04-11: 200 mg via INTRAVENOUS
  Filled 2023-04-11: qty 200

## 2023-04-11 NOTE — Assessment & Plan Note (Signed)
initially diagnosed as a stage IIb (T1b, N1, M0).  She is status post right upper lobectomy with lymph node dissection on September 30, 2019 in [place].  She declined adjuvant systemic chemotherapy at that time.  She was found to have metastatic disease to the bone in November 2023.  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   She underwent palliative radiation to T9 under the care of Dr. Mitzi Hansen.   She is currently on systemic therapy.  She was started on systemic chemotherapy with carboplatin for an AUC of 5, Alimta 500 mg/m, and Keytruda 200 mg IV every 3 weeks.  She is status post 16 cycles.  Starting from cycle #5, she started maintenance treatment with immunotherapy with Keytruda only.  Alimta was discontinued secondary to renal insufficiency.   Restaging CT CAP reviewed with the patient at visit 03/20/2023. The scan did not show disease progression.  She proceeded with cycle 17 of Keytruda, and will continue this every 3 weeks.

## 2023-04-11 NOTE — Patient Instructions (Signed)
 [facility] - A DEPT OF [facility]  Discharge Instructions: Thank you for choosing [facility] to provide your oncology and hematology care.   If you have a lab appointment with the Cancer Center, please go directly to the Cancer Center and check in at the registration area.   Wear comfortable clothing and clothing appropriate for easy access to any Portacath or PICC line.   We strive to give you quality time with your provider. You may need to reschedule your appointment if you arrive late (15 or more minutes).  Arriving late affects you and other patients whose appointments are after yours.  Also, if you miss three or more appointments without notifying the office, you may be dismissed from the clinic at the provider's discretion.      For prescription refill requests, have your pharmacy contact our office and allow 72 hours for refills to be completed.    Today you received the following chemotherapy and/or immunotherapy agents: pembrolizumab      To help prevent nausea and vomiting after your treatment, we encourage you to take your nausea medication as directed.  BELOW ARE SYMPTOMS THAT SHOULD BE REPORTED IMMEDIATELY: *FEVER GREATER THAN 100.4 F (38 C) OR HIGHER *CHILLS OR SWEATING *NAUSEA AND VOMITING THAT IS NOT CONTROLLED WITH YOUR NAUSEA MEDICATION *UNUSUAL SHORTNESS OF BREATH *UNUSUAL BRUISING OR BLEEDING *URINARY PROBLEMS (pain or burning when urinating, or frequent urination) *BOWEL PROBLEMS (unusual diarrhea, constipation, pain near the anus) TENDERNESS IN MOUTH AND THROAT WITH OR WITHOUT PRESENCE OF ULCERS (sore throat, sores in mouth, or a toothache) UNUSUAL RASH, SWELLING OR PAIN  UNUSUAL VAGINAL DISCHARGE OR ITCHING   Items with * indicate a potential emergency and should be followed up as soon as possible or go to the Emergency Department if any problems should occur.  Please show the CHEMOTHERAPY ALERT CARD or  IMMUNOTHERAPY ALERT CARD at check-in to the Emergency Department and triage nurse.  Should you have questions after your visit or need to cancel or reschedule your appointment, please contact [facility]  Dept: [phone]  and follow the prompts.  Office hours are 8:00 a.m. to 4:30 p.m. Monday - Friday. Please note that voicemails left after 4:00 p.m. may not be returned until the following business day.  We are closed weekends and major holidays. You have access to a nurse at all times for urgent questions. Please call the main number to the clinic Dept: [phone] and follow the prompts.   For any non-urgent questions, you may also contact your provider using MyChart. We now offer e-Visits for anyone 55 and older to request care online for non-urgent symptoms. For details visit [email or web address].   Also download the MyChart app! Go to the app store, search "MyChart", open the app, select [facility], and log in with your MyChart username and password.

## 2023-04-11 NOTE — Progress Notes (Signed)
Patient Care Team: [name], MD as PCP - General (Family Medicine) Uvaldo Rising, MD (Inactive) (Family Medicine)  Clinic Day:  04/11/2023  Referring physician: Si Gaul, MD  ASSESSMENT & PLAN:   Assessment & Plan: Adenocarcinoma of right lung, stage 2 [facility]) initially diagnosed as a stage IIb (T1b, N1, M0).  She is status post right upper lobectomy with lymph node dissection on September 30, 2019 in [place].  She declined adjuvant systemic chemotherapy at that time.  She was found to have metastatic disease to the bone in November 2023.  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   She underwent palliative radiation to T9 under the care of Dr. Mitzi Hansen.   She is currently on systemic therapy.  She was started on systemic chemotherapy with carboplatin for an AUC of 5, Alimta 500 mg/m, and Keytruda 200 mg IV every 3 weeks.  She is status post 16 cycles.  Starting from cycle #5, she started maintenance treatment with immunotherapy with Keytruda only.  Alimta was discontinued secondary to renal insufficiency.   Restaging CT CAP reviewed with the patient at visit 03/20/2023. The scan did not show disease progression.  She proceeded with cycle 17 of Keytruda, and will continue this every 3 weeks.   Plan: Labs reviewed  -CBC showing WBC 10.7; Hgb 11.1; Hct 34.1; Plt 184; Anc 3.3 -CMP - K 4.3; glucose 104; BUN 31; Creatinine 1.41; eGFR 39; Ca 9.0; LFTs normal.   Stat order for bilateral lower extremity Doppler due to redness, pain, and mild swelling of both lower legs. Labs and patient status are adequate for her to proceed with treatment.  Patient wants to do treatment today.  Today is cycle 18. Labs, follow-up, and treatment as scheduled. Will contact patient with Doppler results once available.  The patient understands the plans discussed today and is in agreement with them.  She knows to contact our office if she develops concerns prior  to her next appointment.  I provided 30 minutes of face-to-face time during this encounter and > 50% was spent counseling as documented under my assessment and plan.    Carlean Jews, NP  [facility] - A DEPT OF [facility] [address] [place] [address] Dept: [phone] Dept Fax: [phone]   No orders of the defined types were placed in this encounter.     CHIEF COMPLAINT:  CC: Adenocarcinoma of right lung  Current Treatment: Maintenance treatment Keytruda every 3 weeks  INTERVAL HISTORY:  Brandi Holmes is here today for repeat clinical assessment.  He was last seen by Cassie, PA, on 03/20/2023. Restaging CT CAP on 03/20/2023 showed stable disease without evidence of recurrence or progression. Today begins cycle 18.  Patient concerned about both lower legs being very red, slightly swollen and tender to touch.  She states this started today.  She has had similar episodes in the past.  Previous Doppler studies showed a Baker's cyst in the right popliteal fossa and no evidence of DVT. She denies chest pain, chest pressure, or shortness of breath. She denies headaches or visual disturbances. She denies abdominal pain, nausea, vomiting, or changes in bowel or bladder habits.  She denies fevers or chills.  She states her primary care provider manages pain.  Pain control is adequate.  Her appetite is good. Her weight has increased 3 pounds over last 3 weeks .  I have reviewed the past medical history, past surgical history, social history and family history with the patient and they are unchanged from previous note.  ALLERGIES:  is allergic to crab [shellfish allergy] and morphine and codeine.  MEDICATIONS:  Current Outpatient Medications  Medication Sig Dispense Refill   aspirin EC 81 MG tablet Take 81 mg by mouth at bedtime. Swallow whole.     atorvastatin (LIPITOR) 20 MG tablet Take 20 mg by mouth at bedtime.      folic acid (FOLVITE) 1 MG tablet TAKE ONE TABLET BY MOUTH EVERY DAY 30 tablet 0   furosemide (LASIX) 20 MG tablet Take 20 mg by mouth daily.     lactose free nutrition (BOOST) LIQD Take 237 mLs by mouth 2 (two) times daily between meals.     latanoprost (XALATAN) 0.005 % ophthalmic solution Place 1 drop into both eyes at bedtime.     magnesium citrate SOLN Take 1 Bottle by mouth once a week.     OVER THE COUNTER MEDICATION "Prune X" -2-3 x week     pregabalin (LYRICA) 150 MG capsule Take 150 mg by mouth 2 (two) times daily.     prochlorperazine (COMPAZINE) 10 MG tablet Take 1 tablet (10 mg total) by mouth every 6 (six) hours as needed for nausea or vomiting. 30 tablet 0   tiZANidine (ZANAFLEX) 2 MG tablet Take 2 mg by mouth at bedtime.     traMADol HCl 100 MG TABS Take 1 tablet by mouth QID. 1 tablet by mouth every 6 hours as needed for pain     lisinopril (ZESTRIL) 10 MG tablet Take 1 tablet (10 mg total) by mouth every morning. 90 tablet 0   No current facility-administered medications for this visit.    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)   04/18/2022 -  Chemotherapy   Patient is on Treatment Plan : LUNG Carboplatin (5) + Pemetrexed (500) + Pembrolizumab (200) D1 q21d Induction x 4 cycles / Maintenance Pemetrexed (500) + Pembrolizumab (200) D1 q21d     Adenocarcinoma of upper lobe of right lung (HCC)  04/10/2022 Initial Diagnosis   Adenocarcinoma of upper lobe of right lung (HCC)   04/10/2022 Cancer Staging   Staging form: Lung, AJCC 8th Edition - Clinical: Stage IVB (cT1b, cN1,  cM1c) - Signed by Si Gaul, MD on 04/10/2022       REVIEW OF SYSTEMS:   Constitutional: Denies fevers, chills or abnormal weight loss Eyes: Denies blurriness of vision Ears, nose, mouth, throat, and face: Denies mucositis or sore throat Respiratory: Denies cough, dyspnea or wheezes Cardiovascular: Denies palpitation or chest discomfort.  She does have bilateral lower extremity swelling which is tender and red.  She reports this started today. Gastrointestinal:  Denies nausea, heartburn or change in bowel habits Skin: Denies abnormal skin rashes.  Redness of both lower extremities present and started today. Lymphatics: Denies new lymphadenopathy or easy bruising Neurological:Denies numbness, tingling or new weaknesses Behavioral/Psych: Mood is stable, no new changes  All other systems were reviewed with the patient and are negative.   VITALS:  Today's Vitals   04/11/23 1352 04/11/23 1401  BP: 118/65   Pulse: 79   Resp: 19   Temp: 97.6 F (36.4 C)   TempSrc: Temporal   SpO2: 96%   Weight: 151 lb 6.4 oz (68.7 kg)   PainSc:  5    Body mass index is 24.44 kg/m.   Wt Readings from Last 3 Encounters:  04/11/23 151 lb 6.4 oz (68.7 kg)  03/20/23 148 lb 14.4 oz (67.5 kg)  02/26/23 151 lb 12.8 oz (68.9 kg)    Body mass index is 24.44 kg/m.  Performance status (ECOG): 1 - Symptomatic but completely ambulatory  PHYSICAL EXAM:   GENERAL:alert, no distress and comfortable SKIN: skin color, texture, turgor are normal, no rashes or significant lesions.  There is some redness present in bilateral lower extremities and feet. EYES: normal, Conjunctiva are pink and non-injected, sclera clear OROPHARYNX:no exudate, no erythema and lips, buccal mucosa, and tongue normal  NECK: supple, thyroid normal size, non-tender, without nodularity LYMPH:  no palpable lymphadenopathy in the cervical, axillary or inguinal LUNGS: clear to auscultation and percussion with normal breathing  effort HEART: regular rate & rhythm and no murmurs.  There is mild, 1+ pitting edema in both lower extremities.  This is tender to palpate.  Skin is slightly red, but not warm to touch. ABDOMEN:abdomen soft, non-tender and normal bowel sounds Musculoskeletal:no cyanosis of digits and no clubbing  NEURO: alert & oriented x 3 with fluent speech, no focal motor/sensory deficits  LABORATORY DATA:  I have reviewed the data as listed    Component Value Date/Time   NA 141 04/11/2023 1330   NA 142 07/22/2019 1522   K 4.3 04/11/2023 1330   CL 111 04/11/2023 1330   CO2 25 04/11/2023 1330   GLUCOSE 104 (H) 04/11/2023 1330   BUN 31 (H) 04/11/2023 1330   BUN 13 07/22/2019 1522   CREATININE 1.41 (H) 04/11/2023 1330   CREATININE 0.91 09/16/2011 1000   CALCIUM 9.0 04/11/2023 1330   PROT 6.4 (L) 04/11/2023 1330   ALBUMIN 3.4 (L) 04/11/2023 1330   AST 24 04/11/2023 1330   ALT 16 04/11/2023 1330   ALKPHOS 79 04/11/2023 1330   BILITOT 0.5 04/11/2023 1330   GFRNONAA 39 (L) 04/11/2023 1330   GFRAA >60 08/13/2019 0605    Lab Results  Component Value Date   WBC 10.7 (H) 04/11/2023   NEUTROABS 3.3 04/11/2023   HGB 11.1 (L) 04/11/2023   HCT 34.1 (L) 04/11/2023   MCV 90.0 04/11/2023   PLT 184 04/11/2023    RADIOGRAPHIC STUDIES: CT CHEST ABDOMEN PELVIS WO CONTRAST Result Date: 03/20/2023 CLINICAL DATA:  Non-small cell lung cancer (NSCLC), metastatic. * Tracking Code: BO * EXAM: CT CHEST, ABDOMEN AND PELVIS WITHOUT CONTRAST TECHNIQUE: Multidetector CT imaging of the chest, abdomen and pelvis was performed following the standard protocol without IV contrast. RADIATION DOSE REDUCTION: This exam was performed according to the departmental dose-optimization program which includes automated exposure control, adjustment of the mA and/or kV according to patient size and/or use of iterative reconstruction technique. COMPARISON:  01/08/23. FINDINGS: CT CHEST FINDINGS Cardiovascular: The heart size appears  normal. Unchanged appearance of moderate to large pericardial effusion. Aortic atherosclerosis and multi vessel coronary artery calcifications. Mediastinum/Nodes: Thyroid gland, trachea, and esophagus appear normal. No enlarged axillary, supraclavicular, mediastinal lymph nodes. Lungs/Pleura: Small bilateral pleural effusions, similar. Right upper lobectomy. Posterior subpleural fibrosis within the right lung extending into the base is unchanged in the interval. Perihilar and paramediastinal masslike architectural distortion  within the right lower lung is also unchanged in the interval. Musculoskeletal: Multifocal bone metastases are identified within the thoracic spine including the T1, T2, T3, T8, T9, T10 levels. Stable compression deformities involving T7 and T8 as well as T9, T10 and T11. expansile lesion involving the right ninth rib at the costovertebral junction is similar. CT ABDOMEN PELVIS FINDINGS Hepatobiliary: There are a few scattered low-attenuation liver lesions which are unchanged from previous exam and favored to represent multiple liver cysts. No suspicious liver lesions identified. Gallbladder unremarkable. No bile duct dilatation. Pancreas: Unremarkable. No pancreatic ductal dilatation or surrounding inflammatory changes. Spleen: Normal in size without focal abnormality. Adrenals/Urinary Tract: Normal appearance of the adrenal glands. Dystrophic calcifications identified within the posterolateral cortex of the right kidney, unchanged. The underlying hypoenhancing lesion shown on 11/11/2022 is not well seen on today's noncontrast CT. Left kidney appears normal. Urinary bladder is unremarkable. Stomach/Bowel: Stomach appears within normal limits. Moderate retained stool identified within the distal transverse colon and descending colon up to the rectum. No pathologic dilatation of the large or small bowel loops. Vascular/Lymphatic: Aortic atherosclerotic calcifications. No signs of abdominal  adenopathy. No enlarged pelvic lymph nodes. Left inguinal node is borderline enlarged measuring 1.1 cm, image 117/2. Formally this measured the same. Prominent right inguinal lymph node measures 1.2 cm, image 115/2. Also unchanged in the interval. Reproductive: Status post hysterectomy. No adnexal masses. Other: No ascites or focal fluid collections identified. Musculoskeletal: Sclerotic bone metastases are again noted involving the L3, L4 and L5 vertebra, unchanged from prior exam. Lumbar degenerative disc disease. IMPRESSION: 1. Overall no significant change from 01/08/2023. 2. Unchanged appearance of multifocal bone metastases within the thoracic and lumbar spine. 3. Stable post treatment changes within the right lung without specific findings to suggest locally recurrent tumor. 4. Persistent large pericardial effusion. 5. Stable appearance of borderline enlarged bilateral inguinal lymph nodes. 6. Moderate retained stool identified within the distal transverse colon and descending colon up to the rectum. Correlate for any clinical signs or symptoms of constipation. 7.  Aortic Atherosclerosis (ICD10-I70.0). Electronically Signed   By: Signa Kell M.D.   On: 03/20/2023 15:50

## 2023-04-14 ENCOUNTER — Encounter (HOSPITAL_COMMUNITY): Payer: Medicare HMO

## 2023-04-14 ENCOUNTER — Ambulatory Visit (HOSPITAL_COMMUNITY)
Admission: RE | Admit: 2023-04-14 | Discharge: 2023-04-14 | Disposition: A | Discharge status: Home / Self Care | Payer: Medicare HMO | Source: Ambulatory Visit | Attending: Nurse Practitioner | Admitting: Nurse Practitioner

## 2023-04-14 DIAGNOSIS — C3491 Malignant neoplasm of unspecified part of right bronchus or lung: Secondary | ICD-10-CM | POA: Insufficient documentation

## 2023-04-14 DIAGNOSIS — R600 Localized edema: Secondary | ICD-10-CM | POA: Diagnosis not present

## 2023-04-14 NOTE — Progress Notes (Signed)
Lower extremity venous duplex completed. Please see CV Procedures for preliminary results.  Shona Simpson, RVT 04/14/23 3:00 PM

## 2023-04-15 ENCOUNTER — Encounter: Payer: Self-pay | Admitting: Nurse Practitioner

## 2023-04-17 ENCOUNTER — Other Ambulatory Visit: Payer: Self-pay | Admitting: Physician Assistant

## 2023-04-29 NOTE — Progress Notes (Signed)
[facility] OFFICE PROGRESS NOTE  [name], MD [address] [place] [address]  DIAGNOSIS: Metastatic non-small cell lung cancer, adenocarcinoma that was initially diagnosed as stage IIb (T1b, N1, M0) non-small cell lung cancer, adenocarcinoma presented with right upper lobe lung nodule   Detected Alteration(s) / Biomarker(s) Associated FDA-approved therapiesClinical Trial Availability% cfDNA or Amplification BRAF V600E approved by FDA Dabrafenib+trametinib, Encorafenib+binimetinib approved in other indication Cobimetinib, Dabrafenib, Trametinib, Vemurafenib, Vemurafenib+cobimetinib Yes8.3%   BRCA1 R252S None (VUS) None (VUS) 0.1%     SMAD4 D426fs None None 7.2%   PD-L1 expression 35%  PRIOR THERAPY: 1) Status post right upper lobectomy with lymph node dissection on September 30, 2019 [place].  She declines adjuvant systemic chemotherapy. 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 Mitzi Hansen.  CURRENT THERAPY: Systemic chemotherapy with carboplatin for AUC of 5, Alimta 500 Mg/M2 and Keytruda 200 Mg IV every 3 weeks. First dose April 16, 2022. Status post 18 cycles. Starting cycle #5 she will be on maintenance treatment with Keytruda every 3 weeks.   INTERVAL HISTORY: Brandi Holmes 73 y.o. female returns to the clinic for a follow-up visit.  The patient is feeling fairly today without any concerning complaints. She was last seen by my colleague 3 weeks ago. She had a lower extremity ultrasound performed at that time which was negative for DVT.  The patient was not able to tolerate compression stockings due to them being too tight and causing bruising.  She states that she has been seeing her family doctor.  She denies any excessive salt intake.  Ultrasounds have noted a stable Baker's cyst.  She is wondering if that could be contributing to  her symptoms.  She is currently undergoing maintenance treatment with immunotherapy with Keytruda.  She is tolerating this well.  Today, she denies any fever, chills, night sweats, or unexplained weight loss. She states she has been gaining a pound every 3 weeks.  Denies any changes with her breathing since last being seen.  She typically reports she may have cough due to phelgm. Denies any nausea, vomiting, or diarrhea.  She struggles with chronic constipation but states this is under control. Denies any headaches or visual changes.  He does have some dry skin and cracking near her fingertips.  Denies any rashes or skin changes otherwise. She is here for evaluation and repeat blood work.      MEDICAL HISTORY: Past Medical History:  Diagnosis Date   Anemia    as a child only   Coronary artery calcification of native artery    Hyperlipidemia    Hypertension    Liver spots    " per pt"   Lung nodule    Wears glasses    Wears partial dentures     ALLERGIES:  is allergic to crab [shellfish allergy] and morphine and codeine.  MEDICATIONS:  Current Outpatient Medications  Medication Sig Dispense Refill   aspirin EC 81 MG tablet Take 81 mg by mouth at bedtime. Swallow whole.     atorvastatin (LIPITOR) 20 MG tablet Take 20 mg by mouth at bedtime.     folic acid (FOLVITE) 1 MG tablet TAKE ONE TABLET BY MOUTH EVERY DAY 30 tablet 0   furosemide (LASIX) 20 MG tablet Take 20 mg by mouth daily.     lactose free nutrition (BOOST) LIQD Take 237 mLs by mouth 2 (  two) times daily between meals.     latanoprost (XALATAN) 0.005 % ophthalmic solution Place 1 drop into both eyes at bedtime.     lisinopril (ZESTRIL) 10 MG tablet Take 1 tablet (10 mg total) by mouth every morning. 90 tablet 0   magnesium citrate SOLN Take 1 Bottle by mouth once a week.     OVER THE COUNTER MEDICATION "Prune X" -2-3 x week     pregabalin (LYRICA) 150 MG capsule Take 150 mg by mouth 2 (two) times daily.      prochlorperazine (COMPAZINE) 10 MG tablet Take 1 tablet (10 mg total) by mouth every 6 (six) hours as needed for nausea or vomiting. 30 tablet 0   tiZANidine (ZANAFLEX) 2 MG tablet Take 2 mg by mouth at bedtime.     traMADol HCl 100 MG TABS Take 1 tablet by mouth QID. 1 tablet by mouth every 6 hours as needed for pain     No current facility-administered medications for this visit.    SURGICAL HISTORY:  Past Surgical History:  Procedure Laterality Date   ABDOMINAL HYSTERECTOMY  1974   heavy bleeding   ABDOMINAL HYSTERECTOMY     BRONCHIAL BRUSHINGS  08/13/2019   Procedure: BRONCHIAL BRUSHINGS;  Surgeon: Josephine Igo, DO;  Location: [facility];  Service: Pulmonary;;   BRONCHIAL WASHINGS  08/13/2019   Procedure: BRONCHIAL WASHINGS;  Surgeon: Josephine Igo, DO;  Location: [facility];  Service: Pulmonary;;   COLONOSCOPY W/ BIOPSIES AND POLYPECTOMY     DILATION AND CURETTAGE OF UTERUS     FINE NEEDLE ASPIRATION  08/13/2019   Procedure: FINE NEEDLE ASPIRATION (FNA) LINEAR;  Surgeon: Josephine Igo, DO;  Location: [facility];  Service: Pulmonary;;   FOOT SURGERY     bilateral bunions and hammer toes   IR RADIOLOGIST EVAL & MGMT  05/20/2022   LUNG BIOPSY  08/13/2019   Procedure: LUNG BIOPSY;  Surgeon: Josephine Igo, DO;  Location: [facility];  Service: Pulmonary;;   MULTIPLE TOOTH EXTRACTIONS     VIDEO BRONCHOSCOPY WITH ENDOBRONCHIAL NAVIGATION Right 08/13/2019   Procedure: VIDEO BRONCHOSCOPY WITH ENDOBRONCHIAL NAVIGATION;  Surgeon: Josephine Igo, DO;  Location: [facility];  Service: Pulmonary;  Laterality: Right;   VIDEO BRONCHOSCOPY WITH ENDOBRONCHIAL ULTRASOUND Right 08/13/2019   Procedure: VIDEO BRONCHOSCOPY WITH ENDOBRONCHIAL ULTRASOUND;  Surgeon: Josephine Igo, DO;  Location: [facility];  Service: Pulmonary;  Laterality: Right;    REVIEW OF SYSTEMS:   Constitutional: Positive for stable fatigue. Negative for appetite change, chills,  fever and unexpected weight  change.  HENT: Negative for mouth sores, nosebleeds, sore throat and trouble swallowing.   Eyes: Negative for eye problems and icterus.  Respiratory: Positive for stable dyspnea on exertion. Stable intermittent cough. Negative for hemoptysis and wheezing.   Cardiovascular: Positive for right sided rib pain since undergoing lobectomy. Positive for stable bilateral ankle swelling.  Gastrointestinal: Positive for chronic constipation (controlled). Negative for abdominal pain, diarrhea, nausea and vomiting.  Genitourinary: Negative for bladder incontinence, difficulty urinating, dysuria, frequency and hematuria.   Musculoskeletal: Positive for chronic back pain and left shoulder pain (stable) Negative for problem, neck pain and neck stiffness.  Skin: Negative for itching and rash.  Neurological: Negative for dizziness, extremity weakness, gait problem, headaches, light-headedness and seizures.  Hematological: Negative for adenopathy. Does not bruise/bleed easily.  Psychiatric/Behavioral: Negative for confusion, depression and sleep disturbance. The patient is not nervous/anxious.    PHYSICAL EXAMINATION:  There were no vitals taken for this visit.  ECOG  PERFORMANCE STATUS: 2  Physical Exam  Constitutional: Oriented to person, place, and time and well-developed, well-nourished, and in no distress.   HENT:  Head: Normocephalic and atraumatic.  Mouth/Throat: Oropharynx is clear and moist. No oropharyngeal exudate.  Eyes: Conjunctivae are normal. Right eye exhibits no discharge. Left eye exhibits no discharge. No scleral icterus.  Neck: Normal range of motion. Neck supple.  Cardiovascular: Normal rate, regular rhythm, normal heart sounds and intact distal pulses.   Pulmonary/Chest: Effort normal and breath sounds normal. No respiratory distress. No wheezes. No rales.  Abdominal: Soft. Bowel sounds are normal. Exhibits no distension and no mass. There is no tenderness.  Musculoskeletal: Normal  range of motion. Exhibits mild edema. Exhibits muscle wasting.  Lymphadenopathy:    No cervical adenopathy.  Neurological: Alert and oriented to person, place, and time. Exhibits muscle wasting. Skin: Skin is warm and dry. No rash noted. Not diaphoretic. No erythema. No pallor.  Psychiatric: Mood, memory and judgment normal.  Vitals reviewed.  LABORATORY DATA: Lab Results  Component Value Date   WBC 10.7 (H) 04/11/2023   HGB 11.1 (L) 04/11/2023   HCT 34.1 (L) 04/11/2023   MCV 90.0 04/11/2023   PLT 184 04/11/2023      Chemistry      Component Value Date/Time   NA 141 04/11/2023 1330   NA 142 07/22/2019 1522   K 4.3 04/11/2023 1330   CL 111 04/11/2023 1330   CO2 25 04/11/2023 1330   BUN 31 (H) 04/11/2023 1330   BUN 13 07/22/2019 1522   CREATININE 1.41 (H) 04/11/2023 1330   CREATININE 0.91 09/16/2011 1000      Component Value Date/Time   CALCIUM 9.0 04/11/2023 1330   ALKPHOS 79 04/11/2023 1330   AST 24 04/11/2023 1330   ALT 16 04/11/2023 1330   BILITOT 0.5 04/11/2023 1330       RADIOGRAPHIC STUDIES:  VAS [place] LOWER EXTREMITY VENOUS (DVT) Result Date: 04/14/2023  Lower Venous DVT Study Patient Name:  [id number] RITA Holmes  Date of Exam:   04/14/2023 Medical Rec #: [id number]               Accession #:    [id number] Date of Birth: [birth year]               Patient Gender: F Patient Age:   77 years Exam Location:  [facility] Procedure:      VAS [place] LOWER EXTREMITY VENOUS (DVT) Referring Phys: HEATHER BOSCIA --------------------------------------------------------------------------------  Indications: Pain, and Swelling.  Risk Factors: Immobility Cancer Lung. Comparison Study: No significant changes seen since previous exam 10/16/22. Performing Technologist: Shona Simpson  Examination Guidelines: A complete evaluation includes B-mode imaging, spectral Doppler, color Doppler, and power Doppler as needed of all accessible portions of each vessel. Bilateral testing is considered  an integral part of a complete examination. Limited examinations for reoccurring indications may be performed as noted. The reflux portion of the exam is performed with the patient in reverse Trendelenburg.  +---------+---------------+---------+-----------+----------+--------------+ RIGHT    CompressibilityPhasicitySpontaneityPropertiesThrombus Aging +---------+---------------+---------+-----------+----------+--------------+ CFV      Full           Yes      Yes                                 +---------+---------------+---------+-----------+----------+--------------+ SFJ      Full                                                        +---------+---------------+---------+-----------+----------+--------------+  FV Prox  Full                                                        +---------+---------------+---------+-----------+----------+--------------+ FV Mid   Full                                                        +---------+---------------+---------+-----------+----------+--------------+ FV DistalFull                                                        +---------+---------------+---------+-----------+----------+--------------+ PFV      Full                                                        +---------+---------------+---------+-----------+----------+--------------+ POP      Full           Yes      Yes                                 +---------+---------------+---------+-----------+----------+--------------+ PTV      Full                                                        +---------+---------------+---------+-----------+----------+--------------+ PERO     Full                                                        +---------+---------------+---------+-----------+----------+--------------+   Right Technical Findings: Avascular fluid mass seen behind right knee measuring 3.8 x 2.1 x 4.9 cm.   +---------+---------------+---------+-----------+----------+--------------+ LEFT     CompressibilityPhasicitySpontaneityPropertiesThrombus Aging +---------+---------------+---------+-----------+----------+--------------+ CFV      Full           Yes      Yes                                 +---------+---------------+---------+-----------+----------+--------------+ SFJ      Full                                                        +---------+---------------+---------+-----------+----------+--------------+ FV Prox  Full                                                        +---------+---------------+---------+-----------+----------+--------------+  FV Mid   Full                                                        +---------+---------------+---------+-----------+----------+--------------+ FV DistalFull                                                        +---------+---------------+---------+-----------+----------+--------------+ PFV      Full                                                        +---------+---------------+---------+-----------+----------+--------------+ POP      Full           Yes      Yes                                 +---------+---------------+---------+-----------+----------+--------------+ PTV      Full                                                        +---------+---------------+---------+-----------+----------+--------------+ PERO     Full                                                        +---------+---------------+---------+-----------+----------+--------------+     Summary: BILATERAL: - No evidence of deep vein thrombosis seen in the lower extremities, bilaterally. - RIGHT: - Findings appear essentially unchanged compared to previous examination. - A cystic structure is found in the popliteal fossa.  LEFT: - No cystic structure found in the popliteal fossa.  *See table(s) above for measurements and  observations. Electronically signed by Carolynn Sayers on 04/14/2023 at 3:15:30 PM.    Final      ASSESSMENT/PLAN:  This is a very pleasant 73 year old African-American female diagnosed with metastatic non-small cell lung cancer, adenocarcinoma.  She was initially diagnosed as a stage IIb (T1b, N1, M0).  She is status post right upper lobectomy with lymph node dissection on September 30, 2019 in [place].  She declined adjuvant systemic chemotherapy at that time.   She is found to have metastatic disease to the bone in November 2023.   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   She underwent palliative radiation to T9 under the care of Dr. Mitzi Hansen.  She is currently on systemic therapy.  She was started on systemic chemotherapy with carboplatin for an AUC of 5, Alimta 500 mg/m, and Keytruda 200 mg IV every 3 weeks.  She is status post 18 cycles.  Starting from cycle #5, she started maintenance treatment with immunotherapy with  Keytruda only.  Alimta was discontinued secondary to renal insufficiency.   Labs were reviewed. Recommend that she proceed with cycle #19 today as schedule.    We will see her back for follow-up visit in 3 weeks for evaluation repeat blood work before undergoing cycle #20  I will arrange for a restaging CT scan of the CAP prior to her next cycle of treatment.  I will order her scan without contrast due to her CKD.  She will continue to follow with her PCP and cardiologist regarding her intermittent mild lower extremity swelling.  Her recent Doppler ultrasound was negative for DVT.  She states compression stockings are too tight causing bruising.  I let her know they may be too tight.  She was advised to avoid salty foods and elevate her lower extremities.  For the mild dry skin and cracking on her fingertips, she is advised to use lotion and moisturizers.  She was advised to hydrate well at home.  The patient was advised to  call immediately if she has any concerning symptoms in the interval. The patient voices understanding of current disease status and treatment options and is in agreement with the current care plan. All questions were answered. The patient knows to call the clinic with any problems, questions or concerns. We can certainly see the patient much sooner if necessary   No orders of the defined types were placed in this encounter.   The total time spent in the appointment was 20-29 minutes  Brandi Vora L Nasha Diss, PA-C 04/29/23

## 2023-05-01 ENCOUNTER — Inpatient Hospital Stay: Payer: Medicare HMO | Attending: Physician Assistant

## 2023-05-01 ENCOUNTER — Inpatient Hospital Stay (HOSPITAL_BASED_OUTPATIENT_CLINIC_OR_DEPARTMENT_OTHER): Payer: Medicare HMO | Admitting: Physician Assistant

## 2023-05-01 ENCOUNTER — Inpatient Hospital Stay: Payer: Medicare HMO

## 2023-05-01 VITALS — BP 135/66 | HR 66 | Resp 18

## 2023-05-01 VITALS — BP 124/66 | HR 81 | Temp 97.7°F | Resp 21 | Wt 152.3 lb

## 2023-05-01 DIAGNOSIS — Z923 Personal history of irradiation: Secondary | ICD-10-CM | POA: Diagnosis not present

## 2023-05-01 DIAGNOSIS — N189 Chronic kidney disease, unspecified: Secondary | ICD-10-CM | POA: Insufficient documentation

## 2023-05-01 DIAGNOSIS — Z5112 Encounter for antineoplastic immunotherapy: Secondary | ICD-10-CM | POA: Diagnosis not present

## 2023-05-01 DIAGNOSIS — C7951 Secondary malignant neoplasm of bone: Secondary | ICD-10-CM | POA: Diagnosis not present

## 2023-05-01 DIAGNOSIS — Z902 Acquired absence of lung [part of]: Secondary | ICD-10-CM | POA: Diagnosis not present

## 2023-05-01 DIAGNOSIS — C3411 Malignant neoplasm of upper lobe, right bronchus or lung: Secondary | ICD-10-CM | POA: Diagnosis not present

## 2023-05-01 LAB — CBC WITH DIFFERENTIAL (CANCER CENTER ONLY)
Abs Immature Granulocytes: 0.03 K/uL (ref 0.00–0.07)
Basophils Absolute: 0.1 K/uL (ref 0.0–0.1)
Basophils Relative: 1 %
Eosinophils Absolute: 2.6 K/uL — ABNORMAL HIGH (ref 0.0–0.5)
Eosinophils Relative: 27 %
HCT: 37.7 % (ref 36.0–46.0)
Hemoglobin: 12.1 g/dL (ref 12.0–15.0)
Immature Granulocytes: 0 %
Lymphocytes Relative: 18 %
Lymphs Abs: 1.7 K/uL (ref 0.7–4.0)
MCH: 28.9 pg (ref 26.0–34.0)
MCHC: 32.1 g/dL (ref 30.0–36.0)
MCV: 90.2 fL (ref 80.0–100.0)
Monocytes Absolute: 0.7 K/uL (ref 0.1–1.0)
Monocytes Relative: 7 %
Neutro Abs: 4.4 K/uL (ref 1.7–7.7)
Neutrophils Relative %: 47 %
Platelet Count: 205 K/uL (ref 150–400)
RBC: 4.18 MIL/uL (ref 3.87–5.11)
RDW: 13.8 % (ref 11.5–15.5)
WBC Count: 9.5 K/uL (ref 4.0–10.5)
nRBC: 0 % (ref 0.0–0.2)

## 2023-05-01 LAB — CMP (CANCER CENTER ONLY)
ALT: 14 U/L (ref 0–44)
AST: 22 U/L (ref 15–41)
Albumin: 3.5 g/dL (ref 3.5–5.0)
Alkaline Phosphatase: 85 U/L (ref 38–126)
Anion gap: 6 (ref 5–15)
BUN: 19 mg/dL (ref 8–23)
CO2: 29 mmol/L (ref 22–32)
Calcium: 9 mg/dL (ref 8.9–10.3)
Chloride: 106 mmol/L (ref 98–111)
Creatinine: 1.4 mg/dL — ABNORMAL HIGH (ref 0.44–1.00)
GFR, Estimated: 39 mL/min — ABNORMAL LOW (ref >60)
Glucose, Bld: 101 mg/dL — ABNORMAL HIGH (ref 70–99)
Potassium: 4 mmol/L (ref 3.5–5.1)
Sodium: 141 mmol/L (ref 135–145)
Total Bilirubin: 0.7 mg/dL (ref 0.0–1.2)
Total Protein: 6.6 g/dL (ref 6.5–8.1)

## 2023-05-01 MED ORDER — SODIUM CHLORIDE 0.9 % IV SOLN: Freq: Once | INTRAVENOUS | Status: AC

## 2023-05-01 MED ORDER — SODIUM CHLORIDE 0.9 % IV SOLN
200.0000 mg | Freq: Once | INTRAVENOUS | Status: AC
  Administered 2023-05-01: 200 mg via INTRAVENOUS
  Filled 2023-05-01: qty 200

## 2023-05-01 NOTE — Patient Instructions (Signed)
 [facility] - A DEPT OF [facility]  Discharge Instructions: Thank you for choosing [facility] to provide your oncology and hematology care.   If you have a lab appointment with the Cancer Center, please go directly to the Cancer Center and check in at the registration area.   Wear comfortable clothing and clothing appropriate for easy access to any Portacath or PICC line.   We strive to give you quality time with your provider. You may need to reschedule your appointment if you arrive late (15 or more minutes).  Arriving late affects you and other patients whose appointments are after yours.  Also, if you miss three or more appointments without notifying the office, you may be dismissed from the clinic at the provider's discretion.      For prescription refill requests, have your pharmacy contact our office and allow 72 hours for refills to be completed.    Today you received the following chemotherapy and/or immunotherapy agents: pembrolizumab      To help prevent nausea and vomiting after your treatment, we encourage you to take your nausea medication as directed.  BELOW ARE SYMPTOMS THAT SHOULD BE REPORTED IMMEDIATELY: *FEVER GREATER THAN 100.4 F (38 C) OR HIGHER *CHILLS OR SWEATING *NAUSEA AND VOMITING THAT IS NOT CONTROLLED WITH YOUR NAUSEA MEDICATION *UNUSUAL SHORTNESS OF BREATH *UNUSUAL BRUISING OR BLEEDING *URINARY PROBLEMS (pain or burning when urinating, or frequent urination) *BOWEL PROBLEMS (unusual diarrhea, constipation, pain near the anus) TENDERNESS IN MOUTH AND THROAT WITH OR WITHOUT PRESENCE OF ULCERS (sore throat, sores in mouth, or a toothache) UNUSUAL RASH, SWELLING OR PAIN  UNUSUAL VAGINAL DISCHARGE OR ITCHING   Items with * indicate a potential emergency and should be followed up as soon as possible or go to the Emergency Department if any problems should occur.  Please show the CHEMOTHERAPY ALERT CARD or  IMMUNOTHERAPY ALERT CARD at check-in to the Emergency Department and triage nurse.  Should you have questions after your visit or need to cancel or reschedule your appointment, please contact [facility]  Dept: [phone]  and follow the prompts.  Office hours are 8:00 a.m. to 4:30 p.m. Monday - Friday. Please note that voicemails left after 4:00 p.m. may not be returned until the following business day.  We are closed weekends and major holidays. You have access to a nurse at all times for urgent questions. Please call the main number to the clinic Dept: [phone] and follow the prompts.   For any non-urgent questions, you may also contact your provider using MyChart. We now offer e-Visits for anyone 55 and older to request care online for non-urgent symptoms. For details visit [email or web address].   Also download the MyChart app! Go to the app store, search "MyChart", open the app, select [facility], and log in with your MyChart username and password.

## 2023-05-07 ENCOUNTER — Encounter: Payer: Self-pay | Admitting: Internal Medicine

## 2023-05-12 ENCOUNTER — Encounter: Payer: Self-pay | Admitting: Internal Medicine

## 2023-05-13 ENCOUNTER — Other Ambulatory Visit: Payer: Self-pay | Admitting: Physician Assistant

## 2023-05-15 ENCOUNTER — Ambulatory Visit (HOSPITAL_COMMUNITY)
Admission: RE | Admit: 2023-05-15 | Discharge: 2023-05-15 | Disposition: A | Discharge status: Home / Self Care | Payer: Medicare HMO | Source: Ambulatory Visit | Attending: Physician Assistant | Admitting: Physician Assistant

## 2023-05-15 DIAGNOSIS — C7951 Secondary malignant neoplasm of bone: Secondary | ICD-10-CM | POA: Insufficient documentation

## 2023-05-22 ENCOUNTER — Inpatient Hospital Stay: Payer: Medicare HMO | Attending: Physician Assistant

## 2023-05-22 ENCOUNTER — Inpatient Hospital Stay (HOSPITAL_BASED_OUTPATIENT_CLINIC_OR_DEPARTMENT_OTHER): Payer: Medicare HMO | Admitting: Internal Medicine

## 2023-05-22 ENCOUNTER — Inpatient Hospital Stay: Payer: Medicare HMO

## 2023-05-22 VITALS — BP 112/67 | HR 63 | Temp 98.1°F | Resp 19 | Ht 66.0 in | Wt 156.2 lb

## 2023-05-22 DIAGNOSIS — D649 Anemia, unspecified: Secondary | ICD-10-CM | POA: Diagnosis not present

## 2023-05-22 DIAGNOSIS — Z79620 Long term (current) use of immunosuppressive biologic: Secondary | ICD-10-CM | POA: Diagnosis not present

## 2023-05-22 DIAGNOSIS — C3411 Malignant neoplasm of upper lobe, right bronchus or lung: Secondary | ICD-10-CM | POA: Insufficient documentation

## 2023-05-22 DIAGNOSIS — K5900 Constipation, unspecified: Secondary | ICD-10-CM | POA: Insufficient documentation

## 2023-05-22 DIAGNOSIS — C7951 Secondary malignant neoplasm of bone: Secondary | ICD-10-CM | POA: Diagnosis not present

## 2023-05-22 DIAGNOSIS — Z5112 Encounter for antineoplastic immunotherapy: Secondary | ICD-10-CM | POA: Diagnosis present

## 2023-05-22 LAB — CMP (CANCER CENTER ONLY)
ALT: 17 U/L (ref 0–44)
AST: 22 U/L (ref 15–41)
Albumin: 3.7 g/dL (ref 3.5–5.0)
Alkaline Phosphatase: 80 U/L (ref 38–126)
Anion gap: 3 — ABNORMAL LOW (ref 5–15)
BUN: 25 mg/dL — ABNORMAL HIGH (ref 8–23)
CO2: 29 mmol/L (ref 22–32)
Calcium: 9.1 mg/dL (ref 8.9–10.3)
Chloride: 106 mmol/L (ref 98–111)
Creatinine: 1.43 mg/dL — ABNORMAL HIGH (ref 0.44–1.00)
GFR, Estimated: 38 mL/min — ABNORMAL LOW (ref >60)
Glucose, Bld: 83 mg/dL (ref 70–99)
Potassium: 4.5 mmol/L (ref 3.5–5.1)
Sodium: 138 mmol/L (ref 135–145)
Total Bilirubin: 0.6 mg/dL (ref 0.0–1.2)
Total Protein: 6.8 g/dL (ref 6.5–8.1)

## 2023-05-22 LAB — CBC WITH DIFFERENTIAL (CANCER CENTER ONLY)
Abs Immature Granulocytes: 0.02 10*3/uL (ref 0.00–0.07)
Basophils Absolute: 0.1 10*3/uL (ref 0.0–0.1)
Basophils Relative: 1 %
Eosinophils Absolute: 1.8 10*3/uL — ABNORMAL HIGH (ref 0.0–0.5)
Eosinophils Relative: 21 %
HCT: 36.8 % (ref 36.0–46.0)
Hemoglobin: 11.9 g/dL — ABNORMAL LOW (ref 12.0–15.0)
Immature Granulocytes: 0 %
Lymphocytes Relative: 27 %
Lymphs Abs: 2.3 10*3/uL (ref 0.7–4.0)
MCH: 29.5 pg (ref 26.0–34.0)
MCHC: 32.3 g/dL (ref 30.0–36.0)
MCV: 91.3 fL (ref 80.0–100.0)
Monocytes Absolute: 0.8 10*3/uL (ref 0.1–1.0)
Monocytes Relative: 9 %
Neutro Abs: 3.6 10*3/uL (ref 1.7–7.7)
Neutrophils Relative %: 42 %
Platelet Count: 192 10*3/uL (ref 150–400)
RBC: 4.03 MIL/uL (ref 3.87–5.11)
RDW: 13.4 % (ref 11.5–15.5)
WBC Count: 8.6 10*3/uL (ref 4.0–10.5)
nRBC: 0 % (ref 0.0–0.2)

## 2023-05-22 LAB — TSH: TSH: 0.686 u[IU]/mL (ref 0.350–4.500)

## 2023-05-22 MED ORDER — SODIUM CHLORIDE 0.9 % IV SOLN
200.0000 mg | Freq: Once | INTRAVENOUS | Status: AC
  Administered 2023-05-22: 200 mg via INTRAVENOUS
  Filled 2023-05-22: qty 200

## 2023-05-22 MED ORDER — SODIUM CHLORIDE 0.9 % IV SOLN: Freq: Once | INTRAVENOUS | Status: AC

## 2023-05-22 NOTE — Patient Instructions (Signed)

## 2023-05-22 NOTE — Progress Notes (Signed)
 Brandi Holmes [phone]   [phone]  OFFICE PROGRESS NOTE  Brandi Crigler, MD [address] [place] [address]  DIAGNOSIS: Metastatic non-small cell lung cancer, adenocarcinoma that was initially diagnosed as stage IIb (T1b, N1, M0) non-small cell lung cancer, adenocarcinoma presented with right upper lobe lung nodule in June 2021.   Detected Alteration(s) / Biomarker(s) Associated FDA-approved therapies Clinical Trial Availability % cfDNA or Amplification BRAF V600E approved by FDA Dabrafenib+trametinib, Encorafenib+binimetinib approved in other indication Cobimetinib, Dabrafenib, Trametinib, Vemurafenib, Vemurafenib+cobimetinib Yes 8.3%  BRCA1 R252S None (VUS) None (VUS) 0.1%   SMAD4 D456fs None None 7.2%  PD-L1 expression 35%  PRIOR THERAPY:  1) Status post right upper lobectomy with lymph node dissection on September 30, 2019 in [place] .  She declines adjuvant systemic chemotherapy. 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 Dewey.  CURRENT THERAPY: Systemic chemotherapy with carboplatin  for AUC of 5, Alimta  500 Mg/M2 and Keytruda  200 Mg IV every 3 weeks.  First dose April 16, 2022.  Status post 19 cycles.  Starting cycle #5 she will be on maintenance treatment with Keytruda  every 3 weeks.  INTERVAL HISTORY: Brandi Holmes 73 y.o. female returns to the clinic today for follow-up visit.   Brandi Holmes is a 73 year old female with metastatic non-small cell lung cancer adenocarcinoma who presents for cycle number twenty of maintenance treatment with Keytruda .  She was diagnosed with metastatic non-small cell lung cancer adenocarcinoma in December 2023, following a previous diagnosis of stage 2B. She has completed nineteen cycles of treatment and is here for her twentieth cycle of maintenance  treatment with Keytruda .  A recent scan from last week shows no evidence of progression or spread of her tumor, with stable bone lesions and a decrease in the size of previously prominent inguinal lymph nodes. The lymph node in the groin area, which was previously painful, is now getting smaller and less painful with her current pain management regimen.  She feels well overall, with effective pain management using Lyrica  and tramadol . She experiences no nausea and rests well. However, she is working on managing constipation, which she finds challenging.  She has a history of anemia, which she has been addressing by consuming liver and onions approximately every two weeks. She notes that her anemia is improving, stating 'I think my anemia's going away, kind of.'   MEDICAL HISTORY: Past Medical History:  Diagnosis Date   Anemia    as a child only   Coronary artery calcification of native artery    Hyperlipidemia    Hypertension    Liver spots     per pt   Lung nodule    Wears glasses    Wears partial dentures     ALLERGIES:  is allergic to crab [shellfish allergy] and morphine  and codeine.  MEDICATIONS:  Current Outpatient Medications  Medication Sig Dispense Refill   aspirin  EC 81 MG tablet Take 81 mg by mouth at bedtime. Swallow whole.     atorvastatin  (LIPITOR) 20 MG tablet Take 20 mg by mouth at bedtime.     folic acid  (FOLVITE ) 1 MG tablet TAKE ONE TABLET BY MOUTH EVERY DAY 30 tablet 0   furosemide (LASIX) 20 MG tablet Take 20 mg by mouth daily.     lactose free nutrition (BOOST) LIQD Take 237 mLs by mouth 2 (two)  times daily between meals.     latanoprost (XALATAN) 0.005 % ophthalmic solution Place 1 drop into both eyes at bedtime.     lisinopril  (ZESTRIL ) 10 MG tablet Take 1 tablet (10 mg total) by mouth every morning. 90 tablet 0   magnesium  citrate SOLN Take 1 Bottle by mouth once a week.     OVER THE COUNTER MEDICATION Prune X -2-3 x week     pregabalin  (LYRICA ) 150  MG capsule Take 150 mg by mouth 2 (two) times daily.     prochlorperazine  (COMPAZINE ) 10 MG tablet Take 1 tablet (10 mg total) by mouth every 6 (six) hours as needed for nausea or vomiting. 30 tablet 0   tiZANidine  (ZANAFLEX ) 2 MG tablet Take 2 mg by mouth at bedtime.     traMADol  HCl 100 MG TABS Take 1 tablet by mouth QID. 1 tablet by mouth every 6 hours as needed for pain     No current facility-administered medications for this visit.    SURGICAL HISTORY:  Past Surgical History:  Procedure Laterality Date   ABDOMINAL HYSTERECTOMY  1974   heavy bleeding   ABDOMINAL HYSTERECTOMY     BRONCHIAL BRUSHINGS  08/13/2019   Procedure: BRONCHIAL BRUSHINGS;  Surgeon: Brandi Adine CROME, DO;  Location: [facility];  Service: Pulmonary;;   BRONCHIAL WASHINGS  08/13/2019   Procedure: BRONCHIAL WASHINGS;  Surgeon: Brandi Adine CROME, DO;  Location: [facility];  Service: Pulmonary;;   COLONOSCOPY W/ BIOPSIES AND POLYPECTOMY     DILATION AND CURETTAGE OF UTERUS     FINE NEEDLE ASPIRATION  08/13/2019   Procedure: FINE NEEDLE ASPIRATION (FNA) LINEAR;  Surgeon: Brandi Adine CROME, DO;  Location: [facility];  Service: Pulmonary;;   FOOT SURGERY     bilateral bunions and hammer toes   IR RADIOLOGIST EVAL & MGMT  05/20/2022   LUNG BIOPSY  08/13/2019   Procedure: LUNG BIOPSY;  Surgeon: Brandi Adine CROME, DO;  Location: [facility];  Service: Pulmonary;;   MULTIPLE TOOTH EXTRACTIONS     VIDEO BRONCHOSCOPY WITH ENDOBRONCHIAL NAVIGATION Right 08/13/2019   Procedure: VIDEO BRONCHOSCOPY WITH ENDOBRONCHIAL NAVIGATION;  Surgeon: Brandi Adine CROME, DO;  Location: [facility];  Service: Pulmonary;  Laterality: Right;   VIDEO BRONCHOSCOPY WITH ENDOBRONCHIAL ULTRASOUND Right 08/13/2019   Procedure: VIDEO BRONCHOSCOPY WITH ENDOBRONCHIAL ULTRASOUND;  Surgeon: Brandi Adine CROME, DO;  Location: [facility];  Service: Pulmonary;  Laterality: Right;    REVIEW OF SYSTEMS:  Constitutional: negative Eyes: negative Ears, nose,  mouth, throat, and face: negative Respiratory: negative Cardiovascular: negative Gastrointestinal: negative Genitourinary:negative Integument/breast: negative Hematologic/lymphatic: negative Musculoskeletal:positive for arthralgias Neurological: negative Behavioral/Psych: negative Endocrine: negative Allergic/Immunologic: negative   PHYSICAL EXAMINATION: General appearance: alert, cooperative, and no distress Head: Normocephalic, without obvious abnormality, atraumatic Neck: no adenopathy, no JVD, supple, symmetrical, trachea midline, and thyroid  not enlarged, symmetric, no tenderness/mass/nodules Lymph nodes: Cervical, supraclavicular, and axillary nodes normal. Resp: clear to auscultation bilaterally Back: symmetric, no curvature. ROM normal. No CVA tenderness. Cardio: regular rate and rhythm, S1, S2 normal, no murmur, click, rub or gallop GI: soft, non-tender; bowel sounds normal; no masses,  no organomegaly Extremities: extremities normal, atraumatic, no cyanosis or edema Neurologic: Alert and oriented X 3, normal strength and tone. Normal symmetric reflexes. Normal coordination and gait  ECOG PERFORMANCE STATUS: 1 - Symptomatic but completely ambulatory  Blood pressure 112/67, pulse 63, temperature 98.1 F (36.7 C), temperature source Temporal, resp. rate 19, height 5' 6 (1.676 m), weight 156 lb 3.2 oz (70.9 kg), SpO2 98%.  LABORATORY DATA: Lab Results  Component Value Date   WBC 8.6 05/22/2023   HGB 11.9 (L) 05/22/2023   HCT 36.8 05/22/2023   MCV 91.3 05/22/2023   PLT 192 05/22/2023      Chemistry      Component Value Date/Time   NA 138 05/22/2023 1418   NA 142 07/22/2019 1522   K 4.5 05/22/2023 1418   CL 106 05/22/2023 1418   CO2 29 05/22/2023 1418   BUN 25 (H) 05/22/2023 1418   BUN 13 07/22/2019 1522   CREATININE 1.43 (H) 05/22/2023 1418   CREATININE 0.91 09/16/2011 1000      Component Value Date/Time   CALCIUM  9.1 05/22/2023 1418   ALKPHOS 80  05/22/2023 1418   AST 22 05/22/2023 1418   ALT 17 05/22/2023 1418   BILITOT 0.6 05/22/2023 1418       RADIOGRAPHIC STUDIES: CT CHEST ABDOMEN PELVIS WO CONTRAST Result Date: 05/16/2023 CLINICAL DATA:  Metastatic disease evaluation history of rectosigmoid cancer status post resection * Tracking Code: BO * EXAM: CT CHEST, ABDOMEN AND PELVIS WITHOUT CONTRAST TECHNIQUE: Multidetector CT imaging of the chest, abdomen and pelvis was performed following the standard protocol without IV contrast. RADIATION DOSE REDUCTION: This exam was performed according to the departmental dose-optimization program which includes automated exposure control, adjustment of the mA and/or kV according to patient size and/or use of iterative reconstruction technique. COMPARISON:  Multiple priors including CT March 14, 2023 FINDINGS: CT CHEST FINDINGS Cardiovascular: Aortic atherosclerosis. Normal size heart. Three-vessel coronary artery calcifications. Similar large pericardial effusion. Mediastinum/Nodes: No suspicious thyroid  nodule. No pathologically enlarged mediastinal, hilar or axillary lymph node on noncontrast enhanced examination. The esophagus is grossly unremarkable. Similar rightward deviation of the mediastinum. Lungs/Pleura: Surgical changes of right upper lobectomy. Subpleural fibrosis in the right lung predominantly posterior and medially extending from the level of the sixth rib into the lung base is not significantly changed in the interval. No new suspicious nodularity. Mild subpleural reticulations in the left lung similar prior. No new suspicious pulmonary nodules or masses. Musculoskeletal: Multifocal osseous metastatic lesions throughout the thoracic spine including the T1, T2, T3, T8, T9 and T10 levels with involvement of some adjacent ribs most notably the expansile lesion involving the right ninth rib, findings are overall not significantly changed from prior examination. No new suspicious lytic or blastic  lesion of bone. Again seen are compression deformities involving T7, T8, T9, T10 and T11. No new compression deformity identified. CT ABDOMEN PELVIS FINDINGS Hepatobiliary: Stable scattered low-attenuation lesions throughout the liver, favored to reflect cysts. No new discrete masslike hepatic lesion identified. Gallbladder is unremarkable. No biliary ductal dilation. Pancreas: No pancreatic ductal dilation or evidence of acute inflammation. Spleen: No splenomegaly. Adrenals/Urinary Tract: Similar lobular thickening of the left adrenal gland. Right adrenal gland appears normal. No hydronephrosis. Right renal cortical calcifications similar prior. Underlying hypoenhancing lesion from CT 11/11/2022 is not well seen on today's noncontrast examination. Urinary bladder is unremarkable for degree of distension. Stomach/Bowel: No radiopaque enteric contrast material was administered. Stomach is unremarkable for degree of distension. No pathologic dilation of small or large bowel. No evidence of acute bowel inflammation. Vascular/Lymphatic: Aortic atherosclerosis. Normal caliber abdominal aorta. Smooth IVC contours. No pathologically enlarged abdominal or pelvic lymph nodes. Prominent inguinal lymph nodes have decreased in the interval now measuring up to 7 mm in short axis on the left on image 102/2 and 8 mm in short axis on the right on image 117/2 formally 11 mm and 12 mm respectively.  Reproductive: Status post hysterectomy. No adnexal masses. Other: No significant abdominopelvic free fluid. Musculoskeletal: Similar appearance of the sclerotic bony metastasis involving the L3, L4 and L5 vertebral bodies with extension into posterior elements no new aggressive lytic or blastic lesion of bone. IMPRESSION: 1. No evidence of new or progressive disease within the chest, abdomen or pelvis. 2. Similar appearance of multifocal osseous metastatic disease throughout the thoracic and lumbar spine. 3. Similar large pericardial  effusion. 4. Similar lobular thickening of the left adrenal gland. 5. Decreased size of the prominent inguinal lymph nodes, favored reactive. 6.  Aortic Atherosclerosis (ICD10-I70.0). Electronically Signed   By: Reyes Holder M.D.   On: 05/16/2023 07:46     ASSESSMENT AND PLAN: This is a pleasant 73 years old African-American female diagnosed with metastatic also lung cancer, adenocarcinoma initially diagnosed as stage IIb (T1b, N1, M0) non-small cell lung cancer, adenocarcinoma status post a right upper lobectomy with lymph node dissection on September 30, 2019 in [place] .  She declined adjuvant systemic chemotherapy at that time. She was found to have evidence for disease metastasis in November 2023 presenting with multiple metastatic bone lesions. The patient had molecular studies that showed an actionable mutation with BRAF V600E mutation and PD-L1 expression of 35%. She underwent palliative radiotherapy to the T9 vertebral lesion under the care of Dr. Dewey. The patient is currently undergoing first-line combination of systemic chemotherapy with carboplatin  for AUC of 5, Alimta  500 Mg/M2 and Keytruda  200 Mg IV every 3 weeks.  Status post 19 cycles.  Starting from cycle #5 she will be on maintenance treatment with Alimta  and Keytruda  every 3 weeks.  Starting from cycle #5 she will be on single agent Keytruda  only.  Alimta  will be discontinued secondary to renal insufficiency. The patient has been tolerating this treatment fairly well with no concerning adverse effects.  Metastatic Non-Small Cell Lung Cancer (NSCLC) - Adenocarcinoma Diagnosed in December 2023. Currently undergoing maintenance treatment with Keytruda . Recent scan shows no evidence of new or progressive disease within the chest, abdomen, or pelvis. Bone lesions are stable, and inguinal lymph nodes have decreased in size. Pain is well-managed with Lyrica  and tramadol . Patient consents to continue current treatment plan. - Proceed with cycle  number 20 of Keytruda  - Schedule follow-up in three weeks  Pain Management Pain is well-managed with Lyrica  and tramadol , prescribed by Dr. Dickey Reese. Patient reports effective pain control. - Continue current pain management regimen  Anemia Anemia improving with dietary changes, including consumption of liver. Patient reports feeling better and believes anemia is resolving. - Continue dietary modifications to manage anemia  Constipation Ongoing issues with constipation, actively managed. - Continue current management for constipation.  The patient voices understanding of current disease status and treatment options and is in agreement with the current care plan. The total time spent in the appointment was 30 minutes.  All questions were answered. The patient knows to call the clinic with any problems, questions or concerns. We can certainly see the patient much sooner if necessary.  Disclaimer: This note was dictated with voice recognition software. Similar sounding words can inadvertently be transcribed and may not be corrected upon review.

## 2023-05-23 LAB — T4: T4, Total: 5.4 ug/dL (ref 4.5–12.0)

## 2023-06-04 ENCOUNTER — Ambulatory Visit (HOSPITAL_COMMUNITY): Payer: Medicare HMO

## 2023-06-09 NOTE — Progress Notes (Deleted)
 [facility] OFFICE PROGRESS NOTE  Brandi Able, MD [address] [place] [address]  DIAGNOSIS: Metastatic non-small cell lung cancer, adenocarcinoma that was initially diagnosed as stage IIb (T1b, N1, M0) non-small cell lung cancer, adenocarcinoma presented with right upper lobe lung nodule   Detected Alteration(s) / Biomarker(s) Associated FDA-approved therapiesClinical Trial Availability% cfDNA or Amplification BRAF [id number] approved by FDA Dabrafenib+trametinib, Encorafenib+binimetinib approved in other indication Cobimetinib, Dabrafenib, Trametinib, Vemurafenib, Vemurafenib+cobimetinib Yes8.3%   BRCA1 R252S None (VUS) None (VUS) 0.1%     SMAD4 D464fs None None 7.2%   PD-L1 expression 35%  PRIOR THERAPY: 1) Status post right upper lobectomy with lymph node dissection on September 30, 2019 in [place].  She declines adjuvant systemic chemotherapy. 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 Mitzi Hansen.  CURRENT THERAPY: Systemic chemotherapy with carboplatin for AUC of 5, Alimta 500 Mg/M2 and Keytruda 200 Mg IV every 3 weeks. First dose April 16, 2022. Status post 20 cycles. Starting cycle #5 she will be on maintenance treatment with Keytruda every 3 weeks.   INTERVAL HISTORY: Brandi Holmes 73 y.o. female returns to the clinic for a follow-up visit. The patient is feeling fairly today without any concerning complaints. She was last seen by Dr. Arbutus Ped 3 weeks ago.   Lyrica and tramadol for pain. This is prescribed by her PCP.   She is currently undergoing maintenance treatment with immunotherapy with Keytruda.  She is tolerating this well.  Today, she denies any fever, chills, night sweats, or unexplained weight loss. She states she has been gaining a pound every 3 weeks.  Denies any changes with her breathing since last being seen.  She typically  reports she may have cough due to phelgm. Denies any nausea, vomiting, or diarrhea.  She struggles with chronic constipation but states this is under control. Denies any headaches or visual changes.  He does have some dry skin and cracking near her fingertips.  Denies any rashes or skin changes otherwise. She is here for evaluation and repeat blood work before undergoing cycle #21  MEDICAL HISTORY: Past Medical History:  Diagnosis Date   Anemia    as a child only   Coronary artery calcification of native artery    Hyperlipidemia    Hypertension    Liver spots    " per pt"   Lung nodule    Wears glasses    Wears partial dentures     ALLERGIES:  is allergic to crab [shellfish allergy] and morphine and codeine.  MEDICATIONS:  Current Outpatient Medications  Medication Sig Dispense Refill   aspirin EC 81 MG tablet Take 81 mg by mouth at bedtime. Swallow whole.     atorvastatin (LIPITOR) 20 MG tablet Take 20 mg by mouth at bedtime.     folic acid (FOLVITE) 1 MG tablet TAKE ONE TABLET BY MOUTH EVERY DAY 30 tablet 0   furosemide (LASIX) 20 MG tablet Take 20 mg by mouth daily.     lactose free nutrition (BOOST) LIQD Take 237 mLs by mouth 2 (two) times daily between meals.     latanoprost (XALATAN) 0.005 % ophthalmic solution Place 1 drop into both eyes at bedtime.     lisinopril (ZESTRIL) 10 MG tablet Take 1 tablet (10 mg total) by mouth every morning. 90 tablet 0   magnesium citrate SOLN Take 1 Bottle by mouth once a week.  OVER THE COUNTER MEDICATION "Prune X" -2-3 x week     pregabalin (LYRICA) 150 MG capsule Take 150 mg by mouth 2 (two) times daily.     prochlorperazine (COMPAZINE) 10 MG tablet Take 1 tablet (10 mg total) by mouth every 6 (six) hours as needed for nausea or vomiting. 30 tablet 0   tiZANidine (ZANAFLEX) 2 MG tablet Take 2 mg by mouth at bedtime.     traMADol HCl 100 MG TABS Take 1 tablet by mouth QID. 1 tablet by mouth every 6 hours as needed for pain     No current  facility-administered medications for this visit.    SURGICAL HISTORY:  Past Surgical History:  Procedure Laterality Date   ABDOMINAL HYSTERECTOMY  1974   heavy bleeding   ABDOMINAL HYSTERECTOMY     BRONCHIAL BRUSHINGS  08/13/2019   Procedure: BRONCHIAL BRUSHINGS;  Surgeon: Josephine Igo, DO;  Location: [facility];  Service: Pulmonary;;   BRONCHIAL WASHINGS  08/13/2019   Procedure: BRONCHIAL WASHINGS;  Surgeon: Josephine Igo, DO;  Location: [facility];  Service: Pulmonary;;   COLONOSCOPY W/ BIOPSIES AND POLYPECTOMY     DILATION AND CURETTAGE OF UTERUS     FINE NEEDLE ASPIRATION  08/13/2019   Procedure: FINE NEEDLE ASPIRATION (FNA) LINEAR;  Surgeon: Josephine Igo, DO;  Location: [facility];  Service: Pulmonary;;   FOOT SURGERY     bilateral bunions and hammer toes   IR RADIOLOGIST EVAL & MGMT  05/20/2022   LUNG BIOPSY  08/13/2019   Procedure: LUNG BIOPSY;  Surgeon: Josephine Igo, DO;  Location: [facility];  Service: Pulmonary;;   MULTIPLE TOOTH EXTRACTIONS     VIDEO BRONCHOSCOPY WITH ENDOBRONCHIAL NAVIGATION Right 08/13/2019   Procedure: VIDEO BRONCHOSCOPY WITH ENDOBRONCHIAL NAVIGATION;  Surgeon: Josephine Igo, DO;  Location: [facility];  Service: Pulmonary;  Laterality: Right;   VIDEO BRONCHOSCOPY WITH ENDOBRONCHIAL ULTRASOUND Right 08/13/2019   Procedure: VIDEO BRONCHOSCOPY WITH ENDOBRONCHIAL ULTRASOUND;  Surgeon: Josephine Igo, DO;  Location: [facility];  Service: Pulmonary;  Laterality: Right;    REVIEW OF SYSTEMS:   Review of Systems  Constitutional: Negative for appetite change, chills, fatigue, fever and unexpected weight change.  HENT:   Negative for mouth sores, nosebleeds, sore throat and trouble swallowing.   Eyes: Negative for eye problems and icterus.  Respiratory: Negative for cough, hemoptysis, shortness of breath and wheezing.   Cardiovascular: Negative for chest pain and leg swelling.  Gastrointestinal: Negative for abdominal pain,  constipation, diarrhea, nausea and vomiting.  Genitourinary: Negative for bladder incontinence, difficulty urinating, dysuria, frequency and hematuria.   Musculoskeletal: Negative for back pain, gait problem, neck pain and neck stiffness.  Skin: Negative for itching and rash.  Neurological: Negative for dizziness, extremity weakness, gait problem, headaches, light-headedness and seizures.  Hematological: Negative for adenopathy. Does not bruise/bleed easily.  Psychiatric/Behavioral: Negative for confusion, depression and sleep disturbance. The patient is not nervous/anxious.     PHYSICAL EXAMINATION:  There were no vitals taken for this visit.  ECOG PERFORMANCE STATUS: {CHL ONC ECOG [id number]  Physical Exam  Constitutional: Oriented to person, place, and time and well-developed, well-nourished, and in no distress. No distress.  HENT:  Head: Normocephalic and atraumatic.  Mouth/Throat: Oropharynx is clear and moist. No oropharyngeal exudate.  Eyes: Conjunctivae are normal. Right eye exhibits no discharge. Left eye exhibits no discharge. No scleral icterus.  Neck: Normal range of motion. Neck supple.  Cardiovascular: Normal rate, regular rhythm, normal heart sounds and intact distal pulses.  Pulmonary/Chest: Effort normal and breath sounds normal. No respiratory distress. No wheezes. No rales.  Abdominal: Soft. Bowel sounds are normal. Exhibits no distension and no mass. There is no tenderness.  Musculoskeletal: Normal range of motion. Exhibits no edema.  Lymphadenopathy:    No cervical adenopathy.  Neurological: Alert and oriented to person, place, and time. Exhibits normal muscle tone. Gait normal. Coordination normal.  Skin: Skin is warm and dry. No rash noted. Not diaphoretic. No erythema. No pallor.  Psychiatric: Mood, memory and judgment normal.  Vitals reviewed.  LABORATORY DATA: Lab Results  Component Value Date   WBC 8.6 05/22/2023   HGB 11.9 (L) 05/22/2023   HCT  36.8 05/22/2023   MCV 91.3 05/22/2023   PLT 192 05/22/2023      Chemistry      Component Value Date/Time   NA 138 05/22/2023 1418   NA 142 07/22/2019 1522   K 4.5 05/22/2023 1418   CL 106 05/22/2023 1418   CO2 29 05/22/2023 1418   BUN 25 (H) 05/22/2023 1418   BUN 13 07/22/2019 1522   CREATININE 1.43 (H) 05/22/2023 1418   CREATININE 0.91 09/16/2011 1000      Component Value Date/Time   CALCIUM 9.1 05/22/2023 1418   ALKPHOS 80 05/22/2023 1418   AST 22 05/22/2023 1418   ALT 17 05/22/2023 1418   BILITOT 0.6 05/22/2023 1418       RADIOGRAPHIC STUDIES:  CT CHEST ABDOMEN PELVIS WO CONTRAST Result Date: 05/16/2023 CLINICAL DATA:  Metastatic disease evaluation history of rectosigmoid cancer status post resection * Tracking Code: BO * EXAM: CT CHEST, ABDOMEN AND PELVIS WITHOUT CONTRAST TECHNIQUE: Multidetector CT imaging of the chest, abdomen and pelvis was performed following the standard protocol without IV contrast. RADIATION DOSE REDUCTION: This exam was performed according to the departmental dose-optimization program which includes automated exposure control, adjustment of the mA and/or kV according to patient size and/or use of iterative reconstruction technique. COMPARISON:  Multiple priors including CT March 14, 2023 FINDINGS: CT CHEST FINDINGS Cardiovascular: Aortic atherosclerosis. Normal size heart. Three-vessel coronary artery calcifications. Similar large pericardial effusion. Mediastinum/Nodes: No suspicious thyroid nodule. No pathologically enlarged mediastinal, hilar or axillary lymph node on noncontrast enhanced examination. The esophagus is grossly unremarkable. Similar rightward deviation of the mediastinum. Lungs/Pleura: Surgical changes of right upper lobectomy. Subpleural fibrosis in the right lung predominantly posterior and medially extending from the level of the sixth rib into the lung base is not significantly changed in the interval. No new suspicious nodularity.  Mild subpleural reticulations in the left lung similar prior. No new suspicious pulmonary nodules or masses. Musculoskeletal: Multifocal osseous metastatic lesions throughout the thoracic spine including the T1, T2, T3, T8, T9 and T10 levels with involvement of some adjacent ribs most notably the expansile lesion involving the right ninth rib, findings are overall not significantly changed from prior examination. No new suspicious lytic or blastic lesion of bone. Again seen are compression deformities involving T7, T8, T9, T10 and T11. No new compression deformity identified. CT ABDOMEN PELVIS FINDINGS Hepatobiliary: Stable scattered low-attenuation lesions throughout the liver, favored to reflect cysts. No new discrete masslike hepatic lesion identified. Gallbladder is unremarkable. No biliary ductal dilation. Pancreas: No pancreatic ductal dilation or evidence of acute inflammation. Spleen: No splenomegaly. Adrenals/Urinary Tract: Similar lobular thickening of the left adrenal gland. Right adrenal gland appears normal. No hydronephrosis. Right renal cortical calcifications similar prior. Underlying hypoenhancing lesion from CT 11/11/2022 is not well seen on today's noncontrast examination. Urinary bladder is unremarkable  for degree of distension. Stomach/Bowel: No radiopaque enteric contrast material was administered. Stomach is unremarkable for degree of distension. No pathologic dilation of small or large bowel. No evidence of acute bowel inflammation. Vascular/Lymphatic: Aortic atherosclerosis. Normal caliber abdominal aorta. Smooth IVC contours. No pathologically enlarged abdominal or pelvic lymph nodes. Prominent inguinal lymph nodes have decreased in the interval now measuring up to 7 mm in short axis on the left on image 102/2 and 8 mm in short axis on the right on image 117/2 formally 11 mm and 12 mm respectively. Reproductive: Status post hysterectomy. No adnexal masses. Other: No significant  abdominopelvic free fluid. Musculoskeletal: Similar appearance of the sclerotic bony metastasis involving the L3, L4 and L5 vertebral bodies with extension into posterior elements no new aggressive lytic or blastic lesion of bone. IMPRESSION: 1. No evidence of new or progressive disease within the chest, abdomen or pelvis. 2. Similar appearance of multifocal osseous metastatic disease throughout the thoracic and lumbar spine. 3. Similar large pericardial effusion. 4. Similar lobular thickening of the left adrenal gland. 5. Decreased size of the prominent inguinal lymph nodes, favored reactive. 6.  Aortic Atherosclerosis (ICD10-I70.0). Electronically Signed   By: Maudry Mayhew M.D.   On: 05/16/2023 07:46     ASSESSMENT/PLAN:  This is a very pleasant 72 year old African-American female diagnosed with metastatic non-small cell lung cancer, adenocarcinoma.  She was initially diagnosed as a stage IIb (T1b, N1, M0).  She is status post right upper lobectomy with lymph node dissection on September 30, 2019 in [place].  She declined adjuvant systemic chemotherapy at that time.   She is found to have metastatic disease to the bone in November 2023.   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   She underwent palliative radiation to T9 under the care of Dr. Mitzi Hansen.  She is currently on systemic therapy.  She was started on systemic chemotherapy with carboplatin for an AUC of 5, Alimta 500 mg/m, and Keytruda 200 mg IV every 3 weeks.  She is status post 20 cycles.  Starting from cycle #5, she started maintenance treatment with immunotherapy with Keytruda only.  Alimta was discontinued secondary to renal insufficiency.   Labs were reviewed. Recommend that she proceed with cycle #21 today as scheduled    We will see her back for follow-up visit in 3 weeks for evaluation repeat blood work before undergoing cycle #22.  The patient was advised to call immediately if she  has any concerning symptoms in the interval. The patient voices understanding of current disease status and treatment options and is in agreement with the current care plan. All questions were answered. The patient knows to call the clinic with any problems, questions or concerns. We can certainly see the patient much sooner if necessary    No orders of the defined types were placed in this encounter.    I spent {CHL ONC TIME VISIT - ZHYQM:[id number]} counseling the patient face to face. The total time spent in the appointment was {CHL ONC TIME VISIT - MWUXL:[id number]}.  Ziyon Cedotal L Elodie Panameno, PA-C 06/12/23

## 2023-06-12 ENCOUNTER — Inpatient Hospital Stay: Payer: Medicare HMO | Admitting: Physician Assistant

## 2023-06-12 ENCOUNTER — Inpatient Hospital Stay: Payer: Medicare HMO

## 2023-06-12 ENCOUNTER — Encounter: Payer: Self-pay | Admitting: Internal Medicine

## 2023-06-12 ENCOUNTER — Other Ambulatory Visit: Payer: Self-pay | Admitting: Physician Assistant

## 2023-06-12 DIAGNOSIS — C7951 Secondary malignant neoplasm of bone: Secondary | ICD-10-CM

## 2023-06-17 ENCOUNTER — Ambulatory Visit (HOSPITAL_COMMUNITY): Payer: Medicare HMO | Attending: Cardiology

## 2023-06-17 DIAGNOSIS — I3139 Other pericardial effusion (noninflammatory): Secondary | ICD-10-CM | POA: Diagnosis present

## 2023-06-17 LAB — ECHOCARDIOGRAM LIMITED
Area-P 1/2: 2.95 cm2
S' Lateral: 2.7 cm

## 2023-06-18 ENCOUNTER — Other Ambulatory Visit: Payer: Self-pay | Admitting: Medical Oncology

## 2023-06-18 DIAGNOSIS — C7951 Secondary malignant neoplasm of bone: Secondary | ICD-10-CM

## 2023-06-18 DIAGNOSIS — Z5112 Encounter for antineoplastic immunotherapy: Secondary | ICD-10-CM

## 2023-06-19 ENCOUNTER — Inpatient Hospital Stay: Payer: Medicare HMO | Admitting: Internal Medicine

## 2023-06-19 ENCOUNTER — Inpatient Hospital Stay: Payer: Medicare HMO

## 2023-06-19 ENCOUNTER — Inpatient Hospital Stay: Payer: Medicare HMO | Attending: Physician Assistant

## 2023-06-19 VITALS — BP 128/69 | HR 70 | Temp 97.2°F | Resp 19 | Ht 66.0 in | Wt 152.8 lb

## 2023-06-19 DIAGNOSIS — C7951 Secondary malignant neoplasm of bone: Secondary | ICD-10-CM | POA: Diagnosis not present

## 2023-06-19 DIAGNOSIS — C3411 Malignant neoplasm of upper lobe, right bronchus or lung: Secondary | ICD-10-CM | POA: Insufficient documentation

## 2023-06-19 DIAGNOSIS — N189 Chronic kidney disease, unspecified: Secondary | ICD-10-CM | POA: Diagnosis not present

## 2023-06-19 DIAGNOSIS — Z5112 Encounter for antineoplastic immunotherapy: Secondary | ICD-10-CM | POA: Insufficient documentation

## 2023-06-19 DIAGNOSIS — Z79620 Long term (current) use of immunosuppressive biologic: Secondary | ICD-10-CM | POA: Diagnosis not present

## 2023-06-19 LAB — CMP (CANCER CENTER ONLY)
ALT: 15 U/L (ref 0–44)
AST: 23 U/L (ref 15–41)
Albumin: 3.5 g/dL (ref 3.5–5.0)
Alkaline Phosphatase: 81 U/L (ref 38–126)
Anion gap: 5 (ref 5–15)
BUN: 19 mg/dL (ref 8–23)
CO2: 30 mmol/L (ref 22–32)
Calcium: 8.7 mg/dL — ABNORMAL LOW (ref 8.9–10.3)
Chloride: 108 mmol/L (ref 98–111)
Creatinine: 1.43 mg/dL — ABNORMAL HIGH (ref 0.44–1.00)
GFR, Estimated: 38 mL/min — ABNORMAL LOW (ref >60)
Glucose, Bld: 113 mg/dL — ABNORMAL HIGH (ref 70–99)
Potassium: 4.2 mmol/L (ref 3.5–5.1)
Sodium: 143 mmol/L (ref 135–145)
Total Bilirubin: 0.7 mg/dL (ref 0.0–1.2)
Total Protein: 6.4 g/dL — ABNORMAL LOW (ref 6.5–8.1)

## 2023-06-19 LAB — CBC WITH DIFFERENTIAL (CANCER CENTER ONLY)
Abs Immature Granulocytes: 0.02 10*3/uL (ref 0.00–0.07)
Basophils Absolute: 0.1 10*3/uL (ref 0.0–0.1)
Basophils Relative: 1 %
Eosinophils Absolute: 1.5 10*3/uL — ABNORMAL HIGH (ref 0.0–0.5)
Eosinophils Relative: 18 %
HCT: 36 % (ref 36.0–46.0)
Hemoglobin: 11.6 g/dL — ABNORMAL LOW (ref 12.0–15.0)
Immature Granulocytes: 0 %
Lymphocytes Relative: 24 %
Lymphs Abs: 1.9 10*3/uL (ref 0.7–4.0)
MCH: 29.1 pg (ref 26.0–34.0)
MCHC: 32.2 g/dL (ref 30.0–36.0)
MCV: 90.5 fL (ref 80.0–100.0)
Monocytes Absolute: 0.8 10*3/uL (ref 0.1–1.0)
Monocytes Relative: 10 %
Neutro Abs: 3.6 10*3/uL (ref 1.7–7.7)
Neutrophils Relative %: 47 %
Platelet Count: 166 10*3/uL (ref 150–400)
RBC: 3.98 MIL/uL (ref 3.87–5.11)
RDW: 13.2 % (ref 11.5–15.5)
WBC Count: 7.9 10*3/uL (ref 4.0–10.5)
nRBC: 0 % (ref 0.0–0.2)

## 2023-06-19 LAB — TSH: TSH: 0.629 u[IU]/mL (ref 0.350–4.500)

## 2023-06-19 MED ORDER — SODIUM CHLORIDE 0.9 % IV SOLN
200.0000 mg | Freq: Once | INTRAVENOUS | Status: AC
  Administered 2023-06-19: 200 mg via INTRAVENOUS
  Filled 2023-06-19: qty 200

## 2023-06-19 MED ORDER — SODIUM CHLORIDE 0.9 % IV SOLN: Freq: Once | INTRAVENOUS | Status: AC

## 2023-06-19 NOTE — Progress Notes (Signed)
 [facility] [phone]   [phone]  OFFICE PROGRESS NOTE  [name], MD [address] [place] [address]  DIAGNOSIS: Metastatic non-small cell lung cancer, adenocarcinoma that was initially diagnosed as stage IIb (T1b, N1, M0) non-small cell lung cancer, adenocarcinoma presented with right upper lobe lung nodule in June 2021.   Detected Alteration(s) / Biomarker(s) Associated FDA-approved therapies Clinical Trial Availability % cfDNA or Amplification BRAF V600E approved by FDA Dabrafenib+trametinib, Encorafenib+binimetinib approved in other indication Cobimetinib, Dabrafenib, Trametinib, Vemurafenib, Vemurafenib+cobimetinib Yes 8.3%  BRCA1 R252S None (VUS) None (VUS) 0.1%   SMAD4 D45fs None None 7.2%  PD-L1 expression 35%  PRIOR THERAPY:  1) Status post right upper lobectomy with lymph node dissection on September 30, 2019 in [place].  She declines adjuvant systemic chemotherapy. 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 Mitzi Hansen.  CURRENT THERAPY: Systemic chemotherapy with carboplatin for AUC of 5, Alimta 500 Mg/M2 and Keytruda 200 Mg IV every 3 weeks.  First dose April 16, 2022.  Status post 20 cycles.  Starting cycle #5 she will be on maintenance treatment with Keytruda every 3 weeks.  INTERVAL HISTORY: Brandi Holmes 73 y.o. female returns to the clinic today for follow-up visit. Discussed the use of AI scribe software for clinical note transcription with the patient, who gave verbal consent to proceed.  History of Present Illness   Brandi Holmes "Brandi Holmes" is a 74 year old female with a history of cancer who presents for follow-up regarding her treatment and kidney function.  She is currently undergoing treatment with Keytruda, having completed ten cycles and is now on her 21st cycle. Initially,  her treatment regimen included carboplatin, Alimta, and Keytruda, but she is now solely on Keytruda. She experiences tenderness on the right side of her chest, under her arm where surgical incisions were made. This tenderness is managed with tramadol and Lyrica, which she finds effective without causing fatigue, sleepiness, or dizziness.  She is concerned about her kidney function, noting a decrease in her GFR since starting chemotherapy. Her GFR was above 60 before chemotherapy but dropped significantly during treatment, reaching as low as 25 at one point. Recent readings have varied between 38 to 45. She is not on dialysis but is worried about the fluctuation in her kidney function numbers. A healthcare provider named Herbert Seta has discussed her kidney function with her, expressing concern about the low readings.  She mentions experiencing a sensation of thick mucus in her chest that she is unable to expel despite coughing. She has not yet started using Mucinex, which she purchased to help with this issue. She recently underwent an echocardiogram as her cardiologist suspects some symptoms may be heart-related, possibly due to her cancer treatment. No shortness of breath or coughing up anything, although she feels there is thick mucus present.         MEDICAL HISTORY: Past Medical History:  Diagnosis Date   Anemia    as a child only   Coronary artery calcification of native artery    Hyperlipidemia    Hypertension    Liver spots    " per pt"   Lung nodule    Wears glasses    Wears partial dentures     ALLERGIES:  is allergic to crab [shellfish allergy] and morphine and codeine.  MEDICATIONS:  Current Outpatient Medications  Medication  Sig Dispense Refill   aspirin EC 81 MG tablet Take 81 mg by mouth at bedtime. Swallow whole.     atorvastatin (LIPITOR) 20 MG tablet Take 20 mg by mouth at bedtime.     folic acid (FOLVITE) 1 MG tablet TAKE ONE TABLET BY MOUTH EVERY DAY 30 tablet 0    furosemide (LASIX) 20 MG tablet Take 20 mg by mouth daily.     lactose free nutrition (BOOST) LIQD Take 237 mLs by mouth 2 (two) times daily between meals.     latanoprost (XALATAN) 0.005 % ophthalmic solution Place 1 drop into both eyes at bedtime.     lisinopril (ZESTRIL) 10 MG tablet Take 1 tablet (10 mg total) by mouth every morning. 90 tablet 0   magnesium citrate SOLN Take 1 Bottle by mouth once a week.     OVER THE COUNTER MEDICATION "Prune X" -2-3 x week     pregabalin (LYRICA) 150 MG capsule Take 150 mg by mouth 2 (two) times daily.     prochlorperazine (COMPAZINE) 10 MG tablet Take 1 tablet (10 mg total) by mouth every 6 (six) hours as needed for nausea or vomiting. 30 tablet 0   tiZANidine (ZANAFLEX) 2 MG tablet Take 2 mg by mouth at bedtime.     traMADol HCl 100 MG TABS Take 1 tablet by mouth QID. 1 tablet by mouth every 6 hours as needed for pain     No current facility-administered medications for this visit.    SURGICAL HISTORY:  Past Surgical History:  Procedure Laterality Date   ABDOMINAL HYSTERECTOMY  1974   heavy bleeding   ABDOMINAL HYSTERECTOMY     BRONCHIAL BRUSHINGS  08/13/2019   Procedure: BRONCHIAL BRUSHINGS;  Surgeon: Josephine Igo, DO;  Location: [facility];  Service: Pulmonary;;   BRONCHIAL WASHINGS  08/13/2019   Procedure: BRONCHIAL WASHINGS;  Surgeon: Josephine Igo, DO;  Location: [facility];  Service: Pulmonary;;   COLONOSCOPY W/ BIOPSIES AND POLYPECTOMY     DILATION AND CURETTAGE OF UTERUS     FINE NEEDLE ASPIRATION  08/13/2019   Procedure: FINE NEEDLE ASPIRATION (FNA) LINEAR;  Surgeon: Josephine Igo, DO;  Location: [facility];  Service: Pulmonary;;   FOOT SURGERY     bilateral bunions and hammer toes   IR RADIOLOGIST EVAL & MGMT  05/20/2022   LUNG BIOPSY  08/13/2019   Procedure: LUNG BIOPSY;  Surgeon: Josephine Igo, DO;  Location: [facility];  Service: Pulmonary;;   MULTIPLE TOOTH EXTRACTIONS     VIDEO BRONCHOSCOPY WITH ENDOBRONCHIAL  NAVIGATION Right 08/13/2019   Procedure: VIDEO BRONCHOSCOPY WITH ENDOBRONCHIAL NAVIGATION;  Surgeon: Josephine Igo, DO;  Location: [facility];  Service: Pulmonary;  Laterality: Right;   VIDEO BRONCHOSCOPY WITH ENDOBRONCHIAL ULTRASOUND Right 08/13/2019   Procedure: VIDEO BRONCHOSCOPY WITH ENDOBRONCHIAL ULTRASOUND;  Surgeon: Josephine Igo, DO;  Location: [facility];  Service: Pulmonary;  Laterality: Right;    REVIEW OF SYSTEMS:  Constitutional: positive for fatigue Eyes: negative Ears, nose, mouth, throat, and face: negative Respiratory: positive for pleurisy/chest pain Cardiovascular: negative Gastrointestinal: negative Genitourinary:negative Integument/breast: negative Hematologic/lymphatic: negative Musculoskeletal:positive for arthralgias Neurological: negative Behavioral/Psych: negative Endocrine: negative Allergic/Immunologic: negative   PHYSICAL EXAMINATION: General appearance: alert, cooperative, fatigued, and no distress Head: Normocephalic, without obvious abnormality, atraumatic Neck: no adenopathy, no JVD, supple, symmetrical, trachea midline, and thyroid not enlarged, symmetric, no tenderness/mass/nodules Lymph nodes: Cervical, supraclavicular, and axillary nodes normal. Resp: clear to auscultation bilaterally Back: symmetric, no curvature. ROM normal. No CVA tenderness. Cardio: regular  rate and rhythm, S1, S2 normal, no murmur, click, rub or gallop GI: soft, non-tender; bowel sounds normal; no masses,  no organomegaly Extremities: extremities normal, atraumatic, no cyanosis or edema Neurologic: Alert and oriented X 3, normal strength and tone. Normal symmetric reflexes. Normal coordination and gait  ECOG PERFORMANCE STATUS: 1 - Symptomatic but completely ambulatory  Blood pressure 128/69, pulse 70, temperature (!) 97.2 F (36.2 C), temperature source Temporal, resp. rate 19, height 5\' 6"  (1.676 m), weight 152 lb 12.8 oz (69.3 kg), SpO2 100%.    LABORATORY  DATA: Lab Results  Component Value Date   WBC 7.9 06/19/2023   HGB 11.6 (L) 06/19/2023   HCT 36.0 06/19/2023   MCV 90.5 06/19/2023   PLT 166 06/19/2023      Chemistry      Component Value Date/Time   NA 138 05/22/2023 1418   NA 142 07/22/2019 1522   K 4.5 05/22/2023 1418   CL 106 05/22/2023 1418   CO2 29 05/22/2023 1418   BUN 25 (H) 05/22/2023 1418   BUN 13 07/22/2019 1522   CREATININE 1.43 (H) 05/22/2023 1418   CREATININE 0.91 09/16/2011 1000      Component Value Date/Time   CALCIUM 9.1 05/22/2023 1418   ALKPHOS 80 05/22/2023 1418   AST 22 05/22/2023 1418   ALT 17 05/22/2023 1418   BILITOT 0.6 05/22/2023 1418       RADIOGRAPHIC STUDIES: ECHOCARDIOGRAM LIMITED Result Date: 06/17/2023    ECHOCARDIOGRAM LIMITED REPORT   Patient Name:   [facility] RITA Holmes Date of Exam: 06/17/2023 Medical Rec #:  [id number]              Height:       66.0 in Accession #:    [id number]             Weight:       156.2 lb Date of Birth:  [birth year]              BSA:          1.800 m Patient Age:    73 years               BP:           112/67 mmHg Patient Gender: F                      HR:           71 bpm. Exam Location:  [address] Procedure: 2D Echo, Limited Echo, Limited Color Doppler and Cardiac Doppler            (Both Spectral and Color Flow Doppler were utilized during            procedure). Indications:    I31.3 Pericardial Effusion  History:        Patient has prior history of Echocardiogram examinations, most                 recent 11/05/2022. Risk Factors:Hypertension, Dyslipidemia and                 Former Smoker. Lung Cancer.  Sonographer:    Daphine Deutscher RDCS Referring Phys: [id number] SUNIT TOLIA IMPRESSIONS  1. Compared to 11/05/22 pericardial effusion is smaller.  2. Left ventricular ejection fraction, by estimation, is 60 to 65%. The left ventricle has normal function. The left ventricle has no regional wall motion abnormalities. The average left ventricular global longitudinal  strain is -24.4 %. The global  longitudinal strain is normal.  3. Right ventricular systolic function is normal. The right ventricular size is normal.  4. A small pericardial effusion is present.  5. The mitral valve is normal in structure. Trivial mitral valve regurgitation. No evidence of mitral stenosis.  6. The aortic valve is tricuspid. Aortic valve regurgitation is trivial. Aortic valve sclerosis/calcification is present, without any evidence of aortic stenosis.  7. The inferior vena cava is normal in size with greater than 50% respiratory variability, suggesting right atrial pressure of 3 mmHg. FINDINGS  Left Ventricle: Left ventricular ejection fraction, by estimation, is 60 to 65%. The left ventricle has normal function. The left ventricle has no regional wall motion abnormalities. The average left ventricular global longitudinal strain is -24.4 %. Strain was performed and the global longitudinal strain is normal. The left ventricular internal cavity size was normal in size. There is no left ventricular hypertrophy. Right Ventricle: The right ventricular size is normal. Right ventricular systolic function is normal. Left Atrium: Left atrial size was normal in size. Right Atrium: Right atrial size was normal in size. Pericardium: A small pericardial effusion is present. Mitral Valve: The mitral valve is normal in structure. Trivial mitral valve regurgitation. No evidence of mitral valve stenosis. Tricuspid Valve: The tricuspid valve is normal in structure. Tricuspid valve regurgitation is mild . No evidence of tricuspid stenosis. Aortic Valve: The aortic valve is tricuspid. Aortic valve regurgitation is trivial. Aortic valve sclerosis/calcification is present, without any evidence of aortic stenosis. Pulmonic Valve: The pulmonic valve was normal in structure. Pulmonic valve regurgitation is not visualized. No evidence of pulmonic stenosis. Aorta: The aortic root is normal in size and structure. Venous: The  inferior vena cava is normal in size with greater than 50% respiratory variability, suggesting right atrial pressure of 3 mmHg. Additional Comments: Compared to 11/05/22 pericardial effusion is smaller. 3D was performed not requiring image post processing on an independent workstation and was normal.  LEFT VENTRICLE PLAX 2D LVIDd:         4.30 cm LVIDs:         2.70 cm 2D Longitudinal Strain LV PW:         1.00 cm 2D Strain GLS (A4C):   -24.7 % LV IVS:        0.60 cm 2D Strain GLS (A3C):   -24.5 %                        2D Strain GLS (A2C):   -24.2 %                        2D Strain GLS Avg:     -24.4 %                         3D Volume EF:                        3D EF:        58 %                        LV EDV:       121 ml                        LV ESV:       51 ml  LV SV:        70 ml LEFT ATRIUM         Index LA diam:    4.10 cm 2.28 cm/m   AORTA Ao Root diam: 3.40 cm MITRAL VALVE                TRICUSPID VALVE MV Area (PHT): 2.95 cm     TR Peak grad:   38.7 mmHg MV Decel Time: 257 msec     TR Vmax:        311.00 cm/s MV E velocity: 115.00 cm/s MV A velocity: 134.00 cm/s MV E/A ratio:  0.86 Olga Millers MD Electronically signed by Olga Millers MD Signature Date/Time: 06/17/2023/4:57:21 PM    Final      ASSESSMENT AND PLAN: This is a pleasant 73 years old African-American female diagnosed with metastatic also lung cancer, adenocarcinoma initially diagnosed as stage IIb (T1b, N1, M0) non-small cell lung cancer, adenocarcinoma status post a right upper lobectomy with lymph node dissection on September 30, 2019 in [place].  She declined adjuvant systemic chemotherapy at that time. She was found to have evidence for disease metastasis in November 2023 presenting with multiple metastatic bone lesions. The patient had molecular studies that showed an actionable mutation with BRAF V600E mutation and PD-L1 expression of 35%. She underwent palliative radiotherapy to the T9 vertebral lesion under  the care of Dr. Mitzi Hansen. The patient is currently undergoing first-line combination of systemic chemotherapy with carboplatin for AUC of 5, Alimta 500 Mg/M2 and Keytruda 200 Mg IV every 3 weeks.  Status post 20 cycles.  Starting from cycle #5 she will be on maintenance treatment with Alimta and Keytruda every 3 weeks.  Starting from cycle #5 she will be on single agent Keytruda only.  Alimta will be discontinued secondary to renal insufficiency. The patient has been tolerating this treatment fairly well with no concerning adverse effects except for mild fatigue.     Non-small cell lung cancer Undergoing treatment since January 2023. Initially received chemotherapy with carboplatin, Alimta, and Keytruda. Currently on Keytruda monotherapy, having completed 21 cycles. Reports tenderness on the right side of the chest, likely related to previous surgical incisions. No significant pain, functioning well with current pain management regimen. Tolerating Tramadol and Lyrica without adverse effects. No dyspnea or productive cough, reports thick mucus difficult to expectorate. Side effects from chemotherapy should have resolved. Cardiologist suspects some symptoms may be heart-related. - Continue Keytruda immunotherapy - Continue Tramadol and Lyrica for pain management - Initiate Mucinex for mucus expectoration - Perform a physical exam to assess current status  Potential cardiac involvement Echocardiogram performed to evaluate potential cardiac involvement. Cardiologist suspects some symptoms may be heart-related. Keytruda can affect the heart, though rarely. Echocardiogram results pending. - Review echocardiogram results with cardiologist  Chronic kidney disease Kidney function affected by previous chemotherapy, with GFR decreasing from above 60 to 37-45. Decline likely due to carboplatin and Alimta, which have been discontinued. Creatinine levels show variability, recent measurement at 1.43. Not at the point  of requiring dialysis, kidney function well-managed at present. - Monitor kidney function with regular blood tests - Reassure regarding current kidney function status  Follow-up Scheduled for regular follow-up to monitor treatment progress and manage any side effects. - Schedule follow-up appointment in three weeks   She was advised to call immediately if she has any concerning symptoms in the interval. The patient voices understanding of current disease status and treatment options and is in agreement with the current  care plan. The total time spent in the appointment was 30 minutes.  All questions were answered. The patient knows to call the clinic with any problems, questions or concerns. We can certainly see the patient much sooner if necessary.  Disclaimer: This note was dictated with voice recognition software. Similar sounding words can inadvertently be transcribed and may not be corrected upon review.

## 2023-06-19 NOTE — Patient Instructions (Signed)

## 2023-06-20 LAB — T4: T4, Total: 5.5 ug/dL (ref 4.5–12.0)

## 2023-07-03 ENCOUNTER — Ambulatory Visit: Payer: Medicare HMO | Admitting: Physician Assistant

## 2023-07-03 ENCOUNTER — Ambulatory Visit: Payer: Medicare HMO

## 2023-07-03 ENCOUNTER — Other Ambulatory Visit: Payer: Medicare HMO

## 2023-07-07 NOTE — Progress Notes (Unsigned)
 [facility] OFFICE PROGRESS NOTE  Brandi Able, MD [address] [place] [address]  DIAGNOSIS: Metastatic non-small cell lung cancer, adenocarcinoma that was initially diagnosed as stage IIb (T1b, N1, M0) non-small cell lung cancer, adenocarcinoma presented with right upper lobe lung nodule   Detected Alteration(s) / Biomarker(s) Associated FDA-approved therapiesClinical Trial Availability% cfDNA or Amplification BRAF V600E approved by FDA Dabrafenib+trametinib, Encorafenib+binimetinib approved in other indication Cobimetinib, Dabrafenib, Trametinib, Vemurafenib, Vemurafenib+cobimetinib Yes8.3%   BRCA1 R252S None (VUS) None (VUS) 0.1%     SMAD4 D414fs None None 7.2%   PD-L1 expression 35%  PRIOR THERAPY: 1) Status post right upper lobectomy with lymph node dissection on September 30, 2019 in [place].  She declines adjuvant systemic chemotherapy. 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 Mitzi Hansen.  CURRENT THERAPY: Systemic chemotherapy with carboplatin for AUC of 5, Alimta 500 Mg/M2 and Keytruda 200 Mg IV every 3 weeks. First dose April 16, 2022. Status post 21 cycles. Starting cycle #5 she will be on maintenance treatment with Keytruda every 3 weeks.   INTERVAL HISTORY: Brandi Holmes 73 y.o. female returns to the clinic for a follow-up visit. The patient is feeling fairly today without any concerning complaints. She was last seen by Dr. Arbutus Ped 3 weeks ago. ***At her last visit was endorsing tenderness on right chest from prior surgery. She also had pericardial effusion for which she had repeat echocardiogram. She is on tramadol and lyrica which is not effective but makes her tired. She is currently undergoing maintenance treatment with immunotherapy with Keytruda.  She is tolerating this well.  Today, she denies any fever, chills, night  sweats, or unexplained weight loss. She states she has been gaining a pound every 3 weeks.  Denies any changes with her breathing since last being seen.  She typically reports she may have cough due to phelgm. Denies any nausea, vomiting, or diarrhea.  She struggles with chronic constipation but states this is under control. Denies any headaches or visual changes.  He does have some dry skin and cracking near her fingertips.  Denies any rashes or skin changes otherwise. She is here for evaluation and repeat blood work before undergoing cycle #22.   MEDICAL HISTORY: Past Medical History:  Diagnosis Date   Anemia    as a child only   Coronary artery calcification of native artery    Hyperlipidemia    Hypertension    Liver spots    " per pt"   Lung nodule    Wears glasses    Wears partial dentures     ALLERGIES:  is allergic to crab [shellfish allergy] and morphine and codeine.  MEDICATIONS:  Current Outpatient Medications  Medication Sig Dispense Refill   aspirin EC 81 MG tablet Take 81 mg by mouth at bedtime. Swallow whole.     atorvastatin (LIPITOR) 20 MG tablet Take 20 mg by mouth at bedtime.     folic acid (FOLVITE) 1 MG tablet TAKE ONE TABLET BY MOUTH EVERY DAY 30 tablet 0   furosemide (LASIX) 20 MG tablet Take 20 mg by mouth daily.     lactose free nutrition (BOOST) LIQD Take 237 mLs by mouth 2 (two) times daily between meals.     latanoprost (XALATAN) 0.005 % ophthalmic solution Place 1 drop into both eyes at bedtime.     lisinopril (ZESTRIL) 10 MG tablet Take 1 tablet (  10 mg total) by mouth every morning. 90 tablet 0   magnesium citrate SOLN Take 1 Bottle by mouth once a week.     OVER THE COUNTER MEDICATION "Prune X" -2-3 x week     pregabalin (LYRICA) 150 MG capsule Take 150 mg by mouth 2 (two) times daily.     prochlorperazine (COMPAZINE) 10 MG tablet Take 1 tablet (10 mg total) by mouth every 6 (six) hours as needed for nausea or vomiting. 30 tablet 0   tiZANidine  (ZANAFLEX) 2 MG tablet Take 2 mg by mouth at bedtime.     traMADol HCl 100 MG TABS Take 1 tablet by mouth QID. 1 tablet by mouth every 6 hours as needed for pain     No current facility-administered medications for this visit.    SURGICAL HISTORY:  Past Surgical History:  Procedure Laterality Date   ABDOMINAL HYSTERECTOMY  1974   heavy bleeding   ABDOMINAL HYSTERECTOMY     BRONCHIAL BRUSHINGS  08/13/2019   Procedure: BRONCHIAL BRUSHINGS;  Surgeon: Josephine Igo, DO;  Location: [facility];  Service: Pulmonary;;   BRONCHIAL WASHINGS  08/13/2019   Procedure: BRONCHIAL WASHINGS;  Surgeon: Josephine Igo, DO;  Location: [facility];  Service: Pulmonary;;   COLONOSCOPY W/ BIOPSIES AND POLYPECTOMY     DILATION AND CURETTAGE OF UTERUS     FINE NEEDLE ASPIRATION  08/13/2019   Procedure: FINE NEEDLE ASPIRATION (FNA) LINEAR;  Surgeon: Josephine Igo, DO;  Location: [facility];  Service: Pulmonary;;   FOOT SURGERY     bilateral bunions and hammer toes   IR RADIOLOGIST EVAL & MGMT  05/20/2022   LUNG BIOPSY  08/13/2019   Procedure: LUNG BIOPSY;  Surgeon: Josephine Igo, DO;  Location: [facility];  Service: Pulmonary;;   MULTIPLE TOOTH EXTRACTIONS     VIDEO BRONCHOSCOPY WITH ENDOBRONCHIAL NAVIGATION Right 08/13/2019   Procedure: VIDEO BRONCHOSCOPY WITH ENDOBRONCHIAL NAVIGATION;  Surgeon: Josephine Igo, DO;  Location: [facility];  Service: Pulmonary;  Laterality: Right;   VIDEO BRONCHOSCOPY WITH ENDOBRONCHIAL ULTRASOUND Right 08/13/2019   Procedure: VIDEO BRONCHOSCOPY WITH ENDOBRONCHIAL ULTRASOUND;  Surgeon: Josephine Igo, DO;  Location: [facility];  Service: Pulmonary;  Laterality: Right;    REVIEW OF SYSTEMS:   Review of Systems  Constitutional: Negative for appetite change, chills, fatigue, fever and unexpected weight change.  HENT:   Negative for mouth sores, nosebleeds, sore throat and trouble swallowing.   Eyes: Negative for eye problems and icterus.  Respiratory:  Negative for cough, hemoptysis, shortness of breath and wheezing.   Cardiovascular: Negative for chest pain and leg swelling.  Gastrointestinal: Negative for abdominal pain, constipation, diarrhea, nausea and vomiting.  Genitourinary: Negative for bladder incontinence, difficulty urinating, dysuria, frequency and hematuria.   Musculoskeletal: Negative for back pain, gait problem, neck pain and neck stiffness.  Skin: Negative for itching and rash.  Neurological: Negative for dizziness, extremity weakness, gait problem, headaches, light-headedness and seizures.  Hematological: Negative for adenopathy. Does not bruise/bleed easily.  Psychiatric/Behavioral: Negative for confusion, depression and sleep disturbance. The patient is not nervous/anxious.     PHYSICAL EXAMINATION:  There were no vitals taken for this visit.  ECOG PERFORMANCE STATUS: {CHL ONC ECOG [id number]  Physical Exam  Constitutional: Oriented to person, place, and time and well-developed, well-nourished, and in no distress. No distress.  HENT:  Head: Normocephalic and atraumatic.  Mouth/Throat: Oropharynx is clear and moist. No oropharyngeal exudate.  Eyes: Conjunctivae are normal. Right eye exhibits no discharge. Left eye exhibits  no discharge. No scleral icterus.  Neck: Normal range of motion. Neck supple.  Cardiovascular: Normal rate, regular rhythm, normal heart sounds and intact distal pulses.   Pulmonary/Chest: Effort normal and breath sounds normal. No respiratory distress. No wheezes. No rales.  Abdominal: Soft. Bowel sounds are normal. Exhibits no distension and no mass. There is no tenderness.  Musculoskeletal: Normal range of motion. Exhibits no edema.  Lymphadenopathy:    No cervical adenopathy.  Neurological: Alert and oriented to person, place, and time. Exhibits normal muscle tone. Gait normal. Coordination normal.  Skin: Skin is warm and dry. No rash noted. Not diaphoretic. No erythema. No pallor.   Psychiatric: Mood, memory and judgment normal.  Vitals reviewed.  LABORATORY DATA: Lab Results  Component Value Date   WBC 7.9 06/19/2023   HGB 11.6 (L) 06/19/2023   HCT 36.0 06/19/2023   MCV 90.5 06/19/2023   PLT 166 06/19/2023      Chemistry      Component Value Date/Time   NA 143 06/19/2023 1428   NA 142 07/22/2019 1522   K 4.2 06/19/2023 1428   CL 108 06/19/2023 1428   CO2 30 06/19/2023 1428   BUN 19 06/19/2023 1428   BUN 13 07/22/2019 1522   CREATININE 1.43 (H) 06/19/2023 1428   CREATININE 0.91 09/16/2011 1000      Component Value Date/Time   CALCIUM 8.7 (L) 06/19/2023 1428   ALKPHOS 81 06/19/2023 1428   AST 23 06/19/2023 1428   ALT 15 06/19/2023 1428   BILITOT 0.7 06/19/2023 1428       RADIOGRAPHIC STUDIES:  ECHOCARDIOGRAM LIMITED Result Date: 06/17/2023    ECHOCARDIOGRAM LIMITED REPORT   Patient Name:   [facility] RITA Holmes Date of Exam: 06/17/2023 Medical Rec #:  [id number]              Height:       66.0 in Accession #:    [id number]             Weight:       156.2 lb Date of Birth:  11-Dec-1950              BSA:          1.800 m Patient Age:    73 years               BP:           112/67 mmHg Patient Gender: F                      HR:           71 bpm. Exam Location:  [address] Procedure: 2D Echo, Limited Echo, Limited Color Doppler and Cardiac Doppler            (Both Spectral and Color Flow Doppler were utilized during            procedure). Indications:    I31.3 Pericardial Effusion  History:        Patient has prior history of Echocardiogram examinations, most                 recent 11/05/2022. Risk Factors:Hypertension, Dyslipidemia and                 Former Smoker. Lung Cancer.  Sonographer:    Daphine Deutscher RDCS Referring Phys: [id number] SUNIT TOLIA IMPRESSIONS  1. Compared to 11/05/22 pericardial effusion is smaller.  2. Left ventricular ejection fraction, by estimation, is 60 to  65%. The left ventricle has normal function. The left ventricle has no  regional wall motion abnormalities. The average left ventricular global longitudinal strain is -24.4 %. The global longitudinal strain is normal.  3. Right ventricular systolic function is normal. The right ventricular size is normal.  4. A small pericardial effusion is present.  5. The mitral valve is normal in structure. Trivial mitral valve regurgitation. No evidence of mitral stenosis.  6. The aortic valve is tricuspid. Aortic valve regurgitation is trivial. Aortic valve sclerosis/calcification is present, without any evidence of aortic stenosis.  7. The inferior vena cava is normal in size with greater than 50% respiratory variability, suggesting right atrial pressure of 3 mmHg. FINDINGS  Left Ventricle: Left ventricular ejection fraction, by estimation, is 60 to 65%. The left ventricle has normal function. The left ventricle has no regional wall motion abnormalities. The average left ventricular global longitudinal strain is -24.4 %. Strain was performed and the global longitudinal strain is normal. The left ventricular internal cavity size was normal in size. There is no left ventricular hypertrophy. Right Ventricle: The right ventricular size is normal. Right ventricular systolic function is normal. Left Atrium: Left atrial size was normal in size. Right Atrium: Right atrial size was normal in size. Pericardium: A small pericardial effusion is present. Mitral Valve: The mitral valve is normal in structure. Trivial mitral valve regurgitation. No evidence of mitral valve stenosis. Tricuspid Valve: The tricuspid valve is normal in structure. Tricuspid valve regurgitation is mild . No evidence of tricuspid stenosis. Aortic Valve: The aortic valve is tricuspid. Aortic valve regurgitation is trivial. Aortic valve sclerosis/calcification is present, without any evidence of aortic stenosis. Pulmonic Valve: The pulmonic valve was normal in structure. Pulmonic valve regurgitation is not visualized. No evidence of  pulmonic stenosis. Aorta: The aortic root is normal in size and structure. Venous: The inferior vena cava is normal in size with greater than 50% respiratory variability, suggesting right atrial pressure of 3 mmHg. Additional Comments: Compared to 11/05/22 pericardial effusion is smaller. 3D was performed not requiring image post processing on an independent workstation and was normal.  LEFT VENTRICLE PLAX 2D LVIDd:         4.30 cm LVIDs:         2.70 cm 2D Longitudinal Strain LV PW:         1.00 cm 2D Strain GLS (A4C):   -24.7 % LV IVS:        0.60 cm 2D Strain GLS (A3C):   -24.5 %                        2D Strain GLS (A2C):   -24.2 %                        2D Strain GLS Avg:     -24.4 %                         3D Volume EF:                        3D EF:        58 %                        LV EDV:       121 ml  LV ESV:       51 ml                        LV SV:        70 ml LEFT ATRIUM         Index LA diam:    4.10 cm 2.28 cm/m   AORTA Ao Root diam: 3.40 cm MITRAL VALVE                TRICUSPID VALVE MV Area (PHT): 2.95 cm     TR Peak grad:   38.7 mmHg MV Decel Time: 257 msec     TR Vmax:        311.00 cm/s MV E velocity: 115.00 cm/s MV A velocity: 134.00 cm/s MV E/A ratio:  0.86 Olga Millers MD Electronically signed by Olga Millers MD Signature Date/Time: 06/17/2023/4:57:21 PM    Final      ASSESSMENT/PLAN:  This is a very pleasant 73 year old African-American female diagnosed with metastatic non-small cell lung cancer, adenocarcinoma.  She was initially diagnosed as a stage IIb (T1b, N1, M0).  She is status post right upper lobectomy with lymph node dissection on September 30, 2019 in [place].  She declined adjuvant systemic chemotherapy at that time.   She is found to have metastatic disease to the bone in November 2023.   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   She underwent palliative radiation to T9 under the care of Dr.  Mitzi Hansen.   She is currently on systemic therapy.  She was started on systemic chemotherapy with carboplatin for an AUC of 5, Alimta 500 mg/m, and Keytruda 200 mg IV every 3 weeks.  She is status post 21 cycles.  Starting from cycle #5, she started maintenance treatment with immunotherapy with Keytruda only.  Alimta was discontinued secondary to renal insufficiency.    Labs were reviewed. Recommend that she proceed with cycle #22 today as schedule.    We will see her back for follow-up visit in 3 weeks for evaluation repeat blood work before undergoing cycle #23   I will arrange for a restaging CT scan of the CAP prior to her next cycle of treatment.  I will order her scan without contrast due to her CKD.   She will continue to follow with her PCP and cardiologist regarding her intermittent mild lower extremity swelling and pericardial effusion which they are monitoring.   For the mild dry skin and cracking on her fingertips, she is advised to use lotion and moisturizers.   She was advised to hydrate well at home.  The patient was advised to call immediately if she has any concerning symptoms in the interval. The patient voices understanding of current disease status and treatment options and is in agreement with the current care plan. All questions were answered. The patient knows to call the clinic with any problems, questions or concerns. We can certainly see the patient much sooner if necessary   No orders of the defined types were placed in this encounter.    I spent {CHL ONC TIME VISIT - ZOXWR:[id number]} counseling the patient face to face. The total time spent in the appointment was {CHL ONC TIME VISIT - BJYNW:[id number]}.  Arrie Zuercher L Alexica Schlossberg, PA-C 07/07/23

## 2023-07-10 ENCOUNTER — Inpatient Hospital Stay: Payer: Medicare HMO

## 2023-07-10 ENCOUNTER — Inpatient Hospital Stay (HOSPITAL_BASED_OUTPATIENT_CLINIC_OR_DEPARTMENT_OTHER): Payer: Medicare HMO | Admitting: Physician Assistant

## 2023-07-10 VITALS — BP 157/74 | HR 62 | Temp 98.3°F | Resp 13 | Wt 156.8 lb

## 2023-07-10 DIAGNOSIS — C7951 Secondary malignant neoplasm of bone: Secondary | ICD-10-CM

## 2023-07-10 DIAGNOSIS — Z5112 Encounter for antineoplastic immunotherapy: Secondary | ICD-10-CM

## 2023-07-10 DIAGNOSIS — C3411 Malignant neoplasm of upper lobe, right bronchus or lung: Secondary | ICD-10-CM

## 2023-07-10 LAB — CMP (CANCER CENTER ONLY)
ALT: 16 U/L (ref 0–44)
AST: 27 U/L (ref 15–41)
Albumin: 3.9 g/dL (ref 3.5–5.0)
Alkaline Phosphatase: 91 U/L (ref 38–126)
Anion gap: 5 (ref 5–15)
BUN: 25 mg/dL — ABNORMAL HIGH (ref 8–23)
CO2: 29 mmol/L (ref 22–32)
Calcium: 9.2 mg/dL (ref 8.9–10.3)
Chloride: 107 mmol/L (ref 98–111)
Creatinine: 1.38 mg/dL — ABNORMAL HIGH (ref 0.44–1.00)
GFR, Estimated: 40 mL/min — ABNORMAL LOW (ref >60)
Glucose, Bld: 68 mg/dL — ABNORMAL LOW (ref 70–99)
Potassium: 4.2 mmol/L (ref 3.5–5.1)
Sodium: 141 mmol/L (ref 135–145)
Total Bilirubin: 0.6 mg/dL (ref 0.0–1.2)
Total Protein: 7.1 g/dL (ref 6.5–8.1)

## 2023-07-10 LAB — CBC WITH DIFFERENTIAL (CANCER CENTER ONLY)
Abs Immature Granulocytes: 0.02 10*3/uL (ref 0.00–0.07)
Basophils Absolute: 0.1 10*3/uL (ref 0.0–0.1)
Basophils Relative: 1 %
Eosinophils Absolute: 1.2 10*3/uL — ABNORMAL HIGH (ref 0.0–0.5)
Eosinophils Relative: 15 %
HCT: 42.1 % (ref 36.0–46.0)
Hemoglobin: 13.1 g/dL (ref 12.0–15.0)
Immature Granulocytes: 0 %
Lymphocytes Relative: 29 %
Lymphs Abs: 2.2 10*3/uL (ref 0.7–4.0)
MCH: 29.2 pg (ref 26.0–34.0)
MCHC: 31.1 g/dL (ref 30.0–36.0)
MCV: 94 fL (ref 80.0–100.0)
Monocytes Absolute: 0.7 10*3/uL (ref 0.1–1.0)
Monocytes Relative: 9 %
Neutro Abs: 3.5 10*3/uL (ref 1.7–7.7)
Neutrophils Relative %: 46 %
Platelet Count: 182 10*3/uL (ref 150–400)
RBC: 4.48 MIL/uL (ref 3.87–5.11)
RDW: 13 % (ref 11.5–15.5)
WBC Count: 7.7 10*3/uL (ref 4.0–10.5)
nRBC: 0 % (ref 0.0–0.2)

## 2023-07-10 MED ORDER — SODIUM CHLORIDE 0.9 % IV SOLN
200.0000 mg | Freq: Once | INTRAVENOUS | Status: AC
  Administered 2023-07-10: 200 mg via INTRAVENOUS
  Filled 2023-07-10: qty 200

## 2023-07-10 MED ORDER — SODIUM CHLORIDE 0.9 % IV SOLN: Freq: Once | INTRAVENOUS | Status: AC

## 2023-07-10 NOTE — Patient Instructions (Signed)

## 2023-07-24 ENCOUNTER — Ambulatory Visit (HOSPITAL_COMMUNITY)
Admission: RE | Admit: 2023-07-24 | Discharge: 2023-07-24 | Disposition: A | Discharge status: Home / Self Care | Source: Ambulatory Visit | Attending: Physician Assistant | Admitting: Physician Assistant

## 2023-07-24 ENCOUNTER — Ambulatory Visit: Payer: Medicare HMO

## 2023-07-24 ENCOUNTER — Other Ambulatory Visit: Payer: Medicare HMO

## 2023-07-24 ENCOUNTER — Ambulatory Visit: Payer: Medicare HMO | Admitting: Internal Medicine

## 2023-07-24 DIAGNOSIS — C7951 Secondary malignant neoplasm of bone: Secondary | ICD-10-CM | POA: Insufficient documentation

## 2023-07-25 NOTE — Progress Notes (Signed)
 [facility] OFFICE PROGRESS NOTE  [name], MD [address] [place] [address]  DIAGNOSIS: Metastatic non-small cell lung cancer, adenocarcinoma that was initially diagnosed as stage IIb (T1b, N1, M0) non-small cell lung cancer, adenocarcinoma presented with right upper lobe lung nodule   Detected Alteration(s) / Biomarker(s) Associated FDA-approved therapiesClinical Trial Availability% cfDNA or Amplification BRAF V600E approved by FDA Dabrafenib+trametinib, Encorafenib+binimetinib approved in other indication Cobimetinib, Dabrafenib, Trametinib, Vemurafenib, Vemurafenib+cobimetinib Yes8.3%   BRCA1 R252S None (VUS) None (VUS) 0.1%     SMAD4 D464fs None None 7.2%   PD-L1 expression 35%  PRIOR THERAPY: 1) Status post right upper lobectomy with lymph node dissection on September 30, 2019 in [place] .  She declines adjuvant systemic chemotherapy. 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 Jeryl Moris.  CURRENT THERAPY: Systemic chemotherapy with carboplatin for AUC of 5, Alimta 500 Mg/M2 and Keytruda 200 Mg IV every 3 weeks. First dose April 16, 2022. Status post 22 cycles. Starting cycle #5 she will be on maintenance treatment with Keytruda every 3 weeks.   INTERVAL HISTORY: Brandi Holmes 73 y.o. female returns to the clinic for a follow-up visit. The patient is feeling fairly today without any concerning complaints except weight gain. She tells me she got a new stove and his baking a lot recently. Her last thyroid studies were normal. She was last seen by myself 3 weeks ago.  She is currently undergoing maintenance treatment with immunotherapy with Keytruda.  She is tolerating this well. Alimta ws discontinued due to CKD. She was wondering what she can do to help her kidney function. She does try to hydrate well. Today, she denies any fever, night  sweats, or unexplained weight loss. Denies any changes with her breathing since last being seen. She still has some rib pain from her prior surgery site from 4 years ago. She is prescribed lyrica and tramadol.  She typically reports she may have cough due to phlegm getting stuck in her throat. She tries to drink warm water and tea. Denies any nausea, vomiting, or diarrhea.  She struggles with chronic constipation which is unchanged. Denies any headaches or visual changes.  She tells me her vision has improved. Denies any rashes or skin changes. She tells me she got a walker with a seat recently. She recently had a restaging CT scan performed.  She is here for evaluation and repeat blood work before undergoing cycle #23.     MEDICAL HISTORY: Past Medical History:  Diagnosis Date   Anemia    as a child only   Coronary artery calcification of native artery    Hyperlipidemia    Hypertension    Liver spots    " per pt"   Lung nodule    Wears glasses    Wears partial dentures     ALLERGIES:  is allergic to crab [shellfish allergy] and morphine and codeine.  MEDICATIONS:  Current Outpatient Medications  Medication Sig Dispense Refill   aspirin EC 81 MG tablet Take 81 mg by mouth at bedtime. Swallow whole.     atorvastatin (LIPITOR) 20 MG tablet Take 20 mg by mouth at bedtime.     folic acid (FOLVITE) 1 MG tablet TAKE ONE TABLET BY MOUTH EVERY DAY 30 tablet 0   furosemide (LASIX) 20 MG tablet Take 20 mg by mouth daily.     lactose free nutrition (BOOST)  LIQD Take 237 mLs by mouth 2 (two) times daily between meals.     latanoprost (XALATAN) 0.005 % ophthalmic solution Place 1 drop into both eyes at bedtime.     lisinopril (ZESTRIL) 10 MG tablet Take 1 tablet (10 mg total) by mouth every morning. 90 tablet 0   magnesium citrate SOLN Take 1 Bottle by mouth once a week.     OVER THE COUNTER MEDICATION "Prune X" -2-3 x week     pregabalin (LYRICA) 150 MG capsule Take 150 mg by mouth 2 (two) times  daily.     prochlorperazine (COMPAZINE) 10 MG tablet Take 1 tablet (10 mg total) by mouth every 6 (six) hours as needed for nausea or vomiting. 30 tablet 0   tiZANidine (ZANAFLEX) 2 MG tablet Take 2 mg by mouth at bedtime.     traMADol HCl 100 MG TABS Take 1 tablet by mouth QID. 1 tablet by mouth every 6 hours as needed for pain     No current facility-administered medications for this visit.    SURGICAL HISTORY:  Past Surgical History:  Procedure Laterality Date   ABDOMINAL HYSTERECTOMY  1974   heavy bleeding   ABDOMINAL HYSTERECTOMY     BRONCHIAL BRUSHINGS  08/13/2019   Procedure: BRONCHIAL BRUSHINGS;  Surgeon: Prudy Brownie, DO;  Location: [facility];  Service: Pulmonary;;   BRONCHIAL WASHINGS  08/13/2019   Procedure: BRONCHIAL WASHINGS;  Surgeon: Prudy Brownie, DO;  Location: [facility];  Service: Pulmonary;;   COLONOSCOPY W/ BIOPSIES AND POLYPECTOMY     DILATION AND CURETTAGE OF UTERUS     FINE NEEDLE ASPIRATION  08/13/2019   Procedure: FINE NEEDLE ASPIRATION (FNA) LINEAR;  Surgeon: Prudy Brownie, DO;  Location: [facility];  Service: Pulmonary;;   FOOT SURGERY     bilateral bunions and hammer toes   IR RADIOLOGIST EVAL & MGMT  05/20/2022   LUNG BIOPSY  08/13/2019   Procedure: LUNG BIOPSY;  Surgeon: Prudy Brownie, DO;  Location: [facility];  Service: Pulmonary;;   MULTIPLE TOOTH EXTRACTIONS     VIDEO BRONCHOSCOPY WITH ENDOBRONCHIAL NAVIGATION Right 08/13/2019   Procedure: VIDEO BRONCHOSCOPY WITH ENDOBRONCHIAL NAVIGATION;  Surgeon: Prudy Brownie, DO;  Location: [facility];  Service: Pulmonary;  Laterality: Right;   VIDEO BRONCHOSCOPY WITH ENDOBRONCHIAL ULTRASOUND Right 08/13/2019   Procedure: VIDEO BRONCHOSCOPY WITH ENDOBRONCHIAL ULTRASOUND;  Surgeon: Prudy Brownie, DO;  Location: [facility];  Service: Pulmonary;  Laterality: Right;    REVIEW OF SYSTEMS:   Review of Systems  Constitutional: Positive for stable fatigue. Negative for appetite change, chills,   fever and unexpected weight change.  HENT: Negative for mouth sores, nosebleeds, sore throat and trouble swallowing.   Eyes: Negative for eye problems and icterus.  Respiratory: Positive for stable dyspnea on exertion. Stable intermittent cough. Negative for hemoptysis and wheezing.   Cardiovascular: Positive for right sided rib pain since undergoing lobectomy. Positive for stable mild bilateral ankle swelling.  Gastrointestinal: Positive for chronic constipation. Negative for abdominal pain, diarrhea, nausea and vomiting.  Genitourinary: Negative for bladder incontinence, difficulty urinating, dysuria, frequency and hematuria.   Musculoskeletal: Positive for chronic back pain. Negative for problem, neck pain and neck stiffness.  Skin: Negative for itching and rash.  Neurological: Negative for dizziness, extremity weakness, gait problem, headaches, light-headedness and seizures.  Hematological: Negative for adenopathy. Does not bruise/bleed easily.  Psychiatric/Behavioral: Negative for confusion, depression and sleep disturbance. The patient is not nervous/anxious.    PHYSICAL EXAMINATION:  There were no vitals  taken for this visit.  ECOG PERFORMANCE STATUS: 1-2  Physical Exam  Constitutional: Oriented to person, place, and time and well-developed, well-nourished, and in no distress.   HENT:  Head: Normocephalic and atraumatic.  Mouth/Throat: Oropharynx is clear and moist. No oropharyngeal exudate.  Eyes: Conjunctivae are normal. Right eye exhibits no discharge. Left eye exhibits no discharge. No scleral icterus.  Neck: Normal range of motion. Neck supple.  Cardiovascular: Normal rate, regular rhythm, normal heart sounds and intact distal pulses.   Pulmonary/Chest: Effort normal and breath sounds normal. No respiratory distress. No wheezes. No rales.  Abdominal: Soft. Bowel sounds are normal. Exhibits no distension and no mass. There is no tenderness.  Musculoskeletal: Tenderness to  palpation of upper back. Normal range of motion. Exhibits muscle wasting.  Lymphadenopathy:    No cervical adenopathy.  Neurological: Alert and oriented to person, place, and time. Exhibits muscle wasting. Ambulates stooped over due to back pain (she seems to ambulating better today compared to prior visits).  Skin: Skin is warm and dry. No rash noted. Not diaphoretic. No erythema. No pallor.  Psychiatric: Mood, memory and judgment normal.  Vitals reviewed.    LABORATORY DATA: Lab Results  Component Value Date   WBC 7.7 07/10/2023   HGB 13.1 07/10/2023   HCT 42.1 07/10/2023   MCV 94.0 07/10/2023   PLT 182 07/10/2023      Chemistry      Component Value Date/Time   NA 141 07/10/2023 1419   NA 142 07/22/2019 1522   K 4.2 07/10/2023 1419   CL 107 07/10/2023 1419   CO2 29 07/10/2023 1419   BUN 25 (H) 07/10/2023 1419   BUN 13 07/22/2019 1522   CREATININE 1.38 (H) 07/10/2023 1419   CREATININE 0.91 09/16/2011 1000      Component Value Date/Time   CALCIUM 9.2 07/10/2023 1419   ALKPHOS 91 07/10/2023 1419   AST 27 07/10/2023 1419   ALT 16 07/10/2023 1419   BILITOT 0.6 07/10/2023 1419       RADIOGRAPHIC STUDIES:  No results found.   ASSESSMENT/PLAN:  This is a very pleasant 73 year old African-American female diagnosed with metastatic non-small cell lung cancer, adenocarcinoma.  She was initially diagnosed as a stage IIb (T1b, N1, M0).  She is status post right upper lobectomy with lymph node dissection on September 30, 2019 in [place].  She declined adjuvant systemic chemotherapy at that time.   She is found to have metastatic disease to the bone in November 2023.   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   She underwent palliative radiation to T9 under the care of Dr. Mitzi Hansen.   She is currently on systemic therapy.  She was started on systemic chemotherapy with carboplatin for an AUC of 5, Alimta 500 mg/m, and Keytruda 200  mg IV every 3 weeks.  She is status post 22 cycles.  Starting from cycle #5, she started maintenance treatment with immunotherapy with Keytruda only.  Alimta was discontinued secondary to renal insufficiency.   The patient was seen with Dr. Arbutus Ped today.  Dr. Arbutus Ped personally and independently reviewed the scan and discussed results with the patient today.  The scan showed no evidence of disease progression.  Dr. Arbutus Ped recommends she continue on the same treatment at the same dose.   Labs were reviewed. Recommend that she proceed with cycle #23 today as schedule.    We will see her back for follow-up visit in  3 weeks for evaluation repeat blood work before undergoing cycle #24   She was advised to hydrate well at home. We also talked about good blood pressure control with CKD. We also discussed how to interpret her creatinine and GFR.    The patient was advised to call immediately if she has any concerning symptoms in the interval. The patient voices understanding of current disease status and treatment options and is in agreement with the current care plan. All questions were answered. The patient knows to call the clinic with any problems, questions or concerns. We can certainly see the patient much sooner if necessary   No orders of the defined types were placed in this encounter.    Nancee Brownrigg L Dorreen Valiente, PA-C 07/25/23  ADDENDUM: Hematology/Oncology Attending: I had a face-to-face encounter with the patient today.  I reviewed with her record, lab, scan and recommended her care plan.  This is a very pleasant 73 years old African-American female with metastatic non-small cell lung cancer, adenocarcinoma with positive BRAF V600 E mutation and PD-L1 expression of 35%.  She started palliative systemic chemotherapy initially with carboplatin, Alimta and Keytruda for 4 cycles and starting from cycle #5 she has been on maintenance treatment with Keytruda every 3 weeks status post 22  cycles.  She has been tolerating this treatment well with no concerning adverse effects. She had repeat CT scan of the chest, abdomen and pelvis performed recently.  I personally independently reviewed the scan and discussed the result with the patient today. Her scan showed no concerning findings for disease progression. I recommended her to continue her current treatment with maintenance Keytruda and she will proceed with cycle #24 today.  The patient will come back for follow-up visit in 3 weeks for reevaluation before the next cycle of her treatment. She was advised to call immediately if she has any other concerning symptoms in the interval. The total time spent in the appointment was 30 minutes. Disclaimer: This note was dictated with voice recognition software. Similar sounding words can inadvertently be transcribed and may be missed upon review. Aurelio Blower, MD

## 2023-07-30 ENCOUNTER — Inpatient Hospital Stay: Payer: Medicare HMO

## 2023-07-30 ENCOUNTER — Inpatient Hospital Stay: Payer: Medicare HMO | Attending: Physician Assistant

## 2023-07-30 ENCOUNTER — Inpatient Hospital Stay: Payer: Medicare HMO | Admitting: Physician Assistant

## 2023-07-30 VITALS — BP 148/81 | HR 70 | Temp 98.3°F | Resp 20 | Wt 161.0 lb

## 2023-07-30 DIAGNOSIS — Z923 Personal history of irradiation: Secondary | ICD-10-CM | POA: Insufficient documentation

## 2023-07-30 DIAGNOSIS — C3411 Malignant neoplasm of upper lobe, right bronchus or lung: Secondary | ICD-10-CM | POA: Insufficient documentation

## 2023-07-30 DIAGNOSIS — Z902 Acquired absence of lung [part of]: Secondary | ICD-10-CM | POA: Insufficient documentation

## 2023-07-30 DIAGNOSIS — C7951 Secondary malignant neoplasm of bone: Secondary | ICD-10-CM | POA: Insufficient documentation

## 2023-07-30 DIAGNOSIS — Z5112 Encounter for antineoplastic immunotherapy: Secondary | ICD-10-CM | POA: Insufficient documentation

## 2023-07-30 DIAGNOSIS — Z79620 Long term (current) use of immunosuppressive biologic: Secondary | ICD-10-CM | POA: Diagnosis not present

## 2023-07-30 LAB — CMP (CANCER CENTER ONLY)
ALT: 19 U/L (ref 0–44)
AST: 29 U/L (ref 15–41)
Albumin: 4 g/dL (ref 3.5–5.0)
Alkaline Phosphatase: 106 U/L (ref 38–126)
Anion gap: 7 (ref 5–15)
BUN: 29 mg/dL — ABNORMAL HIGH (ref 8–23)
CO2: 28 mmol/L (ref 22–32)
Calcium: 9.3 mg/dL (ref 8.9–10.3)
Chloride: 107 mmol/L (ref 98–111)
Creatinine: 1.47 mg/dL — ABNORMAL HIGH (ref 0.44–1.00)
GFR, Estimated: 37 mL/min — ABNORMAL LOW (ref >60)
Glucose, Bld: 94 mg/dL (ref 70–99)
Potassium: 4 mmol/L (ref 3.5–5.1)
Sodium: 142 mmol/L (ref 135–145)
Total Bilirubin: 0.6 mg/dL (ref 0.0–1.2)
Total Protein: 7.2 g/dL (ref 6.5–8.1)

## 2023-07-30 LAB — CBC WITH DIFFERENTIAL (CANCER CENTER ONLY)
Abs Immature Granulocytes: 0.03 10*3/uL (ref 0.00–0.07)
Basophils Absolute: 0.1 10*3/uL (ref 0.0–0.1)
Basophils Relative: 1 %
Eosinophils Absolute: 1.2 10*3/uL — ABNORMAL HIGH (ref 0.0–0.5)
Eosinophils Relative: 15 %
HCT: 40.1 % (ref 36.0–46.0)
Hemoglobin: 12.6 g/dL (ref 12.0–15.0)
Immature Granulocytes: 0 %
Lymphocytes Relative: 29 %
Lymphs Abs: 2.3 10*3/uL (ref 0.7–4.0)
MCH: 28.7 pg (ref 26.0–34.0)
MCHC: 31.4 g/dL (ref 30.0–36.0)
MCV: 91.3 fL (ref 80.0–100.0)
Monocytes Absolute: 0.6 10*3/uL (ref 0.1–1.0)
Monocytes Relative: 8 %
Neutro Abs: 3.8 10*3/uL (ref 1.7–7.7)
Neutrophils Relative %: 47 %
Platelet Count: 178 10*3/uL (ref 150–400)
RBC: 4.39 MIL/uL (ref 3.87–5.11)
RDW: 13.1 % (ref 11.5–15.5)
WBC Count: 8 10*3/uL (ref 4.0–10.5)
nRBC: 0 % (ref 0.0–0.2)

## 2023-07-30 LAB — TSH: TSH: 0.554 u[IU]/mL (ref 0.350–4.500)

## 2023-07-30 MED ORDER — PEMBROLIZUMAB CHEMO INJECTION 100 MG/4ML
200.0000 mg | Freq: Once | INTRAVENOUS | Status: AC
  Administered 2023-07-30: 200 mg via INTRAVENOUS
  Filled 2023-07-30: qty 200

## 2023-07-30 MED ORDER — SODIUM CHLORIDE 0.9 % IV SOLN: Freq: Once | INTRAVENOUS | Status: AC

## 2023-07-30 NOTE — Patient Instructions (Signed)

## 2023-07-31 LAB — T4: T4, Total: 5.2 ug/dL (ref 4.5–12.0)

## 2023-08-01 ENCOUNTER — Other Ambulatory Visit: Payer: Self-pay

## 2023-08-04 ENCOUNTER — Other Ambulatory Visit: Payer: Self-pay | Admitting: Physician Assistant

## 2023-08-04 DIAGNOSIS — C7951 Secondary malignant neoplasm of bone: Secondary | ICD-10-CM

## 2023-08-14 ENCOUNTER — Other Ambulatory Visit: Payer: Self-pay | Admitting: Internal Medicine

## 2023-08-21 ENCOUNTER — Inpatient Hospital Stay: Payer: Medicare HMO

## 2023-08-21 ENCOUNTER — Telehealth: Payer: Self-pay | Admitting: Internal Medicine

## 2023-08-21 ENCOUNTER — Inpatient Hospital Stay: Payer: Medicare HMO | Admitting: Internal Medicine

## 2023-08-21 ENCOUNTER — Inpatient Hospital Stay: Payer: Medicare HMO | Attending: Physician Assistant

## 2023-08-21 VITALS — BP 142/70 | HR 67 | Temp 97.0°F | Resp 18 | Wt 159.2 lb

## 2023-08-21 DIAGNOSIS — K5900 Constipation, unspecified: Secondary | ICD-10-CM | POA: Diagnosis not present

## 2023-08-21 DIAGNOSIS — Z5112 Encounter for antineoplastic immunotherapy: Secondary | ICD-10-CM | POA: Diagnosis present

## 2023-08-21 DIAGNOSIS — C7951 Secondary malignant neoplasm of bone: Secondary | ICD-10-CM

## 2023-08-21 DIAGNOSIS — N289 Disorder of kidney and ureter, unspecified: Secondary | ICD-10-CM | POA: Insufficient documentation

## 2023-08-21 DIAGNOSIS — Z902 Acquired absence of lung [part of]: Secondary | ICD-10-CM | POA: Diagnosis not present

## 2023-08-21 DIAGNOSIS — C3411 Malignant neoplasm of upper lobe, right bronchus or lung: Secondary | ICD-10-CM | POA: Insufficient documentation

## 2023-08-21 DIAGNOSIS — Z1509 Genetic susceptibility to other malignant neoplasm: Secondary | ICD-10-CM | POA: Diagnosis not present

## 2023-08-21 LAB — CBC WITH DIFFERENTIAL (CANCER CENTER ONLY)
Abs Immature Granulocytes: 0.02 10*3/uL (ref 0.00–0.07)
Basophils Absolute: 0.1 10*3/uL (ref 0.0–0.1)
Basophils Relative: 1 %
Eosinophils Absolute: 1.9 10*3/uL — ABNORMAL HIGH (ref 0.0–0.5)
Eosinophils Relative: 19 %
HCT: 37.9 % (ref 36.0–46.0)
Hemoglobin: 12.4 g/dL (ref 12.0–15.0)
Immature Granulocytes: 0 %
Lymphocytes Relative: 32 %
Lymphs Abs: 3.3 10*3/uL (ref 0.7–4.0)
MCH: 28.7 pg (ref 26.0–34.0)
MCHC: 32.7 g/dL (ref 30.0–36.0)
MCV: 87.7 fL (ref 80.0–100.0)
Monocytes Absolute: 0.9 10*3/uL (ref 0.1–1.0)
Monocytes Relative: 9 %
Neutro Abs: 4 10*3/uL (ref 1.7–7.7)
Neutrophils Relative %: 39 %
Platelet Count: 185 10*3/uL (ref 150–400)
RBC: 4.32 MIL/uL (ref 3.87–5.11)
RDW: 12.9 % (ref 11.5–15.5)
WBC Count: 10.1 10*3/uL (ref 4.0–10.5)
nRBC: 0 % (ref 0.0–0.2)

## 2023-08-21 LAB — CMP (CANCER CENTER ONLY)
ALT: 21 U/L (ref 0–44)
AST: 31 U/L (ref 15–41)
Albumin: 3.8 g/dL (ref 3.5–5.0)
Alkaline Phosphatase: 85 U/L (ref 38–126)
Anion gap: 5 (ref 5–15)
BUN: 26 mg/dL — ABNORMAL HIGH (ref 8–23)
CO2: 27 mmol/L (ref 22–32)
Calcium: 8.9 mg/dL (ref 8.9–10.3)
Chloride: 108 mmol/L (ref 98–111)
Creatinine: 1.42 mg/dL — ABNORMAL HIGH (ref 0.44–1.00)
GFR, Estimated: 39 mL/min — ABNORMAL LOW (ref >60)
Glucose, Bld: 79 mg/dL (ref 70–99)
Potassium: 4.2 mmol/L (ref 3.5–5.1)
Sodium: 140 mmol/L (ref 135–145)
Total Bilirubin: 0.8 mg/dL (ref 0.0–1.2)
Total Protein: 6.8 g/dL (ref 6.5–8.1)

## 2023-08-21 MED ORDER — SODIUM CHLORIDE 0.9 % IV SOLN: Freq: Once | INTRAVENOUS | Status: AC

## 2023-08-21 MED ORDER — SODIUM CHLORIDE 0.9% FLUSH: 10.0000 mL | INTRAVENOUS | Status: DC | PRN

## 2023-08-21 MED ORDER — PEMBROLIZUMAB CHEMO INJECTION 100 MG/4ML
200.0000 mg | Freq: Once | INTRAVENOUS | Status: AC
  Administered 2023-08-21: 200 mg via INTRAVENOUS
  Filled 2023-08-21: qty 200

## 2023-08-21 MED ORDER — HEPARIN SOD (PORK) LOCK FLUSH 100 UNIT/ML IV SOLN: 500.0000 [IU] | Freq: Once | INTRAVENOUS | Status: DC | PRN

## 2023-08-21 NOTE — Progress Notes (Signed)
 [facility] [phone]   [phone]  OFFICE PROGRESS NOTE  [name], MD [address] [place] [address]  DIAGNOSIS: Metastatic non-small cell lung cancer, adenocarcinoma that was initially diagnosed as stage IIb (T1b, N1, M0) non-small cell lung cancer, adenocarcinoma presented with right upper lobe lung nodule in June 2021.   Detected Alteration(s) / Biomarker(s) Associated FDA-approved therapies Clinical Trial Availability % cfDNA or Amplification BRAF V600E approved by FDA Dabrafenib+trametinib, Encorafenib+binimetinib approved in other indication Cobimetinib, Dabrafenib, Trametinib, Vemurafenib, Vemurafenib+cobimetinib Yes 8.3%  BRCA1 R252S None (VUS) None (VUS) 0.1%   SMAD4 D453fs None None 7.2%  PD-L1 expression 35%  PRIOR THERAPY:  1) Status post right upper lobectomy with lymph node dissection on September 30, 2019 in [place] .  She declines adjuvant systemic chemotherapy. 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 Jeryl Moris.  CURRENT THERAPY: Systemic chemotherapy with carboplatin  for AUC of 5, Alimta 500 Mg/M2 and Keytruda  200 Mg IV every 3 weeks.  First dose April 16, 2022.  Status post 23  cycles.  Starting cycle #5 she will be on maintenance treatment with Keytruda  every 3 weeks.  INTERVAL HISTORY: Brandi Holmes 73 y.o. female returns to the clinic today for follow-up visit. Discussed the use of AI scribe software for clinical note transcription with the patient, who gave verbal consent to proceed.  History of Present Illness   Brandi Holmes "Franky Ivanoff" is a 73 year old female with metastatic non-small cell lung cancer who presents for evaluation before starting cycle number twenty-four of maintenance immunotherapy with Keytruda .  She is undergoing maintenance immunotherapy with Keytruda  every  three weeks for her metastatic non-small cell lung cancer, adenocarcinoma. She has completed twenty-three cycles and is here for evaluation before starting the twenty-fourth cycle. She feels good and has not experienced any issues with the treatment.  Her primary doctor changed her medication for constipation. She is now taking lactulose, a tablespoon twice a day, which she states is 'just enough to get me stable'. She is no longer taking magnesia, Prunus, or Ducalex pills.  She reports persistent back pain that is 'really, really' painful to touch, which she attributes to bone metastasis that was previously radiated.  No nausea, vomiting, diarrhea, chest pain, or breathing issues.       MEDICAL HISTORY: Past Medical History:  Diagnosis Date   Anemia    as a child only   Coronary artery calcification of native artery    Hyperlipidemia    Hypertension    Liver spots    " per pt"   Lung nodule    Wears glasses    Wears partial dentures     ALLERGIES:  is allergic to crab [shellfish allergy] and morphine  and codeine.  MEDICATIONS:  Current Outpatient Medications  Medication Sig Dispense Refill   aspirin  EC 81 MG tablet Take 81 mg by mouth at bedtime. Swallow whole.     atorvastatin  (LIPITOR) 20 MG tablet Take 20 mg by mouth at bedtime.     folic acid  (FOLVITE ) 1 MG tablet TAKE ONE TABLET BY MOUTH EVERY DAY 30 tablet 0   furosemide (LASIX) 20 MG tablet Take 20 mg by mouth daily.     lactose free nutrition (BOOST) LIQD Take 237 mLs by mouth 2 (two) times daily between meals.     latanoprost (XALATAN) 0.005 % ophthalmic solution Place  1 drop into both eyes at bedtime.     lisinopril  (ZESTRIL ) 10 MG tablet Take 1 tablet (10 mg total) by mouth every morning. 90 tablet 0   magnesium  citrate SOLN Take 1 Bottle by mouth once a week.     OVER THE COUNTER MEDICATION "Prune X" -2-3 x week     pregabalin  (LYRICA ) 150 MG capsule Take 150 mg by mouth 2 (two) times daily.     prochlorperazine   (COMPAZINE ) 10 MG tablet Take 1 tablet (10 mg total) by mouth every 6 (six) hours as needed for nausea or vomiting. 30 tablet 0   tiZANidine  (ZANAFLEX ) 2 MG tablet Take 2 mg by mouth at bedtime.     traMADol  HCl 100 MG TABS Take 1 tablet by mouth QID. 1 tablet by mouth every 6 hours as needed for pain     No current facility-administered medications for this visit.    SURGICAL HISTORY:  Past Surgical History:  Procedure Laterality Date   ABDOMINAL HYSTERECTOMY  1974   heavy bleeding   ABDOMINAL HYSTERECTOMY     BRONCHIAL BRUSHINGS  08/13/2019   Procedure: BRONCHIAL BRUSHINGS;  Surgeon: Prudy Brownie, DO;  Location: [facility];  Service: Pulmonary;;   BRONCHIAL WASHINGS  08/13/2019   Procedure: BRONCHIAL WASHINGS;  Surgeon: Prudy Brownie, DO;  Location: [facility];  Service: Pulmonary;;   COLONOSCOPY W/ BIOPSIES AND POLYPECTOMY     DILATION AND CURETTAGE OF UTERUS     FINE NEEDLE ASPIRATION  08/13/2019   Procedure: FINE NEEDLE ASPIRATION (FNA) LINEAR;  Surgeon: Prudy Brownie, DO;  Location: [facility];  Service: Pulmonary;;   FOOT SURGERY     bilateral bunions and hammer toes   IR RADIOLOGIST EVAL & MGMT  05/20/2022   LUNG BIOPSY  08/13/2019   Procedure: LUNG BIOPSY;  Surgeon: Prudy Brownie, DO;  Location: [facility];  Service: Pulmonary;;   MULTIPLE TOOTH EXTRACTIONS     VIDEO BRONCHOSCOPY WITH ENDOBRONCHIAL NAVIGATION Right 08/13/2019   Procedure: VIDEO BRONCHOSCOPY WITH ENDOBRONCHIAL NAVIGATION;  Surgeon: Prudy Brownie, DO;  Location: [facility];  Service: Pulmonary;  Laterality: Right;   VIDEO BRONCHOSCOPY WITH ENDOBRONCHIAL ULTRASOUND Right 08/13/2019   Procedure: VIDEO BRONCHOSCOPY WITH ENDOBRONCHIAL ULTRASOUND;  Surgeon: Prudy Brownie, DO;  Location: [facility];  Service: Pulmonary;  Laterality: Right;    REVIEW OF SYSTEMS:  A comprehensive review of systems was negative except for: Constitutional: positive for fatigue Musculoskeletal: positive for back  pain   PHYSICAL EXAMINATION: General appearance: alert, cooperative, fatigued, and no distress Head: Normocephalic, without obvious abnormality, atraumatic Neck: no adenopathy, no JVD, supple, symmetrical, trachea midline, and thyroid  not enlarged, symmetric, no tenderness/mass/nodules Lymph nodes: Cervical, supraclavicular, and axillary nodes normal. Resp: clear to auscultation bilaterally Back: symmetric, no curvature. ROM normal. No CVA tenderness. Cardio: regular rate and rhythm, S1, S2 normal, no murmur, click, rub or gallop GI: soft, non-tender; bowel sounds normal; no masses,  no organomegaly Extremities: extremities normal, atraumatic, no cyanosis or edema  ECOG PERFORMANCE STATUS: 1 - Symptomatic but completely ambulatory  Blood pressure (!) 142/70, pulse 67, temperature (!) 97 F (36.1 C), temperature source Temporal, resp. rate 18, weight 159 lb 3.2 oz (72.2 kg), SpO2 100%.    LABORATORY DATA: Lab Results  Component Value Date   WBC 10.1 08/21/2023   HGB 12.4 08/21/2023   HCT 37.9 08/21/2023   MCV 87.7 08/21/2023   PLT 185 08/21/2023      Chemistry      Component Value Date/Time  NA 140 08/21/2023 1427   NA 142 07/22/2019 1522   K 4.2 08/21/2023 1427   CL 108 08/21/2023 1427   CO2 27 08/21/2023 1427   BUN 26 (H) 08/21/2023 1427   BUN 13 07/22/2019 1522   CREATININE 1.42 (H) 08/21/2023 1427   CREATININE 0.91 09/16/2011 1000      Component Value Date/Time   CALCIUM  8.9 08/21/2023 1427   ALKPHOS 85 08/21/2023 1427   AST 31 08/21/2023 1427   ALT 21 08/21/2023 1427   BILITOT 0.8 08/21/2023 1427       RADIOGRAPHIC STUDIES: CT CHEST ABDOMEN PELVIS WO CONTRAST Result Date: 07/30/2023 CLINICAL DATA:  Metastatic disease evaluation. History of rectosigmoid carcinoma status post resection. * Tracking Code: BO * EXAM: CT CHEST, ABDOMEN AND PELVIS WITHOUT CONTRAST TECHNIQUE: Multidetector CT imaging of the chest, abdomen and pelvis was performed following the  standard protocol without IV contrast. RADIATION DOSE REDUCTION: This exam was performed according to the departmental dose-optimization program which includes automated exposure control, adjustment of the mA and/or kV according to patient size and/or use of iterative reconstruction technique. COMPARISON:  CT scan chest, abdomen and pelvis from 05/15/2023. FINDINGS: CT CHEST FINDINGS Cardiovascular: Normal cardiac size. There is persistent moderate pericardial effusion, without significant interval change. No aortic aneurysm. There are coronary artery calcifications, in keeping with coronary artery disease. There are also mild peripheral atherosclerotic vascular calcifications of thoracic aorta and its major branches. Mediastinum/Nodes: Visualized thyroid  gland appears grossly unremarkable. No solid / cystic mediastinal masses. The esophagus is nondistended precluding optimal assessment. There are few mildly prominent mediastinal lymph nodes, which do not meet the size criteria for lymphadenopathy and appear grossly similar to the prior study, favoring benign etiology. No axillary or hilar lymphadenopathy by size criteria. Lungs/Pleura: The central tracheo-bronchial tree is patent. Note is again made of postsurgical changes from prior right upper lobectomy. There is stable postsurgical fibrosis/scarring in the right upper lobe. Redemonstration of segmental collapse of the right lung lower lobe with associated bronchiectasis and honeycombing. There also stable peripheral reticulations throughout bilateral lungs. No left lung mass, consolidation, pleural effusion or honeycombing. No suspicious lung nodule. No mass or consolidation. No pleural effusion or pneumothorax. No suspicious lung nodules. Musculoskeletal: The visualized soft tissues of the chest wall are grossly unremarkable. Postsurgical changes noted in the posteromedial right seventh through ninth ribs. There are mild-to-moderate compression deformities of  T7 through T11 vertebrae, similar to the prior study. There also multiple mixed sclerotic/lytic foci in the thoracic spine, compatible with metastases. No significant interval change. There are mild to moderate multilevel degenerative changes in the visualized spine. CT ABDOMEN PELVIS FINDINGS Hepatobiliary: The liver is normal in size. Non-cirrhotic configuration. There are at least 5, ill-defined hypoattenuating foci in the liver, which are incompletely characterized on the current exam but appears grossly similar to the prior study. No intrahepatic or extrahepatic bile duct dilation. Moderate-to-large volume dependent hyperattenuating areas in the gallbladder may represent concentrated bile versus sludge. No imaging signs of acute cholecystitis. Normal gallbladder wall thickness. No pericholecystic inflammatory changes. Pancreas: Unremarkable. No pancreatic ductal dilatation or surrounding inflammatory changes. Spleen: Within normal limits. No focal lesion. Adrenals/Urinary Tract: There is diffuse thickening of left adrenal gland, without discrete nodule. Findings are nonspecific but mostly associated with adrenal hyperplasia. Unremarkable right adrenal gland. No suspicious renal mass within the limitations of this unenhanced exam. Redemonstration of focal dystrophic calcification in the right kidney lower pole, laterally. No hydronephrosis. No renal or ureteric calculi. Unremarkable urinary bladder. Stomach/Bowel:  No disproportionate dilation of the small or large bowel loops. No evidence of abnormal bowel wall thickening or inflammatory changes. The appendix is unremarkable. Vascular/Lymphatic: No ascites or pneumoperitoneum. No abdominal or pelvic lymphadenopathy, by size criteria. No aneurysmal dilation of the major abdominal arteries. There are moderate peripheral atherosclerotic vascular calcifications of the aorta and its major branches. Reproductive: The uterus is surgically absent. No large adnexal mass.  Other: The visualized soft tissues and abdominal wall are unremarkable. Musculoskeletal: Redemonstration of sclerotic foci in the lumbar spine, most pronounced at L5 vertebral body level without significant interval change. There are moderate multilevel degenerative changes in the visualized spine. IMPRESSION: 1. Redemonstration of postsurgical changes in the right lung. No new or suspicious lung mass, consolidation or pleural effusion. 2. Redemonstration of multiple mixed sclerotic/lytic foci in the thoracolumbar spine, compatible with metastases. No significant interval change. 3. No other metastatic disease identified within the chest, abdomen or pelvis. 4. Multiple other nonacute observations (such as stable moderate pericardial effusion, changes of pulmonary fibrosis in bilateral lungs, stable hypoattenuating liver foci, diffuse thickening of left adrenal gland, etc.), As described above. Aortic Atherosclerosis (ICD10-I70.0). Electronically Signed   By: Beula Brunswick M.D.   On: 07/30/2023 09:23     ASSESSMENT AND PLAN: This is a pleasant 73 years old African-American female diagnosed with metastatic also lung cancer, adenocarcinoma initially diagnosed as stage IIb (T1b, N1, M0) non-small cell lung cancer, adenocarcinoma status post a right upper lobectomy with lymph node dissection on September 30, 2019 in [place] .  She declined adjuvant systemic chemotherapy at that time. She was found to have evidence for disease metastasis in November 2023 presenting with multiple metastatic bone lesions. The patient had molecular studies that showed an actionable mutation with BRAF V600E mutation and PD-L1 expression of 35%. She underwent palliative radiotherapy to the T9 vertebral lesion under the care of Dr. Jeryl Moris. The patient is currently undergoing first-line combination of systemic chemotherapy with carboplatin  for AUC of 5, Alimta 500 Mg/M2 and Keytruda  200 Mg IV every 3 weeks.  Status post 23 cycles.  Starting from  cycle #5 she will be on maintenance treatment with Alimta and Keytruda  every 3 weeks.  Starting from cycle #5 she will be on single agent Keytruda  only.  Alimta will be discontinued secondary to renal insufficiency. She has been tolerating this treatment fairly well.    Metastatic non-small cell lung cancer Adenocarcinoma subtype, well-managed on maintenance immunotherapy with Keytruda . Completed 23 cycles, starting cycle 24. No new symptoms, recent scans favorable. - Administer Keytruda  for cycle 24  Bone metastasis Persistent back pain likely due to bone metastasis, previously treated with radiation.  Constipation Symptoms stabilized with lactulose. Discontinued magnesium  and Ducalex. - Continue lactulose, 1 tablespoon twice daily   The patient was advised to call immediately if she has any concerning symptoms in the interval. The patient voices understanding of current disease status and treatment options and is in agreement with the current care plan. The total time spent in the appointment was 20 minutes.  All questions were answered. The patient knows to call the clinic with any problems, questions or concerns. We can certainly see the patient much sooner if necessary.  Disclaimer: This note was dictated with voice recognition software. Similar sounding words can inadvertently be transcribed and may not be corrected upon review.

## 2023-08-21 NOTE — Patient Instructions (Signed)

## 2023-08-24 ENCOUNTER — Other Ambulatory Visit: Payer: Self-pay

## 2023-09-08 NOTE — Progress Notes (Unsigned)
 [facility] OFFICE PROGRESS NOTE  Brandi Dunn, MD [address] [place] [address]  DIAGNOSIS: Metastatic non-small cell lung cancer, adenocarcinoma that was initially diagnosed as stage IIb (T1b, N1, M0) non-small cell lung cancer, adenocarcinoma presented with right upper lobe lung nodule   Detected Alteration(s) / Biomarker(s) Associated FDA-approved therapiesClinical Trial Availability% cfDNA or Amplification BRAF V600E approved by FDA Dabrafenib+trametinib, Encorafenib+binimetinib approved in other indication Cobimetinib, Dabrafenib, Trametinib, Vemurafenib, Vemurafenib+cobimetinib Yes8.3%   BRCA1 R252S None (VUS) None (VUS) 0.1%     SMAD4 D479fs None None 7.2%   PD-L1 expression 35%  PRIOR THERAPY: 1) Status post right upper lobectomy with lymph node dissection on September 30, 2019 in [place] .  She declines adjuvant systemic chemotherapy. 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 Jeryl Moris.  CURRENT THERAPY: : Systemic chemotherapy with carboplatin  for AUC of 5, Alimta 500 Mg/M2 and Keytruda  200 Mg IV every 3 weeks. First dose April 16, 2022. Status post 24 cycles. Starting cycle #5 she will be on maintenance treatment with Keytruda  every 3 weeks.   INTERVAL HISTORY: Brandi Holmes 73 y.o. female returns to the clinic for a follow-up visit. The patient is feeling fairly today without any concerning complaints except for weight gain. Her last thyroid  studies were normal but were last drawn in April. She states it is from her dietary intake. She was last seen by Dr. Marguerita Shih 3 weeks ago.  She is currently undergoing maintenance treatment with immunotherapy with Keytruda .  She is tolerating this well. Alimta ws discontinued due to CKD. Today, she denies any fever, night sweats, or unexplained weight loss. Denies any changes with her breathing  since last being seen. She still has some rib pain from her prior surgery site from 4 years ago. She is prescribed lyrica  and tramadol . She had a nerve block performed in the past which was not effective.  She denies worsening or new cough. Denies any nausea, vomiting, or diarrhea.  She struggles with chronic constipation. Her PCP recently put her on lactulose which has helped some.  Denies any headaches or visual changes.  Denies any rashes or skin changes.  She is here for evaluation and repeat blood work before undergoing cycle #25 today.     MEDICAL HISTORY: Past Medical History:  Diagnosis Date   Anemia    as a child only   Coronary artery calcification of native artery    Hyperlipidemia    Hypertension    Liver spots    " per pt"   Lung nodule    Wears glasses    Wears partial dentures     ALLERGIES:  is allergic to crab [shellfish allergy] and morphine  and codeine.  MEDICATIONS:  Current Outpatient Medications  Medication Sig Dispense Refill   aspirin  EC 81 MG tablet Take 81 mg by mouth at bedtime. Swallow whole.     atorvastatin  (LIPITOR) 20 MG tablet Take 20 mg by mouth at bedtime.     folic acid  (FOLVITE ) 1 MG tablet TAKE ONE TABLET BY MOUTH EVERY DAY 30 tablet 0   furosemide (LASIX) 20 MG tablet Take 20 mg by mouth daily.     lactose free nutrition (BOOST) LIQD Take 237 mLs by mouth 2 (two) times daily between meals.     lactulose (CHRONULAC) 10 GM/15ML solution Take 10 g by mouth 2 (two) times daily.     latanoprost (  XALATAN) 0.005 % ophthalmic solution Place 1 drop into both eyes at bedtime.     lisinopril  (ZESTRIL ) 10 MG tablet Take 1 tablet (10 mg total) by mouth every morning. 90 tablet 0   pregabalin  (LYRICA ) 150 MG capsule Take 150 mg by mouth 2 (two) times daily.     prochlorperazine  (COMPAZINE ) 10 MG tablet Take 1 tablet (10 mg total) by mouth every 6 (six) hours as needed for nausea or vomiting. 30 tablet 0   tiZANidine  (ZANAFLEX ) 2 MG tablet Take 2 mg by mouth  at bedtime.     traMADol  HCl 100 MG TABS Take 1 tablet by mouth QID. 1 tablet by mouth every 6 hours as needed for pain     No current facility-administered medications for this visit.    SURGICAL HISTORY:  Past Surgical History:  Procedure Laterality Date   ABDOMINAL HYSTERECTOMY  1974   heavy bleeding   ABDOMINAL HYSTERECTOMY     BRONCHIAL BRUSHINGS  08/13/2019   Procedure: BRONCHIAL BRUSHINGS;  Surgeon: Prudy Brownie, DO;  Location: [facility];  Service: Pulmonary;;   BRONCHIAL WASHINGS  08/13/2019   Procedure: BRONCHIAL WASHINGS;  Surgeon: Prudy Brownie, DO;  Location: [facility];  Service: Pulmonary;;   COLONOSCOPY W/ BIOPSIES AND POLYPECTOMY     DILATION AND CURETTAGE OF UTERUS     FINE NEEDLE ASPIRATION  08/13/2019   Procedure: FINE NEEDLE ASPIRATION (FNA) LINEAR;  Surgeon: Prudy Brownie, DO;  Location: [facility];  Service: Pulmonary;;   FOOT SURGERY     bilateral bunions and hammer toes   IR RADIOLOGIST EVAL & MGMT  05/20/2022   LUNG BIOPSY  08/13/2019   Procedure: LUNG BIOPSY;  Surgeon: Prudy Brownie, DO;  Location: [facility];  Service: Pulmonary;;   MULTIPLE TOOTH EXTRACTIONS     VIDEO BRONCHOSCOPY WITH ENDOBRONCHIAL NAVIGATION Right 08/13/2019   Procedure: VIDEO BRONCHOSCOPY WITH ENDOBRONCHIAL NAVIGATION;  Surgeon: Prudy Brownie, DO;  Location: [facility];  Service: Pulmonary;  Laterality: Right;   VIDEO BRONCHOSCOPY WITH ENDOBRONCHIAL ULTRASOUND Right 08/13/2019   Procedure: VIDEO BRONCHOSCOPY WITH ENDOBRONCHIAL ULTRASOUND;  Surgeon: Prudy Brownie, DO;  Location: [facility];  Service: Pulmonary;  Laterality: Right;    REVIEW OF SYSTEMS:   Review of Systems  Constitutional: Stable fatigue. Negative for appetite change, chills, fever and unexpected weight change.  HENT: Negative for mouth sores, nosebleeds, sore throat and trouble swallowing.   Eyes: Negative for eye problems and icterus.  Respiratory: Positive for stable dyspnea on exertion.   Negative for hemoptysis, cough, and wheezing.  Cardiovascular: Negative for chest pain and leg swelling.  Gastrointestinal: Positive for chronic constipation. Negative for abdominal pain, diarrhea, nausea and vomiting.  Genitourinary: Negative for bladder incontinence, difficulty urinating, dysuria, frequency and hematuria.   Musculoskeletal: Positive for chronic back and right rib pain. Negative for back pain, gait problem, neck pain and neck stiffness.  Skin: Negative for itching and rash.  Neurological: Negative for dizziness, extremity weakness, gait problem, headaches, light-headedness and seizures.  Hematological: Negative for adenopathy. Does not bruise/bleed easily.  Psychiatric/Behavioral: Negative for confusion, depression and sleep disturbance. The patient is not nervous/anxious.     PHYSICAL EXAMINATION:  Blood pressure (!) 140/69, pulse 67, temperature 98.4 F (36.9 C), temperature source Temporal, resp. rate 17, height 5\' 6"  (1.676 m), weight 162 lb 8 oz (73.7 kg), SpO2 100%.  ECOG PERFORMANCE STATUS: 1-2  Physical Exam  Constitutional: Oriented to person, place, and time and well-developed, well-nourished, and in no distress.  HENT:  Head: Normocephalic and atraumatic.  Mouth/Throat: Oropharynx is clear and moist. No oropharyngeal exudate.  Eyes: Conjunctivae are normal. Right eye exhibits no discharge. Left eye exhibits no discharge. No scleral icterus.  Neck: Normal range of motion. Neck supple.  Cardiovascular: Normal rate, regular rhythm, normal heart sounds and intact distal pulses.   Pulmonary/Chest: Effort normal and breath sounds normal. No respiratory distress. No wheezes. No rales.  Abdominal: Soft. Bowel sounds are normal. Exhibits no distension and no mass. There is no tenderness.  Musculoskeletal: Tenderness to palpation of upper back. Normal range of motion. Exhibits muscle wasting.  Lymphadenopathy:    No cervical adenopathy.  Neurological: Alert and  oriented to person, place, and time. Exhibits muscle wasting. Ambulates stooped over due to back pain (she seems to ambulating better today compared to prior visits).  Skin: Skin is warm and dry. No rash noted. Not diaphoretic. No erythema. No pallor.  Psychiatric: Mood, memory and judgment normal.  Vitals reviewed.    LABORATORY DATA: Lab Results  Component Value Date   WBC 10.2 09/11/2023   HGB 12.6 09/11/2023   HCT 38.9 09/11/2023   MCV 89.4 09/11/2023   PLT 186 09/11/2023      Chemistry      Component Value Date/Time   NA 140 08/21/2023 1427   NA 142 07/22/2019 1522   K 4.2 08/21/2023 1427   CL 108 08/21/2023 1427   CO2 27 08/21/2023 1427   BUN 26 (H) 08/21/2023 1427   BUN 13 07/22/2019 1522   CREATININE 1.42 (H) 08/21/2023 1427   CREATININE 0.91 09/16/2011 1000      Component Value Date/Time   CALCIUM  8.9 08/21/2023 1427   ALKPHOS 85 08/21/2023 1427   AST 31 08/21/2023 1427   ALT 21 08/21/2023 1427   BILITOT 0.8 08/21/2023 1427       RADIOGRAPHIC STUDIES:  No results found.   ASSESSMENT/PLAN:  This is a very pleasant 73 year old African-American female diagnosed with metastatic non-small cell lung cancer, adenocarcinoma.  She was initially diagnosed as a stage IIb (T1b, N1, M0).  She is status post right upper lobectomy with lymph node dissection on September 30, 2019 in [place] .  She declined adjuvant systemic chemotherapy at that time.   She is found to have metastatic disease to the bone in November 2023.   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   She underwent palliative radiation to T9 under the care of Dr. Jeryl Moris.   She is currently on systemic therapy.  She was started on systemic chemotherapy with carboplatin  for an AUC of 5, Alimta 500 mg/m, and Keytruda  200 mg IV every 3 weeks.  She is status post 24 cycles.  Starting from cycle #5, she started maintenance treatment with immunotherapy with Keytruda   only.  Alimta was discontinued secondary to renal insufficiency.    Labs were reviewed. Recommend that she proceed with cycle #25 today as schedule.    We will see her back for follow-up visit in 3 weeks for evaluation repeat blood work before undergoing cycle #26   She will continue lyrica  and tramadol  for her chronic back pain. She also uses heating pad.    The patient was advised to call immediately if she has any concerning symptoms in the interval. The patient voices understanding of current disease status and treatment options and is in agreement with the current care plan. All questions were answered. The patient knows to call  the clinic with any problems, questions or concerns. We can certainly see the patient much sooner if necessary    No orders of the defined types were placed in this encounter.    The total time spent in the appointment was 20-29 minutes.   Imoni Kohen L Tecia Cinnamon, PA-C 09/11/23

## 2023-09-11 ENCOUNTER — Encounter: Payer: Self-pay | Admitting: Internal Medicine

## 2023-09-11 ENCOUNTER — Inpatient Hospital Stay: Payer: Medicare HMO

## 2023-09-11 ENCOUNTER — Inpatient Hospital Stay (HOSPITAL_BASED_OUTPATIENT_CLINIC_OR_DEPARTMENT_OTHER): Payer: Medicare HMO | Admitting: Physician Assistant

## 2023-09-11 VITALS — BP 140/69 | HR 67 | Temp 98.4°F | Resp 17 | Ht 66.0 in | Wt 162.5 lb

## 2023-09-11 DIAGNOSIS — C7951 Secondary malignant neoplasm of bone: Secondary | ICD-10-CM

## 2023-09-11 DIAGNOSIS — Z5112 Encounter for antineoplastic immunotherapy: Secondary | ICD-10-CM

## 2023-09-11 DIAGNOSIS — C3411 Malignant neoplasm of upper lobe, right bronchus or lung: Secondary | ICD-10-CM | POA: Diagnosis not present

## 2023-09-11 LAB — CMP (CANCER CENTER ONLY)
ALT: 23 U/L (ref 0–44)
AST: 34 U/L (ref 15–41)
Albumin: 3.8 g/dL (ref 3.5–5.0)
Alkaline Phosphatase: 83 U/L (ref 38–126)
Anion gap: 5 (ref 5–15)
BUN: 24 mg/dL — ABNORMAL HIGH (ref 8–23)
CO2: 28 mmol/L (ref 22–32)
Calcium: 9.2 mg/dL (ref 8.9–10.3)
Chloride: 108 mmol/L (ref 98–111)
Creatinine: 1.39 mg/dL — ABNORMAL HIGH (ref 0.44–1.00)
GFR, Estimated: 40 mL/min — ABNORMAL LOW (ref >60)
Glucose, Bld: 90 mg/dL (ref 70–99)
Potassium: 4.1 mmol/L (ref 3.5–5.1)
Sodium: 141 mmol/L (ref 135–145)
Total Bilirubin: 0.8 mg/dL (ref 0.0–1.2)
Total Protein: 6.9 g/dL (ref 6.5–8.1)

## 2023-09-11 LAB — CBC WITH DIFFERENTIAL (CANCER CENTER ONLY)
Abs Immature Granulocytes: 0.01 10*3/uL (ref 0.00–0.07)
Basophils Absolute: 0.1 10*3/uL (ref 0.0–0.1)
Basophils Relative: 1 %
Eosinophils Absolute: 2.9 10*3/uL — ABNORMAL HIGH (ref 0.0–0.5)
Eosinophils Relative: 29 %
HCT: 38.9 % (ref 36.0–46.0)
Hemoglobin: 12.6 g/dL (ref 12.0–15.0)
Immature Granulocytes: 0 %
Lymphocytes Relative: 23 %
Lymphs Abs: 2.3 10*3/uL (ref 0.7–4.0)
MCH: 29 pg (ref 26.0–34.0)
MCHC: 32.4 g/dL (ref 30.0–36.0)
MCV: 89.4 fL (ref 80.0–100.0)
Monocytes Absolute: 0.7 10*3/uL (ref 0.1–1.0)
Monocytes Relative: 7 %
Neutro Abs: 4.1 10*3/uL (ref 1.7–7.7)
Neutrophils Relative %: 40 %
Platelet Count: 186 10*3/uL (ref 150–400)
RBC: 4.35 MIL/uL (ref 3.87–5.11)
RDW: 12.9 % (ref 11.5–15.5)
WBC Count: 10.2 10*3/uL (ref 4.0–10.5)
nRBC: 0 % (ref 0.0–0.2)

## 2023-09-11 MED ORDER — SODIUM CHLORIDE 0.9 % IV SOLN: Freq: Once | INTRAVENOUS | Status: AC

## 2023-09-11 MED ORDER — SODIUM CHLORIDE 0.9 % IV SOLN
200.0000 mg | Freq: Once | INTRAVENOUS | Status: AC
  Administered 2023-09-11: 200 mg via INTRAVENOUS
  Filled 2023-09-11: qty 200

## 2023-09-11 NOTE — Patient Instructions (Signed)
 [facility] - A DEPT OF [facility]  Discharge Instructions: Thank you for choosing [name] to provide your oncology and hematology care.   If you have a lab appointment with the Cancer Center, please go directly to the Cancer Center and check in at the registration area.   Wear comfortable clothing and clothing appropriate for easy access to any Portacath or PICC line.   We strive to give you quality time with your provider. You may need to reschedule your appointment if you arrive late (15 or more minutes).  Arriving late affects you and other patients whose appointments are after yours.  Also, if you miss three or more appointments without notifying the office, you may be dismissed from the clinic at the provider's discretion.      For prescription refill requests, have your pharmacy contact our office and allow 72 hours for refills to be completed.    Today you received the following chemotherapy and/or immunotherapy agents Brandi Holmes      To help prevent nausea and vomiting after your treatment, we encourage you to take your nausea medication as directed.  BELOW ARE SYMPTOMS THAT SHOULD BE REPORTED IMMEDIATELY: *FEVER GREATER THAN 100.4 F (38 C) OR HIGHER *CHILLS OR SWEATING *NAUSEA AND VOMITING THAT IS NOT CONTROLLED WITH YOUR NAUSEA MEDICATION *UNUSUAL SHORTNESS OF BREATH *UNUSUAL BRUISING OR BLEEDING *URINARY PROBLEMS (pain or burning when urinating, or frequent urination) *BOWEL PROBLEMS (unusual diarrhea, constipation, pain near the anus) TENDERNESS IN MOUTH AND THROAT WITH OR WITHOUT PRESENCE OF ULCERS (sore throat, sores in mouth, or a toothache) UNUSUAL RASH, SWELLING OR PAIN  UNUSUAL VAGINAL DISCHARGE OR ITCHING   Items with * indicate a potential emergency and should be followed up as soon as possible or go to the Emergency Department if any problems should occur.  Please show the CHEMOTHERAPY ALERT CARD or IMMUNOTHERAPY  ALERT CARD at check-in to the Emergency Department and triage nurse.  Should you have questions after your visit or need to cancel or reschedule your appointment, please contact [facility] - A DEPT [facility]  Dept: [phone]  and follow the prompts.  Office hours are 8:00 a.m. to 4:30 p.m. Monday - Friday. Please note that voicemails left after 4:00 p.m. may not be returned until the following business day.  We are closed weekends and major holidays. You have access to a nurse at all times for urgent questions. Please call the main number to the clinic Dept: [phone] and follow the prompts.   For any non-urgent questions, you may also contact your provider using MyChart. We now offer e-Visits for anyone 85 and older to request care online for non-urgent symptoms. For details visit [email or web address].   Also download the MyChart app! Go to the app store, search "MyChart", open the app, select , and log in with your MyChart username and password.

## 2023-09-13 ENCOUNTER — Other Ambulatory Visit: Payer: Self-pay

## 2023-09-24 ENCOUNTER — Other Ambulatory Visit: Payer: Self-pay

## 2023-09-29 NOTE — Progress Notes (Signed)
 [facility] OFFICE PROGRESS NOTE  Brandi Dunn, MD [address] [place] [address]  DIAGNOSIS: Metastatic non-small cell lung cancer, adenocarcinoma that was initially diagnosed as stage IIb (T1b, N1, M0) non-small cell lung cancer, adenocarcinoma presented with right upper lobe lung nodule   Detected Alteration(s) / Biomarker(s) Associated FDA-approved therapiesClinical Trial Availability% cfDNA or Amplification BRAF V600E approved by FDA Dabrafenib+trametinib, Encorafenib+binimetinib approved in other indication Cobimetinib, Dabrafenib, Trametinib, Vemurafenib, Vemurafenib+cobimetinib Yes8.3%   BRCA1 R252S None (VUS) None (VUS) 0.1%     SMAD4 D470fs None None 7.2%   PD-L1 expression 35%  PRIOR THERAPY: 1) Status post right upper lobectomy with lymph node dissection on September 30, 2019 in [place] .  She declines adjuvant systemic chemotherapy. 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 Jeryl Moris.  CURRENT THERAPY: Systemic chemotherapy with carboplatin  for AUC of 5, Alimta 500 Mg/M2 and Keytruda  200 Mg IV every 3 weeks. First dose April 16, 2022. Status post 25 cycles. Starting cycle #5 she will be on maintenance treatment with Keytruda  every 3 weeks.   INTERVAL HISTORY: Brandi Holmes 73 y.o. female returns to the clinic for a follow-up visit. The patient is feeling fairly today without any concerning complaints.   She is currently undergoing maintenance treatment with immunotherapy with Keytruda . Alimta ws discontinued due to CKD.  She has no issues with her recent immunotherapy treatment. She maintains a strong appetite and has gained weight, nearing her previous weight.   No fevers, chills, or night sweats. No new nausea, vomiting, diarrhea, or constipation. She denies rashes or skin changes. She denies changes with her breathing. She  previously experienced constipation but managed it with medication and dietary changes, including eating watermelon, which she notes causes diarrhea. She coughed up phlegm last night x1 that was yellow and she is wondering if she is a carrier for COVID-19. She still has some rib pain from her prior surgery site from 4 years ago. She is prescribed lyrica  and tramadol . She is trying to take her pain medication on a regular basis. She reports she is moving better because she is taking it which is managing her pain. She previously tried to wean herself off her pain medication but her pain was not controlled in those cases and affected her mobility.   She is here for evaluation and repeat blood work before undergoing cycle #26 today.   MEDICAL HISTORY: Past Medical History:  Diagnosis Date   Anemia    as a child only   Coronary artery calcification of native artery    Hyperlipidemia    Hypertension    Liver spots     per pt   Lung nodule    Wears glasses    Wears partial dentures     ALLERGIES:  is allergic to crab [shellfish allergy] and morphine  and codeine.  MEDICATIONS:  Current Outpatient Medications  Medication Sig Dispense Refill   aspirin  EC 81 MG tablet Take 81 mg by mouth at bedtime. Swallow whole.     atorvastatin  (LIPITOR) 20 MG tablet Take 20 mg by mouth at bedtime.     folic acid  (FOLVITE ) 1 MG tablet TAKE ONE TABLET BY MOUTH EVERY DAY 30 tablet 0   furosemide (LASIX) 20 MG tablet Take 20 mg by mouth daily.     lactose free nutrition (BOOST) LIQD Take 237 mLs by mouth 2 (two) times daily between meals.  lactulose (CHRONULAC) 10 GM/15ML solution Take 10 g by mouth 2 (two) times daily.     latanoprost (XALATAN) 0.005 % ophthalmic solution Place 1 drop into both eyes at bedtime.     lisinopril  (ZESTRIL ) 10 MG tablet Take 1 tablet (10 mg total) by mouth every morning. 90 tablet 0   pregabalin  (LYRICA ) 150 MG capsule Take 150 mg by mouth 2 (two) times daily.      prochlorperazine  (COMPAZINE ) 10 MG tablet Take 1 tablet (10 mg total) by mouth every 6 (six) hours as needed for nausea or vomiting. 30 tablet 0   tiZANidine  (ZANAFLEX ) 2 MG tablet Take 2 mg by mouth at bedtime.     traMADol  HCl 100 MG TABS Take 1 tablet by mouth QID. 1 tablet by mouth every 6 hours as needed for pain     No current facility-administered medications for this visit.    SURGICAL HISTORY:  Past Surgical History:  Procedure Laterality Date   ABDOMINAL HYSTERECTOMY  1974   heavy bleeding   ABDOMINAL HYSTERECTOMY     BRONCHIAL BRUSHINGS  08/13/2019   Procedure: BRONCHIAL BRUSHINGS;  Surgeon: Prudy Brownie, DO;  Location: [facility];  Service: Pulmonary;;   BRONCHIAL WASHINGS  08/13/2019   Procedure: BRONCHIAL WASHINGS;  Surgeon: Prudy Brownie, DO;  Location: [facility];  Service: Pulmonary;;   COLONOSCOPY W/ BIOPSIES AND POLYPECTOMY     DILATION AND CURETTAGE OF UTERUS     FINE NEEDLE ASPIRATION  08/13/2019   Procedure: FINE NEEDLE ASPIRATION (FNA) LINEAR;  Surgeon: Prudy Brownie, DO;  Location: [facility];  Service: Pulmonary;;   FOOT SURGERY     bilateral bunions and hammer toes   IR RADIOLOGIST EVAL & MGMT  05/20/2022   LUNG BIOPSY  08/13/2019   Procedure: LUNG BIOPSY;  Surgeon: Prudy Brownie, DO;  Location: [facility];  Service: Pulmonary;;   MULTIPLE TOOTH EXTRACTIONS     VIDEO BRONCHOSCOPY WITH ENDOBRONCHIAL NAVIGATION Right 08/13/2019   Procedure: VIDEO BRONCHOSCOPY WITH ENDOBRONCHIAL NAVIGATION;  Surgeon: Prudy Brownie, DO;  Location: [facility];  Service: Pulmonary;  Laterality: Right;   VIDEO BRONCHOSCOPY WITH ENDOBRONCHIAL ULTRASOUND Right 08/13/2019   Procedure: VIDEO BRONCHOSCOPY WITH ENDOBRONCHIAL ULTRASOUND;  Surgeon: Prudy Brownie, DO;  Location: [facility];  Service: Pulmonary;  Laterality: Right;    REVIEW OF SYSTEMS:   Review of Systems  Constitutional: Stable fatigue. Negative for appetite change, chills, fever and unexpected  weight change.  HENT: Negative for mouth sores, nosebleeds, sore throat and trouble swallowing.   Eyes: Negative for eye problems and icterus.  Respiratory: Positive for stable dyspnea on exertion. Negative for hemoptysis and wheezing.  Cardiovascular: Negative for chest pain and leg swelling.  Gastrointestinal: Negative for abdominal pain, constipation, diarrhea, nausea and vomiting.  Genitourinary: Negative for bladder incontinence, difficulty urinating, dysuria, frequency and hematuria.   Musculoskeletal: Positive for chronic back and right rib pain. Negative for back pain, gait problem, neck pain and neck stiffness.  Skin: Negative for itching and rash.  Neurological: Negative for dizziness, extremity weakness, gait problem, headaches, light-headedness and seizures.  Hematological: Negative for adenopathy. Does not bruise/bleed easily.  Psychiatric/Behavioral: Negative for confusion, depression and sleep disturbance. The patient is not nervous/anxious.    PHYSICAL EXAMINATION:  There were no vitals taken for this visit.  ECOG PERFORMANCE STATUS: 1-2  Physical Exam  Constitutional: Oriented to person, place, and time and well-developed, well-nourished, and in no distress.   HENT:  Head: Normocephalic and atraumatic.  Mouth/Throat: Oropharynx is  clear and moist. No oropharyngeal exudate.  Eyes: Conjunctivae are normal. Right eye exhibits no discharge. Left eye exhibits no discharge. No scleral icterus.  Neck: Normal range of motion. Neck supple.  Cardiovascular: Normal rate, regular rhythm, normal heart sounds and intact distal pulses.   Pulmonary/Chest: Effort normal and breath sounds normal. No respiratory distress. No wheezes. No rales.  Abdominal: Soft. Bowel sounds are normal. Exhibits no distension and no mass. There is no tenderness.  Musculoskeletal: Tenderness to palpation of upper back. Normal range of motion. Exhibits muscle wasting.  Lymphadenopathy:    No cervical  adenopathy.  Neurological: Alert and oriented to person, place, and time. Exhibits muscle wasting. Ambulates stooped over due to back pain (she seems to ambulating better today compared to prior visits).  Skin: Skin is warm and dry. No rash noted. Not diaphoretic. No erythema. No pallor.  Psychiatric: Mood, memory and judgment normal.  Vitals reviewed.  LABORATORY DATA: Lab Results  Component Value Date   WBC 10.2 09/11/2023   HGB 12.6 09/11/2023   HCT 38.9 09/11/2023   MCV 89.4 09/11/2023   PLT 186 09/11/2023      Chemistry      Component Value Date/Time   NA 141 09/11/2023 1409   NA 142 07/22/2019 1522   K 4.1 09/11/2023 1409   CL 108 09/11/2023 1409   CO2 28 09/11/2023 1409   BUN 24 (H) 09/11/2023 1409   BUN 13 07/22/2019 1522   CREATININE 1.39 (H) 09/11/2023 1409   CREATININE 0.91 09/16/2011 1000      Component Value Date/Time   CALCIUM  9.2 09/11/2023 1409   ALKPHOS 83 09/11/2023 1409   AST 34 09/11/2023 1409   ALT 23 09/11/2023 1409   BILITOT 0.8 09/11/2023 1409       RADIOGRAPHIC STUDIES:  No results found.   ASSESSMENT/PLAN:  This is a very pleasant 73 year old African-American female diagnosed with metastatic non-small cell lung cancer, adenocarcinoma.  She was initially diagnosed as a stage IIb (T1b, N1, M0).  She is status post right upper lobectomy with lymph node dissection on September 30, 2019 in [place] .  She declined adjuvant systemic chemotherapy at that time.   She is found to have metastatic disease to the bone in November 2023.   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   She underwent palliative radiation to T9 under the care of Dr. Jeryl Moris.   She is currently on systemic therapy.  She was started on systemic chemotherapy with carboplatin  for an AUC of 5, Alimta 500 mg/m, and Keytruda  200 mg IV every 3 weeks.  She is status post 25 cycles.  Starting from cycle #5, she started maintenance treatment  with immunotherapy with Keytruda  only.  Alimta was discontinued secondary to renal insufficiency.    Labs were reviewed. Recommend that she proceed with cycle #26 today as schedule. She is ok to treat with a creatinine of 1.58   We will see her back for follow-up visit in 3 weeks for evaluation repeat blood work before undergoing cycle #27.   She will continue lyrica  and tramadol  for her chronic back pain. She also uses heating pad.   I will arrange for a restaging CT scan of the CAP prior to her next cycle of treatment.   The patient was advised to call immediately if she has any concerning symptoms in the interval. The patient voices understanding of current disease status and treatment options and is  in agreement with the current care plan. All questions were answered. The patient knows to call the clinic with any problems, questions or concerns. We can certainly see the patient much sooner if necessary     No orders of the defined types were placed in this encounter.    The total time spent in the appointment was 20-29 minutes  Kymberli Wiegand L Helayne Metsker, PA-C 09/29/23

## 2023-10-02 ENCOUNTER — Inpatient Hospital Stay (HOSPITAL_BASED_OUTPATIENT_CLINIC_OR_DEPARTMENT_OTHER): Admitting: Physician Assistant

## 2023-10-02 ENCOUNTER — Other Ambulatory Visit

## 2023-10-02 ENCOUNTER — Inpatient Hospital Stay: Attending: Physician Assistant

## 2023-10-02 ENCOUNTER — Ambulatory Visit: Admitting: Internal Medicine

## 2023-10-02 ENCOUNTER — Ambulatory Visit

## 2023-10-02 ENCOUNTER — Inpatient Hospital Stay

## 2023-10-02 VITALS — BP 127/74 | HR 66 | Temp 98.5°F | Resp 19 | Ht 66.0 in | Wt 164.5 lb

## 2023-10-02 DIAGNOSIS — C7951 Secondary malignant neoplasm of bone: Secondary | ICD-10-CM | POA: Diagnosis not present

## 2023-10-02 DIAGNOSIS — G8929 Other chronic pain: Secondary | ICD-10-CM | POA: Insufficient documentation

## 2023-10-02 DIAGNOSIS — C3411 Malignant neoplasm of upper lobe, right bronchus or lung: Secondary | ICD-10-CM | POA: Insufficient documentation

## 2023-10-02 DIAGNOSIS — Z923 Personal history of irradiation: Secondary | ICD-10-CM | POA: Diagnosis not present

## 2023-10-02 DIAGNOSIS — N289 Disorder of kidney and ureter, unspecified: Secondary | ICD-10-CM | POA: Insufficient documentation

## 2023-10-02 DIAGNOSIS — Z902 Acquired absence of lung [part of]: Secondary | ICD-10-CM | POA: Diagnosis not present

## 2023-10-02 DIAGNOSIS — Z79620 Long term (current) use of immunosuppressive biologic: Secondary | ICD-10-CM | POA: Diagnosis not present

## 2023-10-02 DIAGNOSIS — Z5112 Encounter for antineoplastic immunotherapy: Secondary | ICD-10-CM

## 2023-10-02 LAB — CMP (CANCER CENTER ONLY)
ALT: 31 U/L (ref 0–44)
AST: 46 U/L — ABNORMAL HIGH (ref 15–41)
Albumin: 3.7 g/dL (ref 3.5–5.0)
Alkaline Phosphatase: 96 U/L (ref 38–126)
Anion gap: 6 (ref 5–15)
BUN: 28 mg/dL — ABNORMAL HIGH (ref 8–23)
CO2: 28 mmol/L (ref 22–32)
Calcium: 9.1 mg/dL (ref 8.9–10.3)
Chloride: 107 mmol/L (ref 98–111)
Creatinine: 1.58 mg/dL — ABNORMAL HIGH (ref 0.44–1.00)
GFR, Estimated: 34 mL/min — ABNORMAL LOW (ref >60)
Glucose, Bld: 99 mg/dL (ref 70–99)
Potassium: 4.1 mmol/L (ref 3.5–5.1)
Sodium: 141 mmol/L (ref 135–145)
Total Bilirubin: 0.6 mg/dL (ref 0.0–1.2)
Total Protein: 6.6 g/dL (ref 6.5–8.1)

## 2023-10-02 LAB — CBC WITH DIFFERENTIAL (CANCER CENTER ONLY)
Abs Immature Granulocytes: 0.01 10*3/uL (ref 0.00–0.07)
Basophils Absolute: 0.1 10*3/uL (ref 0.0–0.1)
Basophils Relative: 1 %
Eosinophils Absolute: 3.5 10*3/uL — ABNORMAL HIGH (ref 0.0–0.5)
Eosinophils Relative: 35 %
HCT: 38.1 % (ref 36.0–46.0)
Hemoglobin: 12.1 g/dL (ref 12.0–15.0)
Immature Granulocytes: 0 %
Lymphocytes Relative: 21 %
Lymphs Abs: 2.1 10*3/uL (ref 0.7–4.0)
MCH: 28.9 pg (ref 26.0–34.0)
MCHC: 31.8 g/dL (ref 30.0–36.0)
MCV: 90.9 fL (ref 80.0–100.0)
Monocytes Absolute: 0.7 10*3/uL (ref 0.1–1.0)
Monocytes Relative: 7 %
Neutro Abs: 3.6 10*3/uL (ref 1.7–7.7)
Neutrophils Relative %: 36 %
Platelet Count: 178 10*3/uL (ref 150–400)
RBC: 4.19 MIL/uL (ref 3.87–5.11)
RDW: 13.2 % (ref 11.5–15.5)
WBC Count: 9.9 10*3/uL (ref 4.0–10.5)
nRBC: 0 % (ref 0.0–0.2)

## 2023-10-02 LAB — TSH: TSH: 0.598 u[IU]/mL (ref 0.350–4.500)

## 2023-10-02 MED ORDER — SODIUM CHLORIDE 0.9% FLUSH: 10.0000 mL | INTRAVENOUS | Status: DC | PRN

## 2023-10-02 MED ORDER — SODIUM CHLORIDE 0.9 % IV SOLN: Freq: Once | INTRAVENOUS | Status: AC

## 2023-10-02 MED ORDER — SODIUM CHLORIDE 0.9 % IV SOLN
200.0000 mg | Freq: Once | INTRAVENOUS | Status: AC
  Administered 2023-10-02: 200 mg via INTRAVENOUS
  Filled 2023-10-02: qty 200

## 2023-10-02 MED ORDER — HEPARIN SOD (PORK) LOCK FLUSH 100 UNIT/ML IV SOLN
500.0000 [IU] | Freq: Once | INTRAVENOUS | Status: DC | PRN
Start: 2023-10-02 — End: 2023-10-02

## 2023-10-02 NOTE — Patient Instructions (Signed)

## 2023-10-02 NOTE — Progress Notes (Signed)
 Per patient request, office note from today's visit was routed through epic to PCP, [name], MD.

## 2023-10-03 LAB — T4: T4, Total: 5 ug/dL (ref 4.5–12.0)

## 2023-10-10 ENCOUNTER — Telehealth: Payer: Self-pay | Admitting: Physician Assistant

## 2023-10-10 ENCOUNTER — Ambulatory Visit: Payer: Self-pay | Admitting: Cardiology

## 2023-10-10 NOTE — Telephone Encounter (Signed)
 I informed Brandi Holmes of an update to her schedule on 7/9. I informed Brandi Holmes that we will inquire to have an add on per charge to move her infusion up by 30 minutes.

## 2023-10-15 ENCOUNTER — Other Ambulatory Visit: Payer: Self-pay

## 2023-10-15 ENCOUNTER — Ambulatory Visit (HOSPITAL_COMMUNITY)
Admission: RE | Admit: 2023-10-15 | Discharge: 2023-10-15 | Disposition: A | Discharge status: Home / Self Care | Source: Ambulatory Visit | Attending: Physician Assistant | Admitting: Physician Assistant

## 2023-10-15 ENCOUNTER — Encounter (HOSPITAL_COMMUNITY): Payer: Self-pay

## 2023-10-15 DIAGNOSIS — C3411 Malignant neoplasm of upper lobe, right bronchus or lung: Secondary | ICD-10-CM | POA: Insufficient documentation

## 2023-10-18 NOTE — Progress Notes (Unsigned)
 [facility] OFFICE PROGRESS NOTE  [name], MD [address] [place] [address]  DIAGNOSIS: Metastatic non-small cell lung cancer, adenocarcinoma that was initially diagnosed as stage IIb (T1b, N1, M0) non-small cell lung cancer, adenocarcinoma presented with right upper lobe lung nodule   Detected Alteration(s) / Biomarker(s) Associated FDA-approved therapiesClinical Trial Availability% cfDNA or Amplification BRAF V600E approved by FDA Dabrafenib+trametinib, Encorafenib+binimetinib approved in other indication Cobimetinib, Dabrafenib, Trametinib, Vemurafenib, Vemurafenib+cobimetinib Yes8.3%   BRCA1 R252S None (VUS) None (VUS) 0.1%     SMAD4 D442fs None None 7.2%   PD-L1 expression 35%  PRIOR THERAPY: 1) Status post right upper lobectomy with lymph node dissection on September 30, 2019 in [place] .  She declines adjuvant systemic chemotherapy. 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 Dewey.  CURRENT THERAPY: Systemic chemotherapy with carboplatin  for AUC of 5, Alimta  500 Mg/M2 and Keytruda  200 Mg IV every 3 weeks. First dose April 16, 2022. Status post 26 cycles. Starting cycle #5 she will be on maintenance treatment with Keytruda  every 3 weeks.   INTERVAL HISTORY: Brandi Holmes 73 y.o. female returns to the clinic for a follow-up visit. The patient is feeling fairly today without any concerning complaints.    She is currently undergoing maintenance treatment with immunotherapy with Keytruda . Alimta  ws discontinued due to CKD.  She has no issues with her recent immunotherapy treatment.  No fevers, chills, or night sweats. No new nausea, vomiting, diarrhea, or constipation. She denies rashes or skin changes. She denies changes with her breathing. At baseline she sometimes feels like she has thick phelgm that she has trouble coughing up.  She previously experienced constipation but managed it with medication and dietary changes, including eating watermelon and peaches. She still has some rib pain and back pain from her prior surgery site from 4 years ago. She is prescribed lyrica  and tramadol . She is trying to take her pain medication on a regular basis. She reports she is moving better because she is taking it which is managing her pain and helping her ambulation. She also mentions she saw vascular last year. Yesterday she got a pedicure and had some discolored toes and they massaged her feet which improved her circulation. She recently had a restaging CT scan performed.   She is here for evaluation and repeat blood work before undergoing cycle #27 today.   MEDICAL HISTORY: Past Medical History:  Diagnosis Date   Anemia    as a child only   Coronary artery calcification of native artery    Hyperlipidemia    Hypertension    Liver spots     per pt   Lung nodule    Wears glasses    Wears partial dentures     ALLERGIES:  is allergic to crab [shellfish allergy] and morphine  and codeine.  MEDICATIONS:  Current Outpatient Medications  Medication Sig Dispense Refill   aspirin  EC 81 MG tablet Take 81 mg by mouth at bedtime. Swallow whole.     atorvastatin  (LIPITOR) 20 MG tablet Take 20 mg by mouth at bedtime.     folic acid  (FOLVITE ) 1 MG tablet TAKE ONE TABLET BY MOUTH EVERY DAY 30 tablet 0   furosemide (LASIX) 20 MG tablet Take 20 mg by mouth daily.     lactose free nutrition (BOOST) LIQD Take 237 mLs by mouth 2 (two) times daily between meals.  lactulose (CHRONULAC) 10 GM/15ML solution Take 10 g by mouth 2 (two) times daily.     latanoprost (XALATAN) 0.005 % ophthalmic solution Place 1 drop into both eyes at bedtime.     lisinopril  (ZESTRIL ) 10 MG tablet Take 1 tablet (10 mg total) by mouth every morning. 90 tablet 0   pregabalin  (LYRICA ) 150 MG capsule Take 150 mg by mouth 2 (two) times daily.     prochlorperazine   (COMPAZINE ) 10 MG tablet Take 1 tablet (10 mg total) by mouth every 6 (six) hours as needed for nausea or vomiting. 30 tablet 0   tiZANidine  (ZANAFLEX ) 2 MG tablet Take 2 mg by mouth at bedtime.     traMADol  HCl 100 MG TABS Take 1 tablet by mouth QID. 1 tablet by mouth every 6 hours as needed for pain     No current facility-administered medications for this visit.    SURGICAL HISTORY:  Past Surgical History:  Procedure Laterality Date   ABDOMINAL HYSTERECTOMY  1974   heavy bleeding   ABDOMINAL HYSTERECTOMY     BRONCHIAL BRUSHINGS  08/13/2019   Procedure: BRONCHIAL BRUSHINGS;  Surgeon: Brenna Adine CROME, DO;  Location: [facility];  Service: Pulmonary;;   BRONCHIAL WASHINGS  08/13/2019   Procedure: BRONCHIAL WASHINGS;  Surgeon: Brenna Adine CROME, DO;  Location: [facility];  Service: Pulmonary;;   COLONOSCOPY W/ BIOPSIES AND POLYPECTOMY     DILATION AND CURETTAGE OF UTERUS     FINE NEEDLE ASPIRATION  08/13/2019   Procedure: FINE NEEDLE ASPIRATION (FNA) LINEAR;  Surgeon: Brenna Adine CROME, DO;  Location: [facility];  Service: Pulmonary;;   FOOT SURGERY     bilateral bunions and hammer toes   IR RADIOLOGIST EVAL & MGMT  05/20/2022   LUNG BIOPSY  08/13/2019   Procedure: LUNG BIOPSY;  Surgeon: Brenna Adine CROME, DO;  Location: [facility];  Service: Pulmonary;;   MULTIPLE TOOTH EXTRACTIONS     VIDEO BRONCHOSCOPY WITH ENDOBRONCHIAL NAVIGATION Right 08/13/2019   Procedure: VIDEO BRONCHOSCOPY WITH ENDOBRONCHIAL NAVIGATION;  Surgeon: Brenna Adine CROME, DO;  Location: [facility];  Service: Pulmonary;  Laterality: Right;   VIDEO BRONCHOSCOPY WITH ENDOBRONCHIAL ULTRASOUND Right 08/13/2019   Procedure: VIDEO BRONCHOSCOPY WITH ENDOBRONCHIAL ULTRASOUND;  Surgeon: Brenna Adine CROME, DO;  Location: [facility];  Service: Pulmonary;  Laterality: Right;    REVIEW OF SYSTEMS:   Constitutional: Stable fatigue. Negative for appetite change, chills, fever and unexpected weight change.  HENT: Negative for  mouth sores, nosebleeds, sore throat and trouble swallowing.   Eyes: Negative for eye problems and icterus.  Respiratory: Positive for stable dyspnea on exertion and occasional cough. Negative for hemoptysis and wheezing.  Cardiovascular: Negative for chest pain and leg swelling.  Gastrointestinal: Negative for abdominal pain, constipation, diarrhea, nausea and vomiting.  Genitourinary: Negative for bladder incontinence, difficulty urinating, dysuria, frequency and hematuria.   Musculoskeletal: Positive for chronic back and right rib pain. Negative for back pain, gait problem, neck pain and neck stiffness.  Skin: Negative for itching and rash.  Neurological: Positive for gait problem secondary to back pain. Negative for dizziness, extremity weakness, gait problem, headaches, light-headedness and seizures.  Hematological: Negative for adenopathy. Does not bruise/bleed easily.  Psychiatric/Behavioral: Negative for confusion, depression and sleep disturbance. The patient is not nervous/anxious.    PHYSICAL EXAMINATION:  Blood pressure 121/71, pulse 72, temperature 98.1 F (36.7 C), temperature source Oral, resp. rate 17, height 5' 6 (1.676 m), weight 162 lb 14.4 oz (73.9 kg), SpO2 98%.  ECOG PERFORMANCE STATUS: 1-2  Physical Exam  Constitutional: Oriented to person, place, and time and well-developed, well-nourished, and in no distress.   HENT:  Head: Normocephalic and atraumatic.  Mouth/Throat: Oropharynx is clear and moist. No oropharyngeal exudate.  Eyes: Conjunctivae are normal. Right eye exhibits no discharge. Left eye exhibits no discharge. No scleral icterus.  Neck: Normal range of motion. Neck supple.  Cardiovascular: Normal rate, regular rhythm, normal heart sounds and intact distal pulses.   Pulmonary/Chest: Effort normal and breath sounds normal. No respiratory distress. No wheezes. No rales.  Abdominal: Soft. Bowel sounds are normal. Exhibits no distension and no mass. There is  no tenderness.  Musculoskeletal: Tenderness to palpation of upper back. Normal range of motion. Exhibits muscle wasting.  Lymphadenopathy:    No cervical adenopathy.  Neurological: Alert and oriented to person, place, and time. Exhibits muscle wasting. Ambulates stooped over due to back pain. Skin: Skin is warm and dry. No rash noted. Not diaphoretic. No erythema. No pallor.  Psychiatric: Mood, memory and judgment normal.  Vitals reviewed.    LABORATORY DATA: Lab Results  Component Value Date   WBC 10.1 10/22/2023   HGB 12.3 10/22/2023   HCT 37.9 10/22/2023   MCV 89.6 10/22/2023   PLT 182 10/22/2023      Chemistry      Component Value Date/Time   NA 141 10/02/2023 1413   NA 142 07/22/2019 1522   K 4.1 10/02/2023 1413   CL 107 10/02/2023 1413   CO2 28 10/02/2023 1413   BUN 28 (H) 10/02/2023 1413   BUN 13 07/22/2019 1522   CREATININE 1.58 (H) 10/02/2023 1413   CREATININE 0.91 09/16/2011 1000      Component Value Date/Time   CALCIUM  9.1 10/02/2023 1413   ALKPHOS 96 10/02/2023 1413   AST 46 (H) 10/02/2023 1413   ALT 31 10/02/2023 1413   BILITOT 0.6 10/02/2023 1413       RADIOGRAPHIC STUDIES:  CT CHEST ABDOMEN PELVIS WO CONTRAST Result Date: 10/16/2023 CLINICAL DATA:  Metastatic lung cancer restaging, ongoing chemotherapy * Tracking Code: BO * EXAM: CT CHEST, ABDOMEN AND PELVIS WITHOUT CONTRAST TECHNIQUE: Multidetector CT imaging of the chest, abdomen and pelvis was performed following the standard protocol without IV contrast. RADIATION DOSE REDUCTION: This exam was performed according to the departmental dose-optimization program which includes automated exposure control, adjustment of the mA and/or kV according to patient size and/or use of iterative reconstruction technique. COMPARISON:  07/24/2023 FINDINGS: CT CHEST FINDINGS Cardiovascular: Aortic atherosclerosis. Normal heart size. Three-vessel coronary artery calcifications. Unchanged small pericardial effusion.  Mediastinum/Nodes: No enlarged mediastinal, hilar, or axillary lymph nodes. Thyroid  gland, trachea, and esophagus demonstrate no significant findings. Lungs/Pleura: Unchanged postoperative/post treatment appearance of the right hemithorax status post right upper lobectomy with fibrotic scarring and volume loss of the right lung base and a small, loculated right pleural effusion (series 6, image 104). Unchanged trace left pleural effusion. Musculoskeletal: No chest wall abnormality. No acute osseous findings. CT ABDOMEN PELVIS FINDINGS Hepatobiliary: Multiple unchanged low-attenuation liver lesions, not confidently characterized by noncontrast CT although presumably small cysts or hemangiomata (series 2, image 48). No gallstones, gallbladder wall thickening, or biliary dilatation. Pancreas: Unremarkable. No pancreatic ductal dilatation or surrounding inflammatory changes. Spleen: Normal in size without significant abnormality. Adrenals/Urinary Tract: Adrenal glands are unremarkable. Kidneys are normal, without renal calculi, solid lesion, or hydronephrosis. Bladder is unremarkable. Stomach/Bowel: Stomach is within normal limits. Appendix not clearly visualized. No evidence of bowel wall thickening, distention, or inflammatory changes. Vascular/Lymphatic: Aortic atherosclerosis. No enlarged abdominal  or pelvic lymph nodes. Reproductive: Status post hysterectomy. Other: No abdominal wall hernia or abnormality. No ascites. Musculoskeletal: No acute osseous findings. Multiple unchanged sclerotic vertebral body metastases, notable for mild, chronic pathologic wedge deformities of T7 through T11 (series 10, image 107). IMPRESSION: 1. Unchanged postoperative/post treatment appearance of the right hemithorax status post right upper lobectomy with fibrotic scarring and volume loss of the right lung base and a small, loculated right pleural effusion. 2. Multiple unchanged sclerotic vertebral body metastases, notable for mild,  chronic pathologic wedge deformities of T7 through T11. 3. No evidence of lymphadenopathy or soft tissue metastatic disease in the chest, abdomen, or pelvis by noncontrast CT. 4. Coronary artery disease. Aortic Atherosclerosis (ICD10-I70.0). Electronically Signed   By: Marolyn JONETTA Jaksch M.D.   On: 10/16/2023 09:39     ASSESSMENT/PLAN:  This is a very pleasant 73 year old African-American female diagnosed with metastatic non-small cell lung cancer, adenocarcinoma.  She was initially diagnosed as a stage IIb (T1b, N1, M0).  She is status post right upper lobectomy with lymph node dissection on September 30, 2019 in [place] .  She declined adjuvant systemic chemotherapy at that time.   She is found to have metastatic disease to the bone in November 2023.   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   She underwent palliative radiation to T9 under the care of Dr. Dewey.   She is currently on systemic therapy.  She was started on systemic chemotherapy with carboplatin  for an AUC of 5, Alimta  500 mg/m, and Keytruda  200 mg IV every 3 weeks.  She is status post 26 cycles.  Starting from cycle #5, she started maintenance treatment with immunotherapy with Keytruda  only.  Alimta  was discontinued secondary to renal insufficiency.   She recently had a restaging CT scan that is stable without disease progression.   Labs were reviewed. Recommend that she proceed with cycle #27 today as schedule. She is ok to treat with a creatinine of  1.44   We will see her back for follow-up visit in 3 weeks for evaluation repeat blood work before undergoing cycle #28.   She will continue lyrica  and tramadol  for her chronic back pain. She also uses heating pad.   She will follow up with vascular if she continues to have any concerns related to circulation. This improved after a massage yesterday. She is not able to tolerate compression stockings.    The patient was advised to call  immediately if she has any concerning symptoms in the interval. The patient voices understanding of current disease status and treatment options and is in agreement with the current care plan. All questions were answered. The patient knows to call the clinic with any problems, questions or concerns. We can certainly see the patient much sooner if necessary      No orders of the defined types were placed in this encounter.    The total time spent in the appointment was 20-29 minutes  Merrit Waugh L Keyaira Clapham, PA-C 10/22/23

## 2023-10-22 ENCOUNTER — Other Ambulatory Visit

## 2023-10-22 ENCOUNTER — Ambulatory Visit: Admitting: Physician Assistant

## 2023-10-22 ENCOUNTER — Inpatient Hospital Stay: Attending: Physician Assistant

## 2023-10-22 ENCOUNTER — Ambulatory Visit

## 2023-10-22 ENCOUNTER — Inpatient Hospital Stay (HOSPITAL_BASED_OUTPATIENT_CLINIC_OR_DEPARTMENT_OTHER): Admitting: Physician Assistant

## 2023-10-22 ENCOUNTER — Inpatient Hospital Stay

## 2023-10-22 VITALS — BP 121/71 | HR 72 | Temp 98.1°F | Resp 17 | Ht 66.0 in | Wt 162.9 lb

## 2023-10-22 DIAGNOSIS — Z902 Acquired absence of lung [part of]: Secondary | ICD-10-CM | POA: Insufficient documentation

## 2023-10-22 DIAGNOSIS — Z5112 Encounter for antineoplastic immunotherapy: Secondary | ICD-10-CM | POA: Insufficient documentation

## 2023-10-22 DIAGNOSIS — Z923 Personal history of irradiation: Secondary | ICD-10-CM | POA: Insufficient documentation

## 2023-10-22 DIAGNOSIS — C7951 Secondary malignant neoplasm of bone: Secondary | ICD-10-CM | POA: Diagnosis not present

## 2023-10-22 DIAGNOSIS — C3411 Malignant neoplasm of upper lobe, right bronchus or lung: Secondary | ICD-10-CM

## 2023-10-22 LAB — CMP (CANCER CENTER ONLY)
ALT: 24 U/L (ref 0–44)
AST: 39 U/L (ref 15–41)
Albumin: 3.6 g/dL (ref 3.5–5.0)
Alkaline Phosphatase: 86 U/L (ref 38–126)
Anion gap: 8 (ref 5–15)
BUN: 26 mg/dL — ABNORMAL HIGH (ref 8–23)
CO2: 26 mmol/L (ref 22–32)
Calcium: 9.1 mg/dL (ref 8.9–10.3)
Chloride: 109 mmol/L (ref 98–111)
Creatinine: 1.44 mg/dL — ABNORMAL HIGH (ref 0.44–1.00)
GFR, Estimated: 38 mL/min — ABNORMAL LOW (ref >60)
Glucose, Bld: 90 mg/dL (ref 70–99)
Potassium: 3.8 mmol/L (ref 3.5–5.1)
Sodium: 143 mmol/L (ref 135–145)
Total Bilirubin: 0.8 mg/dL (ref 0.0–1.2)
Total Protein: 6.5 g/dL (ref 6.5–8.1)

## 2023-10-22 LAB — CBC WITH DIFFERENTIAL (CANCER CENTER ONLY)
Abs Immature Granulocytes: 0.01 K/uL (ref 0.00–0.07)
Basophils Absolute: 0.1 K/uL (ref 0.0–0.1)
Basophils Relative: 1 %
Eosinophils Absolute: 2.7 K/uL — ABNORMAL HIGH (ref 0.0–0.5)
Eosinophils Relative: 27 %
HCT: 37.9 % (ref 36.0–46.0)
Hemoglobin: 12.3 g/dL (ref 12.0–15.0)
Immature Granulocytes: 0 %
Lymphocytes Relative: 31 %
Lymphs Abs: 3.2 K/uL (ref 0.7–4.0)
MCH: 29.1 pg (ref 26.0–34.0)
MCHC: 32.5 g/dL (ref 30.0–36.0)
MCV: 89.6 fL (ref 80.0–100.0)
Monocytes Absolute: 0.8 K/uL (ref 0.1–1.0)
Monocytes Relative: 8 %
Neutro Abs: 3.4 K/uL (ref 1.7–7.7)
Neutrophils Relative %: 33 %
Platelet Count: 182 K/uL (ref 150–400)
RBC: 4.23 MIL/uL (ref 3.87–5.11)
RDW: 13 % (ref 11.5–15.5)
WBC Count: 10.1 K/uL (ref 4.0–10.5)
nRBC: 0 % (ref 0.0–0.2)

## 2023-10-22 MED ORDER — SODIUM CHLORIDE 0.9 % IV SOLN
200.0000 mg | Freq: Once | INTRAVENOUS | Status: AC
  Administered 2023-10-22: 200 mg via INTRAVENOUS
  Filled 2023-10-22: qty 200

## 2023-10-22 MED ORDER — SODIUM CHLORIDE 0.9 % IV SOLN
Freq: Once | INTRAVENOUS | Status: AC
Start: 2023-10-22 — End: 2023-10-22

## 2023-10-22 NOTE — Patient Instructions (Signed)
 [facility] - A DEPT OF [facility]  Discharge Instructions: Thank you for choosing [facility] to provide your oncology and hematology care.   If you have a lab appointment with the Cancer Center, please go directly to the Cancer Center and check in at the registration area.   Wear comfortable clothing and clothing appropriate for easy access to any Portacath or PICC line.   We strive to give you quality time with your provider. You may need to reschedule your appointment if you arrive late (15 or more minutes).  Arriving late affects you and other patients whose appointments are after yours.  Also, if you miss three or more appointments without notifying the office, you may be dismissed from the clinic at the provider's discretion.      For prescription refill requests, have your pharmacy contact our office and allow 72 hours for refills to be completed.    Today you received the following chemotherapy and/or immunotherapy agents :  Pembrolizumab       To help prevent nausea and vomiting after your treatment, we encourage you to take your nausea medication as directed.  BELOW ARE SYMPTOMS THAT SHOULD BE REPORTED IMMEDIATELY: *FEVER GREATER THAN 100.4 F (38 C) OR HIGHER *CHILLS OR SWEATING *NAUSEA AND VOMITING THAT IS NOT CONTROLLED WITH YOUR NAUSEA MEDICATION *UNUSUAL SHORTNESS OF BREATH *UNUSUAL BRUISING OR BLEEDING *URINARY PROBLEMS (pain or burning when urinating, or frequent urination) *BOWEL PROBLEMS (unusual diarrhea, constipation, pain near the anus) TENDERNESS IN MOUTH AND THROAT WITH OR WITHOUT PRESENCE OF ULCERS (sore throat, sores in mouth, or a toothache) UNUSUAL RASH, SWELLING OR PAIN  UNUSUAL VAGINAL DISCHARGE OR ITCHING   Items with * indicate a potential emergency and should be followed up as soon as possible or go to the Emergency Department if any problems should occur.  Please show the CHEMOTHERAPY ALERT CARD or  IMMUNOTHERAPY ALERT CARD at check-in to the Emergency Department and triage nurse.  Should you have questions after your visit or need to cancel or reschedule your appointment, please contact [facility] - A DEPT [facility]  Dept: [phone]  and follow the prompts.  Office hours are 8:00 a.m. to 4:30 p.m. Monday - Friday. Please note that voicemails left after 4:00 p.m. may not be returned until the following business day.  We are closed weekends and major holidays. You have access to a nurse at all times for urgent questions. Please call the main number to the clinic Dept: [phone] and follow the prompts.   For any non-urgent questions, you may also contact your provider using MyChart. We now offer e-Visits for anyone 25 and older to request care online for non-urgent symptoms. For details visit [email or web address].   Also download the MyChart app! Go to the app store, search "MyChart", open the app, select [facility], and log in with your MyChart username and password.

## 2023-10-25 ENCOUNTER — Other Ambulatory Visit: Payer: Self-pay

## 2023-10-26 ENCOUNTER — Other Ambulatory Visit: Payer: Self-pay

## 2023-10-28 ENCOUNTER — Other Ambulatory Visit: Payer: Self-pay

## 2023-11-10 NOTE — Progress Notes (Unsigned)
 [facility] OFFICE PROGRESS NOTE  Brandi Crigler, MD [address] [place] [address]  DIAGNOSIS: Metastatic non-small cell lung cancer, adenocarcinoma that was initially diagnosed as stage IIb (T1b, N1, M0) non-small cell lung cancer, adenocarcinoma presented with right upper lobe lung nodule   Detected Alteration(s) / Biomarker(s) Associated FDA-approved therapiesClinical Trial Availability% cfDNA or Amplification BRAF V600E approved by FDA Dabrafenib+trametinib, Encorafenib+binimetinib approved in other indication Cobimetinib, Dabrafenib, Trametinib, Vemurafenib, Vemurafenib+cobimetinib Yes8.3%   BRCA1 R252S None (VUS) None (VUS) 0.1%     SMAD4 D463fs None None 7.2%   PD-L1 expression 35%  PRIOR THERAPY: 1) Status post right upper lobectomy with lymph node dissection on September 30, 2019 in [place] .  She declines adjuvant systemic chemotherapy. 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 Dewey.  CURRENT THERAPY: Systemic chemotherapy with carboplatin  for AUC of 5, Alimta  500 Mg/M2 and Keytruda  200 Mg IV every 3 weeks. First dose April 16, 2022. Status post 27 cycles. Starting cycle #5 she will be on maintenance treatment with Keytruda  every 3 weeks.   INTERVAL HISTORY: Brandi Holmes 73 y.o. female returns to the clinic for a follow-up visit. The patient is feeling fairly today without any concerning complaints.   She is currently undergoing maintenance treatment with immunotherapy with Keytruda . Alimta  ws discontinued due to CKD.  She has no issues with her recent immunotherapy treatment.   No fevers, chills, or night sweats. No new nausea, vomiting, diarrhea, or constipation. She denies rashes or skin changes. She denies changes with her breathing. She previously experienced constipation but managed it with medication and dietary changes  and she is no longer having trouble with this. She is prescribed lyrica  and tramadol  for chronic back pain since her surgery several years ago. She is here for evaluation and repeat blood work before undergoing cycle #28   MEDICAL HISTORY: Past Medical History:  Diagnosis Date   Anemia    as a child only   Coronary artery calcification of native artery    Hyperlipidemia    Hypertension    Liver spots     per pt   Lung nodule    Wears glasses    Wears partial dentures     ALLERGIES:  is allergic to crab [shellfish allergy] and morphine  and codeine.  MEDICATIONS:  Current Outpatient Medications  Medication Sig Dispense Refill   aspirin  EC 81 MG tablet Take 81 mg by mouth at bedtime. Swallow whole.     atorvastatin  (LIPITOR) 20 MG tablet Take 20 mg by mouth at bedtime.     folic acid  (FOLVITE ) 1 MG tablet TAKE ONE TABLET BY MOUTH EVERY DAY 30 tablet 0   furosemide (LASIX) 20 MG tablet Take 20 mg by mouth daily.     lactose free nutrition (BOOST) LIQD Take 237 mLs by mouth 2 (two) times daily between meals.     lactulose (CHRONULAC) 10 GM/15ML solution Take 10 g by mouth 2 (two) times daily.     latanoprost (XALATAN) 0.005 % ophthalmic solution Place 1 drop into both eyes at bedtime.     lisinopril  (ZESTRIL ) 10 MG tablet Take 1 tablet (10 mg total) by mouth every morning. 90 tablet 0   pregabalin  (LYRICA ) 150 MG capsule Take 150 mg by mouth 2 (two) times daily.     prochlorperazine  (COMPAZINE ) 10 MG tablet Take 1 tablet (10 mg total) by mouth every  6 (six) hours as needed for nausea or vomiting. 30 tablet 0   tiZANidine  (ZANAFLEX ) 2 MG tablet Take 2 mg by mouth at bedtime.     traMADol  HCl 100 MG TABS Take 1 tablet by mouth QID. 1 tablet by mouth every 6 hours as needed for pain     No current facility-administered medications for this visit.    SURGICAL HISTORY:  Past Surgical History:  Procedure Laterality Date   ABDOMINAL HYSTERECTOMY  1974   heavy bleeding   ABDOMINAL  HYSTERECTOMY     BRONCHIAL BRUSHINGS  08/13/2019   Procedure: BRONCHIAL BRUSHINGS;  Surgeon: Brenna Adine CROME, DO;  Location: [facility];  Service: Pulmonary;;   BRONCHIAL WASHINGS  08/13/2019   Procedure: BRONCHIAL WASHINGS;  Surgeon: Brenna Adine CROME, DO;  Location: [facility];  Service: Pulmonary;;   COLONOSCOPY W/ BIOPSIES AND POLYPECTOMY     DILATION AND CURETTAGE OF UTERUS     FINE NEEDLE ASPIRATION  08/13/2019   Procedure: FINE NEEDLE ASPIRATION (FNA) LINEAR;  Surgeon: Brenna Adine CROME, DO;  Location: [facility];  Service: Pulmonary;;   FOOT SURGERY     bilateral bunions and hammer toes   IR RADIOLOGIST EVAL & MGMT  05/20/2022   LUNG BIOPSY  08/13/2019   Procedure: LUNG BIOPSY;  Surgeon: Brenna Adine CROME, DO;  Location: [facility];  Service: Pulmonary;;   MULTIPLE TOOTH EXTRACTIONS     VIDEO BRONCHOSCOPY WITH ENDOBRONCHIAL NAVIGATION Right 08/13/2019   Procedure: VIDEO BRONCHOSCOPY WITH ENDOBRONCHIAL NAVIGATION;  Surgeon: Brenna Adine CROME, DO;  Location: [facility];  Service: Pulmonary;  Laterality: Right;   VIDEO BRONCHOSCOPY WITH ENDOBRONCHIAL ULTRASOUND Right 08/13/2019   Procedure: VIDEO BRONCHOSCOPY WITH ENDOBRONCHIAL ULTRASOUND;  Surgeon: Brenna Adine CROME, DO;  Location: [facility];  Service: Pulmonary;  Laterality: Right;    REVIEW OF SYSTEMS:   Review of Systems  Constitutional: Stable fatigue. Negative for appetite change, chills, fever and unexpected weight change.  HENT: Negative for mouth sores, nosebleeds, sore throat and trouble swallowing.   Eyes: Negative for eye problems and icterus.  Respiratory: Positive for stable dyspnea on exertion and occasional cough. Negative for hemoptysis and wheezing.  Cardiovascular: Negative for chest pain and leg swelling.  Gastrointestinal: Negative for abdominal pain, constipation, diarrhea, nausea and vomiting.  Genitourinary: Negative for bladder incontinence, difficulty urinating, dysuria, frequency and hematuria.    Musculoskeletal: Positive for chronic back and right rib pain. Negative for back pain, gait problem, neck pain and neck stiffness.  Skin: Negative for itching and rash.  Neurological: Positive for gait problem secondary to back pain. Negative for dizziness, extremity weakness, gait problem, headaches, light-headedness and seizures.  Hematological: Negative for adenopathy. Does not bruise/bleed easily.  Psychiatric/Behavioral: Negative for confusion, depression and sleep disturbance. The patient is not nervous/anxious.     PHYSICAL EXAMINATION:  There were no vitals taken for this visit.  ECOG PERFORMANCE STATUS: 1-2  Physical Exam  Constitutional: Oriented to person, place, and time and well-developed, well-nourished, and in no distress.   HENT:  Head: Normocephalic and atraumatic.  Mouth/Throat: Oropharynx is clear and moist. No oropharyngeal exudate.  Eyes: Conjunctivae are normal. Right eye exhibits no discharge. Left eye exhibits no discharge. No scleral icterus.  Neck: Normal range of motion. Neck supple.  Cardiovascular: Normal rate, regular rhythm, normal heart sounds and intact distal pulses.   Pulmonary/Chest: Effort normal and breath sounds normal. No respiratory distress. No wheezes. No rales.  Abdominal: Soft. Bowel sounds are normal. Exhibits no distension and no mass. There is  no tenderness.  Musculoskeletal: Tenderness to palpation of upper back. Normal range of motion. Exhibits muscle wasting.  Lymphadenopathy:    No cervical adenopathy.  Neurological: Alert and oriented to person, place, and time. Exhibits muscle wasting. Ambulates stooped over due to back pain. Skin: Skin is warm and dry. No rash noted. Not diaphoretic. No erythema. No pallor.  Psychiatric: Mood, memory and judgment normal.  Vitals reviewed.  LABORATORY DATA: Lab Results  Component Value Date   WBC 10.1 10/22/2023   HGB 12.3 10/22/2023   HCT 37.9 10/22/2023   MCV 89.6 10/22/2023   PLT 182  10/22/2023      Chemistry      Component Value Date/Time   NA 143 10/22/2023 1009   NA 142 07/22/2019 1522   K 3.8 10/22/2023 1009   CL 109 10/22/2023 1009   CO2 26 10/22/2023 1009   BUN 26 (H) 10/22/2023 1009   BUN 13 07/22/2019 1522   CREATININE 1.44 (H) 10/22/2023 1009   CREATININE 0.91 09/16/2011 1000      Component Value Date/Time   CALCIUM  9.1 10/22/2023 1009   ALKPHOS 86 10/22/2023 1009   AST 39 10/22/2023 1009   ALT 24 10/22/2023 1009   BILITOT 0.8 10/22/2023 1009       RADIOGRAPHIC STUDIES:  CT CHEST ABDOMEN PELVIS WO CONTRAST Result Date: 10/16/2023 CLINICAL DATA:  Metastatic lung cancer restaging, ongoing chemotherapy * Tracking Code: BO * EXAM: CT CHEST, ABDOMEN AND PELVIS WITHOUT CONTRAST TECHNIQUE: Multidetector CT imaging of the chest, abdomen and pelvis was performed following the standard protocol without IV contrast. RADIATION DOSE REDUCTION: This exam was performed according to the departmental dose-optimization program which includes automated exposure control, adjustment of the mA and/or kV according to patient size and/or use of iterative reconstruction technique. COMPARISON:  07/24/2023 FINDINGS: CT CHEST FINDINGS Cardiovascular: Aortic atherosclerosis. Normal heart size. Three-vessel coronary artery calcifications. Unchanged small pericardial effusion. Mediastinum/Nodes: No enlarged mediastinal, hilar, or axillary lymph nodes. Thyroid  gland, trachea, and esophagus demonstrate no significant findings. Lungs/Pleura: Unchanged postoperative/post treatment appearance of the right hemithorax status post right upper lobectomy with fibrotic scarring and volume loss of the right lung base and a small, loculated right pleural effusion (series 6, image 104). Unchanged trace left pleural effusion. Musculoskeletal: No chest wall abnormality. No acute osseous findings. CT ABDOMEN PELVIS FINDINGS Hepatobiliary: Multiple unchanged low-attenuation liver lesions, not confidently  characterized by noncontrast CT although presumably small cysts or hemangiomata (series 2, image 48). No gallstones, gallbladder wall thickening, or biliary dilatation. Pancreas: Unremarkable. No pancreatic ductal dilatation or surrounding inflammatory changes. Spleen: Normal in size without significant abnormality. Adrenals/Urinary Tract: Adrenal glands are unremarkable. Kidneys are normal, without renal calculi, solid lesion, or hydronephrosis. Bladder is unremarkable. Stomach/Bowel: Stomach is within normal limits. Appendix not clearly visualized. No evidence of bowel wall thickening, distention, or inflammatory changes. Vascular/Lymphatic: Aortic atherosclerosis. No enlarged abdominal or pelvic lymph nodes. Reproductive: Status post hysterectomy. Other: No abdominal wall hernia or abnormality. No ascites. Musculoskeletal: No acute osseous findings. Multiple unchanged sclerotic vertebral body metastases, notable for mild, chronic pathologic wedge deformities of T7 through T11 (series 10, image 107). IMPRESSION: 1. Unchanged postoperative/post treatment appearance of the right hemithorax status post right upper lobectomy with fibrotic scarring and volume loss of the right lung base and a small, loculated right pleural effusion. 2. Multiple unchanged sclerotic vertebral body metastases, notable for mild, chronic pathologic wedge deformities of T7 through T11. 3. No evidence of lymphadenopathy or soft tissue metastatic disease in the chest, abdomen,  or pelvis by noncontrast CT. 4. Coronary artery disease. Aortic Atherosclerosis (ICD10-I70.0). Electronically Signed   By: Marolyn JONETTA Jaksch M.D.   On: 10/16/2023 09:39     ASSESSMENT/PLAN:  This is a very pleasant 73 year old African-American female diagnosed with metastatic non-small cell lung cancer, adenocarcinoma.  She was initially diagnosed as a stage IIb (T1b, N1, M0).  She is status post right upper lobectomy with lymph node dissection on September 30, 2019 in  [place] .  She declined adjuvant systemic chemotherapy at that time.   She is found to have metastatic disease to the bone in November 2023.   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   She underwent palliative radiation to T9 under the care of Dr. Dewey.   She is currently on systemic therapy.  She was started on systemic chemotherapy with carboplatin  for an AUC of 5, Alimta  500 mg/m, and Keytruda  200 mg IV every 3 weeks.  She is status post 27 cycles.  Starting from cycle #5, she started maintenance treatment with immunotherapy with Keytruda  only.  Alimta  was discontinued secondary to renal insufficiency.    Labs were reviewed. Recommend that she proceed with cycle #28 today as schedule. She is ok to treat with a creatinine of  1.47   We will see her back for follow-up visit in 3 weeks for evaluation repeat blood work before undergoing cycle #29.   She will continue lyrica  and tramadol  for her chronic back pain. She also uses heating pad.     The patient was advised to call immediately if she has any concerning symptoms in the interval. The patient voices understanding of current disease status and treatment options and is in agreement with the current care plan. All questions were answered. The patient knows to call the clinic with any problems, questions or concerns. We can certainly see the patient much sooner if necessary     No orders of the defined types were placed in this encounter.   The total time spent in the appointment was 20-29 minutes  Rudi Knippenberg L Roshawn Lacina, PA-C 11/10/23

## 2023-11-13 ENCOUNTER — Other Ambulatory Visit: Payer: Self-pay

## 2023-11-13 ENCOUNTER — Inpatient Hospital Stay (HOSPITAL_BASED_OUTPATIENT_CLINIC_OR_DEPARTMENT_OTHER): Admitting: Physician Assistant

## 2023-11-13 ENCOUNTER — Other Ambulatory Visit

## 2023-11-13 ENCOUNTER — Other Ambulatory Visit: Payer: Self-pay | Admitting: Physician Assistant

## 2023-11-13 ENCOUNTER — Inpatient Hospital Stay

## 2023-11-13 ENCOUNTER — Ambulatory Visit

## 2023-11-13 ENCOUNTER — Ambulatory Visit: Admitting: Internal Medicine

## 2023-11-13 VITALS — BP 129/72 | HR 64 | Temp 98.2°F | Resp 18 | Ht 66.0 in | Wt 162.0 lb

## 2023-11-13 DIAGNOSIS — C7951 Secondary malignant neoplasm of bone: Secondary | ICD-10-CM

## 2023-11-13 DIAGNOSIS — Z5112 Encounter for antineoplastic immunotherapy: Secondary | ICD-10-CM | POA: Diagnosis not present

## 2023-11-13 DIAGNOSIS — C3411 Malignant neoplasm of upper lobe, right bronchus or lung: Secondary | ICD-10-CM | POA: Diagnosis not present

## 2023-11-13 LAB — CMP (CANCER CENTER ONLY)
ALT: 15 U/L (ref 0–44)
AST: 22 U/L (ref 15–41)
Albumin: 3.8 g/dL (ref 3.5–5.0)
Alkaline Phosphatase: 77 U/L (ref 38–126)
Anion gap: 6 (ref 5–15)
BUN: 30 mg/dL — ABNORMAL HIGH (ref 8–23)
CO2: 28 mmol/L (ref 22–32)
Calcium: 9.2 mg/dL (ref 8.9–10.3)
Chloride: 107 mmol/L (ref 98–111)
Creatinine: 1.47 mg/dL — ABNORMAL HIGH (ref 0.44–1.00)
GFR, Estimated: 37 mL/min — ABNORMAL LOW (ref >60)
Glucose, Bld: 71 mg/dL (ref 70–99)
Potassium: 3.9 mmol/L (ref 3.5–5.1)
Sodium: 141 mmol/L (ref 135–145)
Total Bilirubin: 0.7 mg/dL (ref 0.0–1.2)
Total Protein: 7.1 g/dL (ref 6.5–8.1)

## 2023-11-13 LAB — CBC WITH DIFFERENTIAL (CANCER CENTER ONLY)
Abs Immature Granulocytes: 0.01 K/uL (ref 0.00–0.07)
Basophils Absolute: 0.1 K/uL (ref 0.0–0.1)
Basophils Relative: 1 %
Eosinophils Absolute: 1.8 K/uL — ABNORMAL HIGH (ref 0.0–0.5)
Eosinophils Relative: 21 %
HCT: 40.9 % (ref 36.0–46.0)
Hemoglobin: 13.1 g/dL (ref 12.0–15.0)
Immature Granulocytes: 0 %
Lymphocytes Relative: 29 %
Lymphs Abs: 2.4 K/uL (ref 0.7–4.0)
MCH: 29.4 pg (ref 26.0–34.0)
MCHC: 32 g/dL (ref 30.0–36.0)
MCV: 91.7 fL (ref 80.0–100.0)
Monocytes Absolute: 0.7 K/uL (ref 0.1–1.0)
Monocytes Relative: 8 %
Neutro Abs: 3.3 K/uL (ref 1.7–7.7)
Neutrophils Relative %: 41 %
Platelet Count: 194 K/uL (ref 150–400)
RBC: 4.46 MIL/uL (ref 3.87–5.11)
RDW: 13.2 % (ref 11.5–15.5)
WBC Count: 8.3 K/uL (ref 4.0–10.5)
nRBC: 0 % (ref 0.0–0.2)

## 2023-11-13 MED ORDER — SODIUM CHLORIDE 0.9% FLUSH: 10.0000 mL | INTRAVENOUS | Status: DC | PRN

## 2023-11-13 MED ORDER — SODIUM CHLORIDE 0.9 % IV SOLN
200.0000 mg | Freq: Once | INTRAVENOUS | Status: AC
  Administered 2023-11-13: 200 mg via INTRAVENOUS
  Filled 2023-11-13: qty 200

## 2023-11-13 MED ORDER — HEPARIN SOD (PORK) LOCK FLUSH 100 UNIT/ML IV SOLN
500.0000 [IU] | Freq: Once | INTRAVENOUS | Status: DC | PRN
Start: 2023-11-13 — End: 2023-11-13

## 2023-11-13 MED ORDER — SODIUM CHLORIDE 0.9 % IV SOLN
Freq: Once | INTRAVENOUS | Status: AC
Start: 2023-11-13 — End: 2023-11-13

## 2023-11-13 NOTE — Patient Instructions (Signed)
 [facility] - A DEPT OF [facility]  Discharge Instructions: Thank you for choosing [facility] to provide your oncology and hematology care.   If you have a lab appointment with the Cancer Center, please go directly to the Cancer Center and check in at the registration area.   Wear comfortable clothing and clothing appropriate for easy access to any Portacath or PICC line.   We strive to give you quality time with your provider. You may need to reschedule your appointment if you arrive late (15 or more minutes).  Arriving late affects you and other patients whose appointments are after yours.  Also, if you miss three or more appointments without notifying the office, you may be dismissed from the clinic at the provider's discretion.      For prescription refill requests, have your pharmacy contact our office and allow 72 hours for refills to be completed.    Today you received the following chemotherapy and/or immunotherapy agents :  Pembrolizumab       To help prevent nausea and vomiting after your treatment, we encourage you to take your nausea medication as directed.  BELOW ARE SYMPTOMS THAT SHOULD BE REPORTED IMMEDIATELY: *FEVER GREATER THAN 100.4 F (38 C) OR HIGHER *CHILLS OR SWEATING *NAUSEA AND VOMITING THAT IS NOT CONTROLLED WITH YOUR NAUSEA MEDICATION *UNUSUAL SHORTNESS OF BREATH *UNUSUAL BRUISING OR BLEEDING *URINARY PROBLEMS (pain or burning when urinating, or frequent urination) *BOWEL PROBLEMS (unusual diarrhea, constipation, pain near the anus) TENDERNESS IN MOUTH AND THROAT WITH OR WITHOUT PRESENCE OF ULCERS (sore throat, sores in mouth, or a toothache) UNUSUAL RASH, SWELLING OR PAIN  UNUSUAL VAGINAL DISCHARGE OR ITCHING   Items with * indicate a potential emergency and should be followed up as soon as possible or go to the Emergency Department if any problems should occur.  Please show the CHEMOTHERAPY ALERT CARD or  IMMUNOTHERAPY ALERT CARD at check-in to the Emergency Department and triage nurse.  Should you have questions after your visit or need to cancel or reschedule your appointment, please contact [facility] - A DEPT [facility]  Dept: [phone]  and follow the prompts.  Office hours are 8:00 a.m. to 4:30 p.m. Monday - Friday. Please note that voicemails left after 4:00 p.m. may not be returned until the following business day.  We are closed weekends and major holidays. You have access to a nurse at all times for urgent questions. Please call the main number to the clinic Dept: [phone] and follow the prompts.   For any non-urgent questions, you may also contact your provider using MyChart. We now offer e-Visits for anyone 25 and older to request care online for non-urgent symptoms. For details visit [email or web address].   Also download the MyChart app! Go to the app store, search "MyChart", open the app, select [facility], and log in with your MyChart username and password.

## 2023-11-27 ENCOUNTER — Other Ambulatory Visit: Payer: Self-pay

## 2023-12-01 NOTE — Progress Notes (Unsigned)
 [facility] OFFICE PROGRESS NOTE  Brandi Crigler, MD [address] [place] [address]  DIAGNOSIS: Metastatic non-small cell lung cancer, adenocarcinoma that was initially diagnosed as stage IIb (T1b, N1, M0) non-small cell lung cancer, adenocarcinoma presented with right upper lobe lung nodule   Detected Alteration(s) / Biomarker(s) Associated FDA-approved therapiesClinical Trial Availability% cfDNA or Amplification BRAF V600E approved by FDA Dabrafenib+trametinib, Encorafenib+binimetinib approved in other indication Cobimetinib, Dabrafenib, Trametinib, Vemurafenib, Vemurafenib+cobimetinib Yes8.3%   BRCA1 R252S None (VUS) None (VUS) 0.1%     SMAD4 D463fs None None 7.2%   PD-L1 expression 35%  PRIOR THERAPY: 1) Status post right upper lobectomy with lymph node dissection on September 30, 2019 in [place] .  She declines adjuvant systemic chemotherapy. 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 Dewey.  CURRENT THERAPY: Systemic chemotherapy with carboplatin  for AUC of 5, Alimta  500 Mg/M2 and Keytruda  200 Mg IV every 3 weeks. First dose April 16, 2022. Status post 28 cycles. Starting cycle #5 she will be on maintenance treatment with Keytruda  every 3 weeks.   INTERVAL HISTORY: Brandi Holmes 73 y.o. female returns to the clinic for a follow-up visit. The patient is feeling fairly today without any concerning complaints.    She is currently undergoing maintenance treatment with immunotherapy with Keytruda . Alimta  ws discontinued due to CKD.  She has no issues with her recent immunotherapy treatment.   No fevers, chills, or night sweats. No new nausea, vomiting, diarrhea, or constipation. She denies rashes or skin changes. She denies changes with her breathing. She previously experienced constipation but managed it with medication and dietary changes  and she is no longer having trouble with this. She is prescribed lyrica  and tramadol  for chronic back pain since her surgery several years ago. She is here for evaluation and repeat blood work before undergoing cycle #29.   MEDICAL HISTORY: Past Medical History:  Diagnosis Date   Anemia    as a child only   Coronary artery calcification of native artery    Hyperlipidemia    Hypertension    Liver spots     per pt   Lung nodule    Wears glasses    Wears partial dentures     ALLERGIES:  is allergic to crab [shellfish allergy] and morphine  and codeine.  MEDICATIONS:  Current Outpatient Medications  Medication Sig Dispense Refill   aspirin  EC 81 MG tablet Take 81 mg by mouth at bedtime. Swallow whole.     atorvastatin  (LIPITOR) 20 MG tablet Take 20 mg by mouth at bedtime.     furosemide (LASIX) 20 MG tablet Take 20 mg by mouth daily.     lactose free nutrition (BOOST) LIQD Take 237 mLs by mouth 2 (two) times daily between meals.     lactulose (CHRONULAC) 10 GM/15ML solution Take 10 g by mouth 2 (two) times daily.     latanoprost (XALATAN) 0.005 % ophthalmic solution Place 1 drop into both eyes at bedtime.     lisinopril  (ZESTRIL ) 10 MG tablet Take 1 tablet (10 mg total) by mouth every morning. 90 tablet 0   pregabalin  (LYRICA ) 150 MG capsule Take 150 mg by mouth 2 (two) times daily.     prochlorperazine  (COMPAZINE ) 10 MG tablet Take 1 tablet (10 mg total) by mouth every 6 (six) hours as needed for nausea or vomiting. 30 tablet 0   tiZANidine  (ZANAFLEX ) 2  MG tablet Take 2 mg by mouth at bedtime.     traMADol  HCl 100 MG TABS Take 1 tablet by mouth QID. 1 tablet by mouth every 6 hours as needed for pain     No current facility-administered medications for this visit.    SURGICAL HISTORY:  Past Surgical History:  Procedure Laterality Date   ABDOMINAL HYSTERECTOMY  1974   heavy bleeding   ABDOMINAL HYSTERECTOMY     BRONCHIAL BRUSHINGS  08/13/2019   Procedure: BRONCHIAL BRUSHINGS;   Surgeon: Brenna Adine CROME, DO;  Location: [facility];  Service: Pulmonary;;   BRONCHIAL WASHINGS  08/13/2019   Procedure: BRONCHIAL WASHINGS;  Surgeon: Brenna Adine CROME, DO;  [facility]: [facility];  Service: Pulmonary;;   COLONOSCOPY W/ BIOPSIES AND POLYPECTOMY     DILATION AND CURETTAGE OF UTERUS     FINE NEEDLE ASPIRATION  08/13/2019   Procedure: FINE NEEDLE ASPIRATION (FNA) LINEAR;  Surgeon: Brenna Adine CROME, DO;  [facility]: [facility];  Service: Pulmonary;;   FOOT SURGERY     bilateral bunions and hammer toes   IR RADIOLOGIST EVAL & MGMT  05/20/2022   LUNG BIOPSY  08/13/2019   Procedure: LUNG BIOPSY;  Surgeon: Brenna Adine CROME, DO;  [facility];  Service: Pulmonary;;   MULTIPLE TOOTH EXTRACTIONS     VIDEO BRONCHOSCOPY WITH ENDOBRONCHIAL NAVIGATION Right 08/13/2019   Procedure: VIDEO BRONCHOSCOPY WITH ENDOBRONCHIAL NAVIGATION;  Surgeon: Brenna Adine CROME, DO;  [facility]: [facility];  Service: Pulmonary;  Laterality: Right;   VIDEO BRONCHOSCOPY WITH ENDOBRONCHIAL ULTRASOUND Right 08/13/2019   Procedure: VIDEO BRONCHOSCOPY WITH ENDOBRONCHIAL ULTRASOUND;  Surgeon: Brenna Adine CROME, DO;  [facility]: [facility];  Service: Pulmonary;  Laterality: Right;    REVIEW OF SYSTEMS:   Review of Systems  Constitutional: Negative for appetite change, chills, fatigue, fever and unexpected weight change.  HENT:   Negative for mouth sores, nosebleeds, sore throat and trouble swallowing.   Eyes: Negative for eye problems and icterus.  Respiratory: Negative for cough, hemoptysis, shortness of breath and wheezing.   Cardiovascular: Negative for chest pain and leg swelling.  Gastrointestinal: Negative for abdominal pain, constipation, diarrhea, nausea and vomiting.  Genitourinary: Negative for bladder incontinence, difficulty urinating, dysuria, frequency and hematuria.   Musculoskeletal: Negative for back pain, gait problem, neck pain and neck stiffness.  Skin: Negative for itching and rash.   Neurological: Negative for dizziness, extremity weakness, gait problem, headaches, light-headedness and seizures.  Hematological: Negative for adenopathy. Does not bruise/bleed easily.  Psychiatric/Behavioral: Negative for confusion, depression and sleep disturbance. The patient is not nervous/anxious.     PHYSICAL EXAMINATION:  There were no vitals taken for this visit.  ECOG PERFORMANCE STATUS: {CHL ONC ECOG [id number]  Physical Exam  Constitutional: Oriented to person, place, and time and well-developed, well-nourished, and in no distress. No distress.  HENT:  Head: Normocephalic and atraumatic.  Mouth/Throat: Oropharynx is clear and moist. No oropharyngeal exudate.  Eyes: Conjunctivae are normal. Right eye exhibits no discharge. Left eye exhibits no discharge. No scleral icterus.  Neck: Normal range of motion. Neck supple.  Cardiovascular: Normal rate, regular rhythm, normal heart sounds and intact distal pulses.   Pulmonary/Chest: Effort normal and breath sounds normal. No respiratory distress. No wheezes. No rales.  Abdominal: Soft. Bowel sounds are normal. Exhibits no distension and no mass. There is no tenderness.  Musculoskeletal: Normal range of motion. Exhibits no edema.  Lymphadenopathy:    No cervical adenopathy.  Neurological: Alert and oriented to person, place, and time. Exhibits normal muscle  tone. Gait normal. Coordination normal.  Skin: Skin is warm and dry. No rash noted. Not diaphoretic. No erythema. No pallor.  Psychiatric: Mood, memory and judgment normal.  Vitals reviewed.  LABORATORY DATA: Lab Results  Component Value Date   WBC 8.3 11/13/2023   HGB 13.1 11/13/2023   HCT 40.9 11/13/2023   MCV 91.7 11/13/2023   PLT 194 11/13/2023      Chemistry      Component Value Date/Time   NA 141 11/13/2023 1450   NA 142 07/22/2019 1522   K 3.9 11/13/2023 1450   CL 107 11/13/2023 1450   CO2 28 11/13/2023 1450   BUN 30 (H) 11/13/2023 1450   BUN 13  07/22/2019 1522   CREATININE 1.47 (H) 11/13/2023 1450   CREATININE 0.91 09/16/2011 1000      Component Value Date/Time   CALCIUM  9.2 11/13/2023 1450   ALKPHOS 77 11/13/2023 1450   AST 22 11/13/2023 1450   ALT 15 11/13/2023 1450   BILITOT 0.7 11/13/2023 1450       RADIOGRAPHIC STUDIES:  No results found.   ASSESSMENT/PLAN:  This is a very pleasant 73 year old African-American female diagnosed with metastatic non-small cell lung cancer, adenocarcinoma.  She was initially diagnosed as a stage IIb (T1b, N1, M0).  She is status post right upper lobectomy with lymph node dissection on September 30, 2019 in [place] .  She declined adjuvant systemic chemotherapy at that time.   She is found to have metastatic disease to the bone in November 2023.   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   She underwent palliative radiation to T9 under the care of Dr. Dewey.   She is currently on systemic therapy.  She was started on systemic chemotherapy with carboplatin  for an AUC of 5, Alimta  500 mg/m, and Keytruda  200 mg IV every 3 weeks.  She is status post 28 cycles.  Starting from cycle #5, she started maintenance treatment with immunotherapy with Keytruda  only.  Alimta  was discontinued secondary to renal insufficiency.    Labs were reviewed. Recommend that she proceed with cycle #29 today as schedule. She is ok to treat with a creatinine of  ***   We will see her back for follow-up visit in 3 weeks for evaluation repeat blood work before undergoing cycle #30.   She will continue lyrica  and tramadol  for her chronic back pain. She also uses heating pad.   The patient was advised to call immediately if she has any concerning symptoms in the interval. The patient voices understanding of current disease status and treatment options and is in agreement with the current care plan. All questions were answered. The patient knows to call the clinic with any  problems, questions or concerns. We can certainly see the patient much sooner if necessary      No orders of the defined types were placed in this encounter.    I spent {CHL ONC TIME VISIT - DTPQU:[id number]} counseling the patient face to face. The total time spent in the appointment was {CHL ONC TIME VISIT - DTPQU:[id number]}.  Kahlel Peake L Cherril Hett, PA-C 12/01/23

## 2023-12-03 ENCOUNTER — Other Ambulatory Visit: Payer: Self-pay

## 2023-12-04 ENCOUNTER — Encounter: Payer: Self-pay | Admitting: Nurse Practitioner

## 2023-12-04 ENCOUNTER — Inpatient Hospital Stay (HOSPITAL_BASED_OUTPATIENT_CLINIC_OR_DEPARTMENT_OTHER): Admitting: Nurse Practitioner

## 2023-12-04 ENCOUNTER — Inpatient Hospital Stay

## 2023-12-04 ENCOUNTER — Inpatient Hospital Stay: Attending: Physician Assistant

## 2023-12-04 VITALS — BP 125/76 | HR 86 | Temp 98.0°F | Resp 17 | Ht 63.0 in | Wt 171.9 lb

## 2023-12-04 VITALS — BP 106/61 | HR 74 | Temp 98.1°F | Resp 16

## 2023-12-04 DIAGNOSIS — N189 Chronic kidney disease, unspecified: Secondary | ICD-10-CM | POA: Diagnosis not present

## 2023-12-04 DIAGNOSIS — C3491 Malignant neoplasm of unspecified part of right bronchus or lung: Secondary | ICD-10-CM

## 2023-12-04 DIAGNOSIS — Z79620 Long term (current) use of immunosuppressive biologic: Secondary | ICD-10-CM | POA: Insufficient documentation

## 2023-12-04 DIAGNOSIS — Z923 Personal history of irradiation: Secondary | ICD-10-CM | POA: Diagnosis not present

## 2023-12-04 DIAGNOSIS — C7951 Secondary malignant neoplasm of bone: Secondary | ICD-10-CM | POA: Diagnosis not present

## 2023-12-04 DIAGNOSIS — G8929 Other chronic pain: Secondary | ICD-10-CM | POA: Diagnosis not present

## 2023-12-04 DIAGNOSIS — Z902 Acquired absence of lung [part of]: Secondary | ICD-10-CM | POA: Insufficient documentation

## 2023-12-04 DIAGNOSIS — M549 Dorsalgia, unspecified: Secondary | ICD-10-CM | POA: Diagnosis not present

## 2023-12-04 DIAGNOSIS — C3411 Malignant neoplasm of upper lobe, right bronchus or lung: Secondary | ICD-10-CM | POA: Insufficient documentation

## 2023-12-04 DIAGNOSIS — Z5112 Encounter for antineoplastic immunotherapy: Secondary | ICD-10-CM | POA: Insufficient documentation

## 2023-12-04 LAB — CBC WITH DIFFERENTIAL (CANCER CENTER ONLY)
Abs Immature Granulocytes: 0.02 K/uL (ref 0.00–0.07)
Basophils Absolute: 0.1 K/uL (ref 0.0–0.1)
Basophils Relative: 1 %
Eosinophils Absolute: 2.7 K/uL — ABNORMAL HIGH (ref 0.0–0.5)
Eosinophils Relative: 24 %
HCT: 37.3 % (ref 36.0–46.0)
Hemoglobin: 12 g/dL (ref 12.0–15.0)
Immature Granulocytes: 0 %
Lymphocytes Relative: 18 %
Lymphs Abs: 2 K/uL (ref 0.7–4.0)
MCH: 29.3 pg (ref 26.0–34.0)
MCHC: 32.2 g/dL (ref 30.0–36.0)
MCV: 91.2 fL (ref 80.0–100.0)
Monocytes Absolute: 0.8 K/uL (ref 0.1–1.0)
Monocytes Relative: 7 %
Neutro Abs: 5.5 K/uL (ref 1.7–7.7)
Neutrophils Relative %: 50 %
Platelet Count: 188 K/uL (ref 150–400)
RBC: 4.09 MIL/uL (ref 3.87–5.11)
RDW: 13 % (ref 11.5–15.5)
WBC Count: 11 K/uL — ABNORMAL HIGH (ref 4.0–10.5)
nRBC: 0 % (ref 0.0–0.2)

## 2023-12-04 LAB — CMP (CANCER CENTER ONLY)
ALT: 16 U/L (ref 0–44)
AST: 26 U/L (ref 15–41)
Albumin: 3.6 g/dL (ref 3.5–5.0)
Alkaline Phosphatase: 85 U/L (ref 38–126)
Anion gap: 6 (ref 5–15)
BUN: 22 mg/dL (ref 8–23)
CO2: 26 mmol/L (ref 22–32)
Calcium: 8.9 mg/dL (ref 8.9–10.3)
Chloride: 111 mmol/L (ref 98–111)
Creatinine: 1.37 mg/dL — ABNORMAL HIGH (ref 0.44–1.00)
GFR, Estimated: 40 mL/min — ABNORMAL LOW (ref >60)
Glucose, Bld: 114 mg/dL — ABNORMAL HIGH (ref 70–99)
Potassium: 4.2 mmol/L (ref 3.5–5.1)
Sodium: 143 mmol/L (ref 135–145)
Total Bilirubin: 0.6 mg/dL (ref 0.0–1.2)
Total Protein: 6.4 g/dL — ABNORMAL LOW (ref 6.5–8.1)

## 2023-12-04 LAB — TSH: TSH: 0.638 u[IU]/mL (ref 0.350–4.500)

## 2023-12-04 MED ORDER — SODIUM CHLORIDE 0.9 % IV SOLN
Freq: Once | INTRAVENOUS | Status: AC
Start: 2023-12-04 — End: 2023-12-04

## 2023-12-04 MED ORDER — SODIUM CHLORIDE 0.9 % IV SOLN
200.0000 mg | Freq: Once | INTRAVENOUS | Status: AC
  Administered 2023-12-04: 200 mg via INTRAVENOUS
  Filled 2023-12-04: qty 200

## 2023-12-04 NOTE — Assessment & Plan Note (Signed)
 initially diagnosed as a stage IIb (T1b, N1, M0).  She is status post right upper lobectomy with lymph node dissection on September 30, 2019 in [place] .  She declined adjuvant systemic chemotherapy at that time.  She was found to have metastatic disease to the bone in November 2023.  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   She underwent palliative radiation to T9 under the care of Dr. Dewey.   She is currently on systemic therapy.  She was started on systemic chemotherapy with carboplatin  for an AUC of 5, Alimta  500 mg/m, and Keytruda  200 mg IV every 3 weeks.  She is status post 16 cycles.  Starting from cycle #5, she started maintenance treatment with immunotherapy with Keytruda  only.  Alimta  was discontinued secondary to renal insufficiency.   Restaging CT CAP reviewed with the patient at visit 03/20/2023. The scan did not show disease progression.  She proceeded with cycle 29 of Keytruda , and will continue this every 3 weeks.

## 2023-12-04 NOTE — Progress Notes (Signed)
 [facility] OFFICE PROGRESS NOTE  Brandi Crigler, MD [address] [place] [address]  DIAGNOSIS: Metastatic non-small cell lung cancer, adenocarcinoma that was initially diagnosed as stage IIb (T1b, N1, M0) non-small cell lung cancer, adenocarcinoma presented with right upper lobe lung nodule   Detected Alteration(s) / Biomarker(s) Associated FDA-approved therapiesClinical Trial Availability% cfDNA or Amplification BRAF V600E approved by FDA Dabrafenib+trametinib, Encorafenib+binimetinib approved in other indication Cobimetinib, Dabrafenib, Trametinib, Vemurafenib, Vemurafenib+cobimetinib Yes8.3%   BRCA1 R252S None (VUS) None (VUS) 0.1%     SMAD4 D436fs None None 7.2%   PD-L1 expression 35%  PRIOR THERAPY: 1) Status post right upper lobectomy with lymph node dissection on September 30, 2019 in [place] .  She declines adjuvant systemic chemotherapy. 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 Dewey.  CURRENT THERAPY: Systemic chemotherapy with carboplatin  for AUC of 5, Alimta  500 Mg/M2 and Keytruda  200 Mg IV every 3 weeks. First dose April 16, 2022. Status post 28 cycles. Starting cycle #5 she will be on maintenance treatment with Keytruda  every 3 weeks.   INTERVAL HISTORY: Brandi Holmes 73 y.o. female returns to the clinic for a follow-up visit.  She is having persistent pain in the mid back area, adjacent to right lung lobectomy scar.  She states it is tender, even to touch.  She has noted her fibrotic sticking to this area.  No lesion noted at this time.  She states her primary care provider manages medication for pain which is at a tolerable level.  She is currently undergoing maintenance treatment with immunotherapy with Keytruda . Alimta  ws discontinued due to CKD.  She has no issues with her recent immunotherapy treatment.  She reports no  fevers, chills, or night sweats. No new nausea, vomiting, diarrhea, or constipation. She denies rashes or skin changes. She denies changes with her breathing. She previously experienced constipation but managed it with medication and dietary changes and she is no longer having trouble with this. She is prescribed lyrica  and tramadol  for chronic back pain since her surgery several years ago. She is here for evaluation and repeat blood work before undergoing cycle #29.   MEDICAL HISTORY: Past Medical History:  Diagnosis Date   Anemia    as a child only   Coronary artery calcification of native artery    Hyperlipidemia    Hypertension    Liver spots     per pt   Lung nodule    Wears glasses    Wears partial dentures     ALLERGIES:  is allergic to crab [shellfish allergy] and morphine  and codeine.  MEDICATIONS:  Current Outpatient Medications  Medication Sig Dispense Refill   aspirin  EC 81 MG tablet Take 81 mg by mouth at bedtime. Swallow whole.     atorvastatin  (LIPITOR) 20 MG tablet Take 20 mg by mouth at bedtime.     furosemide (LASIX) 20 MG tablet Take 20 mg by mouth daily.     lactose free nutrition (BOOST) LIQD Take 237 mLs by mouth 2 (two) times daily between meals.     lactulose (CHRONULAC) 10 GM/15ML solution Take 10 g by mouth 2 (two) times daily.     latanoprost (XALATAN) 0.005 % ophthalmic solution Place 1 drop into both eyes at bedtime.     lisinopril  (ZESTRIL ) 10 MG tablet Take 1 tablet (10 mg total) by mouth every morning. 90 tablet 0  pregabalin  (LYRICA ) 150 MG capsule Take 150 mg by mouth 2 (two) times daily.     prochlorperazine  (COMPAZINE ) 10 MG tablet Take 1 tablet (10 mg total) by mouth every 6 (six) hours as needed for nausea or vomiting. 30 tablet 0   tiZANidine  (ZANAFLEX ) 2 MG tablet Take 2 mg by mouth at bedtime.     traMADol  HCl 100 MG TABS Take 1 tablet by mouth QID. 1 tablet by mouth every 6 hours as needed for pain     No current facility-administered  medications for this visit.   Facility-Administered Medications Ordered in Other Visits  Medication Dose Route Frequency Provider Last Rate Last Admin   pembrolizumab  (KEYTRUDA ) 200 mg in sodium chloride  0.9 % 50 mL chemo infusion  200 mg Intravenous Once Sherrod Sherrod, MD 116 mL/hr at 12/04/23 1625 200 mg at 12/04/23 1625    SURGICAL HISTORY:  Past Surgical History:  Procedure Laterality Date   ABDOMINAL HYSTERECTOMY  1974   heavy bleeding   ABDOMINAL HYSTERECTOMY     BRONCHIAL BRUSHINGS  08/13/2019   Procedure: BRONCHIAL BRUSHINGS;  Surgeon: Brenna Adine CROME, DO;  Location: [facility];  Service: Pulmonary;;   BRONCHIAL WASHINGS  08/13/2019   Procedure: BRONCHIAL WASHINGS;  Surgeon: Brenna Adine CROME, DO;  Location: [facility];  Service: Pulmonary;;   COLONOSCOPY W/ BIOPSIES AND POLYPECTOMY     DILATION AND CURETTAGE OF UTERUS     FINE NEEDLE ASPIRATION  08/13/2019   Procedure: FINE NEEDLE ASPIRATION (FNA) LINEAR;  Surgeon: Brenna Adine CROME, DO;  Location: [facility];  Service: Pulmonary;;   FOOT SURGERY     bilateral bunions and hammer toes   IR RADIOLOGIST EVAL & MGMT  05/20/2022   LUNG BIOPSY  08/13/2019   Procedure: LUNG BIOPSY;  Surgeon: Brenna Adine CROME, DO;  Location: [facility];  Service: Pulmonary;;   MULTIPLE TOOTH EXTRACTIONS     VIDEO BRONCHOSCOPY WITH ENDOBRONCHIAL NAVIGATION Right 08/13/2019   Procedure: VIDEO BRONCHOSCOPY WITH ENDOBRONCHIAL NAVIGATION;  Surgeon: Brenna Adine CROME, DO;  Location: [facility];  Service: Pulmonary;  Laterality: Right;   VIDEO BRONCHOSCOPY WITH ENDOBRONCHIAL ULTRASOUND Right 08/13/2019   Procedure: VIDEO BRONCHOSCOPY WITH ENDOBRONCHIAL ULTRASOUND;  Surgeon: Brenna Adine CROME, DO;  Location: [facility];  Service: Pulmonary;  Laterality: Right;    REVIEW OF SYSTEMS:   Review of Systems  Constitutional: Negative for appetite change, chills, fatigue, fever and unexpected weight change.  HENT:   Negative for mouth sores, nosebleeds, sore  throat and trouble swallowing.   Eyes: Negative for eye problems and icterus.  Respiratory: Negative for cough, hemoptysis, shortness of breath and wheezing.   Cardiovascular: Negative for chest pain and leg swelling.  Gastrointestinal: Negative for abdominal pain, constipation, diarrhea, nausea and vomiting.  Genitourinary: Negative for bladder incontinence, difficulty urinating, dysuria, frequency and hematuria.   Musculoskeletal: Negative for back pain, gait problem, neck pain and neck stiffness.  Skin: Negative for itching and rash.  Neurological: Negative for dizziness, extremity weakness, gait problem, headaches, light-headedness and seizures.  Hematological: Negative for adenopathy. Does not bruise/bleed easily.  Psychiatric/Behavioral: Negative for confusion, depression and sleep disturbance. The patient is not nervous/anxious.     PHYSICAL EXAMINATION:  Blood pressure 125/76, pulse 86, temperature 98 F (36.7 C), resp. rate 17, height 5' 3 (1.6 m), weight 171 lb 14.4 oz (78 kg), SpO2 99%.  ECOG PERFORMANCE STATUS: 1 - Symptomatic but completely ambulatory  Physical Exam  Constitutional: Oriented to person, place, and time and well-developed, well-nourished, and in no distress.  No distress.  HENT:  Head: Normocephalic and atraumatic.  Mouth/Throat: Oropharynx is clear and moist. No oropharyngeal exudate.  Eyes: Conjunctivae are normal. Right eye exhibits no discharge. Left eye exhibits no discharge. No scleral icterus.  Neck: Normal range of motion. Neck supple.  Cardiovascular: Normal rate, regular rhythm, normal heart sounds and intact distal pulses.   Pulmonary/Chest: Effort normal and breath sounds normal. No respiratory distress. No wheezes. No rales.  Abdominal: Soft. Bowel sounds are normal. Exhibits no distension and no mass. There is no tenderness.  Musculoskeletal: Normal range of motion. Exhibits no edema. Examined the area of concern in the mid back area. There are  no palpable masses or lumps. There are no skin lesions with or without drainage.  Lymphadenopathy:    No cervical adenopathy.  Neurological: Alert and oriented to person, place, and time. Exhibits normal muscle tone. Gait normal. Coordination normal.  Skin: Skin is warm and dry. No rash noted. Not diaphoretic. No erythema. No pallor.  Psychiatric: Mood, memory and judgment normal.  Vitals reviewed.  LABORATORY DATA: Lab Results  Component Value Date   WBC 11.0 (H) 12/04/2023   HGB 12.0 12/04/2023   HCT 37.3 12/04/2023   MCV 91.2 12/04/2023   PLT 188 12/04/2023      Latest Ref Rng & Units 12/04/2023    2:17 PM 11/13/2023    2:50 PM 10/22/2023   10:09 AM  CMP  Glucose 70 - 99 mg/dL 885  71  90   BUN 8 - 23 mg/dL 22  30  26    Creatinine 0.44 - 1.00 mg/dL 8.62  8.52  8.55   Sodium 135 - 145 mmol/L 143  141  143   Potassium 3.5 - 5.1 mmol/L 4.2  3.9  3.8   Chloride 98 - 111 mmol/L 111  107  109   CO2 22 - 32 mmol/L 26  28  26    Calcium  8.9 - 10.3 mg/dL 8.9  9.2  9.1   Total Protein 6.5 - 8.1 g/dL 6.4  7.1  6.5   Total Bilirubin 0.0 - 1.2 mg/dL 0.6  0.7  0.8   Alkaline Phos 38 - 126 U/L 85  77  86   AST 15 - 41 U/L 26  22  39   ALT 0 - 44 U/L 16  15  24         RADIOGRAPHIC STUDIES:  No results found.   ASSESSMENT/PLAN:  This is a very pleasant 73 year old African-American female diagnosed with metastatic non-small cell lung cancer, adenocarcinoma.  She was initially diagnosed as a stage IIb (T1b, N1, M0).  She is status post right upper lobectomy with lymph node dissection on September 30, 2019 in [place] .  She declined adjuvant systemic chemotherapy at that time.   She is found to have metastatic disease to the bone in November 2023.   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   She underwent palliative radiation to T9 under the care of Dr. Dewey.   She is currently on systemic therapy.  She was started on systemic chemotherapy  with carboplatin  for an AUC of 5, Alimta  500 mg/m, and Keytruda  200 mg IV every 3 weeks.  She is status post 28 cycles.  Starting from cycle #5, she started maintenance treatment with immunotherapy with Keytruda  only.  Alimta  was discontinued secondary to renal insufficiency.    Labs were reviewed. Recommend that she proceed with cycle #29 today as  schedule. She is ok to treat with a creatinine of  1.37 and eGFR 40.    We will see her back for follow-up visit in 3 weeks for evaluation repeat blood work before undergoing cycle #30.   She will continue lyrica  and tramadol  for her chronic back pain. She also uses heating pad.   The patient was advised to call immediately if she has any concerning symptoms in the interval. The patient voices understanding of current disease status and treatment options and is in agreement with the current care plan. All questions were answered. The patient knows to call the clinic with any problems, questions or concerns. We can certainly see the patient much sooner if necessary      No orders of the defined types were placed in this encounter.    I spent 20 minutes counseling the patient face to face. The total time spent in the appointment was 25 minutes.  Powell FORBES Lessen, NP 12/04/23

## 2023-12-04 NOTE — Patient Instructions (Signed)
 [facility] - A DEPT OF [facility]  Discharge Instructions: Thank you for choosing [facility] to provide your oncology and hematology care.   If you have a lab appointment with the Cancer Center, please go directly to the Cancer Center and check in at the registration area.   Wear comfortable clothing and clothing appropriate for easy access to any Portacath or PICC line.   We strive to give you quality time with your provider. You may need to reschedule your appointment if you arrive late (15 or more minutes).  Arriving late affects you and other patients whose appointments are after yours.  Also, if you miss three or more appointments without notifying the office, you may be dismissed from the clinic at the provider's discretion.      For prescription refill requests, have your pharmacy contact our office and allow 72 hours for refills to be completed.    Today you received the following chemotherapy and/or immunotherapy agent: Pembrolizumab (Keytruda)   To help prevent nausea and vomiting after your treatment, we encourage you to take your nausea medication as directed.  BELOW ARE SYMPTOMS THAT SHOULD BE REPORTED IMMEDIATELY: *FEVER GREATER THAN 100.4 F (38 C) OR HIGHER *CHILLS OR SWEATING *NAUSEA AND VOMITING THAT IS NOT CONTROLLED WITH YOUR NAUSEA MEDICATION *UNUSUAL SHORTNESS OF BREATH *UNUSUAL BRUISING OR BLEEDING *URINARY PROBLEMS (pain or burning when urinating, or frequent urination) *BOWEL PROBLEMS (unusual diarrhea, constipation, pain near the anus) TENDERNESS IN MOUTH AND THROAT WITH OR WITHOUT PRESENCE OF ULCERS (sore throat, sores in mouth, or a toothache) UNUSUAL RASH, SWELLING OR PAIN  UNUSUAL VAGINAL DISCHARGE OR ITCHING   Items with * indicate a potential emergency and should be followed up as soon as possible or go to the Emergency Department if any problems should occur.  Please show the CHEMOTHERAPY ALERT CARD or  IMMUNOTHERAPY ALERT CARD at check-in to the Emergency Department and triage nurse.  Should you have questions after your visit or need to cancel or reschedule your appointment, please contact [facility] - A DEPT OF [facility]  Dept: [phone]  and follow the prompts.  Office hours are 8:00 a.m. to 4:30 p.m. Monday - Friday. Please note that voicemails left after 4:00 p.m. may not be returned until the following business day.  We are closed weekends and major holidays. You have access to a nurse at all times for urgent questions. Please call the main number to the clinic Dept: [phone] and follow the prompts.   For any non-urgent questions, you may also contact your provider using MyChart. We now offer e-Visits for anyone 6 and older to request care online for non-urgent symptoms. For details visit [email or web address].   Also download the MyChart app! Go to the app store, search "MyChart", open the app, select , and log in with your MyChart username and password.

## 2023-12-05 LAB — T4: T4, Total: 5.3 ug/dL (ref 4.5–12.0)

## 2023-12-22 ENCOUNTER — Encounter: Payer: Self-pay | Admitting: Cardiology

## 2023-12-22 ENCOUNTER — Ambulatory Visit: Attending: Cardiology | Admitting: Cardiology

## 2023-12-22 VITALS — BP 104/76 | HR 74 | Resp 16 | Ht 63.0 in | Wt 164.0 lb

## 2023-12-22 DIAGNOSIS — I959 Hypotension, unspecified: Secondary | ICD-10-CM

## 2023-12-22 DIAGNOSIS — C3490 Malignant neoplasm of unspecified part of unspecified bronchus or lung: Secondary | ICD-10-CM

## 2023-12-22 DIAGNOSIS — I2510 Atherosclerotic heart disease of native coronary artery without angina pectoris: Secondary | ICD-10-CM | POA: Diagnosis not present

## 2023-12-22 DIAGNOSIS — I2584 Coronary atherosclerosis due to calcified coronary lesion: Secondary | ICD-10-CM

## 2023-12-22 NOTE — Progress Notes (Signed)
 Cardiology Office Note:  .   Date:  12/22/2023  ID:  Brandi Holmes, DOB 10-14-1950, MRN [id number] PCP:  [name], MD  Former Cardiology Providers: NA [facility] Providers Cardiologist:  None , [facility] (established care 07/15/2019) Electrophysiologist:  None  Click to update primary MD,subspecialty MD or APP then REFRESH:1}    Chief Complaint  Patient presents with   Coronary artery calcification of native artery   Follow-up    History of Present Illness: .   Brandi Holmes is a 73 y.o. African-American female whose past medical history and cardiovascular risk factors includes: Coronary artery calcification, metastatic non-small cell lung cancer/adenocarcinoma/ s/p lobectomy with lymph node excision, hypertension, former smoker, postmenopausal female, advanced age, obesity.   Patient is being followed by the practice given her history of coronary artery calcification.  Since establishing care patient has undergone appropriate ischemic workup as outlined below.  Routine follow-up echocardiogram in 2024 noted pericardial effusion.  Did speak with her oncologist at that time, please refer with the result note for additional detail.  Follow-up echocardiogram noted improvement in the size of her pericardial effusion.  She presents today for a greater than 1 year follow-up visit.  Clinically denies anginal chest pain or heart failure symptoms. Continues to be on chemotherapy for her metastatic non-small cell lung cancer, every 21 days.  And follows with hematology oncology regularly. Intermittently patient does have pain behind her knee and prior venous duplex and noted a cystic structure likely a Baker's cyst.  She was offered orthopedic consult at that time but patient refused.  Patient is reconsidering this and will discuss further with PCP.  Patient states that she goes to the local [employer] and uses a shopping cart to support her as she ambulates in the store for  about 3 hours twice a week.  This is her way of incorporating regular physical activity.    Review of Systems: .   Review of Systems  Constitutional: Positive for malaise/fatigue.  Cardiovascular:  Negative for chest pain, claudication, irregular heartbeat, leg swelling, near-syncope, orthopnea, palpitations, paroxysmal nocturnal dyspnea and syncope.  Respiratory:  Positive for shortness of breath (chronic and stable).   Hematologic/Lymphatic: Negative for bleeding problem.    Studies Reviewed:   EKG: EKG Interpretation Date/Time:  Monday December 22 2023 15:25:47 EDT Ventricular Rate:  69 PR Interval:  124 QRS Duration:  84 QT Interval:  392 QTC Calculation: 420 R Axis:   -14  Text Interpretation: Normal sinus rhythm Minimal voltage criteria for LVH, may be normal variant ( Cornell product ) When compared with ECG of 07-Sep-2022 01:23, T wave amplitude has increased in Lateral leads Confirmed by Michele Richardson [phone]) on 12/22/2023 3:27:20 PM  Echocardiogram: 07/27/2019: LVEF 55%, grade 1 diastolic impairment, mild LVH, mild TR, RVSP 26 mmHg.  10/2022: LVEF 60 to 65%, moderate pericardial effusion, circumferential, no evidence of tamponade.  Estimated RAP 8 mmHg  06/17/2023 (limited echo)  1. Compared to 11/05/22 pericardial effusion is smaller.   2. Left ventricular ejection fraction, by estimation, is 60 to 65%. The  left ventricle has normal function. The left ventricle has no regional  wall motion abnormalities. The average left ventricular global  longitudinal strain is -24.4 %. The global  longitudinal strain is normal.   3. Right ventricular systolic function is normal. The right ventricular  size is normal.   4. A small pericardial effusion is present.   5. The mitral valve is normal in structure. Trivial mitral valve  regurgitation.  No evidence of mitral stenosis.   6. The aortic valve is tricuspid. Aortic valve regurgitation is trivial.  Aortic valve  sclerosis/calcification is present, without any evidence of  aortic stenosis.   7. The inferior vena cava is normal in size with greater than 50%  respiratory variability, suggesting right atrial pressure of 3 mmHg.    Stress Testing:  Lexiscan Tetrofosmin stress test 10/02/2020: Low risk study.  RADIOLOGY: NA  Risk Assessment/Calculations:   NA   Labs:       Latest Ref Rng & Units 12/04/2023    2:17 PM 11/13/2023    2:50 PM 10/22/2023   10:09 AM  CBC  WBC 4.0 - 10.5 K/uL 11.0  8.3  10.1   Hemoglobin 12.0 - 15.0 g/dL 87.9  86.8  87.6   Hematocrit 36.0 - 46.0 % 37.3  40.9  37.9   Platelets 150 - 400 K/uL 188  194  182        Latest Ref Rng & Units 12/04/2023    2:17 PM 11/13/2023    2:50 PM 10/22/2023   10:09 AM  BMP  Glucose 70 - 99 mg/dL 885  71  90   BUN 8 - 23 mg/dL 22  30  26    Creatinine 0.44 - 1.00 mg/dL 8.62  8.52  8.55   Sodium 135 - 145 mmol/L 143  141  143   Potassium 3.5 - 5.1 mmol/L 4.2  3.9  3.8   Chloride 98 - 111 mmol/L 111  107  109   CO2 22 - 32 mmol/L 26  28  26    Calcium  8.9 - 10.3 mg/dL 8.9  9.2  9.1       Latest Ref Rng & Units 12/04/2023    2:17 PM 11/13/2023    2:50 PM 10/22/2023   10:09 AM  CMP  Glucose 70 - 99 mg/dL 885  71  90   BUN 8 - 23 mg/dL 22  30  26    Creatinine 0.44 - 1.00 mg/dL 8.62  8.52  8.55   Sodium 135 - 145 mmol/L 143  141  143   Potassium 3.5 - 5.1 mmol/L 4.2  3.9  3.8   Chloride 98 - 111 mmol/L 111  107  109   CO2 22 - 32 mmol/L 26  28  26    Calcium  8.9 - 10.3 mg/dL 8.9  9.2  9.1   Total Protein 6.5 - 8.1 g/dL 6.4  7.1  6.5   Total Bilirubin 0.0 - 1.2 mg/dL 0.6  0.7  0.8   Alkaline Phos 38 - 126 U/L 85  77  86   AST 15 - 41 U/L 26  22  39   ALT 0 - 44 U/L 16  15  24      Lab Results  Component Value Date   CHOL 175 08/22/2020   HDL 55 08/22/2020   LDLCALC 106 (H) 08/22/2020   LDLDIRECT 99 08/22/2020   TRIG 74 08/22/2020   CHOLHDL 3.7 09/16/2011   No results for input(s): LIPOA in the last 8760 hours. No  components found for: NTPROBNP No results for input(s): PROBNP in the last 8760 hours. Recent Labs    07/30/23 1349 10/02/23 1412 12/04/23 1418  TSH 0.554 0.598 0.638    Physical Exam:    Today's Vitals   12/22/23 1525  BP: 104/76  Pulse: 74  Resp: 16  SpO2: 98%  Weight: 164 lb (74.4 kg)  Height: 5' 3 (1.6 m)   Body  mass index is 29.05 kg/m. Wt Readings from Last 3 Encounters:  12/22/23 164 lb (74.4 kg)  12/04/23 171 lb 14.4 oz (78 kg)  11/13/23 162 lb (73.5 kg)    Physical Exam  Constitutional: No distress.  hemodynamically stable  Neck: No JVD present.  Cardiovascular: Normal rate, regular rhythm, S1 normal and S2 normal. Exam reveals no gallop, no S3 and no S4.  No murmur heard. Pulmonary/Chest: Effort normal and breath sounds normal. No stridor. She has no wheezes. She has no rales.  Musculoskeletal:        General: No edema.     Cervical back: Neck supple.  Skin: Skin is warm.     Impression & Recommendation(s):  Impression:   ICD-10-CM   1. Coronary artery calcification of native artery  I25.10 EKG 12-Lead   I25.84     2. Hypotension, unspecified hypotension type  I95.9     3. Metastatic non-small cell lung cancer (HCC)  C34.90        Recommendation(s):  Coronary artery calcification of native artery Denies anginal chest pain. EKG is nonischemic Has undergone appropriate ischemic workup in the past as outlined above. Continue aspirin  81 mg p.o. daily. Continue Lipitor 20 mg p.o. nightly Overall functional capacity remains relatively stable Reemphasized the importance of secondary prevention with focus on improving the modifiable cardiovascular risk factors such as glycemic control, lipid management.  Hypotension, unspecified hypotension type Office blood pressures are low, asymptomatic. Home blood pressures are higher on current medical therapy. Follows with PCP. Continue lisinopril  10 mg p.o. every morning. Continue Lasix 20 mg p.o.  daily  Metastatic non-small cell lung cancer (HCC) Follows with medical oncology.  From cardiovascular standpoint patient remains relatively stable with no exertional chest pain or heart failure symptoms.  Overall functional capacity also remains stable.  No changes have been made significantly over the last 2 office visits.  I will defer her back to PCP for longitudinal care and she can be referred back to cardiology if need arises.  Patient agreeable with the plan of care.  Orders Placed:  Orders Placed This Encounter  Procedures   EKG 12-Lead     Final Medication List:   No orders of the defined types were placed in this encounter.   Medications Discontinued During This Encounter  Medication Reason   lactulose (CHRONULAC) 10 GM/15ML solution Patient Preference   lactose free nutrition (BOOST) LIQD Patient Preference     Current Outpatient Medications:    aspirin  EC 81 MG tablet, Take 81 mg by mouth at bedtime. Swallow whole., Disp: , Rfl:    atorvastatin  (LIPITOR) 20 MG tablet, Take 20 mg by mouth at bedtime., Disp: , Rfl:    folic acid  (FOLVITE ) 1 MG tablet, Take 1 mg by mouth daily., Disp: , Rfl:    furosemide (LASIX) 20 MG tablet, Take 20 mg by mouth daily., Disp: , Rfl:    latanoprost (XALATAN) 0.005 % ophthalmic solution, Place 1 drop into both eyes at bedtime., Disp: , Rfl:    lisinopril  (ZESTRIL ) 10 MG tablet, Take 1 tablet (10 mg total) by mouth every morning., Disp: 90 tablet, Rfl: 0   pregabalin  (LYRICA ) 150 MG capsule, Take 150 mg by mouth 2 (two) times daily., Disp: , Rfl:    prochlorperazine  (COMPAZINE ) 10 MG tablet, Take 1 tablet (10 mg total) by mouth every 6 (six) hours as needed for nausea or vomiting., Disp: 30 tablet, Rfl: 0   tiZANidine  (ZANAFLEX ) 2 MG tablet, Take 2 mg by mouth  at bedtime., Disp: , Rfl:    traMADol  HCl 100 MG TABS, Take 1 tablet by mouth QID. 1 tablet by mouth every 6 hours as needed for pain, Disp: , Rfl:   Consent:   NA  Disposition:    As needed  Her questions and concerns were addressed to her satisfaction. She voices understanding of the recommendations provided during this encounter.    Signed, Madonna Michele HAS, [facility]  A Division of [facility] [address] [place], [place] [address]  12/22/2023 5:01 PM

## 2023-12-22 NOTE — Patient Instructions (Addendum)
 Medication Instructions:  Your physician recommends that you continue on your current medications as directed. Please refer to the Current Medication list given to you today.  *If you need a refill on your cardiac medications before your next appointment, please call your pharmacy*  Lab Work: None.  If you have labs (blood work) drawn today and your tests are completely normal, you will receive your results only by: MyChart Message (if you have MyChart) OR A paper copy in the mail If you have any lab test that is abnormal or we need to change your treatment, we will call you to review the results.  Testing/Procedures: None.  Follow-Up: At [facility], you and your health needs are our priority.  As part of our continuing mission to provide you with exceptional heart care, our providers are all part of one team.  This team includes your primary Cardiologist (physician) and Advanced Practice Providers or APPs (Physician Assistants and Nurse Practitioners) who all work together to provide you with the care you need, when you need it.  Your next appointment will be as needed and it will be with:    Provider:   Dr. Madonna Large, DO

## 2023-12-25 ENCOUNTER — Inpatient Hospital Stay

## 2023-12-25 ENCOUNTER — Inpatient Hospital Stay (HOSPITAL_BASED_OUTPATIENT_CLINIC_OR_DEPARTMENT_OTHER): Admitting: Internal Medicine

## 2023-12-25 ENCOUNTER — Inpatient Hospital Stay: Attending: Physician Assistant

## 2023-12-25 ENCOUNTER — Encounter: Payer: Self-pay | Admitting: Internal Medicine

## 2023-12-25 VITALS — BP 104/73 | HR 69 | Temp 97.9°F | Resp 17 | Wt 165.0 lb

## 2023-12-25 DIAGNOSIS — Z5112 Encounter for antineoplastic immunotherapy: Secondary | ICD-10-CM | POA: Insufficient documentation

## 2023-12-25 DIAGNOSIS — C7951 Secondary malignant neoplasm of bone: Secondary | ICD-10-CM | POA: Diagnosis not present

## 2023-12-25 DIAGNOSIS — E860 Dehydration: Secondary | ICD-10-CM | POA: Insufficient documentation

## 2023-12-25 DIAGNOSIS — Z902 Acquired absence of lung [part of]: Secondary | ICD-10-CM | POA: Diagnosis not present

## 2023-12-25 DIAGNOSIS — C3490 Malignant neoplasm of unspecified part of unspecified bronchus or lung: Secondary | ICD-10-CM | POA: Diagnosis not present

## 2023-12-25 DIAGNOSIS — C3411 Malignant neoplasm of upper lobe, right bronchus or lung: Secondary | ICD-10-CM | POA: Insufficient documentation

## 2023-12-25 DIAGNOSIS — N289 Disorder of kidney and ureter, unspecified: Secondary | ICD-10-CM | POA: Insufficient documentation

## 2023-12-25 DIAGNOSIS — Z1509 Genetic susceptibility to other malignant neoplasm: Secondary | ICD-10-CM | POA: Diagnosis not present

## 2023-12-25 DIAGNOSIS — G893 Neoplasm related pain (acute) (chronic): Secondary | ICD-10-CM | POA: Insufficient documentation

## 2023-12-25 LAB — CBC WITH DIFFERENTIAL (CANCER CENTER ONLY)
Abs Immature Granulocytes: 0.01 K/uL (ref 0.00–0.07)
Basophils Absolute: 0.1 K/uL (ref 0.0–0.1)
Basophils Relative: 1 %
Eosinophils Absolute: 4.1 K/uL — ABNORMAL HIGH (ref 0.0–0.5)
Eosinophils Relative: 41 %
HCT: 36.2 % (ref 36.0–46.0)
Hemoglobin: 11.7 g/dL — ABNORMAL LOW (ref 12.0–15.0)
Immature Granulocytes: 0 %
Lymphocytes Relative: 23 %
Lymphs Abs: 2.3 K/uL (ref 0.7–4.0)
MCH: 29.3 pg (ref 26.0–34.0)
MCHC: 32.3 g/dL (ref 30.0–36.0)
MCV: 90.5 fL (ref 80.0–100.0)
Monocytes Absolute: 0.7 K/uL (ref 0.1–1.0)
Monocytes Relative: 7 %
Neutro Abs: 2.8 K/uL (ref 1.7–7.7)
Neutrophils Relative %: 28 %
Platelet Count: 182 K/uL (ref 150–400)
RBC: 4 MIL/uL (ref 3.87–5.11)
RDW: 12.8 % (ref 11.5–15.5)
WBC Count: 10 K/uL (ref 4.0–10.5)
nRBC: 0 % (ref 0.0–0.2)

## 2023-12-25 LAB — CMP (CANCER CENTER ONLY)
ALT: 16 U/L (ref 0–44)
AST: 23 U/L (ref 15–41)
Albumin: 3.7 g/dL (ref 3.5–5.0)
Alkaline Phosphatase: 81 U/L (ref 38–126)
Anion gap: 6 (ref 5–15)
BUN: 27 mg/dL — ABNORMAL HIGH (ref 8–23)
CO2: 27 mmol/L (ref 22–32)
Calcium: 8.8 mg/dL — ABNORMAL LOW (ref 8.9–10.3)
Chloride: 108 mmol/L (ref 98–111)
Creatinine: 1.6 mg/dL — ABNORMAL HIGH (ref 0.44–1.00)
GFR, Estimated: 33 mL/min — ABNORMAL LOW (ref >60)
Glucose, Bld: 89 mg/dL (ref 70–99)
Potassium: 4.2 mmol/L (ref 3.5–5.1)
Sodium: 141 mmol/L (ref 135–145)
Total Bilirubin: 0.6 mg/dL (ref 0.0–1.2)
Total Protein: 6.5 g/dL (ref 6.5–8.1)

## 2023-12-25 MED ORDER — SODIUM CHLORIDE 0.9 % IV SOLN
200.0000 mg | Freq: Once | INTRAVENOUS | Status: AC
  Administered 2023-12-25: 200 mg via INTRAVENOUS
  Filled 2023-12-25: qty 200

## 2023-12-25 MED ORDER — SODIUM CHLORIDE 0.9 % IV SOLN: Freq: Once | INTRAVENOUS | Status: AC

## 2023-12-25 NOTE — Progress Notes (Signed)
 [facility] [phone]   Fax:([phone]  OFFICE PROGRESS NOTE  Brandi Crigler, MD [address] [place] [address]  DIAGNOSIS: Metastatic non-small cell lung cancer, adenocarcinoma that was initially diagnosed as stage IIb (T1b, N1, M0) non-small cell lung cancer, adenocarcinoma presented with right upper lobe lung nodule in June 2021.   Detected Alteration(s) / Biomarker(s) Associated FDA-approved therapies Clinical Trial Availability % cfDNA or Amplification BRAF V600E approved by FDA Dabrafenib+trametinib, Encorafenib+binimetinib approved in other indication Cobimetinib, Dabrafenib, Trametinib, Vemurafenib, Vemurafenib+cobimetinib Yes 8.3%  BRCA1 R252S None (VUS) None (VUS) 0.1%   SMAD4 D467fs None None 7.2%  PD-L1 expression 35%  PRIOR THERAPY:  1) Status post right upper lobectomy with lymph node dissection on September 30, 2019 in [place] .  She declines adjuvant systemic chemotherapy. 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 Dewey.  CURRENT THERAPY: Systemic chemotherapy with carboplatin  for AUC of 5, Alimta  500 Mg/M2 and Keytruda  200 Mg IV every 3 weeks.  First dose April 16, 2022.  Status post 29  cycles.  Starting cycle #5 she will be on maintenance treatment with Keytruda  every 3 weeks.  INTERVAL HISTORY: Brandi Holmes 74 y.o. female returns to the clinic today for follow-up visit. Discussed the use of AI scribe software for clinical note transcription with the patient, who gave verbal consent to proceed.  History of Present Illness Brandi Holmes is a 73 year old female who presents for evaluation before starting her thirtieth cycle of treatment of her treatment with Keytruda .  She is undergoing treatment and sometimes requires additional tramadol  for pain management, particularly in her  back where she previously had a biopsy. The pain is located around the T9 vertebra and is managed with her current pain medication.  She experiences coughing with phlegm, which she describes as feeling like a large amount but is minimal when expelled. Drinking hot liquids helps clear the mucus. 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  She feels dehydrated and plans to increase her water intake. She uses a walker to assist with mobility and reports managing well overall. Some coughing with phlegm.    MEDICAL HISTORY: Past Medical History:  Diagnosis Date   Anemia    as a child only   Coronary artery calcification of native artery    Hyperlipidemia    Hypertension    Liver spots     per pt   Lung nodule    Wears glasses    Wears partial dentures     ALLERGIES:  is allergic to crab [shellfish allergy] and morphine  and codeine.  MEDICATIONS:  Current Outpatient Medications  Medication Sig Dispense Refill   aspirin  EC 81 MG tablet Take 81 mg by mouth at bedtime. Swallow whole.     atorvastatin  (LIPITOR) 20 MG tablet Take 20 mg by mouth at bedtime.     folic acid  (FOLVITE ) 1 MG tablet Take 1 mg by mouth daily.     furosemide (LASIX) 20 MG tablet Take 20 mg by mouth daily.     latanoprost (XALATAN) 0.005 % ophthalmic solution Place 1 drop into both eyes at bedtime.     lisinopril  (ZESTRIL ) 10 MG tablet Take 1 tablet (10 mg total) by mouth every morning. 90 tablet 0   pregabalin  (LYRICA ) 150 MG capsule Take 150  mg by mouth 2 (two) times daily.     prochlorperazine  (COMPAZINE ) 10 MG tablet Take 1 tablet (10 mg total) by mouth every 6 (six) hours as needed for nausea or vomiting. 30 tablet 0   tiZANidine  (ZANAFLEX ) 2 MG tablet Take 2 mg by mouth at bedtime.     traMADol  HCl 100 MG TABS Take 1 tablet by mouth QID. 1 tablet by mouth every 6 hours as needed for pain     No current facility-administered medications for this visit.     SURGICAL HISTORY:  Past Surgical History:  Procedure Laterality Date   ABDOMINAL HYSTERECTOMY  1974   heavy bleeding   ABDOMINAL HYSTERECTOMY     BRONCHIAL BRUSHINGS  08/13/2019   Procedure: BRONCHIAL BRUSHINGS;  Surgeon: Brenna Adine CROME, DO;  Location: [facility];  Service: Pulmonary;;   BRONCHIAL WASHINGS  08/13/2019   Procedure: BRONCHIAL WASHINGS;  Surgeon: Brenna Adine CROME, DO;  Location: [facility];  Service: Pulmonary;;   COLONOSCOPY W/ BIOPSIES AND POLYPECTOMY     DILATION AND CURETTAGE OF UTERUS     FINE NEEDLE ASPIRATION  08/13/2019   Procedure: FINE NEEDLE ASPIRATION (FNA) LINEAR;  Surgeon: Brenna Adine CROME, DO;  Location: [facility];  Service: Pulmonary;;   FOOT SURGERY     bilateral bunions and hammer toes   IR RADIOLOGIST EVAL & MGMT  05/20/2022   LUNG BIOPSY  08/13/2019   Procedure: LUNG BIOPSY;  Surgeon: Brenna Adine CROME, DO;  Location: [facility];  Service: Pulmonary;;   MULTIPLE TOOTH EXTRACTIONS     VIDEO BRONCHOSCOPY WITH ENDOBRONCHIAL NAVIGATION Right 08/13/2019   Procedure: VIDEO BRONCHOSCOPY WITH ENDOBRONCHIAL NAVIGATION;  Surgeon: Brenna Adine CROME, DO;  Location: [facility];  Service: Pulmonary;  Laterality: Right;   VIDEO BRONCHOSCOPY WITH ENDOBRONCHIAL ULTRASOUND Right 08/13/2019   Procedure: VIDEO BRONCHOSCOPY WITH ENDOBRONCHIAL ULTRASOUND;  Surgeon: Brenna Adine CROME, DO;  Location: [facility];  Service: Pulmonary;  Laterality: Right;    REVIEW OF SYSTEMS:  A comprehensive review of systems was negative except for: Constitutional: positive for fatigue Musculoskeletal: positive for back pain   PHYSICAL EXAMINATION: General appearance: alert, cooperative, fatigued, and no distress Head: Normocephalic, without obvious abnormality, atraumatic Neck: no adenopathy, no JVD, supple, symmetrical, trachea midline, and thyroid  not enlarged, symmetric, no tenderness/mass/nodules Lymph nodes: Cervical, supraclavicular, and axillary nodes normal. Resp: clear  to auscultation bilaterally Back: symmetric, no curvature. ROM normal. No CVA tenderness. Cardio: regular rate and rhythm, S1, S2 normal, no murmur, click, rub or gallop GI: soft, non-tender; bowel sounds normal; no masses,  no organomegaly Extremities: extremities normal, atraumatic, no cyanosis or edema  ECOG PERFORMANCE STATUS: 1 - Symptomatic but completely ambulatory  Blood pressure 104/73, pulse 69, temperature 97.9 F (36.6 C), resp. rate 17, weight 165 lb (74.8 kg), SpO2 100%.    LABORATORY DATA: Lab Results  Component Value Date   WBC 10.0 12/25/2023   HGB 11.7 (L) 12/25/2023   HCT 36.2 12/25/2023   MCV 90.5 12/25/2023   PLT 182 12/25/2023      Chemistry      Component Value Date/Time   NA 143 12/04/2023 1417   NA 142 07/22/2019 1522   K 4.2 12/04/2023 1417   CL 111 12/04/2023 1417   CO2 26 12/04/2023 1417   BUN 22 12/04/2023 1417   BUN 13 07/22/2019 1522   CREATININE 1.37 (H) 12/04/2023 1417   CREATININE 0.91 09/16/2011 1000      Component Value Date/Time   CALCIUM  8.9 12/04/2023 1417   ALKPHOS  85 12/04/2023 1417   AST 26 12/04/2023 1417   ALT 16 12/04/2023 1417   BILITOT 0.6 12/04/2023 1417       RADIOGRAPHIC STUDIES: No results found.    ASSESSMENT AND PLAN: This is a pleasant 72 years old African-American female diagnosed with metastatic also lung cancer, adenocarcinoma initially diagnosed as stage IIb (T1b, N1, M0) non-small cell lung cancer, adenocarcinoma status post a right upper lobectomy with lymph node dissection on September 30, 2019 in [place] .  She declined adjuvant systemic chemotherapy at that time. She was found to have evidence for disease metastasis in November 2023 presenting with multiple metastatic bone lesions. The patient had molecular studies that showed an actionable mutation with BRAF V600E mutation and PD-L1 expression of 35%. She underwent palliative radiotherapy to the T9 vertebral lesion under the care of Dr. Dewey. The patient is  currently undergoing first-line combination of systemic chemotherapy with carboplatin  for AUC of 5, Alimta  500 Mg/M2 and Keytruda  200 Mg IV every 3 weeks.  Status post 29 cycles.  Starting from cycle #5 she will be on maintenance treatment with Alimta  and Keytruda  every 3 weeks.  Starting from cycle #5 she will be on single agent Keytruda  only.  Alimta  will be discontinued secondary to renal insufficiency. She has been tolerating this treatment fairly well except for some mild fatigue. Assessment and Plan Assessment & Plan Metastatic non-small cell lung cancer  Undergoing active treatment, starting cycle number thirty. Experiencing pain in the area of previous biopsy, managed with pain medication. 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 Pain is well-controlled with current medication regimen.  Emphysema Coughing with phlegm, consistent with emphysema. Drinking hot liquids helps alleviate symptoms.  Mild dehydration Feels dehydrated and acknowledges insufficient water intake. - Increase water intake She was advised to call immediately if she has any other concerning symptoms in the interval.  The patient voices understanding of current disease status and treatment options and is in agreement with the current care plan. The total time spent in the appointment was 20 minutes.  All questions were answered. The patient knows to call the clinic with any problems, questions or concerns. We can certainly see the patient much sooner if necessary.  Disclaimer: This note was dictated with voice recognition software. Similar sounding words can inadvertently be transcribed and may not be corrected upon review.

## 2023-12-28 ENCOUNTER — Other Ambulatory Visit: Payer: Self-pay

## 2024-01-06 ENCOUNTER — Other Ambulatory Visit: Payer: Self-pay

## 2024-01-08 ENCOUNTER — Ambulatory Visit (HOSPITAL_COMMUNITY)
Admission: RE | Admit: 2024-01-08 | Discharge: 2024-01-08 | Disposition: A | Discharge status: Home / Self Care | Source: Ambulatory Visit | Attending: Internal Medicine | Admitting: Internal Medicine

## 2024-01-08 DIAGNOSIS — C3490 Malignant neoplasm of unspecified part of unspecified bronchus or lung: Secondary | ICD-10-CM | POA: Insufficient documentation

## 2024-01-10 NOTE — Progress Notes (Unsigned)
 [facility] OFFICE PROGRESS NOTE  Brandi Crigler, MD [address] [place] [address]  DIAGNOSIS:  Metastatic non-small cell lung cancer, adenocarcinoma that was initially diagnosed as stage IIb (T1b, N1, M0) non-small cell lung cancer, adenocarcinoma presented with right upper lobe lung nodule   Detected Alteration(s) / Biomarker(s) Associated FDA-approved therapiesClinical Trial Availability% cfDNA or Amplification BRAF [id number] approved by FDA Dabrafenib+trametinib, Encorafenib+binimetinib approved in other indication Cobimetinib, Dabrafenib, Trametinib, Vemurafenib, Vemurafenib+cobimetinib Yes8.3%   BRCA1 R252S None (VUS) None (VUS) 0.1%     SMAD4 D420fs None None 7.2%   PD-L1 expression 35%  PRIOR THERAPY:  1) Status post right upper lobectomy with lymph node dissection on September 30, 2019 in [place] .  She declines adjuvant systemic chemotherapy. 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 Dewey.    CURRENT THERAPY: Systemic chemotherapy with carboplatin  for AUC of 5, Alimta  500 Mg/M2 and Keytruda  200 Mg IV every 3 weeks. First dose April 16, 2022. Status post 30 cycles. Starting cycle #5 she will be on maintenance treatment with Keytruda  every 3 weeks.   INTERVAL HISTORY: Brandi Holmes 73 y.o. female returns to the clinic for a follow-up visit. The patient is feeling fairly today without any concerning complaints.    She is currently undergoing maintenance treatment with immunotherapy with Keytruda . Alimta  ws discontinued due to CKD.  She has no issues with her recent immunotherapy treatment.   No fevers, chills, or night sweats. No new nausea, vomiting, diarrhea, or constipation. She denies rashes or skin changes. She denies changes with her breathing. She previously experienced constipation but managed it with medication and dietary  changes and she is no longer having trouble with this. She is prescribed lyrica  and tramadol  for chronic back pain since her surgery several years ago. The patient recently had a restaging CT scan.  She is here for evaluation and repeat blood work before undergoing cycle #31    MEDICAL HISTORY: Past Medical History:  Diagnosis Date   Anemia    as a child only   Coronary artery calcification of native artery    Hyperlipidemia    Hypertension    Liver spots     per pt   Lung nodule    Wears glasses    Wears partial dentures     ALLERGIES:  is allergic to crab [shellfish allergy] and morphine  and codeine.  MEDICATIONS:  Current Outpatient Medications  Medication Sig Dispense Refill   aspirin  EC 81 MG tablet Take 81 mg by mouth at bedtime. Swallow whole.     atorvastatin  (LIPITOR) 20 MG tablet Take 20 mg by mouth at bedtime.     folic acid  (FOLVITE ) 1 MG tablet Take 1 mg by mouth daily.     furosemide (LASIX) 20 MG tablet Take 20 mg by mouth daily.     lisinopril  (ZESTRIL ) 10 MG tablet Take 1 tablet (10 mg total) by mouth every morning. 90 tablet 0   pregabalin  (LYRICA ) 150 MG capsule Take 150 mg by mouth 2 (two) times daily.     tiZANidine  (ZANAFLEX ) 2 MG tablet Take 2 mg by mouth at bedtime.     traMADol  HCl 100 MG TABS Take 1 tablet by mouth QID. 1 tablet by mouth every 6 hours as needed for pain     prochlorperazine  (COMPAZINE ) 10 MG tablet Take 1 tablet (10 mg total) by mouth every 6 (  six) hours as needed for nausea or vomiting. (Patient not taking: Reported on 01/15/2024) 30 tablet 0   No current facility-administered medications for this visit.    SURGICAL HISTORY:  Past Surgical History:  Procedure Laterality Date   ABDOMINAL HYSTERECTOMY  1974   heavy bleeding   ABDOMINAL HYSTERECTOMY     BRONCHIAL BRUSHINGS  08/13/2019   Procedure: BRONCHIAL BRUSHINGS;  Surgeon: Brenna Adine CROME, DO;  Location: [facility];  Service: Pulmonary;;   BRONCHIAL WASHINGS  08/13/2019    Procedure: BRONCHIAL WASHINGS;  Surgeon: Brenna Adine CROME, DO;  Location: [facility];  Service: Pulmonary;;   COLONOSCOPY W/ BIOPSIES AND POLYPECTOMY     DILATION AND CURETTAGE OF UTERUS     FINE NEEDLE ASPIRATION  08/13/2019   Procedure: FINE NEEDLE ASPIRATION (FNA) LINEAR;  Surgeon: Brenna Adine CROME, DO;  Location: [facility];  Service: Pulmonary;;   FOOT SURGERY     bilateral bunions and hammer toes   IR RADIOLOGIST EVAL & MGMT  05/20/2022   LUNG BIOPSY  08/13/2019   Procedure: LUNG BIOPSY;  Surgeon: Brenna Adine CROME, DO;  Location: [facility];  Service: Pulmonary;;   MULTIPLE TOOTH EXTRACTIONS     VIDEO BRONCHOSCOPY WITH ENDOBRONCHIAL NAVIGATION Right 08/13/2019   Procedure: VIDEO BRONCHOSCOPY WITH ENDOBRONCHIAL NAVIGATION;  Surgeon: Brenna Adine CROME, DO;  Location: [facility];  Service: Pulmonary;  Laterality: Right;   VIDEO BRONCHOSCOPY WITH ENDOBRONCHIAL ULTRASOUND Right 08/13/2019   Procedure: VIDEO BRONCHOSCOPY WITH ENDOBRONCHIAL ULTRASOUND;  Surgeon: Brenna Adine CROME, DO;  Location: [facility];  Service: Pulmonary;  Laterality: Right;    REVIEW OF SYSTEMS:   Review of Systems  Constitutional: Stable fatigue. Negative for appetite change, chills, fever and unexpected weight change.  HENT: Negative for mouth sores, nosebleeds, sore throat and trouble swallowing.   Eyes: Negative for eye problems and icterus.  Respiratory: Positive for stable dyspnea on exertion and occasional cough. Negative for hemoptysis and wheezing.  Cardiovascular: Negative for chest pain and leg swelling.  Gastrointestinal: Negative for abdominal pain, constipation, diarrhea, nausea and vomiting.  Genitourinary: Negative for bladder incontinence, difficulty urinating, dysuria, frequency and hematuria.   Musculoskeletal: Positive for chronic back and right rib pain. Negative for back pain, gait problem, neck pain and neck stiffness.  Skin: Negative for itching and rash.  Neurological: Positive for gait  problem secondary to back pain. Negative for dizziness, extremity weakness, gait problem, headaches, light-headedness and seizures.  Hematological: Negative for adenopathy. Does not bruise/bleed easily.  Psychiatric/Behavioral: Negative for confusion, depression and sleep disturbance. The patient is not nervous/anxious.     PHYSICAL EXAMINATION:  Blood pressure (!) 141/70, pulse 70, temperature 97.7 F (36.5 C), temperature source Temporal, resp. rate 19, weight 169 lb 11.2 oz (77 kg), SpO2 99%.  ECOG PERFORMANCE STATUS: 1-2  Physical Exam  Constitutional: Oriented to person, place, and time and well-developed, well-nourished, and in no distress.   HENT:  Head: Normocephalic and atraumatic.  Mouth/Throat: Oropharynx is clear and moist. No oropharyngeal exudate.  Eyes: Conjunctivae are normal. Right eye exhibits no discharge. Left eye exhibits no discharge. No scleral icterus.  Neck: Normal range of motion. Neck supple.  Cardiovascular: Normal rate, regular rhythm, normal heart sounds and intact distal pulses.   Pulmonary/Chest: Effort normal and breath sounds normal. No respiratory distress. No wheezes. No rales.  Abdominal: Soft. Bowel sounds are normal. Exhibits no distension and no mass. There is no tenderness.  Musculoskeletal: Tenderness to palpation of upper back. Normal range of motion. Exhibits muscle wasting.  Lymphadenopathy:  No cervical adenopathy.  Neurological: Alert and oriented to person, place, and time. Exhibits muscle wasting. Ambulates stooped over due to back pain. Skin: Skin is warm and dry. No rash noted. Not diaphoretic. No erythema. No pallor.  Psychiatric: Mood, memory and judgment normal.  Vitals reviewed.  LABORATORY DATA: Lab Results  Component Value Date   WBC 10.1 01/15/2024   HGB 12.1 01/15/2024   HCT 38.1 01/15/2024   MCV 91.1 01/15/2024   PLT 184 01/15/2024      Chemistry      Component Value Date/Time   NA 142 01/15/2024 1432   NA 142  07/22/2019 1522   K 4.5 01/15/2024 1432   CL 110 01/15/2024 1432   CO2 29 01/15/2024 1432   BUN 24 (H) 01/15/2024 1432   BUN 13 07/22/2019 1522   CREATININE 1.30 (H) 01/15/2024 1432   CREATININE 0.91 09/16/2011 1000      Component Value Date/Time   CALCIUM  9.6 01/15/2024 1432   ALKPHOS 72 01/15/2024 1432   AST 24 01/15/2024 1432   ALT 15 01/15/2024 1432   BILITOT 0.5 01/15/2024 1432       RADIOGRAPHIC STUDIES:  CT CHEST ABDOMEN PELVIS WO CONTRAST Result Date: 01/12/2024 CLINICAL DATA:  Non-small-cell lung cancer restaging * Tracking Code: BO * EXAM: CT CHEST, ABDOMEN AND PELVIS WITHOUT CONTRAST TECHNIQUE: Multidetector CT imaging of the chest, abdomen and pelvis was performed following the standard protocol without IV contrast. RADIATION DOSE REDUCTION: This exam was performed according to the departmental dose-optimization program which includes automated exposure control, adjustment of the mA and/or kV according to patient size and/or use of iterative reconstruction technique. COMPARISON:  10/15/2023 FINDINGS: CT CHEST FINDINGS Cardiovascular: Aortic atherosclerosis. Normal heart size. Three-vessel coronary artery calcifications. Unchanged small pericardial effusion. Mediastinum/Nodes: No enlarged mediastinal, hilar, or axillary lymph nodes. Thyroid  gland, trachea, and esophagus demonstrate no significant findings. Lungs/Pleura: Unchanged postoperative/post treatment appearance of the right chest status post right upper lobectomy, again with fibrotic scarring and consolidation about the right lung base and a trace, loculated right pleural effusion. Likewise unchanged trace left pleural effusion. Musculoskeletal: No chest wall abnormality. No acute osseous findings. CT ABDOMEN PELVIS FINDINGS Hepatobiliary: No solid liver abnormality is seen. Multiple unchanged low-attenuation liver lesions, again incompletely characterized although presumed cysts or hemangiomata. No gallstones, gallbladder  wall thickening, or biliary dilatation. Pancreas: Unremarkable. No pancreatic ductal dilatation or surrounding inflammatory changes. Spleen: Normal in size without significant abnormality. Adrenals/Urinary Tract: Adrenal glands are unremarkable. Kidneys are normal, without renal calculi, solid lesion, or hydronephrosis. Bladder is unremarkable. Stomach/Bowel: Stomach is within normal limits. Appendix not clearly visualized. No evidence of bowel wall thickening, distention, or inflammatory changes. Vascular/Lymphatic: Aortic atherosclerosis. No enlarged abdominal or pelvic lymph nodes. Reproductive: Hysterectomy. Other: No abdominal wall hernia or abnormality. No ascites. Musculoskeletal: No acute osseous findings. Multiple unchanged sclerotic vertebral body metastases, again notable for chronic pathologic wedge deformities of T7 through T11. IMPRESSION: 1. Unchanged postoperative/post treatment appearance of the right chest status post right upper lobectomy, again with fibrotic scarring and consolidation about the right lung base and a trace, loculated right pleural effusion. 2. Multiple unchanged sclerotic vertebral body metastases, again notable for chronic pathologic wedge deformities of T7 through T11. 3. No noncontrast evidence of lymphadenopathy or soft tissue metastatic disease in the chest, abdomen, or pelvis. 4. Coronary artery disease. Aortic Atherosclerosis (ICD10-I70.0). Electronically Signed   By: Marolyn JONETTA Jaksch M.D.   On: 01/12/2024 12:48     ASSESSMENT/PLAN:  This is a very pleasant  73 year old African-American female diagnosed with metastatic non-small cell lung cancer, adenocarcinoma.  She was initially diagnosed as a stage IIb (T1b, N1, M0).  She is status post right upper lobectomy with lymph node dissection on September 30, 2019 in [place] .  She declined adjuvant systemic chemotherapy at that time.   She is found to have metastatic disease to the bone in November 2023.   Her molecular studies  show that she has BRAF V6 100 E mutation and a PD-L1 expression of 35%.   She underwent palliative radiation to T9 under the care of Dr. Dewey.   She is currently on systemic therapy.  She was started on systemic chemotherapy with carboplatin  for an AUC of 5, Alimta  500 mg/m, and Keytruda  200 mg IV every 3 weeks.  She is status post 30 cycles.  Starting from cycle #5, she started maintenance treatment with immunotherapy with Keytruda  only.  Alimta  was discontinued secondary to renal insufficiency.   The patient was seen with Dr. Sherrod today.  Dr. Sherrod personally and independently reviewed the scan and discussed results with the patient today.  The scan showed no evidence of disease progression.  Dr. Sherrod recommends she continue on the same treatment at the same dose  Labs were reviewed. Recommend that she proceed with cycle #31 today as schedule. She is ok to treat with a creatinine of  1.30   We will see her back for follow-up visit in 3 weeks for evaluation repeat blood work before undergoing cycle #32.   She will continue lyrica  and tramadol  for her chronic back pain. She also uses heating pad.   The patient was advised to call immediately if she has any concerning symptoms in the interval. The patient voices understanding of current disease status and treatment options and is in agreement with the current care plan. All questions were answered. The patient knows to call the clinic with any problems, questions or concerns. We can certainly see the patient much sooner if necessary     No orders of the defined types were placed in this encounter.   Midge Momon L Danila Eddie, PA-C 01/15/24  ADDENDUM: Hematology/Oncology Attending:  I had a face-to-face encounter with the patient today.  I reviewed her records, lab, scan and recommended her care plan.  This is a very pleasant 73 years old African-American female with metastatic non-small cell lung cancer, adenocarcinoma diagnosed  in December 2023 after initial diagnosis of stage IIb status post right upper lobectomy in June 2021 in [place] .  The patient started palliative systemic chemoimmunotherapy with carboplatin , Alimta  and Keytruda  every 3 weeks for 4 cycles and she has been on maintenance treatment with single agent Keytruda  status post 30 cycles.  She has been tolerating her treatment well with no concerning adverse effect except for the mild fatigue.  She also has intermittent back pain secondary to her previous bone metastasis in the thoracic spines. She had repeat CT scan of the chest, abdomen and pelvis performed recently.  I personally and independently reviewed the scan and discussed the result with the patient today.  Her scan showed no concerning findings for disease progression. I recommended for her to continue her current maintenance therapy and she will proceed with cycle #31 today. She will come back for follow-up visit in 3 weeks for reevaluation before the next cycle of her treatment. She was advised to call immediately if she has any concerning symptoms in the interval. The total time spent in the appointment was 30 minutes including review  of chart and various tests results, discussions about plan of care and coordination of care plan . Disclaimer: This note was dictated with voice recognition software. Similar sounding words can inadvertently be transcribed and may be missed upon review. Sherrod MARLA Sherrod, MD

## 2024-01-15 ENCOUNTER — Inpatient Hospital Stay (HOSPITAL_BASED_OUTPATIENT_CLINIC_OR_DEPARTMENT_OTHER): Admitting: Physician Assistant

## 2024-01-15 ENCOUNTER — Inpatient Hospital Stay

## 2024-01-15 ENCOUNTER — Encounter: Payer: Self-pay | Admitting: Internal Medicine

## 2024-01-15 ENCOUNTER — Inpatient Hospital Stay: Attending: Physician Assistant

## 2024-01-15 VITALS — BP 141/70 | HR 70 | Temp 97.7°F | Resp 19 | Wt 169.7 lb

## 2024-01-15 DIAGNOSIS — C7951 Secondary malignant neoplasm of bone: Secondary | ICD-10-CM

## 2024-01-15 DIAGNOSIS — Z923 Personal history of irradiation: Secondary | ICD-10-CM | POA: Insufficient documentation

## 2024-01-15 DIAGNOSIS — N189 Chronic kidney disease, unspecified: Secondary | ICD-10-CM | POA: Diagnosis not present

## 2024-01-15 DIAGNOSIS — R197 Diarrhea, unspecified: Secondary | ICD-10-CM | POA: Diagnosis not present

## 2024-01-15 DIAGNOSIS — M549 Dorsalgia, unspecified: Secondary | ICD-10-CM | POA: Insufficient documentation

## 2024-01-15 DIAGNOSIS — Z5112 Encounter for antineoplastic immunotherapy: Secondary | ICD-10-CM | POA: Diagnosis present

## 2024-01-15 DIAGNOSIS — Z902 Acquired absence of lung [part of]: Secondary | ICD-10-CM | POA: Diagnosis not present

## 2024-01-15 DIAGNOSIS — C3411 Malignant neoplasm of upper lobe, right bronchus or lung: Secondary | ICD-10-CM | POA: Insufficient documentation

## 2024-01-15 LAB — CMP (CANCER CENTER ONLY)
ALT: 15 U/L (ref 0–44)
AST: 24 U/L (ref 15–41)
Albumin: 3.8 g/dL (ref 3.5–5.0)
Alkaline Phosphatase: 72 U/L (ref 38–126)
Anion gap: 3 — ABNORMAL LOW (ref 5–15)
BUN: 24 mg/dL — ABNORMAL HIGH (ref 8–23)
CO2: 29 mmol/L (ref 22–32)
Calcium: 9.6 mg/dL (ref 8.9–10.3)
Chloride: 110 mmol/L (ref 98–111)
Creatinine: 1.3 mg/dL — ABNORMAL HIGH (ref 0.44–1.00)
GFR, Estimated: 43 mL/min — ABNORMAL LOW (ref >60)
Glucose, Bld: 86 mg/dL (ref 70–99)
Potassium: 4.5 mmol/L (ref 3.5–5.1)
Sodium: 142 mmol/L (ref 135–145)
Total Bilirubin: 0.5 mg/dL (ref 0.0–1.2)
Total Protein: 7 g/dL (ref 6.5–8.1)

## 2024-01-15 LAB — CBC WITH DIFFERENTIAL (CANCER CENTER ONLY)
Abs Immature Granulocytes: 0.02 K/uL (ref 0.00–0.07)
Basophils Absolute: 0.1 K/uL (ref 0.0–0.1)
Basophils Relative: 1 %
Eosinophils Absolute: 2.2 K/uL — ABNORMAL HIGH (ref 0.0–0.5)
Eosinophils Relative: 22 %
HCT: 38.1 % (ref 36.0–46.0)
Hemoglobin: 12.1 g/dL (ref 12.0–15.0)
Immature Granulocytes: 0 %
Lymphocytes Relative: 23 %
Lymphs Abs: 2.3 K/uL (ref 0.7–4.0)
MCH: 28.9 pg (ref 26.0–34.0)
MCHC: 31.8 g/dL (ref 30.0–36.0)
MCV: 91.1 fL (ref 80.0–100.0)
Monocytes Absolute: 0.8 K/uL (ref 0.1–1.0)
Monocytes Relative: 8 %
Neutro Abs: 4.7 K/uL (ref 1.7–7.7)
Neutrophils Relative %: 46 %
Platelet Count: 184 K/uL (ref 150–400)
RBC: 4.18 MIL/uL (ref 3.87–5.11)
RDW: 13.1 % (ref 11.5–15.5)
WBC Count: 10.1 K/uL (ref 4.0–10.5)
nRBC: 0 % (ref 0.0–0.2)

## 2024-01-15 MED ORDER — SODIUM CHLORIDE 0.9 % IV SOLN
200.0000 mg | Freq: Once | INTRAVENOUS | Status: AC
  Administered 2024-01-15: 200 mg via INTRAVENOUS
  Filled 2024-01-15: qty 200

## 2024-01-15 MED ORDER — SODIUM CHLORIDE 0.9 % IV SOLN: Freq: Once | INTRAVENOUS | Status: AC

## 2024-01-15 NOTE — Patient Instructions (Signed)
 [facility] - A DEPT OF [facility]  Discharge Instructions: Thank you for choosing [facility] to provide your oncology and hematology care.   If you have a lab appointment with the Cancer Center, please go directly to the Cancer Center and check in at the registration area.   Wear comfortable clothing and clothing appropriate for easy access to any Portacath or PICC line.   We strive to give you quality time with your provider. You may need to reschedule your appointment if you arrive late (15 or more minutes).  Arriving late affects you and other patients whose appointments are after yours.  Also, if you miss three or more appointments without notifying the office, you may be dismissed from the clinic at the provider's discretion.      For prescription refill requests, have your pharmacy contact our office and allow 72 hours for refills to be completed.    Today you received the following chemotherapy and/or immunotherapy agent: Pembrolizumab (Keytruda)      To help prevent nausea and vomiting after your treatment, we encourage you to take your nausea medication as directed.  BELOW ARE SYMPTOMS THAT SHOULD BE REPORTED IMMEDIATELY: *FEVER GREATER THAN 100.4 F (38 C) OR HIGHER *CHILLS OR SWEATING *NAUSEA AND VOMITING THAT IS NOT CONTROLLED WITH YOUR NAUSEA MEDICATION *UNUSUAL SHORTNESS OF BREATH *UNUSUAL BRUISING OR BLEEDING *URINARY PROBLEMS (pain or burning when urinating, or frequent urination) *BOWEL PROBLEMS (unusual diarrhea, constipation, pain near the anus) TENDERNESS IN MOUTH AND THROAT WITH OR WITHOUT PRESENCE OF ULCERS (sore throat, sores in mouth, or a toothache) UNUSUAL RASH, SWELLING OR PAIN  UNUSUAL VAGINAL DISCHARGE OR ITCHING   Items with * indicate a potential emergency and should be followed up as soon as possible or go to the Emergency Department if any problems should occur.  Please show the CHEMOTHERAPY ALERT CARD or  IMMUNOTHERAPY ALERT CARD at check-in to the Emergency Department and triage nurse.  Should you have questions after your visit or need to cancel or reschedule your appointment, please contact [facility] - [facility]  Dept: [phone]  and follow the prompts.  Office hours are 8:00 a.m. to 4:30 p.m. Monday - Friday. Please note that voicemails left after 4:00 p.m. may not be returned until the following business day.  We are closed weekends and major holidays. You have access to a nurse at all times for urgent questions. Please call the main number to the clinic Dept: [phone] and follow the prompts.   For any non-urgent questions, you may also contact your provider using MyChart. We now offer e-Visits for anyone 3 and older to request care online for non-urgent symptoms. For details visit [email or web address].   Also download the MyChart app! Go to the app store, search "MyChart", open the app, select [facility], and log in with your MyChart username and password. Pembrolizumab Injection What is this medication? PEMBROLIZUMAB (PEM broe LIZ ue mab) treats some types of cancer. It works by helping your immune system slow or stop the spread of cancer cells. It is a monoclonal antibody. This medicine may be used for other purposes; ask your health care provider or pharmacist if you have questions. COMMON BRAND NAME(S): Keytruda What should I tell my care team before I take this medication? They need to know if you have any of these conditions: Allogeneic stem cell transplant (uses someone else's stem cells)  Autoimmune diseases, such as Crohn disease, ulcerative colitis, lupus History of chest radiation Nervous system problems, such as Guillain-Barre syndrome, myasthenia gravis Organ transplant An unusual or allergic reaction to pembrolizumab, other medications, foods, dyes, or preservatives Pregnant or trying to get pregnant Breast-feeding How  should I use this medication? This medication is injected into a vein. It is given by your care team in a hospital or clinic setting. A special MedGuide will be given to you before each treatment. Be sure to read this information carefully each time. Talk to your care team about the use of this medication in children. While it may be prescribed for children as young as 6 months for selected conditions, precautions do apply. Overdosage: If you think you have taken too much of this medicine contact a poison control center or emergency room at once. NOTE: This medicine is only for you. Do not share this medicine with others. What if I miss a dose? Keep appointments for follow-up doses. It is important not to miss your dose. Call your care team if you are unable to keep an appointment. What may interact with this medication? Interactions have not been studied. This list may not describe all possible interactions. Give your health care provider a list of all the medicines, herbs, non-prescription drugs, or dietary supplements you use. Also tell them if you smoke, drink alcohol, or use illegal drugs. Some items may interact with your medicine. What should I watch for while using this medication? Your condition will be monitored carefully while you are receiving this medication. You may need blood work while taking this medication. This medication may cause serious skin reactions. They can happen weeks to months after starting the medication. Contact your care team right away if you notice fevers or flu-like symptoms with a rash. The rash may be red or purple and then turn into blisters or peeling of the skin. You may also notice a red rash with swelling of the face, lips, or lymph nodes in your neck or under your arms. Tell your care team right away if you have any change in your eyesight. Talk to your care team if you may be pregnant. Serious birth defects can occur if you take this medication during  pregnancy and for 4 months after the last dose. You will need a negative pregnancy test before starting this medication. Contraception is recommended while taking this medication and for 4 months after the last dose. Your care team can help you find the option that works for you. Do not breastfeed while taking this medication and for 4 months after the last dose. What side effects may I notice from receiving this medication? Side effects that you should report to your care team as soon as possible: Allergic reactions--skin rash, itching, hives, swelling of the face, lips, tongue, or throat Dry cough, shortness of breath or trouble breathing Eye pain, redness, irritation, or discharge with blurry or decreased vision Heart muscle inflammation--unusual weakness or fatigue, shortness of breath, chest pain, fast or irregular heartbeat, dizziness, swelling of the ankles, feet, or hands Hormone gland problems--headache, sensitivity to light, unusual weakness or fatigue, dizziness, fast or irregular heartbeat, increased sensitivity to cold or heat, excessive sweating, constipation, hair loss, increased thirst or amount of urine, tremors or shaking, irritability Infusion reactions--chest pain, shortness of breath or trouble breathing, feeling faint or lightheaded Kidney injury (glomerulonephritis)--decrease in the amount of urine, red or dark brown urine, foamy or bubbly urine, swelling of the ankles, hands, or feet  Liver injury--right upper belly pain, loss of appetite, nausea, light-colored stool, dark yellow or brown urine, yellowing skin or eyes, unusual weakness or fatigue Pain, tingling, or numbness in the hands or feet, muscle weakness, change in vision, confusion or trouble speaking, loss of balance or coordination, trouble walking, seizures Rash, fever, and swollen lymph nodes Redness, blistering, peeling, or loosening of the skin, including inside the mouth Sudden or severe stomach pain, bloody  diarrhea, fever, nausea, vomiting Side effects that usually do not require medical attention (report to your care team if they continue or are bothersome): Bone, joint, or muscle pain Diarrhea Fatigue Loss of appetite Nausea Skin rash This list may not describe all possible side effects. Call your doctor for medical advice about side effects. You may report side effects to FDA at 1-800-FDA-1088. Where should I keep my medication? This medication is given in a hospital or clinic. It will not be stored at home. NOTE: This sheet is a summary. It may not cover all possible information. If you have questions about this medicine, talk to your doctor, pharmacist, or health care provider.  2024 Elsevier/Gold Standard (2021-08-14 00:00:00)

## 2024-02-05 ENCOUNTER — Telehealth: Payer: Self-pay

## 2024-02-05 ENCOUNTER — Inpatient Hospital Stay

## 2024-02-05 ENCOUNTER — Inpatient Hospital Stay (HOSPITAL_BASED_OUTPATIENT_CLINIC_OR_DEPARTMENT_OTHER): Admitting: Internal Medicine

## 2024-02-05 VITALS — BP 105/57 | HR 82 | Temp 98.0°F | Resp 18

## 2024-02-05 VITALS — BP 138/76 | HR 75 | Temp 98.0°F | Resp 17 | Ht 63.0 in | Wt 169.0 lb

## 2024-02-05 DIAGNOSIS — C7951 Secondary malignant neoplasm of bone: Secondary | ICD-10-CM

## 2024-02-05 DIAGNOSIS — C3411 Malignant neoplasm of upper lobe, right bronchus or lung: Secondary | ICD-10-CM

## 2024-02-05 DIAGNOSIS — Z5112 Encounter for antineoplastic immunotherapy: Secondary | ICD-10-CM | POA: Diagnosis not present

## 2024-02-05 LAB — CBC WITH DIFFERENTIAL (CANCER CENTER ONLY)
Abs Immature Granulocytes: 0.02 K/uL (ref 0.00–0.07)
Basophils Absolute: 0.1 K/uL (ref 0.0–0.1)
Basophils Relative: 1 %
Eosinophils Absolute: 2.4 K/uL — ABNORMAL HIGH (ref 0.0–0.5)
Eosinophils Relative: 24 %
HCT: 41.1 % (ref 36.0–46.0)
Hemoglobin: 13.4 g/dL (ref 12.0–15.0)
Immature Granulocytes: 0 %
Lymphocytes Relative: 20 %
Lymphs Abs: 2 K/uL (ref 0.7–4.0)
MCH: 29.4 pg (ref 26.0–34.0)
MCHC: 32.6 g/dL (ref 30.0–36.0)
MCV: 90.1 fL (ref 80.0–100.0)
Monocytes Absolute: 0.8 K/uL (ref 0.1–1.0)
Monocytes Relative: 8 %
Neutro Abs: 4.9 K/uL (ref 1.7–7.7)
Neutrophils Relative %: 47 %
Platelet Count: 196 K/uL (ref 150–400)
RBC: 4.56 MIL/uL (ref 3.87–5.11)
RDW: 13.1 % (ref 11.5–15.5)
WBC Count: 10.2 K/uL (ref 4.0–10.5)
nRBC: 0 % (ref 0.0–0.2)

## 2024-02-05 LAB — CMP (CANCER CENTER ONLY)
ALT: 15 U/L (ref 0–44)
AST: 26 U/L (ref 15–41)
Albumin: 3.9 g/dL (ref 3.5–5.0)
Alkaline Phosphatase: 94 U/L (ref 38–126)
Anion gap: 6 (ref 5–15)
BUN: 26 mg/dL — ABNORMAL HIGH (ref 8–23)
CO2: 28 mmol/L (ref 22–32)
Calcium: 9.4 mg/dL (ref 8.9–10.3)
Chloride: 108 mmol/L (ref 98–111)
Creatinine: 1.66 mg/dL — ABNORMAL HIGH (ref 0.44–1.00)
GFR, Estimated: 32 mL/min — ABNORMAL LOW (ref >60)
Glucose, Bld: 98 mg/dL (ref 70–99)
Potassium: 4.6 mmol/L (ref 3.5–5.1)
Sodium: 142 mmol/L (ref 135–145)
Total Bilirubin: 0.7 mg/dL (ref 0.0–1.2)
Total Protein: 7.1 g/dL (ref 6.5–8.1)

## 2024-02-05 MED ORDER — SODIUM CHLORIDE 0.9 % IV SOLN
200.0000 mg | Freq: Once | INTRAVENOUS | Status: AC
  Administered 2024-02-05: 200 mg via INTRAVENOUS
  Filled 2024-02-05: qty 200

## 2024-02-05 MED ORDER — SODIUM CHLORIDE 0.9 % IV SOLN: Freq: Once | INTRAVENOUS | Status: AC

## 2024-02-05 NOTE — Telephone Encounter (Signed)
 Per Dr. Sherrod - okay to proceed with treatment with creatinine of 1.66.

## 2024-02-05 NOTE — Progress Notes (Signed)
 [facility] Telephone:([phone]   Fax:([phone]) [phone]  OFFICE PROGRESS NOTE  Brandi Crigler, MD [address] [place] [address]  DIAGNOSIS: Metastatic non-small cell lung cancer, adenocarcinoma that was initially diagnosed as stage IIb (T1b, N1, M0) non-small cell lung cancer, adenocarcinoma presented with right upper lobe lung nodule in June 2021.   Detected Alteration(s) / Biomarker(s) Associated FDA-approved therapies Clinical Trial Availability % cfDNA or Amplification BRAF V600E approved by FDA Dabrafenib+trametinib, Encorafenib+binimetinib approved in other indication Cobimetinib, Dabrafenib, Trametinib, Vemurafenib, Vemurafenib+cobimetinib Yes 8.3%  BRCA1 R252S None (VUS) None (VUS) 0.1%   SMAD4 D452fs None None 7.2%  PD-L1 expression 35%  PRIOR THERAPY:  1) Status post right upper lobectomy with lymph node dissection on September 30, 2019 in [place] .  She declines adjuvant systemic chemotherapy. 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 Dewey.  CURRENT THERAPY: Systemic chemotherapy with carboplatin  for AUC of 5, Alimta  500 Mg/M2 and Keytruda  200 Mg IV every 3 weeks.  First dose April 16, 2022.  Status post 31  cycles.  Starting cycle #5 she will be on maintenance treatment with Keytruda  every 3 weeks.  INTERVAL HISTORY: Brandi Holmes 73 y.o. female returns to the clinic today for follow-up visit. Discussed the use of AI scribe software for clinical note transcription with the patient, who gave verbal consent to proceed.  History of Present Illness Brandi Holmes is a 73 year old female with metastatic non-small cell lung cancer who presents for evaluation before starting cycle number thirty-two of chemotherapy.  She has metastatic non-small cell lung cancer, adenocarcinoma, initially diagnosed as stage  II B in June 2021. She has been undergoing systemic chemoimmunotherapy with carboplatin , pemetrexed , and pembrolizumab  every three weeks and is currently on cycle number thirty-two. Molecular studies revealed a BRAF V600E mutation and PD-L1 expression of 35%.  She experiences severe pain in her midriff, describing it as 'hurting so bad' and 'tight'. The pain is located around her rib cage and radiates to her back, making it difficult for her to stand or walk. No abdominal pain is noted, but her rib cage hurts when she steps. She has a history of bone metastasis, previously treated.  She experienced diarrhea last night, going to the bathroom four times with no sleep. She describes the odor as metallic, 'like iron', and different from usual. She frequently eats cabbage to prevent constipation but notes this episode was different. No nausea or vomiting is reported.  Her sleep was interrupted due to diarrhea, and she has been up all night. No cough is reported, and she denies any abdominal pain, focusing on the rib cage and back pain.  She is concerned about her kidney function, noting that her serum creatinine improved from 1.6 to 1.3, and her GFR increased from 33 to 43.    MEDICAL HISTORY: Past Medical History:  Diagnosis Date   Anemia    as a child only   Coronary artery calcification of native artery    Hyperlipidemia    Hypertension    Liver spots     per pt   Lung nodule    Wears glasses    Wears partial dentures     ALLERGIES:  is allergic to crab [shellfish allergy] and morphine  and codeine.  MEDICATIONS:  Current Outpatient Medications  Medication Sig Dispense Refill   aspirin  EC 81 MG  tablet Take 81 mg by mouth at bedtime. Swallow whole.     atorvastatin  (LIPITOR) 20 MG tablet Take 20 mg by mouth at bedtime.     folic acid  (FOLVITE ) 1 MG tablet Take 1 mg by mouth daily.     furosemide (LASIX) 20 MG tablet Take 20 mg by mouth daily.     lisinopril  (ZESTRIL ) 10 MG tablet  Take 1 tablet (10 mg total) by mouth every morning. 90 tablet 0   pregabalin  (LYRICA ) 150 MG capsule Take 150 mg by mouth 2 (two) times daily.     prochlorperazine  (COMPAZINE ) 10 MG tablet Take 1 tablet (10 mg total) by mouth every 6 (six) hours as needed for nausea or vomiting. (Patient not taking: Reported on 01/15/2024) 30 tablet 0   tiZANidine  (ZANAFLEX ) 2 MG tablet Take 2 mg by mouth at bedtime.     traMADol  HCl 100 MG TABS Take 1 tablet by mouth QID. 1 tablet by mouth every 6 hours as needed for pain     No current facility-administered medications for this visit.    SURGICAL HISTORY:  Past Surgical History:  Procedure Laterality Date   ABDOMINAL HYSTERECTOMY  1974   heavy bleeding   ABDOMINAL HYSTERECTOMY     BRONCHIAL BRUSHINGS  08/13/2019   Procedure: BRONCHIAL BRUSHINGS;  Surgeon: Brenna Adine CROME, DO;  Location: [facility];  Service: Pulmonary;;   BRONCHIAL WASHINGS  08/13/2019   Procedure: BRONCHIAL WASHINGS;  Surgeon: Brenna Adine CROME, DO;  Location: [facility];  Service: Pulmonary;;   COLONOSCOPY W/ BIOPSIES AND POLYPECTOMY     DILATION AND CURETTAGE OF UTERUS     FINE NEEDLE ASPIRATION  08/13/2019   Procedure: FINE NEEDLE ASPIRATION (FNA) LINEAR;  Surgeon: Brenna Adine CROME, DO;  Location: [facility];  Service: Pulmonary;;   FOOT SURGERY     bilateral bunions and hammer toes   IR RADIOLOGIST EVAL & MGMT  05/20/2022   LUNG BIOPSY  08/13/2019   Procedure: LUNG BIOPSY;  Surgeon: Brenna Adine CROME, DO;  Location: [facility];  Service: Pulmonary;;   MULTIPLE TOOTH EXTRACTIONS     VIDEO BRONCHOSCOPY WITH ENDOBRONCHIAL NAVIGATION Right 08/13/2019   Procedure: VIDEO BRONCHOSCOPY WITH ENDOBRONCHIAL NAVIGATION;  Surgeon: Brenna Adine CROME, DO;  Location: [facility];  Service: Pulmonary;  Laterality: Right;   VIDEO BRONCHOSCOPY WITH ENDOBRONCHIAL ULTRASOUND Right 08/13/2019   Procedure: VIDEO BRONCHOSCOPY WITH ENDOBRONCHIAL ULTRASOUND;  Surgeon: Brenna Adine CROME, DO;  Location: [facility];  Service: Pulmonary;  Laterality: Right;    REVIEW OF SYSTEMS:  A comprehensive review of systems was negative except for: Constitutional: positive for fatigue Gastrointestinal: positive for diarrhea Musculoskeletal: positive for back pain   PHYSICAL EXAMINATION: General appearance: alert, cooperative, fatigued, and no distress Head: Normocephalic, without obvious abnormality, atraumatic Neck: no adenopathy, no JVD, supple, symmetrical, trachea midline, and thyroid  not enlarged, symmetric, no tenderness/mass/nodules Lymph nodes: Cervical, supraclavicular, and axillary nodes normal. Resp: clear to auscultation bilaterally Back: symmetric, no curvature. ROM normal. No CVA tenderness. Cardio: regular rate and rhythm, S1, S2 normal, no murmur, click, rub or gallop GI: soft, non-tender; bowel sounds normal; no masses,  no organomegaly Extremities: extremities normal, atraumatic, no cyanosis or edema  ECOG PERFORMANCE STATUS: 1 - Symptomatic but completely ambulatory  Blood pressure 138/76, pulse 75, temperature 98 F (36.7 C), temperature source Temporal, resp. rate 17, height 5' 3 (1.6 m), weight 169 lb (76.7 kg), SpO2 100%.    LABORATORY DATA: Lab Results  Component Value Date   WBC 10.1 01/15/2024  HGB 12.1 01/15/2024   HCT 38.1 01/15/2024   MCV 91.1 01/15/2024   PLT 184 01/15/2024      Chemistry      Component Value Date/Time   NA 142 01/15/2024 1432   NA 142 07/22/2019 1522   K 4.5 01/15/2024 1432   CL 110 01/15/2024 1432   CO2 29 01/15/2024 1432   BUN 24 (H) 01/15/2024 1432   BUN 13 07/22/2019 1522   CREATININE 1.30 (H) 01/15/2024 1432   CREATININE 0.91 09/16/2011 1000      Component Value Date/Time   CALCIUM  9.6 01/15/2024 1432   ALKPHOS 72 01/15/2024 1432   AST 24 01/15/2024 1432   ALT 15 01/15/2024 1432   BILITOT 0.5 01/15/2024 1432       RADIOGRAPHIC STUDIES: CT CHEST ABDOMEN PELVIS WO CONTRAST Result Date: 01/12/2024 CLINICAL DATA:   Non-small-cell lung cancer restaging * Tracking Code: BO * EXAM: CT CHEST, ABDOMEN AND PELVIS WITHOUT CONTRAST TECHNIQUE: Multidetector CT imaging of the chest, abdomen and pelvis was performed following the standard protocol without IV contrast. RADIATION DOSE REDUCTION: This exam was performed according to the departmental dose-optimization program which includes automated exposure control, adjustment of the mA and/or kV according to patient size and/or use of iterative reconstruction technique. COMPARISON:  10/15/2023 FINDINGS: CT CHEST FINDINGS Cardiovascular: Aortic atherosclerosis. Normal heart size. Three-vessel coronary artery calcifications. Unchanged small pericardial effusion. Mediastinum/Nodes: No enlarged mediastinal, hilar, or axillary lymph nodes. Thyroid  gland, trachea, and esophagus demonstrate no significant findings. Lungs/Pleura: Unchanged postoperative/post treatment appearance of the right chest status post right upper lobectomy, again with fibrotic scarring and consolidation about the right lung base and a trace, loculated right pleural effusion. Likewise unchanged trace left pleural effusion. Musculoskeletal: No chest wall abnormality. No acute osseous findings. CT ABDOMEN PELVIS FINDINGS Hepatobiliary: No solid liver abnormality is seen. Multiple unchanged low-attenuation liver lesions, again incompletely characterized although presumed cysts or hemangiomata. No gallstones, gallbladder wall thickening, or biliary dilatation. Pancreas: Unremarkable. No pancreatic ductal dilatation or surrounding inflammatory changes. Spleen: Normal in size without significant abnormality. Adrenals/Urinary Tract: Adrenal glands are unremarkable. Kidneys are normal, without renal calculi, solid lesion, or hydronephrosis. Bladder is unremarkable. Stomach/Bowel: Stomach is within normal limits. Appendix not clearly visualized. No evidence of bowel wall thickening, distention, or inflammatory changes.  Vascular/Lymphatic: Aortic atherosclerosis. No enlarged abdominal or pelvic lymph nodes. Reproductive: Hysterectomy. Other: No abdominal wall hernia or abnormality. No ascites. Musculoskeletal: No acute osseous findings. Multiple unchanged sclerotic vertebral body metastases, again notable for chronic pathologic wedge deformities of T7 through T11. IMPRESSION: 1. Unchanged postoperative/post treatment appearance of the right chest status post right upper lobectomy, again with fibrotic scarring and consolidation about the right lung base and a trace, loculated right pleural effusion. 2. Multiple unchanged sclerotic vertebral body metastases, again notable for chronic pathologic wedge deformities of T7 through T11. 3. No noncontrast evidence of lymphadenopathy or soft tissue metastatic disease in the chest, abdomen, or pelvis. 4. Coronary artery disease. Aortic Atherosclerosis (ICD10-I70.0). Electronically Signed   By: Marolyn JONETTA Jaksch M.D.   On: 01/12/2024 12:48      ASSESSMENT AND PLAN: This is a pleasant 73 years old African-American female diagnosed with metastatic also lung cancer, adenocarcinoma initially diagnosed as stage IIb (T1b, N1, M0) non-small cell lung cancer, adenocarcinoma status post a right upper lobectomy with lymph node dissection on September 30, 2019 in [place] .  She declined adjuvant systemic chemotherapy at that time. She was found to have evidence for disease metastasis in November 2023 presenting with  multiple metastatic bone lesions. The patient had molecular studies that showed an actionable mutation with BRAF V600E mutation and PD-L1 expression of 35%. She underwent palliative radiotherapy to the T9 vertebral lesion under the care of Dr. Dewey. The patient is currently undergoing first-line combination of systemic chemotherapy with carboplatin  for AUC of 5, Alimta  500 Mg/M2 and Keytruda  200 Mg IV every 3 weeks.  Status post 31 cycles.  Starting from cycle #5 she will be on maintenance  treatment with Alimta  and Keytruda  every 3 weeks.  Starting from cycle #5 she will be on single agent Keytruda  only.  Alimta  will be discontinued secondary to renal insufficiency. She has been tolerating this treatment fairly well except for some mild fatigue and this morning she had some diarrhea and right-sided rib cage pain.Assessment and Plan Assessment & Plan Metastatic non-small cell lung cancer, adenocarcinoma with bone metastasis Metastatic non-small cell lung cancer, adenocarcinoma initially diagnosed as stage two B in June 2021 with evidence of metastasis. Currently undergoing systemic chemoimmunotherapy with carboplatin , pemetrexed , and pembrolizumab  every three weeks and currently on single agent pembrolizumab . Molecular studies show BRAF V600E mutation and PD-L1 expression of 35%. Recent scans show no new growth of bone metastasis. She wishes to continue treatment despite current symptoms. - Administer cycle 32 of pembrolizumab  - Advise to contact clinic or go to ED if feeling unwell  Rib cage and back pain Severe rib cage and back pain, exacerbated by movement, with no abdominal pain. No recent cough or respiratory symptoms. Pain described as tightness and pulling sensation. - Advise to reduce cabbage intake to decrease gas production - Advise to go to ED if pain persists or worsens  Diarrhea Acute onset of diarrhea with metallic odor, occurring four times overnight, causing sleep interruption. No associated nausea or vomiting. Possible dietary cause considered, but she reports this episode as different from usual dietary effects.  Chronic kidney disease Chronic kidney disease with recent improvement in kidney function. Serum creatinine improved from 1.6 to 1.3. GFR improved from 33 to 43. She was advised to call immediately if she has any other concerning symptoms in the interval.    The patient voices understanding of current disease status and treatment options and is in  agreement with the current care plan. The total time spent in the appointment was 20 minutes.  All questions were answered. The patient knows to call the clinic with any problems, questions or concerns. We can certainly see the patient much sooner if necessary.  Disclaimer: This note was dictated with voice recognition software. Similar sounding words can inadvertently be transcribed and may not be corrected upon review.

## 2024-02-10 ENCOUNTER — Other Ambulatory Visit: Payer: Self-pay

## 2024-02-16 ENCOUNTER — Other Ambulatory Visit: Payer: Self-pay

## 2024-02-26 ENCOUNTER — Inpatient Hospital Stay: Admitting: Internal Medicine

## 2024-02-26 ENCOUNTER — Encounter: Payer: Self-pay | Admitting: Internal Medicine

## 2024-02-26 ENCOUNTER — Inpatient Hospital Stay

## 2024-02-26 ENCOUNTER — Inpatient Hospital Stay: Attending: Physician Assistant

## 2024-02-26 VITALS — BP 112/62 | HR 78 | Temp 98.0°F | Resp 17 | Ht 63.0 in | Wt 174.6 lb

## 2024-02-26 DIAGNOSIS — Z902 Acquired absence of lung [part of]: Secondary | ICD-10-CM | POA: Diagnosis not present

## 2024-02-26 DIAGNOSIS — C7951 Secondary malignant neoplasm of bone: Secondary | ICD-10-CM

## 2024-02-26 DIAGNOSIS — Z5112 Encounter for antineoplastic immunotherapy: Secondary | ICD-10-CM | POA: Insufficient documentation

## 2024-02-26 DIAGNOSIS — C3411 Malignant neoplasm of upper lobe, right bronchus or lung: Secondary | ICD-10-CM | POA: Diagnosis not present

## 2024-02-26 DIAGNOSIS — J439 Emphysema, unspecified: Secondary | ICD-10-CM | POA: Diagnosis not present

## 2024-02-26 DIAGNOSIS — Z923 Personal history of irradiation: Secondary | ICD-10-CM | POA: Insufficient documentation

## 2024-02-26 DIAGNOSIS — N183 Chronic kidney disease, stage 3 unspecified: Secondary | ICD-10-CM | POA: Insufficient documentation

## 2024-02-26 LAB — CBC WITH DIFFERENTIAL (CANCER CENTER ONLY)
Abs Immature Granulocytes: 0.02 K/uL (ref 0.00–0.07)
Basophils Absolute: 0.1 K/uL (ref 0.0–0.1)
Basophils Relative: 1 %
Eosinophils Absolute: 1.9 K/uL — ABNORMAL HIGH (ref 0.0–0.5)
Eosinophils Relative: 18 %
HCT: 37.8 % (ref 36.0–46.0)
Hemoglobin: 12.4 g/dL (ref 12.0–15.0)
Immature Granulocytes: 0 %
Lymphocytes Relative: 18 %
Lymphs Abs: 1.9 K/uL (ref 0.7–4.0)
MCH: 29.5 pg (ref 26.0–34.0)
MCHC: 32.8 g/dL (ref 30.0–36.0)
MCV: 89.8 fL (ref 80.0–100.0)
Monocytes Absolute: 0.7 K/uL (ref 0.1–1.0)
Monocytes Relative: 7 %
Neutro Abs: 6 K/uL (ref 1.7–7.7)
Neutrophils Relative %: 56 %
Platelet Count: 200 K/uL (ref 150–400)
RBC: 4.21 MIL/uL (ref 3.87–5.11)
RDW: 13.1 % (ref 11.5–15.5)
WBC Count: 10.7 K/uL — ABNORMAL HIGH (ref 4.0–10.5)
nRBC: 0 % (ref 0.0–0.2)

## 2024-02-26 LAB — CMP (CANCER CENTER ONLY)
ALT: 12 U/L (ref 0–44)
AST: 23 U/L (ref 15–41)
Albumin: 3.7 g/dL (ref 3.5–5.0)
Alkaline Phosphatase: 79 U/L (ref 38–126)
Anion gap: 6 (ref 5–15)
BUN: 22 mg/dL (ref 8–23)
CO2: 26 mmol/L (ref 22–32)
Calcium: 9.1 mg/dL (ref 8.9–10.3)
Chloride: 110 mmol/L (ref 98–111)
Creatinine: 1.34 mg/dL — ABNORMAL HIGH (ref 0.44–1.00)
GFR, Estimated: 41 mL/min — ABNORMAL LOW (ref >60)
Glucose, Bld: 88 mg/dL (ref 70–99)
Potassium: 4.6 mmol/L (ref 3.5–5.1)
Sodium: 142 mmol/L (ref 135–145)
Total Bilirubin: 0.7 mg/dL (ref 0.0–1.2)
Total Protein: 6.7 g/dL (ref 6.5–8.1)

## 2024-02-26 MED ORDER — SODIUM CHLORIDE 0.9 % IV SOLN: Freq: Once | INTRAVENOUS | Status: AC

## 2024-02-26 MED ORDER — SODIUM CHLORIDE 0.9 % IV SOLN
200.0000 mg | Freq: Once | INTRAVENOUS | Status: AC
  Administered 2024-02-26: 200 mg via INTRAVENOUS
  Filled 2024-02-26: qty 200

## 2024-02-26 NOTE — Patient Instructions (Signed)
 [facility] - A DEPT OF [facility]  Discharge Instructions: Thank you for choosing [facility] to provide your oncology and hematology care.   If you have a lab appointment with the Cancer Center, please go directly to the Cancer Center and check in at the registration area.   Wear comfortable clothing and clothing appropriate for easy access to any Portacath or PICC line.   We strive to give you quality time with your provider. You may need to reschedule your appointment if you arrive late (15 or more minutes).  Arriving late affects you and other patients whose appointments are after yours.  Also, if you miss three or more appointments without notifying the office, you may be dismissed from the clinic at the provider's discretion.      For prescription refill requests, have your pharmacy contact our office and allow 72 hours for refills to be completed.    Today you received the following chemotherapy and/or immunotherapy agents: pembrolizumab      To help prevent nausea and vomiting after your treatment, we encourage you to take your nausea medication as directed.  BELOW ARE SYMPTOMS THAT SHOULD BE REPORTED IMMEDIATELY: *FEVER GREATER THAN 100.4 F (38 C) OR HIGHER *CHILLS OR SWEATING *NAUSEA AND VOMITING THAT IS NOT CONTROLLED WITH YOUR NAUSEA MEDICATION *UNUSUAL SHORTNESS OF BREATH *UNUSUAL BRUISING OR BLEEDING *URINARY PROBLEMS (pain or burning when urinating, or frequent urination) *BOWEL PROBLEMS (unusual diarrhea, constipation, pain near the anus) TENDERNESS IN MOUTH AND THROAT WITH OR WITHOUT PRESENCE OF ULCERS (sore throat, sores in mouth, or a toothache) UNUSUAL RASH, SWELLING OR PAIN  UNUSUAL VAGINAL DISCHARGE OR ITCHING   Items with * indicate a potential emergency and should be followed up as soon as possible or go to the Emergency Department if any problems should occur.  Please show the CHEMOTHERAPY ALERT CARD or  IMMUNOTHERAPY ALERT CARD at check-in to the Emergency Department and triage nurse.  Should you have questions after your visit or need to cancel or reschedule your appointment, please contact [facility] - A DEPT OF [facility]  Dept: [phone]  and follow the prompts.  Office hours are 8:00 a.m. to 4:30 p.m. Monday - Friday. Please note that voicemails left after 4:00 p.m. may not be returned until the following business day.  We are closed weekends and major holidays. You have access to a nurse at all times for urgent questions. Please call the main number to the clinic Dept: [phone] and follow the prompts.   For any non-urgent questions, you may also contact your provider using MyChart. We now offer e-Visits for anyone 78 and older to request care online for non-urgent symptoms. For details visit [email or web address].   Also download the MyChart app! Go to the app store, search "MyChart", open the app, select [place], and log in with your MyChart username and password.

## 2024-02-26 NOTE — Progress Notes (Signed)
 [facility] [phone]   [phone]  OFFICE PROGRESS NOTE  Brandi Crigler, MD [address] [place] [address]  DIAGNOSIS: Metastatic non-small cell lung cancer, adenocarcinoma that was initially diagnosed as stage IIb (T1b, N1, M0) non-small cell lung cancer, adenocarcinoma presented with right upper lobe lung nodule in June 2021.   Detected Alteration(s) / Biomarker(s) Associated FDA-approved therapies Clinical Trial Availability % cfDNA or Amplification BRAF V600E approved by FDA Dabrafenib+trametinib, Encorafenib+binimetinib approved in other indication Cobimetinib, Dabrafenib, Trametinib, Vemurafenib, Vemurafenib+cobimetinib Yes 8.3%  BRCA1 R252S None (VUS) None (VUS) 0.1%   SMAD4 D464fs None None 7.2%  PD-L1 expression 35%  PRIOR THERAPY:  1) Status post right upper lobectomy with lymph node dissection on September 30, 2019 in [place] .  She declines adjuvant systemic chemotherapy. 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 Dewey.  CURRENT THERAPY: Systemic chemotherapy with carboplatin  for AUC of 5, Alimta  500 Mg/M2 and Keytruda  200 Mg IV every 3 weeks.  First dose April 16, 2022.  Status post 33  cycles.  Starting cycle #5 she will be on maintenance treatment with Keytruda  every 3 weeks.  INTERVAL HISTORY: Brandi Holmes 73 y.o. female returns to the clinic today for follow-up visit. Discussed the use of AI scribe software for clinical note transcription with the patient, who gave verbal consent to proceed.  History of Present Illness Brandi Holmes is a 73 year old female with metastatic non-small cell lung cancer who presents for evaluation before starting cycle number thirty-four of chemotherapy.  She has a history of metastatic non-small cell lung cancer, adenocarcinoma, initially  diagnosed as stage 2B in June 2021, with evidence of metastasis in December 2012. She is currently undergoing chemotherapy with carboplatin , pemetrexed , and pembrolizumab  (Keytruda ) every three weeks. Her molecular study showed positive BRAF V600E and PD-L1 expression of 35%.  She feels generally well but notes an increase in coughing, which she attributes to her emphysema. The cough is described as rattling. No fever, recent cold, or flu symptoms. She lives alone and has an aide who attends to her. She has not seen a pulmonologist recently, as her previous pulmonologist, Dr. Brenna, released her from care. She is not currently using an inhaler and prefers to avoid steroid medications.  She is concerned about her kidney function, noting that her GFR dropped to 32 from 38, with a creatinine increase from 1.3 to 1.66. She is trying to maintain her GFR in the forties and is focusing on hydration to support her kidney health.    MEDICAL HISTORY: Past Medical History:  Diagnosis Date   Anemia    as a child only   Coronary artery calcification of native artery    Hyperlipidemia    Hypertension    Liver spots     per pt   Lung nodule    Wears glasses    Wears partial dentures     ALLERGIES:  is allergic to crab [shellfish allergy] and morphine  and codeine.  MEDICATIONS:  Current Outpatient Medications  Medication Sig Dispense Refill   aspirin  EC 81 MG tablet Take 81 mg by mouth at bedtime. Swallow whole.     atorvastatin  (LIPITOR) 20 MG tablet Take 20 mg by mouth at bedtime.     folic acid  (FOLVITE ) 1 MG tablet Take 1 mg by mouth daily.     furosemide (  LASIX) 20 MG tablet Take 20 mg by mouth daily.     lisinopril  (ZESTRIL ) 10 MG tablet Take 1 tablet (10 mg total) by mouth every morning. 90 tablet 0   pregabalin  (LYRICA ) 150 MG capsule Take 150 mg by mouth 2 (two) times daily.     prochlorperazine  (COMPAZINE ) 10 MG tablet Take 1 tablet (10 mg total) by mouth every 6 (six) hours as needed  for nausea or vomiting. (Patient not taking: Reported on 01/15/2024) 30 tablet 0   tiZANidine  (ZANAFLEX ) 2 MG tablet Take 2 mg by mouth at bedtime.     traMADol  HCl 100 MG TABS Take 1 tablet by mouth QID. 1 tablet by mouth every 6 hours as needed for pain     No current facility-administered medications for this visit.    SURGICAL HISTORY:  Past Surgical History:  Procedure Laterality Date   ABDOMINAL HYSTERECTOMY  1974   heavy bleeding   ABDOMINAL HYSTERECTOMY     BRONCHIAL BRUSHINGS  08/13/2019   Procedure: BRONCHIAL BRUSHINGS;  Surgeon: Brenna Adine CROME, DO;  [facility];  Service: Pulmonary;;   BRONCHIAL WASHINGS  08/13/2019   Procedure: BRONCHIAL WASHINGS;  Surgeon: Brenna Adine CROME, DO;  [facility];  Service: Pulmonary;;   COLONOSCOPY W/ BIOPSIES AND POLYPECTOMY     DILATION AND CURETTAGE OF UTERUS     FINE NEEDLE ASPIRATION  08/13/2019   Procedure: FINE NEEDLE ASPIRATION (FNA) LINEAR;  Surgeon: Brenna Adine CROME, DO;  [facility]: [facility];  Service: Pulmonary;;   FOOT SURGERY     bilateral bunions and hammer toes   IR RADIOLOGIST EVAL & MGMT  05/20/2022   LUNG BIOPSY  08/13/2019   Procedure: LUNG BIOPSY;  Surgeon: Brenna Adine CROME, DO;  [facility]: [facility];  Service: Pulmonary;;   MULTIPLE TOOTH EXTRACTIONS     VIDEO BRONCHOSCOPY WITH ENDOBRONCHIAL NAVIGATION Right 08/13/2019   Procedure: VIDEO BRONCHOSCOPY WITH ENDOBRONCHIAL NAVIGATION;  Surgeon: Brenna Adine CROME, DO;  [facility];  Service: Pulmonary;  Laterality: Right;   VIDEO BRONCHOSCOPY WITH ENDOBRONCHIAL ULTRASOUND Right 08/13/2019   Procedure: VIDEO BRONCHOSCOPY WITH ENDOBRONCHIAL ULTRASOUND;  Surgeon: Brenna Adine CROME, DO;  [facility]: [facility];  Service: Pulmonary;  Laterality: Right;    REVIEW OF SYSTEMS:  A comprehensive review of systems was negative except for: Constitutional: positive for fatigue Musculoskeletal: positive for back pain   PHYSICAL EXAMINATION: General  appearance: alert, cooperative, fatigued, and no distress Head: Normocephalic, without obvious abnormality, atraumatic Neck: no adenopathy, no JVD, supple, symmetrical, trachea midline, and thyroid  not enlarged, symmetric, no tenderness/mass/nodules Lymph nodes: Cervical, supraclavicular, and axillary nodes normal. Resp: clear to auscultation bilaterally Back: symmetric, no curvature. ROM normal. No CVA tenderness. Cardio: regular rate and rhythm, S1, S2 normal, no murmur, click, rub or gallop GI: soft, non-tender; bowel sounds normal; no masses,  no organomegaly Extremities: extremities normal, atraumatic, no cyanosis or edema  ECOG PERFORMANCE STATUS: 1 - Symptomatic but completely ambulatory  Pulse 78, temperature 98 F (36.7 C), temperature source Temporal, resp. rate 17, height 5' 3 (1.6 m), weight 174 lb 9.6 oz (79.2 kg), SpO2 99%.    LABORATORY DATA: Lab Results  Component Value Date   WBC 10.7 (H) 02/26/2024   HGB 12.4 02/26/2024   HCT 37.8 02/26/2024   MCV 89.8 02/26/2024   PLT 200 02/26/2024      Chemistry      Component Value Date/Time   NA 142 02/05/2024 1322   NA 142 07/22/2019 1522   K 4.6 02/05/2024 1322  CL 108 02/05/2024 1322   CO2 28 02/05/2024 1322   BUN 26 (H) 02/05/2024 1322   BUN 13 07/22/2019 1522   CREATININE 1.66 (H) 02/05/2024 1322   CREATININE 0.91 09/16/2011 1000      Component Value Date/Time   CALCIUM  9.4 02/05/2024 1322   ALKPHOS 94 02/05/2024 1322   AST 26 02/05/2024 1322   ALT 15 02/05/2024 1322   BILITOT 0.7 02/05/2024 1322       RADIOGRAPHIC STUDIES: No results found.     ASSESSMENT AND PLAN: This is a pleasant 73 years old African-American female diagnosed with metastatic also lung cancer, adenocarcinoma initially diagnosed as stage IIb (T1b, N1, M0) non-small cell lung cancer, adenocarcinoma status post a right upper lobectomy with lymph node dissection on September 30, 2019 in [place] .  She declined adjuvant systemic chemotherapy  at that time. She was found to have evidence for disease metastasis in November 2023 presenting with multiple metastatic bone lesions. The patient had molecular studies that showed an actionable mutation with BRAF V600E mutation and PD-L1 expression of 35%. She underwent palliative radiotherapy to the T9 vertebral lesion under the care of Dr. Dewey. The patient is currently undergoing first-line combination of systemic chemotherapy with carboplatin  for AUC of 5, Alimta  500 Mg/M2 and Keytruda  200 Mg IV every 3 weeks.  Status post 33 cycles.  Starting from cycle #5 she will be on maintenance treatment with Alimta  and Keytruda  every 3 weeks.  Starting from cycle #5 she will be on single agent Keytruda  only.  Alimta  will be discontinued secondary to renal insufficiency. She has been tolerating this treatment fairly well except for some mild fatigue and this morning she had some diarrhea and right-sided rib cage pain Assessment and Plan Assessment & Plan Metastatic non-small cell lung adenocarcinoma Diagnosed in June 2021, initially stage 2B with metastasis. Currently on carboplatin , Alimta , and Keytruda  every three weeks. Molecular studies show positive BRAF V600E and PD-L1 expression of 35%. Preparing for cycle 34 of treatment. Discussion about the duration of Keytruda  treatment, clarifying that it is typically two years, with the possibility of resuming if cancer recurs. - Continue carboplatin , Alimta , and Keytruda  every three weeks. - Will perform a scan after the last cycle to assess treatment response. - Will discuss potential resumption of Keytruda  if cancer recurs.  Emphysema Mild emphysema with increased cough and rattling sound. No recent cold or flu symptoms. Prefers not to use inhalers due to steroid content. - Consider using Mucinex to help with cough and rattling.  Chronic kidney disease, stage 3 Recent creatinine increase from 1.3 to 1.66. GFR previously dropped to 32. Emphasis on  maintaining hydration to support kidney function. - Encouraged hydration to support kidney function. She was advised to call immediately if she has any concerning send in the interval.  The patient voices understanding of current disease status and treatment options and is in agreement with the current care plan. The total time spent in the appointment was 20 minutes.  All questions were answered. The patient knows to call the clinic with any problems, questions or concerns. We can certainly see the patient much sooner if necessary.  Disclaimer: This note was dictated with voice recognition software. Similar sounding words can inadvertently be transcribed and may not be corrected upon review.

## 2024-03-05 ENCOUNTER — Other Ambulatory Visit: Payer: Self-pay

## 2024-03-12 NOTE — Progress Notes (Deleted)
 [facility] OFFICE PROGRESS NOTE  Brandi Crigler, MD [address] [place] [address]  DIAGNOSIS: Metastatic non-small cell lung cancer, adenocarcinoma that was initially diagnosed as stage IIb (T1b, N1, M0) non-small cell lung cancer, adenocarcinoma presented with right upper lobe lung nodule   Detected Alteration(s) / Biomarker(s) Associated FDA-approved therapiesClinical Trial Availability% cfDNA or Amplification BRAF V600E approved by FDA Dabrafenib+trametinib, Encorafenib+binimetinib approved in other indication Cobimetinib, Dabrafenib, Trametinib, Vemurafenib, Vemurafenib+cobimetinib Yes8.3%   BRCA1 R252S None (VUS) None (VUS) 0.1%     SMAD4 D411fs None None 7.2%   PD-L1 expression 35%  PRIOR THERAPY: 1) Status post right upper lobectomy with lymph node dissection on September 30, 2019 in [place] .  She declines adjuvant systemic chemotherapy. 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 Dewey.  CURRENT THERAPY: Systemic chemotherapy with carboplatin  for AUC of 5, Alimta  500 Mg/M2 and Keytruda  200 Mg IV every 3 weeks. First dose April 16, 2022. Status post 33 cycles. Starting cycle #5 she will be on maintenance treatment with Keytruda  every 3 weeks.   INTERVAL HISTORY: Brandi Holmes 73 y.o. female returns  to the clinic for a follow-up visit. The patient is feeling fairly today without any concerning complaints.    She is currently undergoing maintenance treatment with immunotherapy with Keytruda . Alimta  ws discontinued due to CKD.  She has no issues with her recent immunotherapy treatment.   No fevers, chills, or night sweats. No new nausea, vomiting, diarrhea, or constipation. She denies rashes or skin changes. She denies changes with her breathing. She previously experienced constipation but managed it with medication and dietary  changes and she is no longer having trouble with this. She is prescribed lyrica  and tramadol  for chronic back pain since her surgery several years ago. She is here for evaluation and repeat blood work before undergoing cycle #34  MEDICAL HISTORY: Past Medical History:  Diagnosis Date   Anemia    as a child only   Coronary artery calcification of native artery    Hyperlipidemia    Hypertension    Liver spots     per pt   Lung nodule    Wears glasses    Wears partial dentures     ALLERGIES:  is allergic to crab [shellfish allergy] and morphine  and codeine.  MEDICATIONS:  Current Outpatient Medications  Medication Sig Dispense Refill   aspirin  EC 81 MG tablet Take 81 mg by mouth at bedtime. Swallow whole.     atorvastatin  (LIPITOR) 20 MG tablet Take 20 mg by mouth at bedtime.     folic acid  (FOLVITE ) 1 MG tablet Take 1 mg by mouth daily.     furosemide (LASIX) 20 MG tablet Take 20 mg by mouth daily.     lisinopril  (ZESTRIL ) 10 MG tablet Take 1 tablet (10 mg total) by mouth every morning. 90 tablet 0   pregabalin  (LYRICA ) 150 MG capsule Take 150 mg by mouth 2 (two) times daily.     prochlorperazine  (COMPAZINE ) 10 MG tablet Take 1 tablet (10 mg total) by mouth every 6 (six) hours as needed for nausea or vomiting. (Patient not taking: Reported on 01/15/2024) 30 tablet 0   tiZANidine  (ZANAFLEX ) 2 MG tablet Take 2 mg by mouth at bedtime.     traMADol  HCl 100 MG TABS Take 1 tablet by mouth QID. 1 tablet by mouth every 6 hours as needed for  pain     No current facility-administered medications for this visit.    SURGICAL HISTORY:  Past Surgical History:  Procedure Laterality Date   ABDOMINAL HYSTERECTOMY  1974   heavy bleeding   ABDOMINAL HYSTERECTOMY     BRONCHIAL BRUSHINGS  08/13/2019   Procedure: BRONCHIAL BRUSHINGS;  Surgeon: Brenna Adine CROME, DO;  [facility];  Service: Pulmonary;;   BRONCHIAL WASHINGS  08/13/2019   Procedure: BRONCHIAL WASHINGS;  Surgeon: Brenna Adine CROME, DO;  [facility];  Service: Pulmonary;;   COLONOSCOPY W/ BIOPSIES AND POLYPECTOMY     DILATION AND CURETTAGE OF UTERUS     FINE NEEDLE ASPIRATION  08/13/2019   Procedure: FINE NEEDLE ASPIRATION (FNA) LINEAR;  Surgeon: Brenna Adine CROME, DO;  [facility];  Service: Pulmonary;;   FOOT SURGERY     bilateral bunions and hammer toes   IR RADIOLOGIST EVAL & MGMT  05/20/2022   LUNG BIOPSY  08/13/2019   Procedure: LUNG BIOPSY;  Surgeon: Brenna Adine CROME, DO;  [facility];  Service: Pulmonary;;   MULTIPLE TOOTH EXTRACTIONS     VIDEO BRONCHOSCOPY WITH ENDOBRONCHIAL NAVIGATION Right 08/13/2019   Procedure: VIDEO BRONCHOSCOPY WITH ENDOBRONCHIAL NAVIGATION;  Surgeon: Brenna Adine CROME, DO;  [facility];  Service: Pulmonary;  Laterality: Right;   VIDEO BRONCHOSCOPY WITH ENDOBRONCHIAL ULTRASOUND Right 08/13/2019   Procedure: VIDEO BRONCHOSCOPY WITH ENDOBRONCHIAL ULTRASOUND;  Surgeon: Brenna Adine CROME, DO;  [facility];  Service: Pulmonary;  Laterality: Right;    REVIEW OF SYSTEMS:   Review of Systems  Constitutional: Negative for appetite change, chills, fatigue, fever and unexpected weight change.  HENT:   Negative for mouth sores, nosebleeds, sore throat and trouble swallowing.   Eyes: Negative for eye problems and icterus.  Respiratory: Negative for cough, hemoptysis, shortness of breath and wheezing.   Cardiovascular: Negative for chest pain and leg swelling.  Gastrointestinal: Negative for abdominal pain, constipation, diarrhea, nausea and vomiting.  Genitourinary: Negative for bladder incontinence, difficulty urinating, dysuria, frequency and hematuria.   Musculoskeletal: Negative for back pain, gait problem, neck pain and neck stiffness.  Skin: Negative for itching and rash.  Neurological: Negative for dizziness, extremity weakness, gait problem, headaches, light-headedness and seizures.  Hematological: Negative for adenopathy. Does not  bruise/bleed easily.  Psychiatric/Behavioral: Negative for confusion, depression and sleep disturbance. The patient is not nervous/anxious.     PHYSICAL EXAMINATION:  There were no vitals taken for this visit.  ECOG PERFORMANCE STATUS: {CHL ONC ECOG [id number]  Physical Exam  Constitutional: Oriented to person, place, and time and well-developed, well-nourished, and in no distress. No distress.  HENT:  Head: Normocephalic and atraumatic.  Mouth/Throat: Oropharynx is clear and moist. No oropharyngeal exudate.  Eyes: Conjunctivae are normal. Right eye exhibits no discharge. Left eye exhibits no discharge. No scleral icterus.  Neck: Normal range of motion. Neck supple.  Cardiovascular: Normal rate, regular rhythm, normal heart sounds and intact distal pulses.   Pulmonary/Chest: Effort normal and breath sounds normal. No respiratory distress. No wheezes. No rales.  Abdominal: Soft. Bowel sounds are normal. Exhibits no distension and no mass. There is no tenderness.  Musculoskeletal: Normal range of motion. Exhibits no edema.  Lymphadenopathy:    No cervical adenopathy.  Neurological: Alert and oriented to person, place, and time. Exhibits normal muscle tone. Gait normal. Coordination normal.  Skin: Skin is warm and dry. No rash noted. Not diaphoretic. No erythema. No pallor.  Psychiatric: Mood, memory and judgment normal.  Vitals reviewed.  LABORATORY DATA:  Lab Results  Component Value Date   WBC 10.7 (H) 02/26/2024   HGB 12.4 02/26/2024   HCT 37.8 02/26/2024   MCV 89.8 02/26/2024   PLT 200 02/26/2024      Chemistry      Component Value Date/Time   NA 142 02/26/2024 1337   NA 142 07/22/2019 1522   K 4.6 02/26/2024 1337   CL 110 02/26/2024 1337   CO2 26 02/26/2024 1337   BUN 22 02/26/2024 1337   BUN 13 07/22/2019 1522   CREATININE 1.34 (H) 02/26/2024 1337   CREATININE 0.91 09/16/2011 1000      Component Value Date/Time   CALCIUM  9.1 02/26/2024 1337   ALKPHOS 79  02/26/2024 1337   AST 23 02/26/2024 1337   ALT 12 02/26/2024 1337   BILITOT 0.7 02/26/2024 1337       RADIOGRAPHIC STUDIES:  No results found.   ASSESSMENT/PLAN:  This is a very pleasant 73 year old African-American female diagnosed with metastatic non-small cell lung cancer, adenocarcinoma.  She was initially diagnosed as a stage IIb (T1b, N1, M0).  She is status post right upper lobectomy with lymph node dissection on September 30, 2019 in [place] .  She declined adjuvant systemic chemotherapy at that time.   She is found to have metastatic disease to the bone in November 2023.   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   She underwent palliative radiation to T9 under the care of Dr. Dewey.   She is currently on systemic therapy.  She was started on systemic chemotherapy with carboplatin  for an AUC of 5, Alimta  500 mg/m, and Keytruda  200 mg IV every 3 weeks.  She is status post 33 cycles.  Starting from cycle #5, she started maintenance treatment with immunotherapy with Keytruda  only.  Alimta  was discontinued secondary to renal insufficiency.    Labs were reviewed. Recommend that she *** with cycle #34 today as schedule. She is ok to treat with a creatinine of  ***   We will see her back for follow-up visit in 3 weeks for evaluation repeat blood work before undergoing cycle #35.   She will continue lyrica  and tramadol  for her chronic back pain. She also uses heating pad.   The patient was advised to call immediately if she has any concerning symptoms in the interval. The patient voices understanding of current disease status and treatment options and is in agreement with the current care plan. All questions were answered. The patient knows to call the clinic with any problems, questions or concerns. We can certainly see the patient much sooner if necessary   No orders of the defined types were placed in this encounter.    I spent  {CHL ONC TIME VISIT - DTPQU:[id number]} counseling the patient face to face. The total time spent in the appointment was {CHL ONC TIME VISIT - DTPQU:[id number]}.  Miaa Latterell L Sobia Karger, PA-C 03/12/24

## 2024-03-17 ENCOUNTER — Other Ambulatory Visit: Payer: Self-pay

## 2024-03-18 ENCOUNTER — Inpatient Hospital Stay

## 2024-03-18 ENCOUNTER — Inpatient Hospital Stay: Attending: Physician Assistant

## 2024-03-18 ENCOUNTER — Inpatient Hospital Stay: Admitting: Physician Assistant

## 2024-03-18 ENCOUNTER — Other Ambulatory Visit: Payer: Self-pay | Admitting: Physician Assistant

## 2024-03-18 ENCOUNTER — Telehealth: Payer: Self-pay | Admitting: Physician Assistant

## 2024-03-18 DIAGNOSIS — C7951 Secondary malignant neoplasm of bone: Secondary | ICD-10-CM

## 2024-03-18 NOTE — Telephone Encounter (Signed)
 The patient did not show up for her 7:30 appointment. She states her aid come at 10 AM and does not have transportation prior to that time. Additionally, due to her pain medications, she has difficulty waking up in the AM. I have sent a scheduling message and we will work on rescheduling.

## 2024-03-18 NOTE — Progress Notes (Signed)
 [facility] OFFICE PROGRESS NOTE  Brandi Crigler, MD [address] [place] [address]  DIAGNOSIS: Metastatic non-small cell lung cancer, adenocarcinoma that was initially diagnosed as stage IIb (T1b, N1, M0) non-small cell lung cancer, adenocarcinoma presented with right upper lobe lung nodule   Detected Alteration(s) / Biomarker(s) Associated FDA-approved therapiesClinical Trial Availability% cfDNA or Amplification BRAF V600E approved by FDA Dabrafenib+trametinib, Encorafenib+binimetinib approved in other indication Cobimetinib, Dabrafenib, Trametinib, Vemurafenib, Vemurafenib+cobimetinib Yes8.3%   BRCA1 R252S None (VUS) None (VUS) 0.1%     SMAD4 D472fs None None 7.2%   PD-L1 expression 35%  PRIOR THERAPY: 1) Status post right upper lobectomy with lymph node dissection on September 30, 2019 in [place] .  She declines adjuvant systemic chemotherapy. 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 Dewey.  CURRENT THERAPY: Systemic chemotherapy with carboplatin  for AUC of 5, Alimta  500 Mg/M2 and Keytruda  200 Mg IV every 3 weeks. First dose April 16, 2022. Status post 33 cycles. Starting cycle #5 she will be on maintenance treatment with Keytruda  every 3 weeks.   INTERVAL HISTORY: Brandi Holmes 73 y.o. female returns to the clinic for a follow-up visit. The patient is feeling fairly today.    She is currently undergoing maintenance treatment with immunotherapy with Keytruda . Alimta  ws discontinued due to CKD.  She has no issues with her recent immunotherapy treatment.  She tells me a few weeks ago she had a blister on her right shin. She saw her PCP who gave her antibiotics. She does try to elevate her legs. She does not like using compression stockings. There is no warmth or drainage. She was wondering if this was from her treatment. She does not  have any other skin rashes.   No fevers or night sweats. No new nausea, vomiting, or diarrhea. She does experience chronic constipation. She stopped taking the prescription medication that her PCP had provided for her. She follows up next week. She denies changes with her breathing except she thinks she may be coughing more. However, she states her cough is not typical for when you are sick. She does not think she needs a CXR or any cough medication. She is prescribed lyrica  and tramadol  for chronic back pain since her surgery several years ago. She is here for evaluation and repeat blood work before undergoing cycle #34.   MEDICAL HISTORY: Past Medical History:  Diagnosis Date   Anemia    as a child only   Coronary artery calcification of native artery    Hyperlipidemia    Hypertension    Liver spots     per pt   Lung nodule    Wears glasses    Wears partial dentures     ALLERGIES:  is allergic to crab [shellfish allergy] and morphine  and codeine.  MEDICATIONS:  Current Outpatient Medications  Medication Sig Dispense Refill   aspirin  EC 81 MG tablet Take 81 mg by mouth at bedtime. Swallow whole.     atorvastatin  (LIPITOR) 20 MG tablet Take 20 mg by mouth at bedtime.     folic acid  (FOLVITE ) 1 MG tablet Take 1 mg by mouth daily.     furosemide (LASIX) 20 MG tablet Take 20 mg by mouth daily.     lisinopril  (ZESTRIL ) 10 MG tablet Take 1 tablet (10 mg total) by mouth every morning. 90 tablet 0   pregabalin  (LYRICA ) 150 MG capsule  Take 150 mg by mouth 2 (two) times daily.     prochlorperazine  (COMPAZINE ) 10 MG tablet Take 1 tablet (10 mg total) by mouth every 6 (six) hours as needed for nausea or vomiting. 30 tablet 0   tiZANidine  (ZANAFLEX ) 2 MG tablet Take 2 mg by mouth at bedtime.     traMADol  HCl 100 MG TABS Take 1 tablet by mouth QID. 1 tablet by mouth every 6 hours as needed for pain     No current facility-administered medications for this visit.    SURGICAL HISTORY:  Past  Surgical History:  Procedure Laterality Date   ABDOMINAL HYSTERECTOMY  1974   heavy bleeding   ABDOMINAL HYSTERECTOMY     BRONCHIAL BRUSHINGS  08/13/2019   Procedure: BRONCHIAL BRUSHINGS;  Surgeon: Brenna Adine CROME, DO;  Location: [facility];  Service: Pulmonary;;   BRONCHIAL WASHINGS  08/13/2019   Procedure: BRONCHIAL WASHINGS;  Surgeon: Brenna Adine CROME, DO;  Location: [facility];  Service: Pulmonary;;   COLONOSCOPY W/ BIOPSIES AND POLYPECTOMY     DILATION AND CURETTAGE OF UTERUS     FINE NEEDLE ASPIRATION  08/13/2019   Procedure: FINE NEEDLE ASPIRATION (FNA) LINEAR;  Surgeon: Brenna Adine CROME, DO;  Location: [facility];  Service: Pulmonary;;   FOOT SURGERY     bilateral bunions and hammer toes   IR RADIOLOGIST EVAL & MGMT  05/20/2022   LUNG BIOPSY  08/13/2019   Procedure: LUNG BIOPSY;  Surgeon: Brenna Adine CROME, DO;  Location: [facility];  Service: Pulmonary;;   MULTIPLE TOOTH EXTRACTIONS     VIDEO BRONCHOSCOPY WITH ENDOBRONCHIAL NAVIGATION Right 08/13/2019   Procedure: VIDEO BRONCHOSCOPY WITH ENDOBRONCHIAL NAVIGATION;  Surgeon: Brenna Adine CROME, DO;  Location: [facility];  Service: Pulmonary;  Laterality: Right;   VIDEO BRONCHOSCOPY WITH ENDOBRONCHIAL ULTRASOUND Right 08/13/2019   Procedure: VIDEO BRONCHOSCOPY WITH ENDOBRONCHIAL ULTRASOUND;  Surgeon: Brenna Adine CROME, DO;  Location: [facility];  Service: Pulmonary;  Laterality: Right;    REVIEW OF SYSTEMS:   Review of Systems  Constitutional: Stable fatigue. Negative for appetite change, chills, fever and unexpected weight change.  HENT: Negative for mouth sores, nosebleeds, sore throat and trouble swallowing.   Eyes: Negative for eye problems and icterus.  Respiratory: Positive for stable dyspnea on exertion and occasional cough. Negative for hemoptysis and wheezing.  Cardiovascular: Negative for chest pain and leg swelling.  Gastrointestinal: Positive for chronic constipation. Negative for abdominal pain,  diarrhea, nausea  and vomiting.  Genitourinary: Negative for bladder incontinence, difficulty urinating, dysuria, frequency and hematuria.   Musculoskeletal: Positive for chronic back and right rib pain. Negative for back pain, gait problem, neck pain and neck stiffness.  Skin: Negative for itching and rash. Improved/healing blister on right shin.  Neurological: Positive for gait problem secondary to back pain. Negative for dizziness, extremity weakness, gait problem, headaches, light-headedness and seizures.  Hematological: Negative for adenopathy. Does not bruise/bleed easily.  Psychiatric/Behavioral: Negative for confusion, depression and sleep disturbance. The patient is not nervous/anxious.     PHYSICAL EXAMINATION:  Blood pressure 120/77, pulse 73, temperature 98.1 F (36.7 C), temperature source Temporal, resp. rate 18, weight 177 lb 11.2 oz (80.6 kg), SpO2 99%.  ECOG PERFORMANCE STATUS: 1  Physical Exam  Constitutional: Oriented to person, place, and time and well-developed, well-nourished, and in no distress.   HENT:  Head: Normocephalic and atraumatic.  Mouth/Throat: Oropharynx is clear and moist. No oropharyngeal exudate.  Eyes: Conjunctivae are normal. Right eye exhibits no discharge. Left eye exhibits no discharge. No scleral  icterus.  Neck: Normal range of motion. Neck supple.  Cardiovascular: Normal rate, regular rhythm, normal heart sounds and intact distal pulses.   Pulmonary/Chest: Effort normal and breath sounds normal. No respiratory distress. No wheezes. No rales.  Abdominal: Soft. Bowel sounds are normal. Exhibits no distension and no mass. There is no tenderness.  Musculoskeletal: Tenderness to palpation of upper back. Normal range of motion. Exhibits muscle wasting.  Lymphadenopathy:    No cervical adenopathy.  Neurological: Alert and oriented to person, place, and time. Exhibits muscle wasting. Skin: Skin is warm and dry. No rash noted. Not diaphoretic. No erythema. No pallor.   Psychiatric: Mood, memory and judgment normal.  Vitals reviewed.  LABORATORY DATA: Lab Results  Component Value Date   WBC 11.2 (H) 03/26/2024   HGB 12.4 03/26/2024   HCT 38.3 03/26/2024   MCV 90.1 03/26/2024   PLT 199 03/26/2024      Chemistry      Component Value Date/Time   NA 141 03/26/2024 1417   NA 142 07/22/2019 1522   K 4.6 03/26/2024 1417   CL 107 03/26/2024 1417   CO2 25 03/26/2024 1417   BUN 21 03/26/2024 1417   BUN 13 07/22/2019 1522   CREATININE 1.30 (H) 03/26/2024 1417   CREATININE 0.91 09/16/2011 1000      Component Value Date/Time   CALCIUM  9.4 03/26/2024 1417   ALKPHOS 98 03/26/2024 1417   AST 35 03/26/2024 1417   ALT 21 03/26/2024 1417   BILITOT 0.7 03/26/2024 1417       RADIOGRAPHIC STUDIES:  No results found.   ASSESSMENT/PLAN:  This is a very pleasant 73 year old African-American female diagnosed with metastatic non-small cell lung cancer, adenocarcinoma.  She was initially diagnosed as a stage IIb (T1b, N1, M0).  She is status post right upper lobectomy with lymph node dissection on September 30, 2019 in [place] .  She declined adjuvant systemic chemotherapy at that time.   She is found to have metastatic disease to the bone in November 2023.   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   She underwent palliative radiation to T9 under the care of Dr. Dewey.   She is currently on systemic therapy.  She was started on systemic chemotherapy with carboplatin  for an AUC of 5, Alimta  500 mg/m, and Keytruda  200 mg IV every 3 weeks.  She is status post 33 cycles.  Starting from cycle #5, she started maintenance treatment with immunotherapy with Keytruda  only.  Alimta  was discontinued secondary to renal insufficiency.    Labs were reviewed. Recommend that she proceed with cycle #34 today as schedule. She is ok to treat with a creatinine of  1.30   We will see her back for follow-up visit in 3 weeks for  evaluation repeat blood work before undergoing cycle #35. We will order a restaging CT at her next appointment.    She will continue lyrica  and tramadol  for her chronic back pain. She also uses heating pad.   I offered CXR for her mild cough. She declined. I let her know if she ever has new or worsening symptoms to contact us .   She sees her PCP next week for follow up.   The blister on her lower extremity is improving but she was advised to reach out to us  if she ever has concerning rash or take a picture. I let her know immunotherapy can cause rash.   The patient was advised  to call immediately if she has any concerning symptoms in the interval. The patient voices understanding of current disease status and treatment options and is in agreement with the current care plan. All questions were answered. The patient knows to call the clinic with any problems, questions or concerns. We can certainly see the patient much sooner if necessary   No orders of the defined types were placed in this encounter.   The total time spent in the appointment was 20-29 minutes  Desa Rech L Eldonna Neuenfeldt, PA-C 03/26/2024

## 2024-03-19 ENCOUNTER — Other Ambulatory Visit: Payer: Self-pay

## 2024-03-26 ENCOUNTER — Inpatient Hospital Stay: Attending: Physician Assistant

## 2024-03-26 ENCOUNTER — Inpatient Hospital Stay: Admitting: Physician Assistant

## 2024-03-26 ENCOUNTER — Inpatient Hospital Stay

## 2024-03-26 VITALS — BP 135/76 | HR 68 | Temp 98.3°F | Resp 16

## 2024-03-26 VITALS — BP 120/77 | HR 73 | Temp 98.1°F | Resp 18 | Wt 177.7 lb

## 2024-03-26 DIAGNOSIS — C7951 Secondary malignant neoplasm of bone: Secondary | ICD-10-CM | POA: Diagnosis not present

## 2024-03-26 DIAGNOSIS — R059 Cough, unspecified: Secondary | ICD-10-CM | POA: Diagnosis not present

## 2024-03-26 DIAGNOSIS — C3411 Malignant neoplasm of upper lobe, right bronchus or lung: Secondary | ICD-10-CM | POA: Diagnosis not present

## 2024-03-26 DIAGNOSIS — G8929 Other chronic pain: Secondary | ICD-10-CM | POA: Insufficient documentation

## 2024-03-26 DIAGNOSIS — Z902 Acquired absence of lung [part of]: Secondary | ICD-10-CM | POA: Diagnosis not present

## 2024-03-26 DIAGNOSIS — N189 Chronic kidney disease, unspecified: Secondary | ICD-10-CM | POA: Diagnosis not present

## 2024-03-26 DIAGNOSIS — Z923 Personal history of irradiation: Secondary | ICD-10-CM | POA: Diagnosis not present

## 2024-03-26 DIAGNOSIS — M549 Dorsalgia, unspecified: Secondary | ICD-10-CM | POA: Diagnosis not present

## 2024-03-26 DIAGNOSIS — Z79620 Long term (current) use of immunosuppressive biologic: Secondary | ICD-10-CM | POA: Diagnosis not present

## 2024-03-26 DIAGNOSIS — Z79899 Other long term (current) drug therapy: Secondary | ICD-10-CM | POA: Diagnosis not present

## 2024-03-26 DIAGNOSIS — Z5112 Encounter for antineoplastic immunotherapy: Secondary | ICD-10-CM | POA: Insufficient documentation

## 2024-03-26 LAB — CMP (CANCER CENTER ONLY)
ALT: 21 U/L (ref 0–44)
AST: 35 U/L (ref 15–41)
Albumin: 3.9 g/dL (ref 3.5–5.0)
Alkaline Phosphatase: 98 U/L (ref 38–126)
Anion gap: 10 (ref 5–15)
BUN: 21 mg/dL (ref 8–23)
CO2: 25 mmol/L (ref 22–32)
Calcium: 9.4 mg/dL (ref 8.9–10.3)
Chloride: 107 mmol/L (ref 98–111)
Creatinine: 1.3 mg/dL — ABNORMAL HIGH (ref 0.44–1.00)
GFR, Estimated: 43 mL/min — ABNORMAL LOW (ref >60)
Glucose, Bld: 111 mg/dL — ABNORMAL HIGH (ref 70–99)
Potassium: 4.6 mmol/L (ref 3.5–5.1)
Sodium: 141 mmol/L (ref 135–145)
Total Bilirubin: 0.7 mg/dL (ref 0.0–1.2)
Total Protein: 7 g/dL (ref 6.5–8.1)

## 2024-03-26 LAB — CBC WITH DIFFERENTIAL (CANCER CENTER ONLY)
Abs Immature Granulocytes: 0.02 K/uL (ref 0.00–0.07)
Basophils Absolute: 0.1 K/uL (ref 0.0–0.1)
Basophils Relative: 1 %
Eosinophils Absolute: 2.2 K/uL — ABNORMAL HIGH (ref 0.0–0.5)
Eosinophils Relative: 20 %
HCT: 38.3 % (ref 36.0–46.0)
Hemoglobin: 12.4 g/dL (ref 12.0–15.0)
Immature Granulocytes: 0 %
Lymphocytes Relative: 15 %
Lymphs Abs: 1.7 K/uL (ref 0.7–4.0)
MCH: 29.2 pg (ref 26.0–34.0)
MCHC: 32.4 g/dL (ref 30.0–36.0)
MCV: 90.1 fL (ref 80.0–100.0)
Monocytes Absolute: 0.7 K/uL (ref 0.1–1.0)
Monocytes Relative: 6 %
Neutro Abs: 6.6 K/uL (ref 1.7–7.7)
Neutrophils Relative %: 58 %
Platelet Count: 199 K/uL (ref 150–400)
RBC: 4.25 MIL/uL (ref 3.87–5.11)
RDW: 13.2 % (ref 11.5–15.5)
WBC Count: 11.2 K/uL — ABNORMAL HIGH (ref 4.0–10.5)
nRBC: 0 % (ref 0.0–0.2)

## 2024-03-26 LAB — TSH: TSH: 0.488 u[IU]/mL (ref 0.350–4.500)

## 2024-03-26 MED ORDER — SODIUM CHLORIDE 0.9 % IV SOLN
200.0000 mg | Freq: Once | INTRAVENOUS | Status: AC
  Administered 2024-03-26: 200 mg via INTRAVENOUS
  Filled 2024-03-26: qty 200

## 2024-03-26 MED ORDER — SODIUM CHLORIDE 0.9 % IV SOLN: Freq: Once | INTRAVENOUS | Status: AC

## 2024-03-30 ENCOUNTER — Other Ambulatory Visit: Payer: Self-pay

## 2024-04-14 ENCOUNTER — Other Ambulatory Visit: Payer: Self-pay | Admitting: Internal Medicine

## 2024-04-14 DIAGNOSIS — C7951 Secondary malignant neoplasm of bone: Secondary | ICD-10-CM

## 2024-04-18 ENCOUNTER — Other Ambulatory Visit: Payer: Self-pay

## 2024-04-19 ENCOUNTER — Inpatient Hospital Stay

## 2024-04-19 ENCOUNTER — Inpatient Hospital Stay: Attending: Physician Assistant

## 2024-04-19 ENCOUNTER — Encounter: Payer: Self-pay | Admitting: Internal Medicine

## 2024-04-19 ENCOUNTER — Inpatient Hospital Stay (HOSPITAL_BASED_OUTPATIENT_CLINIC_OR_DEPARTMENT_OTHER): Admitting: Internal Medicine

## 2024-04-19 VITALS — BP 134/64 | HR 80 | Temp 97.7°F | Resp 17 | Ht 63.0 in | Wt 178.0 lb

## 2024-04-19 VITALS — BP 103/72 | HR 67 | Temp 98.4°F | Resp 16

## 2024-04-19 DIAGNOSIS — G893 Neoplasm related pain (acute) (chronic): Secondary | ICD-10-CM | POA: Diagnosis not present

## 2024-04-19 DIAGNOSIS — C3411 Malignant neoplasm of upper lobe, right bronchus or lung: Secondary | ICD-10-CM | POA: Insufficient documentation

## 2024-04-19 DIAGNOSIS — N289 Disorder of kidney and ureter, unspecified: Secondary | ICD-10-CM | POA: Diagnosis not present

## 2024-04-19 DIAGNOSIS — Z923 Personal history of irradiation: Secondary | ICD-10-CM | POA: Insufficient documentation

## 2024-04-19 DIAGNOSIS — C7951 Secondary malignant neoplasm of bone: Secondary | ICD-10-CM

## 2024-04-19 DIAGNOSIS — C3490 Malignant neoplasm of unspecified part of unspecified bronchus or lung: Secondary | ICD-10-CM

## 2024-04-19 DIAGNOSIS — Z902 Acquired absence of lung [part of]: Secondary | ICD-10-CM | POA: Diagnosis not present

## 2024-04-19 DIAGNOSIS — Z5112 Encounter for antineoplastic immunotherapy: Secondary | ICD-10-CM | POA: Insufficient documentation

## 2024-04-19 LAB — CBC WITH DIFFERENTIAL (CANCER CENTER ONLY)
Abs Immature Granulocytes: 0.04 K/uL (ref 0.00–0.07)
Basophils Absolute: 0.1 K/uL (ref 0.0–0.1)
Basophils Relative: 1 %
Eosinophils Absolute: 1.9 K/uL — ABNORMAL HIGH (ref 0.0–0.5)
Eosinophils Relative: 17 %
HCT: 38.1 % (ref 36.0–46.0)
Hemoglobin: 12.3 g/dL (ref 12.0–15.0)
Immature Granulocytes: 0 %
Lymphocytes Relative: 25 %
Lymphs Abs: 2.7 K/uL (ref 0.7–4.0)
MCH: 28.9 pg (ref 26.0–34.0)
MCHC: 32.3 g/dL (ref 30.0–36.0)
MCV: 89.4 fL (ref 80.0–100.0)
Monocytes Absolute: 0.9 K/uL (ref 0.1–1.0)
Monocytes Relative: 8 %
Neutro Abs: 5.4 K/uL (ref 1.7–7.7)
Neutrophils Relative %: 49 %
Platelet Count: 192 K/uL (ref 150–400)
RBC: 4.26 MIL/uL (ref 3.87–5.11)
RDW: 13.2 % (ref 11.5–15.5)
WBC Count: 11 K/uL — ABNORMAL HIGH (ref 4.0–10.5)
nRBC: 0 % (ref 0.0–0.2)

## 2024-04-19 LAB — CMP (CANCER CENTER ONLY)
ALT: 16 U/L (ref 0–44)
AST: 31 U/L (ref 15–41)
Albumin: 4 g/dL (ref 3.5–5.0)
Alkaline Phosphatase: 92 U/L (ref 38–126)
Anion gap: 11 (ref 5–15)
BUN: 21 mg/dL (ref 8–23)
CO2: 23 mmol/L (ref 22–32)
Calcium: 9.2 mg/dL (ref 8.9–10.3)
Chloride: 106 mmol/L (ref 98–111)
Creatinine: 1.33 mg/dL — ABNORMAL HIGH (ref 0.44–1.00)
GFR, Estimated: 42 mL/min — ABNORMAL LOW
Glucose, Bld: 95 mg/dL (ref 70–99)
Potassium: 4.5 mmol/L (ref 3.5–5.1)
Sodium: 139 mmol/L (ref 135–145)
Total Bilirubin: 0.6 mg/dL (ref 0.0–1.2)
Total Protein: 7.1 g/dL (ref 6.5–8.1)

## 2024-04-19 MED ORDER — SODIUM CHLORIDE 0.9 % IV SOLN: Freq: Once | INTRAVENOUS | Status: AC

## 2024-04-19 MED ORDER — SODIUM CHLORIDE 0.9% FLUSH: 10.0000 mL | INTRAVENOUS | Status: DC | PRN

## 2024-04-19 MED ORDER — SODIUM CHLORIDE 0.9 % IV SOLN
200.0000 mg | Freq: Once | INTRAVENOUS | Status: AC
  Administered 2024-04-19: 200 mg via INTRAVENOUS
  Filled 2024-04-19: qty 200

## 2024-04-19 NOTE — Progress Notes (Signed)
 "     [facility] Telephone:([phone]) [phone]   Fax:([phone]  OFFICE PROGRESS NOTE  [name], MD [address] [place] [address]  DIAGNOSIS: Metastatic non-small cell lung cancer, adenocarcinoma that was initially diagnosed as stage IIb (T1b, N1, M0) non-small cell lung cancer, adenocarcinoma presented with right upper lobe lung nodule in June 2021.   Detected Alteration(s) / Biomarker(s) Associated FDA-approved therapies Clinical Trial Availability % cfDNA or Amplification BRAF V600E approved by FDA Dabrafenib+trametinib, Encorafenib+binimetinib approved in other indication Cobimetinib, Dabrafenib, Trametinib, Vemurafenib, Vemurafenib+cobimetinib Yes 8.3%  BRCA1 R252S None (VUS) None (VUS) 0.1%   SMAD4 D413fs None None 7.2%  PD-L1 expression 35%  PRIOR THERAPY:  1) Status post right upper lobectomy with lymph node dissection on September 30, 2019 in [place] .  She declines adjuvant systemic chemotherapy. 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 Dewey.  CURRENT THERAPY: Systemic chemotherapy with carboplatin  for AUC of 5, Alimta  500 Mg/M2 and Keytruda  200 Mg IV every 3 weeks.  First dose April 16, 2022.  Status post 34 cycles.  Starting cycle #5 she will be on maintenance treatment with Keytruda  every 3 weeks.  INTERVAL HISTORY: Katrinka Herbison McAdoo-Bey 74 y.o. female returns to the clinic today for follow-up visit. Discussed the use of AI scribe software for clinical note transcription with the patient, who gave verbal consent to proceed.  History of Present Illness Shirelle Renuka Farfan Levan is a 74 year old female with metastatic non-small cell lung cancer with bone metastases who presents for evaluation prior to cycle 35 of maintenance pembrolizumab  therapy.  She completed four cycles of carboplatin  and pembrolizumab , followed by 30  cycles of single-agent pembrolizumab  maintenance, and is currently asymptomatic from a systemic perspective. She denies new symptoms and states, I feel fine and Everything's good. She expresses concern regarding possible disease progression or further metastasis.  She continues to experience chronic pain related to bone metastases, particularly with ambulation and attempts to stand upright, describing pain throughout the affected areas when walking upright and an inability to walk quickly. She also reports mid-back pain when lying down, which is alleviated by using a large pillow for support. Pain is managed with scheduled medication three times daily, which she finds effective for both pain control and sleep, without requiring additional doses.  She reports stable respiratory status, describing her breathing as pretty good, though she is unable to walk quickly. She has a follow-up appointment with her pulmonologist at the end of the month. She receives daily assistance from an aide in the mornings for activities of daily living.    MEDICAL HISTORY: Past Medical History:  Diagnosis Date   Anemia    as a child only   Coronary artery calcification of native artery    Hyperlipidemia    Hypertension    Liver spots     per pt   Lung nodule    Wears glasses    Wears partial dentures     ALLERGIES:  is allergic to crab [shellfish allergy] and morphine  and codeine.  MEDICATIONS:  Current Outpatient Medications  Medication Sig Dispense Refill   aspirin  EC 81 MG tablet Take 81 mg by mouth at bedtime. Swallow whole.     atorvastatin  (LIPITOR) 20 MG tablet Take 20 mg by mouth at bedtime.     folic acid  (FOLVITE ) 1 MG tablet Take 1 mg by mouth daily.  furosemide (LASIX) 20 MG tablet Take 20 mg by mouth daily.     lisinopril  (ZESTRIL ) 10 MG tablet Take 1 tablet (10 mg total) by mouth every morning. 90 tablet 0   pregabalin  (LYRICA ) 150 MG capsule Take 150 mg by mouth 2 (two) times  daily.     prochlorperazine  (COMPAZINE ) 10 MG tablet Take 1 tablet (10 mg total) by mouth every 6 (six) hours as needed for nausea or vomiting. 30 tablet 0   tiZANidine  (ZANAFLEX ) 2 MG tablet Take 2 mg by mouth at bedtime.     traMADol  HCl 100 MG TABS Take 1 tablet by mouth QID. 1 tablet by mouth every 6 hours as needed for pain     No current facility-administered medications for this visit.    SURGICAL HISTORY:  Past Surgical History:  Procedure Laterality Date   ABDOMINAL HYSTERECTOMY  1974   heavy bleeding   ABDOMINAL HYSTERECTOMY     BRONCHIAL BRUSHINGS  08/13/2019   Procedure: BRONCHIAL BRUSHINGS;  Surgeon: Brenna Adine CROME, DO;  [facility]: [facility];  Service: Pulmonary;;   BRONCHIAL WASHINGS  08/13/2019   Procedure: BRONCHIAL WASHINGS;  Surgeon: Brenna Adine CROME, DO;  [facility]: [facility];  Service: Pulmonary;;   COLONOSCOPY W/ BIOPSIES AND POLYPECTOMY     DILATION AND CURETTAGE OF UTERUS     FINE NEEDLE ASPIRATION  08/13/2019   Procedure: FINE NEEDLE ASPIRATION (FNA) LINEAR;  Surgeon: Brenna Adine CROME, DO;  Location: [facility];  Service: Pulmonary;;   FOOT SURGERY     bilateral bunions and hammer toes   IR RADIOLOGIST EVAL & MGMT  05/20/2022   LUNG BIOPSY  08/13/2019   Procedure: LUNG BIOPSY;  Surgeon: Brenna Adine CROME, DO;  Location: [facility];  Service: Pulmonary;;   MULTIPLE TOOTH EXTRACTIONS     VIDEO BRONCHOSCOPY WITH ENDOBRONCHIAL NAVIGATION Right 08/13/2019   Procedure: VIDEO BRONCHOSCOPY WITH ENDOBRONCHIAL NAVIGATION;  Surgeon: Brenna Adine CROME, DO;  Location: [facility];  Service: Pulmonary;  Laterality: Right;   VIDEO BRONCHOSCOPY WITH ENDOBRONCHIAL ULTRASOUND Right 08/13/2019   Procedure: VIDEO BRONCHOSCOPY WITH ENDOBRONCHIAL ULTRASOUND;  Surgeon: Brenna Adine CROME, DO;  Location: [facility];  Service: Pulmonary;  Laterality: Right;    REVIEW OF SYSTEMS:  A comprehensive review of systems was negative except for: Constitutional: positive for  fatigue Musculoskeletal: positive for back pain   PHYSICAL EXAMINATION: General appearance: alert, cooperative, fatigued, and no distress Head: Normocephalic, without obvious abnormality, atraumatic Neck: no adenopathy, no JVD, supple, symmetrical, trachea midline, and thyroid  not enlarged, symmetric, no tenderness/mass/nodules Lymph nodes: Cervical, supraclavicular, and axillary nodes normal. Resp: clear to auscultation bilaterally Back: symmetric, no curvature. ROM normal. No CVA tenderness. Cardio: regular rate and rhythm, S1, S2 normal, no murmur, click, rub or gallop GI: soft, non-tender; bowel sounds normal; no masses,  no organomegaly Extremities: extremities normal, atraumatic, no cyanosis or edema  ECOG PERFORMANCE STATUS: 1 - Symptomatic but completely ambulatory  Blood pressure 134/64, pulse 80, temperature 97.7 F (36.5 C), temperature source Temporal, resp. rate 17, height 5' 3 (1.6 m), weight 178 lb (80.7 kg), SpO2 95%.    LABORATORY DATA: Lab Results  Component Value Date   WBC 11.0 (H) 04/19/2024   HGB 12.3 04/19/2024   HCT 38.1 04/19/2024   MCV 89.4 04/19/2024   PLT 192 04/19/2024      Chemistry      Component Value Date/Time   NA 141 03/26/2024 1417   NA 142 07/22/2019 1522   K 4.6 03/26/2024 1417   CL 107  03/26/2024 1417   CO2 25 03/26/2024 1417   BUN 21 03/26/2024 1417   BUN 13 07/22/2019 1522   CREATININE 1.30 (H) 03/26/2024 1417   CREATININE 0.91 09/16/2011 1000      Component Value Date/Time   CALCIUM  9.4 03/26/2024 1417   ALKPHOS 98 03/26/2024 1417   AST 35 03/26/2024 1417   ALT 21 03/26/2024 1417   BILITOT 0.7 03/26/2024 1417       RADIOGRAPHIC STUDIES: No results found.     ASSESSMENT AND PLAN: This is a pleasant 74 years old African-American female diagnosed with metastatic also lung cancer, adenocarcinoma initially diagnosed as stage IIb (T1b, N1, M0) non-small cell lung cancer, adenocarcinoma status post a right upper lobectomy  with lymph node dissection on September 30, 2019 in [place] .  She declined adjuvant systemic chemotherapy at that time. She was found to have evidence for disease metastasis in November 2023 presenting with multiple metastatic bone lesions. The patient had molecular studies that showed an actionable mutation with BRAF V600E mutation and PD-L1 expression of 35%. She underwent palliative radiotherapy to the T9 vertebral lesion under the care of Dr. Dewey. The patient is currently undergoing first-line combination of systemic chemotherapy with carboplatin  for AUC of 5, Alimta  500 Mg/M2 and Keytruda  200 Mg IV every 3 weeks.  Status post 34 cycles.  Starting from cycle #5 she will be on maintenance treatment with Alimta  and Keytruda  every 3 weeks.  Starting from cycle #5 she will be on single agent Keytruda  only.  Alimta  will be discontinued secondary to renal insufficiency. She has been tolerating this treatment fairly well except for some mild fatigue and this morning she had some diarrhea and right-sided rib cage pain Assessment and Plan Assessment & Plan Metastatic non-small cell lung cancer with bone metastases She has advanced non-small cell lung cancer with bone metastases, previously treated with carboplatin  and pembrolizumab , currently receiving maintenance pembrolizumab  with 34 cycles completed. Surveillance imaging is planned due to concern for disease progression. - Planned repeat imaging in two weeks to assess disease status. - Discussed conditional plan: if imaging demonstrates stable disease, will hold treatment and observe; if progression is identified, will consider alternative therapies.  Chronic neoplasm-related pain She experiences chronic pain secondary to metastatic disease, primarily involving the back and anterior chest. Pain is well controlled with her current scheduled regimen, permitting adequate sleep and daily function. She is interested in strength training to improve posture and  mobility, and uses supportive pillows for comfort. No escalation of analgesic therapy is required. - Continued current pain medication regimen three times daily. - Encouraged strength training to improve posture and mobility. - Continued use of supportive pillows for comfort. She was advised to call immediately if she has any other concerning symptoms in the interval. The patient voices understanding of current disease status and treatment options and is in agreement with the current care plan. The total time spent in the appointment was 20 minutes.  All questions were answered. The patient knows to call the clinic with any problems, questions or concerns. We can certainly see the patient much sooner if necessary.  Disclaimer: This note was dictated with voice recognition software. Similar sounding words can inadvertently be transcribed and may not be corrected upon review.       "

## 2024-04-19 NOTE — Patient Instructions (Signed)
 [facility] - A DEPT OF [facility]  Discharge Instructions: Thank you for choosing [facility] to provide your oncology and hematology care.   If you have a lab appointment with the Cancer Center, please go directly to the Cancer Center and check in at the registration area.   Wear comfortable clothing and clothing appropriate for easy access to any Portacath or PICC line.   We strive to give you quality time with your provider. You may need to reschedule your appointment if you arrive late (15 or more minutes).  Arriving late affects you and other patients whose appointments are after yours.  Also, if you miss three or more appointments without notifying the office, you may be dismissed from the clinic at the provider's discretion.      For prescription refill requests, have your pharmacy contact our office and allow 72 hours for refills to be completed.    Today you received the following chemotherapy and/or immunotherapy agents: pembrolizumab      To help prevent nausea and vomiting after your treatment, we encourage you to take your nausea medication as directed.  BELOW ARE SYMPTOMS THAT SHOULD BE REPORTED IMMEDIATELY: *FEVER GREATER THAN 100.4 F (38 C) OR HIGHER *CHILLS OR SWEATING *NAUSEA AND VOMITING THAT IS NOT CONTROLLED WITH YOUR NAUSEA MEDICATION *UNUSUAL SHORTNESS OF BREATH *UNUSUAL BRUISING OR BLEEDING *URINARY PROBLEMS (pain or burning when urinating, or frequent urination) *BOWEL PROBLEMS (unusual diarrhea, constipation, pain near the anus) TENDERNESS IN MOUTH AND THROAT WITH OR WITHOUT PRESENCE OF ULCERS (sore throat, sores in mouth, or a toothache) UNUSUAL RASH, SWELLING OR PAIN  UNUSUAL VAGINAL DISCHARGE OR ITCHING   Items with * indicate a potential emergency and should be followed up as soon as possible or go to the Emergency Department if any problems should occur.  Please show the CHEMOTHERAPY ALERT CARD or  IMMUNOTHERAPY ALERT CARD at check-in to the Emergency Department and triage nurse.  Should you have questions after your visit or need to cancel or reschedule your appointment, please contact [facility] - A DEPT OF [facility]  Dept: [phone]  and follow the prompts.  Office hours are 8:00 a.m. to 4:30 p.m. Monday - Friday. Please note that voicemails left after 4:00 p.m. may not be returned until the following business day.  We are closed weekends and major holidays. You have access to a nurse at all times for urgent questions. Please call the main number to the clinic Dept: [phone] and follow the prompts.   For any non-urgent questions, you may also contact your provider using MyChart. We now offer e-Visits for anyone 78 and older to request care online for non-urgent symptoms. For details visit [email or web address].   Also download the MyChart app! Go to the app store, search "MyChart", open the app, select [place], and log in with your MyChart username and password.

## 2024-04-22 ENCOUNTER — Other Ambulatory Visit: Payer: Self-pay

## 2024-04-23 ENCOUNTER — Other Ambulatory Visit: Payer: Self-pay

## 2024-04-29 ENCOUNTER — Other Ambulatory Visit: Payer: Self-pay

## 2024-05-03 ENCOUNTER — Ambulatory Visit (HOSPITAL_COMMUNITY)
Admission: RE | Admit: 2024-05-03 | Discharge: 2024-05-03 | Disposition: A | Source: Ambulatory Visit | Attending: Internal Medicine | Admitting: Internal Medicine

## 2024-05-03 DIAGNOSIS — C3490 Malignant neoplasm of unspecified part of unspecified bronchus or lung: Secondary | ICD-10-CM | POA: Insufficient documentation

## 2024-05-04 NOTE — Telephone Encounter (Signed)
 [employer] REFILL AUTHORIZATION REQUEST placed in chart

## 2024-05-07 NOTE — Progress Notes (Signed)
 [facility] OFFICE PROGRESS NOTE  Brandi Crigler, MD [address] [place] [address]  DIAGNOSIS: Metastatic non-small cell lung cancer, adenocarcinoma that was initially diagnosed as stage IIb (T1b, N1, M0) non-small cell lung cancer, adenocarcinoma presented with right upper lobe lung nodule   Detected Alteration(s) / Biomarker(s) Associated FDA-approved therapiesClinical Trial Availability% cfDNA or Amplification BRAF V600E approved by FDA Dabrafenib+trametinib, Encorafenib+binimetinib approved in other indication Cobimetinib, Dabrafenib, Trametinib, Vemurafenib, Vemurafenib+cobimetinib Yes8.3%   BRCA1 R252S None (VUS) None (VUS) 0.1%     SMAD4 D435fs None None 7.2%   PD-L1 expression 35%  PRIOR THERAPY:  1) Status post right upper lobectomy with lymph node dissection on September 30, 2019 in [place] .  She declines adjuvant systemic chemotherapy. 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 Dewey.  CURRENT THERAPY: Systemic chemotherapy with carboplatin  for AUC of 5, Alimta  500 Mg/M2 and Keytruda  200 Mg IV every 3 weeks. First dose April 16, 2022. Status post 35 cycles. Starting cycle #5 she will be on maintenance treatment with Keytruda  every 3 weeks.   INTERVAL HISTORY: Brandi Holmes 73 y.o. female returns to the clinic for a follow-up visit. The patient is feeling fairly today.    She is currently undergoing maintenance treatment with immunotherapy with Keytruda . Alimta  ws discontinued due to CKD.  She has no issues with her recent immunotherapy treatment.  She reports that she is doing well and does not describe any new or worsening symptoms. She denies fevers, chills, night sweats, nausea, vomiting, headaches, or vision changes.  She continues to experience chronic cough with intermittent deep, non-productive episodes, occasionally  accompanied by a whistling sound. She denies new or worsening dyspnea beyond her baseline. She does not use rescue inhalers due to concerns about dependency, referencing her daughter's asthma history. She utilizes an incentive spirometer at home, though she finds it challenging to reach higher target volumes. She has a pulmonary appointment scheduled for next week, which was previously rescheduled due to inclement weather.  She is prescribed lyrica  and tramadol  for chronic back pain since her surgery several years ago. She recently had a restaging CT scan.  She is here for evaluation and repeat blood work before undergoing cycle #36.   MEDICAL HISTORY: Past Medical History:  Diagnosis Date   Anemia    as a child only   Coronary artery calcification of native artery    Hyperlipidemia    Hypertension    Liver spots     per pt   Lung nodule    Wears glasses    Wears partial dentures     ALLERGIES:  is allergic to crab [shellfish allergy] and morphine  and codeine.  MEDICATIONS:  Current Outpatient Medications  Medication Sig Dispense Refill   aspirin  EC 81 MG tablet Take 81 mg by mouth at bedtime. Swallow whole.     atorvastatin  (LIPITOR) 20 MG tablet Take 20 mg by mouth at bedtime.     folic acid  (FOLVITE ) 1 MG tablet Take 1 mg by mouth daily.     furosemide (LASIX) 20 MG tablet Take 20 mg by mouth daily.     lisinopril  (ZESTRIL ) 10 MG tablet Take 1 tablet (10 mg total) by mouth every morning. 90 tablet 0   pregabalin  (LYRICA ) 150 MG capsule Take 150 mg by mouth 2 (two) times daily.     prochlorperazine  (COMPAZINE ) 10 MG tablet Take 1  tablet (10 mg total) by mouth every 6 (six) hours as needed for nausea or vomiting. 30 tablet 0   tiZANidine  (ZANAFLEX ) 2 MG tablet Take 2 mg by mouth at bedtime.     traMADol  HCl 100 MG TABS Take 1 tablet by mouth QID. 1 tablet by mouth every 6 hours as needed for pain     No current facility-administered medications for this visit.    SURGICAL  HISTORY:  Past Surgical History:  Procedure Laterality Date   ABDOMINAL HYSTERECTOMY  1974   heavy bleeding   ABDOMINAL HYSTERECTOMY     BRONCHIAL BRUSHINGS  08/13/2019   Procedure: BRONCHIAL BRUSHINGS;  Surgeon: Brenna Adine CROME, DO;  Location: [facility];  Service: Pulmonary;;   BRONCHIAL WASHINGS  08/13/2019   Procedure: BRONCHIAL WASHINGS;  Surgeon: Brenna Adine CROME, DO;  Location: [facility];  Service: Pulmonary;;   COLONOSCOPY W/ BIOPSIES AND POLYPECTOMY     DILATION AND CURETTAGE OF UTERUS     FINE NEEDLE ASPIRATION  08/13/2019   Procedure: FINE NEEDLE ASPIRATION (FNA) LINEAR;  Surgeon: Brenna Adine CROME, DO;  Location: [facility];  Service: Pulmonary;;   FOOT SURGERY     bilateral bunions and hammer toes   IR RADIOLOGIST EVAL & MGMT  05/20/2022   LUNG BIOPSY  08/13/2019   Procedure: LUNG BIOPSY;  Surgeon: Brenna Adine CROME, DO;  Location: [facility];  Service: Pulmonary;;   MULTIPLE TOOTH EXTRACTIONS     VIDEO BRONCHOSCOPY WITH ENDOBRONCHIAL NAVIGATION Right 08/13/2019   Procedure: VIDEO BRONCHOSCOPY WITH ENDOBRONCHIAL NAVIGATION;  Surgeon: Brenna Adine CROME, DO;  Location: [facility];  Service: Pulmonary;  Laterality: Right;   VIDEO BRONCHOSCOPY WITH ENDOBRONCHIAL ULTRASOUND Right 08/13/2019   Procedure: VIDEO BRONCHOSCOPY WITH ENDOBRONCHIAL ULTRASOUND;  Surgeon: Brenna Adine CROME, DO;  Location: [facility];  Service: Pulmonary;  Laterality: Right;    REVIEW OF SYSTEMS:   Review of Systems  Constitutional: Stable fatigue. Negative for appetite change, chills, fever and unexpected weight change.  HENT: Negative for mouth sores, nosebleeds, sore throat and trouble swallowing.   Eyes: Negative for eye problems and icterus.  Respiratory: Positive for stable dyspnea on exertion and occasional cough. Negative for hemoptysis and wheezing.  Cardiovascular: Negative for chest pain and leg swelling.  Gastrointestinal: Positive for chronic constipation. Negative for abdominal pain,   diarrhea, nausea and vomiting.  Genitourinary: Negative for bladder incontinence, difficulty urinating, dysuria, frequency and hematuria.   Musculoskeletal: Positive for chronic back and right rib pain. Negative for back pain, gait problem, neck pain and neck stiffness.  Skin: Negative for itching and rash. Improved/healing blister on right shin.  Neurological: Negative for dizziness, extremity weakness, gait problem, headaches, light-headedness and seizures.  Hematological: Negative for adenopathy. Does not bruise/bleed easily.  Psychiatric/Behavioral: Negative for confusion, depression and sleep disturbance. The patient is not nervous/anxious.       PHYSICAL EXAMINATION:  Blood pressure 137/76, pulse 81, temperature (!) 97.3 F (36.3 C), temperature source Temporal, resp. rate 18, height 5' 3 (1.6 m), weight 178 lb (80.7 kg), SpO2 99%.  ECOG PERFORMANCE STATUS: 1  Physical Exam  Constitutional: Oriented to person, place, and time and well-developed, well-nourished, and in no distress.   HENT:  Head: Normocephalic and atraumatic.  Mouth/Throat: Oropharynx is clear and moist. No oropharyngeal exudate.  Eyes: Conjunctivae are normal. Right eye exhibits no discharge. Left eye exhibits no discharge. No scleral icterus.  Neck: Normal range of motion. Neck supple.  Cardiovascular: Normal rate, regular rhythm, normal heart sounds and intact distal pulses.  Pulmonary/Chest: Effort normal and breath sounds normal. No respiratory distress. No wheezes. No rales.  Abdominal: Soft. Bowel sounds are normal. Exhibits no distension and no mass. There is no tenderness.  Musculoskeletal: Tenderness to palpation of upper back. Normal range of motion. Exhibits muscle wasting.  Lymphadenopathy:    No cervical adenopathy.  Neurological: Alert and oriented to person, place, and time. Exhibits muscle wasting. Skin: Skin is warm and dry. No rash noted. Not diaphoretic. No erythema. No pallor.  Psychiatric:  Mood, memory and judgment normal.  Vitals reviewed.  LABORATORY DATA: Lab Results  Component Value Date   WBC 11.3 (H) 05/12/2024   HGB 13.2 05/12/2024   HCT 41.4 05/12/2024   MCV 91.4 05/12/2024   PLT 195 05/12/2024      Chemistry      Component Value Date/Time   NA 143 05/12/2024 1356   NA 142 07/22/2019 1522   K 4.4 05/12/2024 1356   CL 107 05/12/2024 1356   CO2 26 05/12/2024 1356   BUN 25 (H) 05/12/2024 1356   BUN 13 07/22/2019 1522   CREATININE 1.44 (H) 05/12/2024 1356   CREATININE 0.91 09/16/2011 1000      Component Value Date/Time   CALCIUM  9.6 05/12/2024 1356   ALKPHOS 97 05/12/2024 1356   AST 29 05/12/2024 1356   ALT 18 05/12/2024 1356   BILITOT 0.6 05/12/2024 1356       RADIOGRAPHIC STUDIES:  CT CHEST ABDOMEN PELVIS WO CONTRAST Result Date: 05/08/2024 CLINICAL DATA:  Restaging non-small cell lung cancer. * Tracking Code: BO * EXAM: CT CHEST, ABDOMEN AND PELVIS WITHOUT CONTRAST TECHNIQUE: Multidetector CT imaging of the chest, abdomen and pelvis was performed following the standard protocol without IV contrast. RADIATION DOSE REDUCTION: This exam was performed according to the departmental dose-optimization program which includes automated exposure control, adjustment of the mA and/or kV according to patient size and/or use of iterative reconstruction technique. COMPARISON:  Multiple prior imaging studies. The most recent CT scan is 01/08/2024 FINDINGS: CT CHEST FINDINGS Cardiovascular: The heart is normal in size. Stable small pericardial effusion. Stable tortuosity and calcification of the thoracic aorta. Mediastinum/Nodes: No mediastinal or hilar mass or lymphadenopathy pre the esophagus is grossly normal. Stable gland. Lungs/Pleura: Stable surgical and radiation changes involving the right hemithorax. Prior right lobe lobectomy. No findings suspicious for recurrent tumor. No acute pulmonary process. No worrisome pulmonary lesions or pulmonary nodules.  Musculoskeletal: No breast masses, supraclavicular axillary adenopathy. Stable sclerotic metastatic bone disease. No new progressive findings. Stable mid and lower thoracic compression fractures. CT ABDOMEN PELVIS FINDINGS Hepatobiliary: No hepatic lesions are identified contrast. Stable scattered hepatic cysts no biliary dilatation. The gallbladder is unremarkable Pancreas: Unremarkable. No pancreatic ductal dilatation or surrounding inflammatory changes. Spleen: Normal in size without focal abnormality. Adrenals/Urinary Tract: The adrenal glands and kidneys are unremarkable and stable. Right renal calculus unchanged. The bladder is normal. Stomach/Bowel: The stomach, duodenum, small bowel and colon are grossly normal without oral contrast. No inflammatory changes, mass lesions or obstructive findings. Vascular/Lymphatic: Stable advanced atherosclerotic calcifications involving the aorta and branch vessels but no aneurysm. No mesenteric retro mass or adenopathy no adenopathy Reproductive: Surgically absent. Other: No pelvic mass or adenopathy. No free pelvic fluid collections. No inguinal mass or adenopathy. No abdominal wall hernia or subcutaneous lesions. Musculoskeletal: Stable sclerotic changes involving the L3 and L5 vertebral bodies and stable advanced lower lumbar facet disease. IMPRESSION: 1. Stable surgical and radiation changes involving the right hemithorax. No findings suspicious for recurrent tumor. 2. Stable  sclerotic metastatic bone disease. No new or progressive findings. 3. No findings for abdominal/pelvic metastatic disease. 4. Stable advanced atherosclerotic calcifications involving the thoracic and abdominal aorta and branch vessels. 5. Aortic atherosclerosis. Aortic Atherosclerosis (ICD10-I70.0). Electronically Signed   By: MYRTIS Stammer M.D.   On: 05/08/2024 19:50     ASSESSMENT/PLAN:  This is a very pleasant 73 year old African-American female diagnosed with metastatic non-small cell  lung cancer, adenocarcinoma.  She was initially diagnosed as a stage IIb (T1b, N1, M0).  She is status post right upper lobectomy with lymph node dissection on September 30, 2019 in [place] .  She declined adjuvant systemic chemotherapy at that time.   She is found to have metastatic disease to the bone in November 2023.   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   She underwent palliative radiation to T9 under the care of Dr. Dewey.   She is currently on systemic therapy.  She was started on systemic chemotherapy with carboplatin  for an AUC of 5, Alimta  500 mg/m, and Keytruda  200 mg IV every 3 weeks.  She is status post 35 cycles.  Starting from cycle #5, she started maintenance treatment with immunotherapy with Keytruda  only.  Alimta  was discontinued secondary to renal insufficiency.   The patient was seen with Dr. Sherrod today.  Dr. Sherrod personally and independently reviewed the scan and discussed results with the patient today.  The scan showed no evidence of disease progression.  She completed two years of treatment.   Dr. Sherrod recommends she continue on observation with restaging CT scan in 3 months.   We will see her a week later to review the results in the office.    She will continue lyrica  and tramadol  for her chronic back pain. She also uses heating pad.    The patient was advised to call immediately if she has any concerning symptoms in the interval. The patient voices understanding of current disease status and treatment options and is in agreement with the current care plan. All questions were answered. The patient knows to call the clinic with any problems, questions or concerns. We can certainly see the patient much sooner if necessary    Orders Placed This Encounter  Procedures   CT CHEST ABDOMEN PELVIS WO CONTRAST    Standing Status:   Future    Expected Date:   08/10/2024    Expiration Date:   05/12/2025    Preferred  imaging location?:   [facility]    If indicated for the ordered procedure, I authorize the administration of oral contrast media per Radiology protocol:   No    Reason for no oral contrast::   CKD   CBC with Differential (Cancer Center Only)    Standing Status:   Future    Expected Date:   08/10/2024    Expiration Date:   05/12/2025   CMP (Cancer Center only)    Standing Status:   Future    Expected Date:   08/10/2024    Expiration Date:   05/12/2025      Nana Vastine L Khaden Gater, PA-C 05/12/24  ADDENDUM: Hematology/Oncology Attending:  I had a face-to-face encounter with the patient today.  I reviewed her records, lab, scan and recommended her care plan.  This is a very pleasant 74 years old female with metastatic non-small cell lung cancer, adenocarcinoma with positive BRAF V600 E mutation and PD-L1 expression of 35%.  She completed a course  of systemic chemoimmunotherapy initially with carboplatin , Alimta  and Keytruda  every 3 weeks for 4 cycles followed by maintenance treatment with single agent Keytruda  for 35 cycles completed 3 weeks ago.  The patient has tolerated her previous treatment fairly well.  She had repeat CT scan of the chest, abdomen and pelvis performed recently.  I personally independently reviewed the scan and discussed the results with the patient today.  Her scan showed no concerning findings for disease progression. I recommended for her to continue on observation from now on. I will see her back for follow-up visit in 3 months for evaluation with repeat CT scan of the chest, abdomen pelvis for restaging of her disease.  If she develop any disease progression in the future, we will consider her for treatment with either resuming chemoimmunotherapy or targeted therapy with BRAF mutation inhibitor. The patient was advised to call immediately if she has any concerning symptoms in the interval. Disclaimer: This note was dictated with voice recognition software.  Similar sounding words can inadvertently be transcribed and may be missed upon review. Cottrell Gentles L Samoria Fedorko, PA-C

## 2024-05-10 ENCOUNTER — Ambulatory Visit: Admitting: Adult Health

## 2024-05-12 ENCOUNTER — Inpatient Hospital Stay: Admitting: Physician Assistant

## 2024-05-12 ENCOUNTER — Inpatient Hospital Stay

## 2024-05-12 VITALS — BP 137/76 | HR 81 | Temp 97.3°F | Resp 18 | Ht 63.0 in | Wt 178.0 lb

## 2024-05-12 DIAGNOSIS — Z5112 Encounter for antineoplastic immunotherapy: Secondary | ICD-10-CM | POA: Diagnosis not present

## 2024-05-12 DIAGNOSIS — C7951 Secondary malignant neoplasm of bone: Secondary | ICD-10-CM

## 2024-05-12 LAB — CBC WITH DIFFERENTIAL (CANCER CENTER ONLY)
Abs Immature Granulocytes: 0.02 10*3/uL (ref 0.00–0.07)
Basophils Absolute: 0.1 10*3/uL (ref 0.0–0.1)
Basophils Relative: 1 %
Eosinophils Absolute: 2.1 10*3/uL — ABNORMAL HIGH (ref 0.0–0.5)
Eosinophils Relative: 19 %
HCT: 41.4 % (ref 36.0–46.0)
Hemoglobin: 13.2 g/dL (ref 12.0–15.0)
Immature Granulocytes: 0 %
Lymphocytes Relative: 22 %
Lymphs Abs: 2.5 10*3/uL (ref 0.7–4.0)
MCH: 29.1 pg (ref 26.0–34.0)
MCHC: 31.9 g/dL (ref 30.0–36.0)
MCV: 91.4 fL (ref 80.0–100.0)
Monocytes Absolute: 0.9 10*3/uL (ref 0.1–1.0)
Monocytes Relative: 8 %
Neutro Abs: 5.7 10*3/uL (ref 1.7–7.7)
Neutrophils Relative %: 50 %
Platelet Count: 195 10*3/uL (ref 150–400)
RBC: 4.53 MIL/uL (ref 3.87–5.11)
RDW: 13.2 % (ref 11.5–15.5)
WBC Count: 11.3 10*3/uL — ABNORMAL HIGH (ref 4.0–10.5)
nRBC: 0 % (ref 0.0–0.2)

## 2024-05-12 LAB — CMP (CANCER CENTER ONLY)
ALT: 18 U/L (ref 0–44)
AST: 29 U/L (ref 15–41)
Albumin: 4.1 g/dL (ref 3.5–5.0)
Alkaline Phosphatase: 97 U/L (ref 38–126)
Anion gap: 10 (ref 5–15)
BUN: 25 mg/dL — ABNORMAL HIGH (ref 8–23)
CO2: 26 mmol/L (ref 22–32)
Calcium: 9.6 mg/dL (ref 8.9–10.3)
Chloride: 107 mmol/L (ref 98–111)
Creatinine: 1.44 mg/dL — ABNORMAL HIGH (ref 0.44–1.00)
GFR, Estimated: 38 mL/min — ABNORMAL LOW
Glucose, Bld: 90 mg/dL (ref 70–99)
Potassium: 4.4 mmol/L (ref 3.5–5.1)
Sodium: 143 mmol/L (ref 135–145)
Total Bilirubin: 0.6 mg/dL (ref 0.0–1.2)
Total Protein: 7.3 g/dL (ref 6.5–8.1)

## 2024-05-17 ENCOUNTER — Ambulatory Visit: Admitting: Adult Health

## 2024-05-20 ENCOUNTER — Encounter: Payer: Self-pay | Admitting: Adult Health

## 2024-05-20 ENCOUNTER — Ambulatory Visit: Admitting: Adult Health

## 2024-05-20 VITALS — BP 110/60 | HR 74 | Temp 98.0°F | Ht 63.0 in | Wt 176.0 lb

## 2024-05-20 DIAGNOSIS — R0602 Shortness of breath: Secondary | ICD-10-CM

## 2024-05-20 DIAGNOSIS — J479 Bronchiectasis, uncomplicated: Secondary | ICD-10-CM

## 2024-05-20 DIAGNOSIS — G8929 Other chronic pain: Secondary | ICD-10-CM

## 2024-05-20 DIAGNOSIS — Z87891 Personal history of nicotine dependence: Secondary | ICD-10-CM

## 2024-05-20 DIAGNOSIS — C7951 Secondary malignant neoplasm of bone: Secondary | ICD-10-CM

## 2024-05-20 DIAGNOSIS — J439 Emphysema, unspecified: Secondary | ICD-10-CM

## 2024-05-20 DIAGNOSIS — C3491 Malignant neoplasm of unspecified part of right bronchus or lung: Secondary | ICD-10-CM

## 2024-05-20 DIAGNOSIS — C3411 Malignant neoplasm of upper lobe, right bronchus or lung: Secondary | ICD-10-CM

## 2024-05-20 DIAGNOSIS — R0609 Other forms of dyspnea: Secondary | ICD-10-CM

## 2024-05-20 MED ORDER — STIOLTO RESPIMAT 2.5-2.5 MCG/ACT IN AERS: 2.0000 | INHALATION_SPRAY | Freq: Every day | RESPIRATORY_TRACT | 5 refills | Status: AC

## 2024-05-20 NOTE — Patient Instructions (Addendum)
 Begin Stiolto 2 puffs daily, rinse after use Set up for PFT  Keep follow up and CT with Oncology as planned.  Discuss with Primary Provider that Lisinopril  may aggravate your cough  Delsym 2 tsp Twice daily As needed  for cough  Follow up with Dr. Pleas or Granvil Djordjevic NP and As needed  in 6-8 weeks  Please contact office for sooner follow up if symptoms do not improve or worsen or seek emergency care

## 2024-05-20 NOTE — Progress Notes (Unsigned)
 "  @Patient  ID: Brandi Holmes, female    DOB: 1950/10/19, 74 y.o.   MRN: [id number]  Chief Complaint  Patient presents with   Follow-up    Emphysema     Referring provider: [name], MD  HPI: 74 year old female former smoker followed for metastatic lung cancer  Navigational bronchoscopy 2021 with tissue diagnosis of upper lung lung mass that was positive for adenocarcinoma-seen by thoracic surgeon in [facility]  underwent right upper lobectomy diagnosed T1b, N1 M0 malignancy with 3 positive nodes declined adjunct of chemotherapy. 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 First dose April 16, 2022. Status post 35 cycles. Starting cycle #5 she will be on maintenance treatment with Keytruda  every 3 weeks.  Alimta  discontinued due to chronic kidney disease    TEST/EVENTS : Reviewed 05/20/2024  CT chest Aug 13, 2021 status post right upper lobectomy no evidence of recurrent or metastatic disease unchanged pretracheal lymph nodes, mild emphysema   CT chest May 03, 2024 showed stable surgical and radiation changes involving the right hemothorax, prior right lobectomy, no findings suspicious for recurrent tumor, no acute pulmonary process or worrisome nodules, stable sclerotic changes of L3 and L5  CT chest November 2023 showed no evidence of PE, stable right upper lobe changes, stable left upper lobe nodule, new metastatic disease T9 with soft tissue extension, worsening osseous metastasis and stable right renal mass worrisome for renal cell carcinoma  PET scan April 05, 2022 showed hypermetabolic metastasis T9, L3-L5    Discussed the use of AI scribe software for clinical note transcription with the patient, who gave verbal consent to proceed.  History of Present Illness Brandi  Sharunda Salmon Holmes is a 74 year old female with lung cancer who presents with shortness of breath and chronic pain. Last seen in the office 03/2022.   She has a history of metastatic lung cancer and has undergone surgical resection, chemotherapy, radiation, and immunotherapy. Recently completed all active therapy. She experiences persistent shortness of breath, particularly when talking or walking short distances, which began after her surgery over last few years. Seems to be getting progressively worse.  She experiences chronic pain, which she attributes to nerve pain and soreness in her back, particularly in the middle where she believes the mets tumor was located. She has XRT to back Mets. She also reports soreness in her ribs, which she describes as a result of rib deformity from displacement during healing. Her back is sore to touch, and she sleeps on a long, flat, cushy pillow to get comfortable.  She has a history of smoking for fifty years but reports never having had a cold or flu. She mentions that her right lung is scarred. No prior lung problems such as COPD, asthma, or bronchitis before her cancer diagnosis.  She reports a chronic cough that worsens when she talks, and she has been on lisinopril  for blood pressure management for twenty years. She uses over-the-counter medication for her cough.  Her legs swelled significantly after her first dose of chemotherapy but have since improved.SABRA Despite all the above she remains very active and independent.   No hemoptysis, chest pain, orthopnea , edema or calf pain. No history of VTE .      Allergies[1]  Immunization History  Administered Date(s) Administered   Moderna Sars-Covid-2  Vaccination 07/15/2019, 08/09/2019    Past Medical History:  Diagnosis Date   Anemia    as a child only   Coronary artery calcification of native artery    Hyperlipidemia    Hypertension    Liver spots     per pt   Lung nodule    Wears glasses     Wears partial dentures     Tobacco History: Tobacco Use History[2] Counseling given: Not Answered   Outpatient Medications Prior to Visit  Medication Sig Dispense Refill   aspirin  EC 81 MG tablet Take 81 mg by mouth at bedtime. Swallow whole.     atorvastatin  (LIPITOR) 20 MG tablet Take 20 mg by mouth at bedtime.     folic acid  (FOLVITE ) 1 MG tablet Take 1 mg by mouth daily.     furosemide (LASIX) 20 MG tablet Take 20 mg by mouth daily.     pregabalin  (LYRICA ) 150 MG capsule Take 150 mg by mouth 2 (two) times daily.     tiZANidine  (ZANAFLEX ) 2 MG tablet Take 2 mg by mouth at bedtime.     traMADol  HCl 100 MG TABS Take 1 tablet by mouth QID. 1 tablet by mouth every 6 hours as needed for pain     lisinopril  (ZESTRIL ) 10 MG tablet Take 1 tablet (10 mg total) by mouth every morning. 90 tablet 0   prochlorperazine  (COMPAZINE ) 10 MG tablet Take 1 tablet (10 mg total) by mouth every 6 (six) hours as needed for nausea or vomiting. 30 tablet 0   No facility-administered medications prior to visit.     Review of Systems:   Constitutional:   No  weight loss, night sweats,  Fevers, chills, +fatigue, or  lassitude.  HEENT:   No headaches,  Difficulty swallowing,  Tooth/dental problems, or  Sore throat,                No sneezing, itching, ear ache, nasal congestion, post nasal drip,   CV:  No chest pain,  Orthopnea, PND, swelling in lower extremities, anasarca, dizziness, palpitations, syncope.   GI  No heartburn, indigestion, abdominal pain, nausea, vomiting, diarrhea, change in bowel habits, loss of appetite, bloody stools.   Resp: .  No chest wall deformity  Skin: no rash or lesions.  GU: no dysuria, change in color of urine, no urgency or frequency.  No flank pain, no hematuria   MS:  No joint pain or swelling.  No decreased range of motion.  No back pain.    Physical Exam  BP 110/60 (BP Location: Left Arm, Patient Position: Sitting)   Pulse 74   Temp 98 F (36.7 C) (Oral)    Ht 5' 3 (1.6 m)   Wt 176 lb (79.8 kg)   SpO2 96%   BMI 31.18 kg/m   GEN: A/Ox3; pleasant , NAD, well nourished    HEENT:  [place]/AT,  , NOSE-clear, THROAT-clear, no lesions, no postnasal drip or exudate noted.   NECK:  Supple w/ fair ROM; no JVD; normal carotid impulses w/o bruits; no thyromegaly or nodules palpated; no lymphadenopathy.    RESP  Clear  P & A; w/o, wheezes/ rales/ or rhonchi. no accessory muscle use, no dullness to percussion  CARD:  RRR, no m/r/g, no peripheral edema, pulses intact, no cyanosis or clubbing.  GI:   Soft & nt; nml bowel sounds; no organomegaly or masses detected.   Musco: Warm bil, no deformities or joint swelling noted.   Neuro: alert, no focal deficits  noted.    Skin: Warm, no lesions or rashes    Lab Results:Reviewed 05/20/2024   CBC    Component Value Date/Time   WBC 11.3 (H) 05/12/2024 1356   WBC 12.6 (H) 09/09/2022 0434   RBC 4.53 05/12/2024 1356   HGB 13.2 05/12/2024 1356   HCT 41.4 05/12/2024 1356   PLT 195 05/12/2024 1356   MCV 91.4 05/12/2024 1356   MCH 29.1 05/12/2024 1356   MCHC 31.9 05/12/2024 1356   RDW 13.2 05/12/2024 1356   LYMPHSABS 2.5 05/12/2024 1356   MONOABS 0.9 05/12/2024 1356   EOSABS 2.1 (H) 05/12/2024 1356   BASOSABS 0.1 05/12/2024 1356    BMET    Component Value Date/Time   NA 143 05/12/2024 1356   NA 142 07/22/2019 1522   K 4.4 05/12/2024 1356   CL 107 05/12/2024 1356   CO2 26 05/12/2024 1356   GLUCOSE 90 05/12/2024 1356   BUN 25 (H) 05/12/2024 1356   BUN 13 07/22/2019 1522   CREATININE 1.44 (H) 05/12/2024 1356   CREATININE 0.91 09/16/2011 1000   CALCIUM  9.6 05/12/2024 1356   GFRNONAA 38 (L) 05/12/2024 1356   GFRAA >60 08/13/2019 0605    BNP No results found for: BNP  ProBNP No results found for: PROBNP  Imaging: CT CHEST ABDOMEN PELVIS WO CONTRAST Result Date: 05/08/2024 CLINICAL DATA:  Restaging non-small cell lung cancer. * Tracking Code: BO * EXAM: CT CHEST, ABDOMEN AND PELVIS  WITHOUT CONTRAST TECHNIQUE: Multidetector CT imaging of the chest, abdomen and pelvis was performed following the standard protocol without IV contrast. RADIATION DOSE REDUCTION: This exam was performed according to the departmental dose-optimization program which includes automated exposure control, adjustment of the mA and/or kV according to patient size and/or use of iterative reconstruction technique. COMPARISON:  Multiple prior imaging studies. The most recent CT scan is 01/08/2024 FINDINGS: CT CHEST FINDINGS Cardiovascular: The heart is normal in size. Stable small pericardial effusion. Stable tortuosity and calcification of the thoracic aorta. Mediastinum/Nodes: No mediastinal or hilar mass or lymphadenopathy pre the esophagus is grossly normal. Stable gland. Lungs/Pleura: Stable surgical and radiation changes involving the right hemithorax. Prior right lobe lobectomy. No findings suspicious for recurrent tumor. No acute pulmonary process. No worrisome pulmonary lesions or pulmonary nodules. Musculoskeletal: No breast masses, supraclavicular axillary adenopathy. Stable sclerotic metastatic bone disease. No new progressive findings. Stable mid and lower thoracic compression fractures. CT ABDOMEN PELVIS FINDINGS Hepatobiliary: No hepatic lesions are identified contrast. Stable scattered hepatic cysts no biliary dilatation. The gallbladder is unremarkable Pancreas: Unremarkable. No pancreatic ductal dilatation or surrounding inflammatory changes. Spleen: Normal in size without focal abnormality. Adrenals/Urinary Tract: The adrenal glands and kidneys are unremarkable and stable. Right renal calculus unchanged. The bladder is normal. Stomach/Bowel: The stomach, duodenum, small bowel and colon are grossly normal without oral contrast. No inflammatory changes, mass lesions or obstructive findings. Vascular/Lymphatic: Stable advanced atherosclerotic calcifications involving the aorta and branch vessels but no  aneurysm. No mesenteric retro mass or adenopathy no adenopathy Reproductive: Surgically absent. Other: No pelvic mass or adenopathy. No free pelvic fluid collections. No inguinal mass or adenopathy. No abdominal wall hernia or subcutaneous lesions. Musculoskeletal: Stable sclerotic changes involving the L3 and L5 vertebral bodies and stable advanced lower lumbar facet disease. IMPRESSION: 1. Stable surgical and radiation changes involving the right hemithorax. No findings suspicious for recurrent tumor. 2. Stable sclerotic metastatic bone disease. No new or progressive findings. 3. No findings for abdominal/pelvic metastatic disease. 4. Stable advanced atherosclerotic calcifications involving the  thoracic and abdominal aorta and branch vessels. 5. Aortic atherosclerosis. Aortic Atherosclerosis (ICD10-I70.0). Electronically Signed   By: MYRTIS Stammer M.D.   On: 05/08/2024 19:50    pembrolizumab  (KEYTRUDA ) 200 mg in sodium chloride  0.9 % 50 mL chemo infusion     Date Action Dose Route User   03/26/2024 1624 Rate/Dose Change (none) Intravenous Oraegbunam, Ifeoma K, RN   03/26/2024 1623 Rate/Dose Change (none) Intravenous Oraegbunam, Ifeoma K, RN   03/26/2024 1553 New Bag/Given 200 mg Intravenous Oraegbunam, Ifeoma K, RN      pembrolizumab  (KEYTRUDA ) 200 mg in sodium chloride  0.9 % 50 mL chemo infusion     Date Action Dose Route User   04/19/2024 1629 Rate/Dose Change (none) Intravenous Erie Almarie BROCKS, RN   04/19/2024 1628 New Bag/Given 200 mg Intravenous Erie Almarie BROCKS, RN      0.9 %  sodium chloride  infusion     Date Action Dose Route User   03/26/2024 1652 Rate/Dose Change (none) Intravenous Oraegbunam, Ifeoma K, RN   03/26/2024 1649 Rate/Dose Change (none) Intravenous Oraegbunam, Ifeoma K, RN   03/26/2024 1539 New Bag/Given (none) Intravenous Oraegbunam, Ifeoma K, RN      0.9 %  sodium chloride  infusion     Date Action Dose Route User   04/19/2024 1705 Rate/Dose Change (none)  Intravenous Erie Almarie BROCKS, RN   04/19/2024 1702 Rate/Dose Change (none) Intravenous Erie Almarie BROCKS, RN   04/19/2024 1605 New Bag/Given (none) Intravenous Erie Almarie BROCKS, RN           No data to display          No results found for: NITRICOXIDE      No data to display              Assessment & Plan:   Assessment and Plan Assessment & Plan Chronic dyspnea likely due to low lung capacity due to previous lung resection, scarring /bronchiectasis . Lisinopril  may contribute to cough but not directly to dyspnea. Differential includes COPD and bronchiectasis. Ordered a pulmonary function test to assess lung function. Recent labs with no significant anemia. Prescribed Stiolto inhaler to improve airway patency. Advised discussing with her primary care provider about discontinuing lisinopril  due to potential cough exacerbation. Recommended over-the-counter Delsym for cough management.   History of Metastatic lung  cancer, status post surgery, chemotherapy, and immunotherapy   Right lung cancer was treated with surgery, chemotherapy, and immunotherapy. A recent CT scan shows stable post-treatment changes with no new areas of concern. The oncology team plans to repeat the CT in three months. Continue follow-up with oncology for the CT scan in three months.  Emphysema and bronchiectasis   Likely secondary to previous smoking history and ? Radiation contributing to chronic cough and dyspnea. Ordered a pulmonary function test to assess lung function. Prescribed Stiolto inhaler to improve airway patency and symptom management .   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  SABRA Pain management is ongoing with current medications. Continue the current pain management regimen.   Plan  Patient Instructions  Begin Stiolto 2 puffs daily, rinse after use Set up for PFT  Keep follow up and CT with Oncology as planned.  Discuss with Primary Provider that Lisinopril  may  aggravate your cough  Delsym 2 tsp Twice daily As needed  for cough  Follow up with Dr. Pleas or Merelin Human NP and As needed  in 6-8 weeks  Please contact office for sooner follow up if symptoms do not improve  or worsen or seek emergency care          Artha Chiasson, NP 05/20/2024  I spent   minutes dedicated to the care of this patient on the date of this encounter to include pre-visit review of records, face-to-face time with the patient discussing conditions above, post visit ordering of testing, clinical documentation with the electronic health record, making appropriate referrals as documented, and communicating necessary findings to members of the patients care team.      [1]  Allergies Allergen Reactions   Crab [Shellfish Allergy] Swelling    Swelling of feet   Morphine  And Codeine Other (See Comments)    Nightmares   [2]  Social History Tobacco Use  Smoking Status Former   Current packs/day: 0.00   Average packs/day: 0.3 packs/day for 50.0 years (15.0 ttl pk-yrs)   Types: Cigarettes   Start date: 45   Quit date: 2016   Years since quitting: 10.1  Smokeless Tobacco Never   "

## 2024-06-03 ENCOUNTER — Inpatient Hospital Stay

## 2024-06-03 ENCOUNTER — Inpatient Hospital Stay: Admitting: Internal Medicine

## 2024-06-21 ENCOUNTER — Inpatient Hospital Stay

## 2024-06-21 ENCOUNTER — Inpatient Hospital Stay: Admitting: Physician Assistant

## 2024-06-24 ENCOUNTER — Inpatient Hospital Stay: Admitting: Physician Assistant

## 2024-06-24 ENCOUNTER — Inpatient Hospital Stay

## 2024-07-15 ENCOUNTER — Inpatient Hospital Stay

## 2024-07-15 ENCOUNTER — Inpatient Hospital Stay: Admitting: Internal Medicine

## 2024-07-19 ENCOUNTER — Ambulatory Visit: Admitting: Adult Health

## 2024-07-19 ENCOUNTER — Encounter

## 2024-08-10 ENCOUNTER — Inpatient Hospital Stay: Attending: Physician Assistant

## 2024-08-18 ENCOUNTER — Inpatient Hospital Stay: Attending: Physician Assistant | Admitting: Internal Medicine
